# Patient Record
Sex: Female | Born: 1955
Health system: Southern US, Community
[De-identification: ages and names within clinical notes are randomized; demographics above are authoritative.]

## PROBLEM LIST (undated history)

## (undated) ENCOUNTER — Emergency Department (HOSPITAL_COMMUNITY): Admission: EM | Payer: Medicare Other | Source: Home / Self Care

## (undated) DIAGNOSIS — M549 Dorsalgia, unspecified: Secondary | ICD-10-CM

## (undated) DIAGNOSIS — I639 Cerebral infarction, unspecified: Secondary | ICD-10-CM

## (undated) DIAGNOSIS — M1990 Unspecified osteoarthritis, unspecified site: Secondary | ICD-10-CM

## (undated) DIAGNOSIS — E119 Type 2 diabetes mellitus without complications: Secondary | ICD-10-CM

## (undated) DIAGNOSIS — G8929 Other chronic pain: Secondary | ICD-10-CM

## (undated) DIAGNOSIS — F329 Major depressive disorder, single episode, unspecified: Secondary | ICD-10-CM

## (undated) DIAGNOSIS — F419 Anxiety disorder, unspecified: Secondary | ICD-10-CM

## (undated) DIAGNOSIS — I10 Essential (primary) hypertension: Secondary | ICD-10-CM

## (undated) DIAGNOSIS — K219 Gastro-esophageal reflux disease without esophagitis: Secondary | ICD-10-CM

## (undated) DIAGNOSIS — F0390 Unspecified dementia without behavioral disturbance: Secondary | ICD-10-CM

## (undated) DIAGNOSIS — G709 Myoneural disorder, unspecified: Secondary | ICD-10-CM

## (undated) DIAGNOSIS — IMO0001 Reserved for inherently not codable concepts without codable children: Secondary | ICD-10-CM

## (undated) DIAGNOSIS — F319 Bipolar disorder, unspecified: Secondary | ICD-10-CM

## (undated) DIAGNOSIS — F32A Depression, unspecified: Secondary | ICD-10-CM

## (undated) DIAGNOSIS — R51 Headache: Secondary | ICD-10-CM

## (undated) DIAGNOSIS — R519 Headache, unspecified: Secondary | ICD-10-CM

## (undated) HISTORY — DX: Headache: R51

## (undated) HISTORY — DX: Gastro-esophageal reflux disease without esophagitis: K21.9

## (undated) HISTORY — DX: Unspecified osteoarthritis, unspecified site: M19.90

## (undated) HISTORY — DX: Headache, unspecified: R51.9

## (undated) HISTORY — DX: Cerebral infarction, unspecified: I63.9

## (undated) HISTORY — PX: ABDOMINAL HYSTERECTOMY: SHX81

## (undated) HISTORY — PX: GANGLION CYST EXCISION: SHX1691

## (undated) HISTORY — DX: Other chronic pain: G89.29

## (undated) HISTORY — PX: TONSILLECTOMY: SUR1361

---

## 2007-03-16 ENCOUNTER — Encounter: Admission: RE | Admit: 2007-03-16 | Discharge: 2007-03-16 | Payer: Self-pay | Admitting: Occupational Medicine

## 2007-10-29 ENCOUNTER — Emergency Department: Payer: Self-pay | Admitting: Emergency Medicine

## 2007-11-18 ENCOUNTER — Encounter: Admission: RE | Admit: 2007-11-18 | Discharge: 2008-01-25 | Payer: Self-pay | Admitting: Internal Medicine

## 2008-02-10 ENCOUNTER — Encounter: Admission: RE | Admit: 2008-02-10 | Discharge: 2008-05-10 | Payer: Self-pay | Admitting: Internal Medicine

## 2009-06-27 ENCOUNTER — Ambulatory Visit (HOSPITAL_BASED_OUTPATIENT_CLINIC_OR_DEPARTMENT_OTHER)
Admission: RE | Admit: 2009-06-27 | Discharge: 2009-06-28 | Payer: Self-pay | Source: Home / Self Care | Admitting: Internal Medicine

## 2009-06-28 ENCOUNTER — Ambulatory Visit: Payer: Self-pay | Admitting: Diagnostic Radiology

## 2012-11-28 ENCOUNTER — Emergency Department (HOSPITAL_COMMUNITY)
Admission: EM | Admit: 2012-11-28 | Discharge: 2012-11-28 | Disposition: A | Discharge status: Home / Self Care | Payer: Self-pay | Attending: Emergency Medicine | Admitting: Emergency Medicine

## 2012-11-28 ENCOUNTER — Encounter (HOSPITAL_COMMUNITY): Payer: Self-pay | Admitting: Emergency Medicine

## 2012-11-28 DIAGNOSIS — Z203 Contact with and (suspected) exposure to rabies: Secondary | ICD-10-CM

## 2012-11-28 DIAGNOSIS — W540XXA Bitten by dog, initial encounter: Secondary | ICD-10-CM | POA: Insufficient documentation

## 2012-11-28 DIAGNOSIS — I10 Essential (primary) hypertension: Secondary | ICD-10-CM | POA: Insufficient documentation

## 2012-11-28 DIAGNOSIS — S81009A Unspecified open wound, unspecified knee, initial encounter: Secondary | ICD-10-CM | POA: Insufficient documentation

## 2012-11-28 DIAGNOSIS — Z23 Encounter for immunization: Secondary | ICD-10-CM | POA: Insufficient documentation

## 2012-11-28 DIAGNOSIS — Y9289 Other specified places as the place of occurrence of the external cause: Secondary | ICD-10-CM | POA: Insufficient documentation

## 2012-11-28 DIAGNOSIS — S81852A Open bite, left lower leg, initial encounter: Secondary | ICD-10-CM

## 2012-11-28 DIAGNOSIS — Y9389 Activity, other specified: Secondary | ICD-10-CM | POA: Insufficient documentation

## 2012-11-28 HISTORY — DX: Essential (primary) hypertension: I10

## 2012-11-28 MED ORDER — RABIES IMMUNE GLOBULIN 150 UNIT/ML IM INJ
20.0000 [IU]/kg | INJECTION | Freq: Once | INTRAMUSCULAR | Status: AC
  Administered 2012-11-28: 2325 [IU] via INTRAMUSCULAR
  Filled 2012-11-28: qty 15.5

## 2012-11-28 MED ORDER — RABIES VACCINE, PCEC IM SUSR
1.0000 mL | Freq: Once | INTRAMUSCULAR | Status: AC
  Administered 2012-11-28: 1 mL via INTRAMUSCULAR
  Filled 2012-11-28: qty 1

## 2012-11-28 MED ORDER — TETANUS-DIPHTH-ACELL PERTUSSIS 5-2.5-18.5 LF-MCG/0.5 IM SUSP
0.5000 mL | Freq: Once | INTRAMUSCULAR | Status: AC
  Administered 2012-11-28: 0.5 mL via INTRAMUSCULAR
  Filled 2012-11-28: qty 0.5

## 2012-11-28 NOTE — ED Provider Notes (Signed)
Medical screening examination/treatment/procedure(s) were performed by non-physician practitioner and as supervising physician I was immediately available for consultation/collaboration.  EKG Interpretation   None        Geoffery Lyons, MD 11/28/12 1540

## 2012-11-28 NOTE — ED Notes (Signed)
She states she was "nipped" by a dog.  She states, as was verified by EMT, that police were notified; and will come to hospital to make a report.  She states the dog is a small dog, which is a family pet--not a stray.  She has sm. Red line (no broken skin) on back of lat. left calf.  She is in no distress.

## 2012-11-28 NOTE — ED Notes (Signed)
She reports no itching, nor any other reaction to injections.  She states police were notified, and were at scene at [facility] where this occurred.  She states she has no address or phone to give animal control, "but they know who I am and where to find me".

## 2012-11-28 NOTE — ED Provider Notes (Signed)
CSN: [id number]     Arrival date & time 11/28/12  1102 History   First MD Initiated Contact with Patient 11/28/12 1109     Chief Complaint  Patient presents with  . Animal Bite   (Consider location/radiation/quality/duration/timing/severity/associated sxs/prior Treatment) HPI Comments: Patient here after having been bit by unknown dog while working in the neighborhood.  She reports that this unprovoked attack, she reports being nipped on the left lateral calf.  No bleeding present, mild swelling at the site with small puncture marks.  She does not know her tetanus status.  Patient is a 57 y.o. female presenting with animal bite. The history is provided by the patient. No language interpreter was used.  Animal Bite Contact animal:  Dog Location:  Leg Leg injury location:  L lower leg Time since incident:  1 hour Pain details:    Quality:  Aching   Severity:  Mild   Timing:  Constant Incident location:  Outside Provoked: unprovoked   Notifications:  Animal control and law enforcement Animal's rabies vaccination status:  Unknown Animal in possession: no   Tetanus status:  Out of date Relieved by:  Nothing Worsened by:  Nothing tried Ineffective treatments:  None tried Associated symptoms: swelling   Associated symptoms: no fever, no numbness and no rash     Past Medical History  Diagnosis Date  . Hypertension    History reviewed. No pertinent past surgical history. History reviewed. No pertinent family history. History  Substance Use Topics  . Smoking status: Never Smoker   . Smokeless tobacco: Not on file  . Alcohol Use: No   OB History   Grav Para Term Preterm Abortions TAB SAB Ect Mult Living                 Review of Systems  Constitutional: Negative for fever.  Skin: Negative for rash.  Neurological: Negative for numbness.  All other systems reviewed and are negative.    Allergies  Prednisone  Home Medications  No current outpatient prescriptions on  file. BP 134/88  Pulse 91  Temp(Src) 98.3 F (36.8 C) (Oral)  Resp 20  SpO2 96% Physical Exam  Nursing note and vitals reviewed. Constitutional: She is oriented to person, place, and time. She appears well-developed and well-nourished. No distress.  HENT:  Head: Normocephalic and atraumatic.  Mouth/Throat: Oropharynx is clear and moist.  Eyes: Conjunctivae are normal. No scleral icterus.  Neck: Normal range of motion. Neck supple.  Pulmonary/Chest: Effort normal.  Musculoskeletal: Normal range of motion. She exhibits edema and tenderness.  1cm area of induration to left lateral calf, two very small puncture marks noted, no erythema, no streaking, no bleeding.  Lymphadenopathy:    She has no cervical adenopathy.  Neurological: She is alert and oriented to person, place, and time. She exhibits normal muscle tone. Coordination normal.  Skin: Skin is warm and dry. No rash noted. No erythema. No pallor.  Psychiatric: She has a normal mood and affect. Her behavior is normal. Judgment and thought content normal.    ED Course  Procedures (including critical care time) Labs Review Labs Reviewed - No data to display Imaging Review No results found.  EKG Interpretation   None     11:36 AM Patient insistent that dog is unknown to her, animal control has been notified and police notified.  Patient would like to proceed with rabies series.  MDM  Dog bite  Patient here with dog bite, small wound with only puncture marks, patient requesting  rabies series, she reports dog unknown, doubt infection possible at the wound site.   Izola Price Marisue Humble, PA-C 11/28/12 1224

## 2012-12-01 ENCOUNTER — Emergency Department (INDEPENDENT_AMBULATORY_CARE_PROVIDER_SITE_OTHER)
Admission: EM | Admit: 2012-12-01 | Discharge: 2012-12-01 | Disposition: A | Discharge status: Home / Self Care | Payer: Self-pay | Source: Home / Self Care

## 2012-12-01 ENCOUNTER — Encounter (HOSPITAL_COMMUNITY): Payer: Self-pay | Admitting: Emergency Medicine

## 2012-12-01 DIAGNOSIS — Z203 Contact with and (suspected) exposure to rabies: Secondary | ICD-10-CM

## 2012-12-01 MED ORDER — RABIES VACCINE, PCEC IM SUSR
INTRAMUSCULAR | Status: AC
  Filled 2012-12-01: qty 1

## 2012-12-01 MED ORDER — RABIES VACCINE, PCEC IM SUSR
1.0000 mL | Freq: Once | INTRAMUSCULAR | Status: AC
  Administered 2012-12-01: 1 mL via INTRAMUSCULAR

## 2012-12-01 NOTE — ED Notes (Signed)
Pt  Here  For   Scheduled  Rabies  Vaccine        She  Reports  No   Symptoms

## 2012-12-05 ENCOUNTER — Emergency Department (INDEPENDENT_AMBULATORY_CARE_PROVIDER_SITE_OTHER)
Admission: EM | Admit: 2012-12-05 | Discharge: 2012-12-05 | Disposition: A | Discharge status: Home / Self Care | Payer: Self-pay | Source: Home / Self Care

## 2012-12-05 ENCOUNTER — Encounter (HOSPITAL_COMMUNITY): Payer: Self-pay | Admitting: Emergency Medicine

## 2012-12-05 DIAGNOSIS — Z203 Contact with and (suspected) exposure to rabies: Secondary | ICD-10-CM

## 2012-12-05 DIAGNOSIS — R002 Palpitations: Secondary | ICD-10-CM

## 2012-12-05 MED ORDER — RABIES VACCINE, PCEC IM SUSR
1.0000 mL | Freq: Once | INTRAMUSCULAR | Status: AC
  Administered 2012-12-05: 1 mL via INTRAMUSCULAR

## 2012-12-05 MED ORDER — RABIES VACCINE, PCEC IM SUSR
INTRAMUSCULAR | Status: AC
  Filled 2012-12-05: qty 1

## 2012-12-05 NOTE — ED Notes (Signed)
Pt presents for rabies vaccine.  States she has been experiencing a chest "fluttering" constantly since 10/28 without chest pain, but occasionally feels nausea.  Denies SOB.  Skin W/D.  Also c/o back pain.  Denies any cardiac hx.

## 2012-12-05 NOTE — ED Provider Notes (Signed)
Medical screening examination/treatment/procedure(s) were performed by non-physician practitioner and as supervising physician I was immediately available for consultation/collaboration.  Leslee Home, M.D.  Reuben Likes, MD 12/05/12 [phone]

## 2012-12-05 NOTE — ED Provider Notes (Signed)
CSN: [id number]     Arrival date & time 12/05/12  1914 History   None    Chief Complaint  Patient presents with  . Palpitations  . Rabies Injection   (Consider location/radiation/quality/duration/timing/severity/associated sxs/prior Treatment) HPI Comments: Lorraine Ryan presents today for rabies vaccination #2. Since her initial injection she has been having mild palpitations that "come and go". Denies chest pain, dizziness or near syncope. Mild SOB at times that comes and goes, no diaphoresis or N,V. She carries no heart history.   Patient is a 57 y.o. female presenting with palpitations. The history is provided by the patient.  Palpitations Associated symptoms: no chest pain and no diaphoresis     Past Medical History  Diagnosis Date  . Hypertension     No meds prescribed; states intermittent   Past Surgical History  Procedure Laterality Date  . Ganglion cyst excision    . Abdominal hysterectomy     No family history on file. History  Substance Use Topics  . Smoking status: Never Smoker   . Smokeless tobacco: Not on file  . Alcohol Use: No   OB History   Grav Para Term Preterm Abortions TAB SAB Ect Mult Living                 Review of Systems  Constitutional: Negative for diaphoresis and fatigue.  HENT: Negative.   Respiratory: Negative for chest tightness and wheezing.   Cardiovascular: Positive for palpitations. Negative for chest pain and leg swelling.  Gastrointestinal: Negative.   Allergic/Immunologic: Negative.   Neurological: Negative.     Allergies  Codeine; Oxycodone; and Prednisone  Home Medications   Current Outpatient Rx  Name  Route  Sig  Dispense  Refill  . RABIES VACCINE, [facility]C IM   Intramuscular   Inject into the muscle.          BP 130/88  Pulse 72  Temp(Src) 97.4 F (36.3 C) (Oral)  Resp 16  SpO2 98% Physical Exam  Nursing note and vitals reviewed. Constitutional: She appears well-developed and well-nourished. No distress.   HENT:  Head: Normocephalic and atraumatic.  Neck: Normal range of motion.  Cardiovascular: Normal rate, regular rhythm and normal heart sounds.   No murmur heard. Pulmonary/Chest: Effort normal and breath sounds normal. No respiratory distress. She has no wheezes. She has no rales.  Musculoskeletal: Normal range of motion. She exhibits no edema.  Neurological: She is alert.  Skin: Skin is warm and dry. She is not diaphoretic.  Psychiatric: Her behavior is normal.    ED Course  Procedures (including critical care time) Labs Review Labs Reviewed - No data to display Imaging Review No results found.  EKG Interpretation     Ventricular Rate:    PR Interval:    QRS Duration:   QT Interval:    QTC Calculation:   R Axis:     Text Interpretation:              MDM   1. Need for post exposure prophylaxis for rabies   2. Heart palpitations   EKG performed in setting of palpitations. Reviewed by Dr. Lorenz Coaster. Normal SR. Vitals and exam stable. Palpitations are a side effect of vaccine. However the benefit of being vaccinated highly outweigh the risk. Therefore per Dr. Lorenz Coaster proceed with vaccine, and if she worsens present to the ER for work up. Otherwise if continued Palpitation referral give for [facility] with number to contact for an appt. She is educated today and expresses understanding.  Lorraine Sheer, PA-C 12/05/12 [phone]

## 2012-12-12 ENCOUNTER — Emergency Department (INDEPENDENT_AMBULATORY_CARE_PROVIDER_SITE_OTHER)
Admission: EM | Admit: 2012-12-12 | Discharge: 2012-12-12 | Disposition: A | Discharge status: Home / Self Care | Payer: Self-pay | Source: Home / Self Care

## 2012-12-12 ENCOUNTER — Encounter (HOSPITAL_COMMUNITY): Payer: Self-pay | Admitting: Emergency Medicine

## 2012-12-12 DIAGNOSIS — Z203 Contact with and (suspected) exposure to rabies: Secondary | ICD-10-CM

## 2012-12-12 MED ORDER — RABIES VACCINE, PCEC IM SUSR
INTRAMUSCULAR | Status: AC
  Filled 2012-12-12: qty 1

## 2012-12-12 MED ORDER — RABIES VACCINE, PCEC IM SUSR
1.0000 mL | Freq: Once | INTRAMUSCULAR | Status: AC
  Administered 2012-12-12: 1 mL via INTRAMUSCULAR

## 2012-12-12 NOTE — ED Notes (Addendum)
Pt  Is  Here  For  The  Last  Of  Her  Rabies  Series     She    Voices  No  Complaints

## 2012-12-27 ENCOUNTER — Emergency Department (HOSPITAL_BASED_OUTPATIENT_CLINIC_OR_DEPARTMENT_OTHER): Payer: Self-pay

## 2012-12-27 ENCOUNTER — Encounter (HOSPITAL_BASED_OUTPATIENT_CLINIC_OR_DEPARTMENT_OTHER): Payer: Self-pay | Admitting: Emergency Medicine

## 2012-12-27 ENCOUNTER — Emergency Department (HOSPITAL_BASED_OUTPATIENT_CLINIC_OR_DEPARTMENT_OTHER)
Admission: EM | Admit: 2012-12-27 | Discharge: 2012-12-27 | Disposition: A | Discharge status: Home / Self Care | Payer: Self-pay | Attending: Emergency Medicine | Admitting: Emergency Medicine

## 2012-12-27 DIAGNOSIS — Z87891 Personal history of nicotine dependence: Secondary | ICD-10-CM | POA: Insufficient documentation

## 2012-12-27 DIAGNOSIS — R0789 Other chest pain: Secondary | ICD-10-CM

## 2012-12-27 DIAGNOSIS — R071 Chest pain on breathing: Secondary | ICD-10-CM | POA: Insufficient documentation

## 2012-12-27 DIAGNOSIS — I10 Essential (primary) hypertension: Secondary | ICD-10-CM | POA: Insufficient documentation

## 2012-12-27 LAB — COMPREHENSIVE METABOLIC PANEL
ALT: 18 U/L (ref 0–35)
AST: 20 U/L (ref 0–37)
Albumin: 3.6 g/dL (ref 3.5–5.2)
Calcium: 9.2 mg/dL (ref 8.4–10.5)
Creatinine, Ser: 0.7 mg/dL (ref 0.50–1.10)
GFR calc Af Amer: >90 mL/min (ref >90)
Sodium: 141 mEq/L (ref 135–145)
Total Protein: 7.3 g/dL (ref 6.0–8.3)

## 2012-12-27 LAB — D-DIMER, QUANTITATIVE: D-Dimer, Quant: 0.56 ug/mL-FEU — ABNORMAL HIGH (ref 0.00–0.48)

## 2012-12-27 LAB — CBC
Hemoglobin: 14 g/dL (ref 12.0–15.0)
MCHC: 33.3 g/dL (ref 30.0–36.0)
MCV: 89.6 fL (ref 78.0–100.0)
Platelets: 201 10*3/uL (ref 150–400)
RBC: 4.69 MIL/uL (ref 3.87–5.11)

## 2012-12-27 LAB — TROPONIN I: Troponin I: <0.3 ng/mL (ref <0.30)

## 2012-12-27 MED ORDER — IOHEXOL 350 MG/ML SOLN
100.0000 mL | Freq: Once | INTRAVENOUS | Status: AC | PRN
  Administered 2012-12-27: 100 mL via INTRAVENOUS

## 2012-12-27 MED ORDER — KETOROLAC TROMETHAMINE 30 MG/ML IJ SOLN
INTRAMUSCULAR | Status: AC
  Filled 2012-12-27: qty 1

## 2012-12-27 MED ORDER — KETOROLAC TROMETHAMINE 30 MG/ML IJ SOLN
30.0000 mg | Freq: Once | INTRAMUSCULAR | Status: AC
  Administered 2012-12-27: 30 mg via INTRAVENOUS

## 2012-12-27 MED ORDER — GI COCKTAIL ~~LOC~~
30.0000 mL | Freq: Once | ORAL | Status: AC
  Administered 2012-12-27: 30 mL via ORAL
  Filled 2012-12-27: qty 30

## 2012-12-27 MED ORDER — NAPROXEN 375 MG PO TABS: 375.0000 mg | ORAL_TABLET | Freq: Two times a day (BID) | ORAL | Status: DC

## 2012-12-27 NOTE — ED Notes (Signed)
Pt c/o heart fluttering - states she has had this for 3 weeks - but it has worsened today.

## 2012-12-27 NOTE — ED Notes (Signed)
States she has discomfort on her left chest for 3 weeks - states recently given Rabies vaccines.

## 2012-12-27 NOTE — ED Provider Notes (Signed)
CSN: [id number]     Arrival date & time 12/27/12  0425 History   First MD Initiated Contact with Patient 12/27/12 0441     Chief Complaint  Patient presents with  . Palpitations   (Consider location/radiation/quality/duration/timing/severity/associated sxs/prior Treatment) Patient is a 57 y.o. female presenting with chest pain. The history is provided by the patient. No language interpreter was used.  Chest Pain Pain location:  L chest Pain quality: dull   Pain radiates to:  Does not radiate Pain radiates to the back: no   Pain severity:  Moderate Onset quality:  Gradual Duration:  3 weeks Timing:  Constant Progression:  Unchanged Chronicity:  New Context: eating   Relieved by:  Nothing Worsened by:  Nothing tried Ineffective treatments:  None tried Associated symptoms: palpitations   Associated symptoms: not vomiting   Risk factors: no smoking     Past Medical History  Diagnosis Date  . Hypertension     No meds prescribed; states intermittent   Past Surgical History  Procedure Laterality Date  . Ganglion cyst excision    . Abdominal hysterectomy     No family history on file. History  Substance Use Topics  . Smoking status: Former Games developer  . Smokeless tobacco: Never Used  . Alcohol Use: No   OB History   Grav Para Term Preterm Abortions TAB SAB Ect Mult Living                 Review of Systems  Cardiovascular: Positive for chest pain and palpitations.  Gastrointestinal: Negative for vomiting.  All other systems reviewed and are negative.    Allergies  Codeine; Oxycodone; and Prednisone  Home Medications   Current Outpatient Rx  Name  Route  Sig  Dispense  Refill  . RABIES VACCINE, PCEC IM   Intramuscular   Inject into the muscle.          BP 143/91  Pulse 73  Temp(Src) 97.8 F (36.6 C) (Oral)  Resp 16  Ht 5\' 6"  (1.676 m)  Wt 250 lb (113.399 kg)  BMI 40.37 kg/m2  SpO2 96% Physical Exam  Constitutional: She is oriented to person, place,  and time. She appears well-developed and well-nourished. No distress.  HENT:  Head: Normocephalic and atraumatic.  Mouth/Throat: Oropharynx is clear and moist.  Eyes: Conjunctivae are normal. Pupils are equal, round, and reactive to light.  Neck: Normal range of motion. Neck supple.  Cardiovascular: Normal rate, regular rhythm and intact distal pulses.   Pulmonary/Chest: Effort normal and breath sounds normal. She has no wheezes. She has no rales.  Abdominal: Soft. Bowel sounds are normal. There is no tenderness. There is no rebound and no guarding.  Musculoskeletal: Normal range of motion.  Neurological: She is alert and oriented to person, place, and time.  Skin: Skin is warm and dry. She is not diaphoretic.  Psychiatric: Thought content normal.    ED Course  Procedures (including critical care time) Labs Review Labs Reviewed  CBC  TROPONIN I  COMPREHENSIVE METABOLIC PANEL  LIPASE, BLOOD  D-DIMER, QUANTITATIVE   Imaging Review No results found.  EKG Interpretation    Date/Time:  Sunday December 27 2012 04:31:39 EST Ventricular Rate:  85 PR Interval:  150 QRS Duration: 94 QT Interval:  402 QTC Calculation: 478 R Axis:   19 Text Interpretation:  Normal sinus rhythm Possible Left atrial enlargement Confirmed by [facility]  MD, Zander Ingham ([id number]) on 12/27/2012 4:41:29 AM  MDM  Symptoms constant for 3 weeks.  Patient stated during the evaluation that the pain was worse with movement or palpation of the area.  In the setting of > 8 hours of constant chest pain with negative EKG and troponin ACS is excluded.      Jasmine Awe, MD 12/27/12 [phone]

## 2012-12-29 ENCOUNTER — Encounter (HOSPITAL_BASED_OUTPATIENT_CLINIC_OR_DEPARTMENT_OTHER): Payer: Self-pay | Admitting: Emergency Medicine

## 2012-12-29 ENCOUNTER — Emergency Department (HOSPITAL_BASED_OUTPATIENT_CLINIC_OR_DEPARTMENT_OTHER)
Admission: EM | Admit: 2012-12-29 | Discharge: 2012-12-29 | Disposition: A | Discharge status: Home / Self Care | Payer: Self-pay | Attending: Emergency Medicine | Admitting: Emergency Medicine

## 2012-12-29 ENCOUNTER — Emergency Department (HOSPITAL_BASED_OUTPATIENT_CLINIC_OR_DEPARTMENT_OTHER): Payer: Self-pay

## 2012-12-29 DIAGNOSIS — I10 Essential (primary) hypertension: Secondary | ICD-10-CM | POA: Insufficient documentation

## 2012-12-29 DIAGNOSIS — M545 Low back pain, unspecified: Secondary | ICD-10-CM | POA: Insufficient documentation

## 2012-12-29 DIAGNOSIS — Z87891 Personal history of nicotine dependence: Secondary | ICD-10-CM | POA: Insufficient documentation

## 2012-12-29 DIAGNOSIS — R071 Chest pain on breathing: Secondary | ICD-10-CM | POA: Insufficient documentation

## 2012-12-29 DIAGNOSIS — R0602 Shortness of breath: Secondary | ICD-10-CM | POA: Insufficient documentation

## 2012-12-29 DIAGNOSIS — M25519 Pain in unspecified shoulder: Secondary | ICD-10-CM | POA: Insufficient documentation

## 2012-12-29 DIAGNOSIS — R0789 Other chest pain: Secondary | ICD-10-CM

## 2012-12-29 HISTORY — DX: Dorsalgia, unspecified: M54.9

## 2012-12-29 LAB — TROPONIN I: Troponin I: <0.3 ng/mL (ref <0.30)

## 2012-12-29 MED ORDER — IBUPROFEN 800 MG PO TABS: 800.0000 mg | ORAL_TABLET | Freq: Three times a day (TID) | ORAL | Status: DC

## 2012-12-29 NOTE — ED Notes (Signed)
Pt reports a one month history of chest pain described as sharp, a one year history of back pain. Pt also reports neck pain that started one month ago, as well as right arm and right leg pain.  She was seen in ED 2 days ago for the same.

## 2012-12-29 NOTE — ED Provider Notes (Signed)
CSN: [id number]     Arrival date & time 12/29/12  1012 History   First MD Initiated Contact with Patient 12/29/12 1018     Chief Complaint  Patient presents with  . Chest Pain  . Back Pain  . Arm Pain  . Leg Pain   (Consider location/radiation/quality/duration/timing/severity/associated sxs/prior Treatment) Patient is a 57 y.o. female presenting with chest pain, back pain, arm pain, and leg pain. The history is provided by the patient.  Chest Pain Pain location:  L chest Pain quality comment:  Cramping Pain radiates to:  Does not radiate Pain radiates to the back: no   Pain severity:  Mild Onset quality:  Gradual Duration:  1 month Timing:  Constant Progression:  Unchanged Chronicity:  New Context: raising an arm   Context: not breathing and no drug use   Relieved by:  Nothing Worsened by:  Nothing tried Ineffective treatments:  None tried Associated symptoms: back pain and shortness of breath   Associated symptoms: no abdominal pain, no cough, no fever and not vomiting   Back Pain Associated symptoms: chest pain and leg pain   Associated symptoms: no abdominal pain and no fever   Arm Pain Associated symptoms include chest pain and shortness of breath. Pertinent negatives include no abdominal pain.  Leg Pain Associated symptoms: back pain   Associated symptoms: no fever     Past Medical History  Diagnosis Date  . Hypertension     No meds prescribed; states intermittent  . Back pain    Past Surgical History  Procedure Laterality Date  . Ganglion cyst excision    . Abdominal hysterectomy     No family history on file. History  Substance Use Topics  . Smoking status: Former Games developer  . Smokeless tobacco: Never Used  . Alcohol Use: No   OB History   Grav Para Term Preterm Abortions TAB SAB Ect Mult Living                 Review of Systems  Constitutional: Negative for fever.  Respiratory: Positive for shortness of breath. Negative for cough.    Cardiovascular: Positive for chest pain. Negative for leg swelling.  Gastrointestinal: Negative for vomiting and abdominal pain.  Musculoskeletal: Positive for back pain.  All other systems reviewed and are negative.    Allergies  Codeine; Oxycodone; and Prednisone  Home Medications   Current Outpatient Rx  Name  Route  Sig  Dispense  Refill  . naproxen (NAPROSYN) 375 MG tablet   Oral   Take 1 tablet (375 mg total) by mouth 2 (two) times daily.   20 tablet   0   . RABIES VACCINE, PCEC IM   Intramuscular   Inject into the muscle.          There were no vitals taken for this visit. Physical Exam  Nursing note and vitals reviewed. Constitutional: She is oriented to person, place, and time. She appears well-developed and well-nourished. No distress.  HENT:  Head: Normocephalic and atraumatic.  Eyes: EOM are normal. Pupils are equal, round, and reactive to light.  Neck: Normal range of motion. Neck supple.  Cardiovascular: Normal rate and regular rhythm.  Exam reveals no friction rub.   No murmur heard. Pulmonary/Chest: Effort normal and breath sounds normal. No respiratory distress. She has no wheezes. She has no rales. She exhibits tenderness (L anterior, superior).  Abdominal: Soft. She exhibits no distension. There is no tenderness. There is no rebound.  Musculoskeletal: Normal range of  motion. She exhibits no edema.       Left shoulder: She exhibits tenderness (medial deltiod). She exhibits normal range of motion.  Neurological: She is alert and oriented to person, place, and time.  Skin: She is not diaphoretic.    ED Course  Procedures (including critical care time) Labs Review Labs Reviewed  TROPONIN I   Imaging Review Dg Chest 2 View  12/29/2012   CLINICAL DATA:  Chest pain for 1 month.  EXAM: CHEST  2 VIEW  COMPARISON:  PA and lateral chest and CT chest 12/27/2012.  FINDINGS: Lungs are clear. Heart size is normal. No pneumothorax or pleural effusion.   IMPRESSION: No acute disease.   Electronically Signed   By: Drusilla Kanner M.D.   On: 12/29/2012 11:05    EKG Interpretation    Date/Time:  Tuesday December 29 2012 10:18:05 EST Ventricular Rate:  87 PR Interval:  146 QRS Duration: 94 QT Interval:  380 QTC Calculation: 457 R Axis:   21 Text Interpretation:  Normal sinus rhythm Normal ECG Similar to prior EKG Confirmed by Gwendolyn Grant  MD, Rashaun Curl ([id number]) on 12/29/2012 10:22:13 AM            MDM   1. Chest wall pain    57 year old here for chest pain. She states is been constant for the past month. States feels like cramping she cannot move her left arm due to this. It does not radiate. It is not pleuritic. She says she's been short of breath this entire time with this chest pain. She denies any productive cough, hemoptysis. She has no trouble breathing clinically here on exam. Her vitals are stable. She also is complaining of lower back pain has been gone for one year. On exam, she has her left arm from [place] her chest feels like it is cramping. She has reproducible chest pain with palpation on anterior chest wall. She is not decreased range of motion in her shoulder, but does not use it due to pain in her chest. I believe her chest is musculoskeletal in nature. Her EKG is normal. I will check once the troponin since been constant for the past month. Also check a chest x-ray. For her lower back pain, she has no midline tenderness. She has normal strength and sensation in her lower extremities. I feel like her back pain is a chronic problem, and it has been going on for a long time. She will f/u with her PCP for this. EKG normal. Troponin and CXR normal. Stable for discharge.     Dagmar Hait, MD 12/31/12 [phone]

## 2012-12-29 NOTE — ED Notes (Signed)
Patient declines wheelchair to treatment room.  RN & RT at bedside for triage.  EKG done during triage, shown & signed by Dr. Gwendolyn Grant.  Patient changed into gown, waist up.

## 2013-01-13 ENCOUNTER — Encounter: Payer: Self-pay | Admitting: Internal Medicine

## 2013-01-13 ENCOUNTER — Ambulatory Visit: Payer: Self-pay | Attending: Internal Medicine | Admitting: Internal Medicine

## 2013-01-13 VITALS — BP 138/85 | HR 84 | Temp 98.7°F | Resp 14 | Ht 66.0 in | Wt 260.0 lb

## 2013-01-13 DIAGNOSIS — R079 Chest pain, unspecified: Secondary | ICD-10-CM | POA: Insufficient documentation

## 2013-01-13 DIAGNOSIS — M549 Dorsalgia, unspecified: Secondary | ICD-10-CM

## 2013-01-13 DIAGNOSIS — Z7689 Persons encountering health services in other specified circumstances: Secondary | ICD-10-CM

## 2013-01-13 DIAGNOSIS — Z7189 Other specified counseling: Secondary | ICD-10-CM

## 2013-01-13 DIAGNOSIS — N644 Mastodynia: Secondary | ICD-10-CM

## 2013-01-13 LAB — LIPID PANEL
Cholesterol: 225 mg/dL — ABNORMAL HIGH (ref 0–200)
HDL: 62 mg/dL (ref >39)
Total CHOL/HDL Ratio: 3.6 Ratio

## 2013-01-13 LAB — TSH: TSH: 1.184 u[IU]/mL (ref 0.350–4.500)

## 2013-01-13 MED ORDER — CYCLOBENZAPRINE HCL 5 MG PO TABS: 5.0000 mg | ORAL_TABLET | Freq: Three times a day (TID) | ORAL | Status: DC | PRN

## 2013-01-13 MED ORDER — GABAPENTIN 100 MG PO CAPS: 100.0000 mg | ORAL_CAPSULE | Freq: Three times a day (TID) | ORAL | Status: DC

## 2013-01-13 NOTE — Progress Notes (Signed)
Pt is here to establish care. Complains of back pain x1 year, chest pain, Rt leg pain x4 months. Pain on a level 10 today. Requests to see a doctor for pain; having trouble sleeping due to pain. Pain in upper Rt arm and neck pain x4 months. Chronic pain and lumbar sprain x1 year. Chronic headaches x6 months.

## 2013-01-13 NOTE — Progress Notes (Signed)
Patient ID: Lorraine Ryan, female   DOB: 05/27/1955, 57 y.o.   MRN: [id number]  CC:  HPI: 57 year old female who is here for establishing care. She has multiple complaints she is back pain for one year pain is in her lower back radiating into her right leg. Worse with prolonged sitting and ambulating. She also has chest pain that started 2 months ago after a rabies shot. Chest pain is mostly located under her left breast. Easily reproducible with the stethoscope. Exacerbated with movement. Denies shortness of breath  She was recently in the ER and had a CT angiogram done on 11/14 to rule out pulmonary embolism and it was negative. She also had an EKG that showed normal sinus rhythm with a ventricular rate of 87.  She has not seen a primary care provider in several years. She does not recall when was her last mammogram or Pap smear     Allergies  Allergen Reactions  . Codeine     HA  . Oxycodone Nausea Only and Palpitations  . Prednisone Other (See Comments)    Headache   Past Medical History  Diagnosis Date  . Hypertension     No meds prescribed; states intermittent  . Back pain   . Stroke     caused numbness in leg   Current Outpatient Prescriptions on File Prior to Visit  Medication Sig Dispense Refill  . ibuprofen (ADVIL,MOTRIN) 800 MG tablet Take 1 tablet (800 mg total) by mouth 3 (three) times daily.  21 tablet  0  . naproxen (NAPROSYN) 375 MG tablet Take 1 tablet (375 mg total) by mouth 2 (two) times daily.  20 tablet  0  . RABIES VACCINE, PCEC IM Inject into the muscle.       No current facility-administered medications on file prior to visit.   History reviewed. No pertinent family history. History   Social History  . Marital Status: Divorced    Spouse Name: N/A    Number of Children: N/A  . Years of Education: N/A   Occupational History  . Not on file.   Social History Main Topics  . Smoking status: Former Games developer  . Smokeless tobacco: Never Used  .  Alcohol Use: No  . Drug Use: No  . Sexual Activity: Not on file   Other Topics Concern  . Not on file   Social History Narrative  . No narrative on file    Review of Systems  Constitutional: Negative for fever, chills, diaphoresis, activity change, appetite change and fatigue.  HENT: Negative for ear pain, nosebleeds, congestion, facial swelling, rhinorrhea, neck pain, neck stiffness and ear discharge.   Eyes: Negative for pain, discharge, redness, itching and visual disturbance.  Respiratory: Negative for cough, choking, chest tightness, shortness of breath, wheezing and stridor.   Cardiovascular: Negative for chest pain, palpitations and leg swelling.  Gastrointestinal: Negative for abdominal distention.  Genitourinary: Negative for dysuria, urgency, frequency, hematuria, flank pain, decreased urine volume, difficulty urinating and dyspareunia.  Musculoskeletal: Negative for back pain, joint swelling, arthralgias and gait problem.  Neurological: Negative for dizziness, tremors, seizures, syncope, facial asymmetry, speech difficulty, weakness, light-headedness, numbness and headaches.  Hematological: Negative for adenopathy. Does not bruise/bleed easily.  Psychiatric/Behavioral: Negative for hallucinations, behavioral problems, confusion, dysphoric mood, decreased concentration and agitation.    Objective:   Filed Vitals:   01/13/13 1120  BP: 138/85  Pulse: 84  Temp: 98.7 F (37.1 C)  Resp: 14    Physical Exam  Constitutional: Appears well-developed and  well-nourished. No distress.  HENT: Normocephalic. External right and left ear normal. Oropharynx is clear and moist.  Eyes: Conjunctivae and EOM are normal. PERRLA, no scleral icterus.  Neck: Normal ROM. Neck supple. No JVD. No tracheal deviation. No thyromegaly.  CVS: RRR, S1/S2 +, no murmurs, no gallops, no carotid bruit.  Pulmonary: Effort and breath sounds normal, no stridor, rhonchi, wheezes, rales.  Abdominal: Soft.  BS +,  no distension, tenderness, rebound or guarding.  Musculoskeletal: Normal range of motion. No edema and no tenderness.  Lymphadenopathy: No lymphadenopathy noted, cervical, inguinal. Neuro: Alert. Normal reflexes, muscle tone coordination. No cranial nerve deficit. Skin: Skin is warm and dry. No rash noted. Not diaphoretic. No erythema. No pallor.  Psychiatric: Normal mood and affect. Behavior, judgment, thought content normal.   Lab Results  Component Value Date   WBC 4.4 12/27/2012   HGB 14.0 12/27/2012   HCT 42.0 12/27/2012   MCV 89.6 12/27/2012   PLT 201 12/27/2012   Lab Results  Component Value Date   CREATININE 0.70 12/27/2012   BUN 14 12/27/2012   NA 141 12/27/2012   K 3.3* 12/27/2012   CL 105 12/27/2012   CO2 24 12/27/2012    No results found for this basename: HGBA1C   Lipid Panel  No results found for this basename: chol, trig, hdl, cholhdl, vldl, ldlcalc       Assessment and plan:   There are no active problems to display for this patient.      Chest pain Chronic present for 2 months Negative troponin in the ED, Negative CTA Will order a 2-D echo If normal no further testing indicated However chest pain is persistent, will refer to cardiology for further workup  Back pain We'll schedule the patient for MRI of the back Prescribe patient Flexeril and gabapentin Patient advised not to drive after taking these medications   Establish care Gynecologic referral Pap smear Schedule the patient for a mammogram Flu vaccine will be provided   The patient was given clear instructions to go to ER or return to medical center if symptoms don't improve, worsen or new problems develop. The patient verbalized understanding. The patient was told to call to get any lab results if not heard anything in the next week.

## 2013-01-14 LAB — VITAMIN D 25 HYDROXY (VIT D DEFICIENCY, FRACTURES): Vit D, 25-Hydroxy: 19 ng/mL — ABNORMAL LOW (ref 30–89)

## 2013-01-18 LAB — CK ISOENZYMES
CK-BB: 0 %
CK-MB: 0 % (ref <5)
CK-MM: 100 % (ref 95–100)
Creatine Kinase, Total: 348 U/L — ABNORMAL HIGH (ref 29–143)

## 2013-01-22 ENCOUNTER — Ambulatory Visit (HOSPITAL_COMMUNITY): Admission: RE | Admit: 2013-01-22 | Payer: Self-pay | Source: Ambulatory Visit

## 2013-01-26 ENCOUNTER — Ambulatory Visit (HOSPITAL_COMMUNITY): Payer: Self-pay | Attending: Internal Medicine

## 2013-02-05 ENCOUNTER — Ambulatory Visit (HOSPITAL_COMMUNITY)
Admission: RE | Admit: 2013-02-05 | Discharge: 2013-02-05 | Disposition: A | Discharge status: Home / Self Care | Payer: Self-pay | Source: Ambulatory Visit | Attending: Internal Medicine | Admitting: Internal Medicine

## 2013-02-05 DIAGNOSIS — Z8673 Personal history of transient ischemic attack (TIA), and cerebral infarction without residual deficits: Secondary | ICD-10-CM | POA: Insufficient documentation

## 2013-02-05 DIAGNOSIS — Z87891 Personal history of nicotine dependence: Secondary | ICD-10-CM | POA: Insufficient documentation

## 2013-02-05 DIAGNOSIS — I10 Essential (primary) hypertension: Secondary | ICD-10-CM | POA: Insufficient documentation

## 2013-02-05 DIAGNOSIS — N644 Mastodynia: Secondary | ICD-10-CM

## 2013-02-05 DIAGNOSIS — R079 Chest pain, unspecified: Secondary | ICD-10-CM | POA: Insufficient documentation

## 2013-02-05 DIAGNOSIS — Z7689 Persons encountering health services in other specified circumstances: Secondary | ICD-10-CM

## 2013-02-05 DIAGNOSIS — I517 Cardiomegaly: Secondary | ICD-10-CM

## 2013-02-05 DIAGNOSIS — M549 Dorsalgia, unspecified: Secondary | ICD-10-CM

## 2013-02-05 NOTE — Progress Notes (Signed)
  Echocardiogram 2D Echocardiogram has been performed. Patient had 7 or 10 chest pain, right leg numbness and blood pressure of 164/100.  Was asked to go to the emergency room for evaluation but she refused and said she will "go see the doctor Monday".    Emmauel Hallums, [facility] 02/05/2013, 10:41 AM

## 2013-02-08 ENCOUNTER — Other Ambulatory Visit (HOSPITAL_COMMUNITY): Payer: Self-pay | Admitting: *Deleted

## 2013-02-08 DIAGNOSIS — N644 Mastodynia: Secondary | ICD-10-CM

## 2013-02-09 ENCOUNTER — Ambulatory Visit (HOSPITAL_COMMUNITY): Payer: Self-pay

## 2013-02-10 ENCOUNTER — Other Ambulatory Visit: Payer: Self-pay

## 2013-02-21 ENCOUNTER — Emergency Department (HOSPITAL_COMMUNITY)
Admission: EM | Admit: 2013-02-21 | Discharge: 2013-02-21 | Disposition: A | Discharge status: Home / Self Care | Payer: Self-pay | Attending: Emergency Medicine | Admitting: Emergency Medicine

## 2013-02-21 ENCOUNTER — Emergency Department (HOSPITAL_COMMUNITY): Payer: Self-pay

## 2013-02-21 ENCOUNTER — Encounter (HOSPITAL_COMMUNITY): Payer: Self-pay | Admitting: Emergency Medicine

## 2013-02-21 DIAGNOSIS — R209 Unspecified disturbances of skin sensation: Secondary | ICD-10-CM | POA: Insufficient documentation

## 2013-02-21 DIAGNOSIS — Z8673 Personal history of transient ischemic attack (TIA), and cerebral infarction without residual deficits: Secondary | ICD-10-CM | POA: Insufficient documentation

## 2013-02-21 DIAGNOSIS — R42 Dizziness and giddiness: Secondary | ICD-10-CM | POA: Insufficient documentation

## 2013-02-21 DIAGNOSIS — R079 Chest pain, unspecified: Secondary | ICD-10-CM

## 2013-02-21 DIAGNOSIS — Z79899 Other long term (current) drug therapy: Secondary | ICD-10-CM | POA: Insufficient documentation

## 2013-02-21 DIAGNOSIS — I10 Essential (primary) hypertension: Secondary | ICD-10-CM | POA: Insufficient documentation

## 2013-02-21 DIAGNOSIS — Z87891 Personal history of nicotine dependence: Secondary | ICD-10-CM | POA: Insufficient documentation

## 2013-02-21 DIAGNOSIS — R071 Chest pain on breathing: Secondary | ICD-10-CM | POA: Insufficient documentation

## 2013-02-21 LAB — COMPREHENSIVE METABOLIC PANEL
ALT: 33 U/L (ref 0–35)
AST: 29 U/L (ref 0–37)
Albumin: 3.5 g/dL (ref 3.5–5.2)
Alkaline Phosphatase: 106 U/L (ref 39–117)
BUN: 12 mg/dL (ref 6–23)
CO2: 25 mEq/L (ref 19–32)
Calcium: 9 mg/dL (ref 8.4–10.5)
Chloride: 102 mEq/L (ref 96–112)
Creatinine, Ser: 0.67 mg/dL (ref 0.50–1.10)
GFR calc Af Amer: >90 mL/min (ref >90)
GFR calc non Af Amer: >90 mL/min (ref >90)
Glucose, Bld: 109 mg/dL — ABNORMAL HIGH (ref 70–99)
POTASSIUM: 3.4 meq/L — AB (ref 3.7–5.3)
SODIUM: 141 meq/L (ref 137–147)
TOTAL PROTEIN: 7.1 g/dL (ref 6.0–8.3)
Total Bilirubin: 0.6 mg/dL (ref 0.3–1.2)

## 2013-02-21 LAB — CBC
HCT: 41.2 % (ref 36.0–46.0)
HEMOGLOBIN: 13.7 g/dL (ref 12.0–15.0)
MCH: 30.1 pg (ref 26.0–34.0)
MCHC: 33.3 g/dL (ref 30.0–36.0)
MCV: 90.5 fL (ref 78.0–100.0)
PLATELETS: 235 10*3/uL (ref 150–400)
RBC: 4.55 MIL/uL (ref 3.87–5.11)
RDW: 13.4 % (ref 11.5–15.5)
WBC: 4.9 10*3/uL (ref 4.0–10.5)

## 2013-02-21 LAB — POCT I-STAT TROPONIN I: Troponin i, poc: 0 ng/mL (ref 0.00–0.08)

## 2013-02-21 MED ORDER — TRAMADOL HCL 50 MG PO TABS: 50.0000 mg | ORAL_TABLET | Freq: Once | ORAL | Status: DC

## 2013-02-21 MED ORDER — ASPIRIN 81 MG PO CHEW
324.0000 mg | CHEWABLE_TABLET | Freq: Once | ORAL | Status: AC
  Administered 2013-02-21: 324 mg via ORAL
  Filled 2013-02-21: qty 4

## 2013-02-21 MED ORDER — TRAMADOL HCL 50 MG PO TABS
50.0000 mg | ORAL_TABLET | Freq: Once | ORAL | Status: AC
  Administered 2013-02-21: 50 mg via ORAL
  Filled 2013-02-21: qty 1

## 2013-02-21 NOTE — Discharge Instructions (Signed)
Chest Pain (Nonspecific) °It is often hard to give a specific diagnosis for the cause of chest pain. There is always a chance that your pain could be related to something serious, such as a heart attack or a blood clot in the lungs. You need to follow up with your caregiver for further evaluation. °CAUSES  °· Heartburn. °· Pneumonia or bronchitis. °· Anxiety or stress. °· Inflammation around your heart (pericarditis) or lung (pleuritis or pleurisy). °· A blood clot in the lung. °· A collapsed lung (pneumothorax). It can develop suddenly on its own (spontaneous pneumothorax) or from injury (trauma) to the chest. °· Shingles infection (herpes zoster virus). °The chest wall is composed of bones, muscles, and cartilage. Any of these can be the source of the pain. °· The bones can be bruised by injury. °· The muscles or cartilage can be strained by coughing or overwork. °· The cartilage can be affected by inflammation and become sore (costochondritis). °DIAGNOSIS  °Lab tests or other studies, such as X-rays, electrocardiography, stress testing, or cardiac imaging, may be needed to find the cause of your pain.  °TREATMENT  °· Treatment depends on what may be causing your chest pain. Treatment may include: °· Acid blockers for heartburn. °· Anti-inflammatory medicine. °· Pain medicine for inflammatory conditions. °· Antibiotics if an infection is present. °· You may be advised to change lifestyle habits. This includes stopping smoking and avoiding alcohol, caffeine, and chocolate. °· You may be advised to keep your head raised (elevated) when sleeping. This reduces the chance of acid going backward from your stomach into your esophagus. °· Most of the time, nonspecific chest pain will improve within 2 to 3 days with rest and mild pain medicine. °HOME CARE INSTRUCTIONS  °· If antibiotics were prescribed, take your antibiotics as directed. Finish them even if you start to feel better. °· For the next few days, avoid physical  activities that bring on chest pain. Continue physical activities as directed. °· Do not smoke. °· Avoid drinking alcohol. °· Only take over-the-counter or prescription medicine for pain, discomfort, or fever as directed by your caregiver. °· Follow your caregiver's suggestions for further testing if your chest pain does not go away. °· Keep any follow-up appointments you made. If you do not go to an appointment, you could develop lasting (chronic) problems with pain. If there is any problem keeping an appointment, you must call to reschedule. °SEEK MEDICAL CARE IF:  °· You think you are having problems from the medicine you are taking. Read your medicine instructions carefully. °· Your chest pain does not go away, even after treatment. °· You develop a rash with blisters on your chest. °SEEK IMMEDIATE MEDICAL CARE IF:  °· You have increased chest pain or pain that spreads to your arm, neck, jaw, back, or abdomen. °· You develop shortness of breath, an increasing cough, or you are coughing up blood. °· You have severe back or abdominal pain, feel nauseous, or vomit. °· You develop severe weakness, fainting, or chills. °· You have a fever. °THIS IS AN EMERGENCY. Do not wait to see if the pain will go away. Get medical help at once. Call your local emergency services (911 in [place] Do not drive yourself to the hospital. °MAKE SURE YOU:  °· Understand these instructions. °· Will watch your condition. °· Will get help right away if you are not doing well or get worse. °Document Released: 10/31/2004 Document Revised: 04/15/2011 Document Reviewed: 08/27/2007 °ExitCare® Patient Information ©2014 [employer]   [employer]. ° °

## 2013-02-21 NOTE — ED Notes (Signed)
Bed: NO67 Expected date: 02/21/13 Expected time: 8:17 AM Means of arrival: Ambulance Comments: Chest wall pain

## 2013-02-21 NOTE — ED Notes (Addendum)
Per EMS pt comes from a shelter c/o left side chest wall pain that she has had everyday since Oct 31,2014 when she was bit by a dog.  Pt also c/o right leg numbness that has been going on the same length of time. Per EMS painis painful upon palpitation, pt denies pain radiating, or n/v/d. Pt has had cough for past couple of days with clear production. EMS vital signs 126/84, 90HR, 16 RR, [id number]

## 2013-02-21 NOTE — ED Provider Notes (Signed)
CSN: [id number]     Arrival date & time 02/21/13  0827 History   First MD Initiated Contact with Patient 02/21/13 0830     Chief Complaint  Patient presents with  . chest wall pain      HPI Pt has had pain in her chest ever since Oct 31st. This seems to come and go and increased again today.  The pain is sharp.  It increases with movement and breathing.  It does not radiate. Being still helps.  She has had a slight cough.  No fever.    No vomiting or diarrhea.  No history of heart disease or PE.  She has also had numbness in her right leg.  This has been ongoing for the last year.  Pt states she had a stroke in the past.  This is a chronic issue and is not what brought her to the ED. Past Medical History  Diagnosis Date  . Hypertension     No meds prescribed; states intermittent  . Back pain   . Stroke     caused numbness in leg   Past Surgical History  Procedure Laterality Date  . Ganglion cyst excision    . Abdominal hysterectomy     No family history on file. History  Substance Use Topics  . Smoking status: Former Research scientist (life sciences)  . Smokeless tobacco: Never Used  . Alcohol Use: No   OB History   Grav Para Term Preterm Abortions TAB SAB Ect Mult Living                 Review of Systems  Neurological: Positive for dizziness.  All other systems reviewed and are negative.    Allergies  Codeine; Oxycodone; and Prednisone  Home Medications   Current Outpatient Rx  Name  Route  Sig  Dispense  Refill  . Aspirin-Salicylamide-Caffeine (BC HEADACHE PO)   Oral   Take 1 packet by mouth every 6 (six) hours as needed (for headache or pain).         Marland Kitchen gabapentin (NEURONTIN) 100 MG capsule   Oral   Take 1 capsule (100 mg total) by mouth 3 (three) times daily.   90 capsule   3   . RABIES VACCINE, PCEC IM   Intramuscular   Inject into the muscle.         . traMADol (ULTRAM) 50 MG tablet   Oral   Take 1 tablet (50 mg total) by mouth once.   21 tablet   0    BP 140/65   Pulse 85  Temp(Src) 98.1 F (36.7 C) (Oral)  Resp 14  SpO2 92% Physical Exam  Nursing note and vitals reviewed. Constitutional: She appears well-developed and well-nourished. No distress.  HENT:  Head: Normocephalic and atraumatic.  Right Ear: External ear normal.  Left Ear: External ear normal.  Eyes: Conjunctivae are normal. Right eye exhibits no discharge. Left eye exhibits no discharge. No scleral icterus.  Neck: Neck supple. No tracheal deviation present.  Cardiovascular: Normal rate, regular rhythm and intact distal pulses.   Pulmonary/Chest: Effort normal and breath sounds normal. No stridor. No respiratory distress. She has no wheezes. She has no rales. She exhibits tenderness (left side).  Abdominal: Soft. Bowel sounds are normal. She exhibits no distension. There is no tenderness. There is no rebound and no guarding.  Musculoskeletal: She exhibits no edema and no tenderness.  Neurological: She is alert. She has normal strength. A sensory deficit is present. No cranial nerve deficit (no  facial droop, extraocular movements intact, no slurred speech). She exhibits normal muscle tone. She displays no seizure activity. Coordination normal.  Decreased sensation left lle  Skin: Skin is warm and dry. No rash noted.  Psychiatric: She has a normal mood and affect.    ED Course  Procedures (including critical care time) Labs Review Labs Reviewed  COMPREHENSIVE METABOLIC PANEL - Abnormal; Notable for the following:    Potassium 3.4 (*)    Glucose, Bld 109 (*)    All other components within normal limits  CBC  POCT I-STAT TROPONIN I   Imaging Review Dg Chest 2 View  02/21/2013   CLINICAL DATA:  Chest pain and dyspnea  EXAM: CHEST  2 VIEW  COMPARISON:  PA and lateral chest x-ray of December 29, 2012.  FINDINGS: The lungs are adequately inflated. The interstitial markings are mildly prominent though stable. There is no alveolar infiltrate. There are coarse lung markings in the  retrocardiac region on the left which are slightly more conspicuous today than on the previous study. The cardiopericardial silhouette is normal in size. The pulmonary vascularity is not engorged. The mediastinum is normal in width. There is no pleural effusion.  IMPRESSION: Slightly increased lung markings in the retrocardiac region on the left may reflect subsegmental atelectasis or early interstitial pneumonia. There is no evidence of CHF.   Electronically Signed   By: David  Martinique   On: 02/21/2013 09:46    EKG Interpretation    Date/Time:  Sunday February 21 2013 08:32:07 EST Ventricular Rate:  83 PR Interval:  146 QRS Duration: 102 QT Interval:  394 QTC Calculation: 463 R Axis:   71 Text Interpretation:  Sinus rhythm No significant change since last tracing Confirmed by Narada Uzzle  MD-J, Sheri Gatchel ([id number]) on 02/21/2013 8:37:39 AM            MDM   1. Chest pain    Old records were reviewed.  The patient was last seen in the [place] in December. She was seen at that time for the same complaints. The patient had a negative ED workup previously including a CT scan of the chest.  The patient was scheduled for an outpatient echocardiogram and the plan was to followup with cardiology for her symptoms persisted.  Overall the patient is low risk considering the chronicity of her symptoms and negative workup in the emergency department. I do feel she would benefit from further outpatient evaluation.  Of note the chest x-ray suggests the possibility of an early interstitial pneumonia. Clinically I do not think that this correlates with her symptoms.  At this time there does not appear to be any evidence of an acute emergency medical condition and the patient appears stable for discharge with appropriate outpatient follow up.     Kathalene Frames, MD 02/21/13 [phone]

## 2013-02-23 ENCOUNTER — Emergency Department (HOSPITAL_COMMUNITY): Payer: Self-pay

## 2013-02-23 ENCOUNTER — Encounter (HOSPITAL_COMMUNITY): Payer: Self-pay | Admitting: Emergency Medicine

## 2013-02-23 ENCOUNTER — Emergency Department (HOSPITAL_COMMUNITY)
Admission: EM | Admit: 2013-02-23 | Discharge: 2013-02-23 | Disposition: A | Discharge status: Home / Self Care | Payer: Self-pay | Attending: Emergency Medicine | Admitting: Emergency Medicine

## 2013-02-23 DIAGNOSIS — I10 Essential (primary) hypertension: Secondary | ICD-10-CM | POA: Insufficient documentation

## 2013-02-23 DIAGNOSIS — I498 Other specified cardiac arrhythmias: Secondary | ICD-10-CM | POA: Insufficient documentation

## 2013-02-23 DIAGNOSIS — Z87891 Personal history of nicotine dependence: Secondary | ICD-10-CM | POA: Insufficient documentation

## 2013-02-23 DIAGNOSIS — Z8673 Personal history of transient ischemic attack (TIA), and cerebral infarction without residual deficits: Secondary | ICD-10-CM | POA: Insufficient documentation

## 2013-02-23 DIAGNOSIS — R0602 Shortness of breath: Secondary | ICD-10-CM | POA: Insufficient documentation

## 2013-02-23 DIAGNOSIS — R079 Chest pain, unspecified: Secondary | ICD-10-CM

## 2013-02-23 DIAGNOSIS — Z8739 Personal history of other diseases of the musculoskeletal system and connective tissue: Secondary | ICD-10-CM | POA: Insufficient documentation

## 2013-02-23 DIAGNOSIS — Z79899 Other long term (current) drug therapy: Secondary | ICD-10-CM | POA: Insufficient documentation

## 2013-02-23 LAB — CBC
HCT: 44 % (ref 36.0–46.0)
Hemoglobin: 14.9 g/dL (ref 12.0–15.0)
MCH: 30.8 pg (ref 26.0–34.0)
MCHC: 33.9 g/dL (ref 30.0–36.0)
MCV: 91.1 fL (ref 78.0–100.0)
PLATELETS: 237 10*3/uL (ref 150–400)
RBC: 4.83 MIL/uL (ref 3.87–5.11)
RDW: 13.5 % (ref 11.5–15.5)
WBC: 5.4 10*3/uL (ref 4.0–10.5)

## 2013-02-23 LAB — POCT I-STAT TROPONIN I: TROPONIN I, POC: 0.01 ng/mL (ref 0.00–0.08)

## 2013-02-23 LAB — BASIC METABOLIC PANEL
BUN: 12 mg/dL (ref 6–23)
CHLORIDE: 105 meq/L (ref 96–112)
CO2: 25 mEq/L (ref 19–32)
Calcium: 9.6 mg/dL (ref 8.4–10.5)
Creatinine, Ser: 0.62 mg/dL (ref 0.50–1.10)
GFR calc Af Amer: >90 mL/min (ref >90)
GFR calc non Af Amer: >90 mL/min (ref >90)
Glucose, Bld: 80 mg/dL (ref 70–99)
Potassium: 3.8 mEq/L (ref 3.7–5.3)
Sodium: 144 mEq/L (ref 137–147)

## 2013-02-23 MED ORDER — ASPIRIN 81 MG PO CHEW
324.0000 mg | CHEWABLE_TABLET | Freq: Once | ORAL | Status: AC
  Administered 2013-02-23: 324 mg via ORAL
  Filled 2013-02-23: qty 4

## 2013-02-23 NOTE — ED Notes (Signed)
Pt is here with fluttering in chest with left under breast area pain with some sob.

## 2013-02-23 NOTE — ED Notes (Signed)
Pt states the pain is still there and when she is completely still the pain disappears momentarily. Pt is anxious to leave stating she bored.

## 2013-02-23 NOTE — ED Provider Notes (Signed)
TIME SEEN: 11:49 AM  CHIEF COMPLAINT: Chest pain, shortness of breath, palpitations  HPI: Patient is a 58 year old female with a history of intermittent hypertension who does not take medications, remote history of tobacco for approximately 20 years, prior stroke who presents the emergency department with several months of constant, sharp chest pain underneath her left breast with a sister shortness of breath. She states today she came to the emergency department because she began to feel "fluttering in my heart". She states her chest pain is worse with movement of her arm to palpation of her chest. She states it is worse when she wears her bag on her left arm.  It is not pleuritic or exertional. She states she's been seen by her primary care physician at [facility] for this chest pain. She's had a negative stress test in the past. She reports her chest pain is constant but does wax and wane. She reports she feels the fluttering in her chest now. On the monitor, patient is in normal sinus rhythm with a heart rate in the 80s, no PVCs, no arrhythmia. Denies any nausea, vomiting, diaphoresis or dizziness. No prior history of PE or DVT, no exogenous hormone use, no recent prolonged immobilization, no lower extremity swelling or pain.  ROS: See HPI Constitutional: no fever  Eyes: no drainage  ENT: no runny nose   Cardiovascular:   chest pain  Resp:  SOB  GI: no vomiting GU: no dysuria Integumentary: no rash  Allergy: no hives  Musculoskeletal: no leg swelling  Neurological: no slurred speech ROS otherwise negative  PAST MEDICAL HISTORY/PAST SURGICAL HISTORY:  Past Medical History  Diagnosis Date  . Hypertension     No meds prescribed; states intermittent  . Back pain   . Stroke     caused numbness in leg    MEDICATIONS:  Prior to Admission medications   Medication Sig Start Date End Date Taking? Authorizing Provider  ARIPiprazole (ABILIFY PO) Take 1 tablet by mouth daily.    Yes Historical Provider, MD  Aspirin-Salicylamide-Caffeine (BC HEADACHE PO) Take 1 packet by mouth every 6 (six) hours as needed (for headache or pain).   Yes Historical Provider, MD  gabapentin (NEURONTIN) 100 MG capsule Take 1 capsule (100 mg total) by mouth 3 (three) times daily. 01/13/13  Yes Reyne Dumas, MD  traMADol (ULTRAM) 50 MG tablet Take 1 tablet (50 mg total) by mouth once. 02/21/13   Kathalene Frames, MD    ALLERGIES:  Allergies  Allergen Reactions  . Codeine     Headache  . Oxycodone Nausea Only and Palpitations  . Prednisone Other (See Comments)    Headache    SOCIAL HISTORY:  History  Substance Use Topics  . Smoking status: Former Research scientist (life sciences)  . Smokeless tobacco: Never Used  . Alcohol Use: No    FAMILY HISTORY: No family history on file.  EXAM: BP 133/80  Pulse 94  Temp(Src) 98.1 F (36.7 C) (Oral)  Resp 17  SpO2 92% CONSTITUTIONAL: Alert and oriented and responds appropriately to questions. Well-appearing; well-nourished HEAD: Normocephalic EYES: Conjunctivae clear, PERRL ENT: normal nose; no rhinorrhea; moist mucous membranes; pharynx without lesions noted NECK: Supple, no meningismus, no LAD  CARD: RRR; S1 and S2 appreciated; no murmurs, no clicks, no rubs, no gallops RESP: Normal chest excursion without splinting or tachypnea; breath sounds clear and equal bilaterally; no wheezes, no rhonchi, no rales, left chest wall is tender to palpation, able to reproduce patient's chest pain with movement of  her left arm ABD/GI: Normal bowel sounds; non-distended; soft, non-tender, no rebound, no guarding BACK:  The back appears normal and is non-tender to palpation, there is no CVA tenderness EXT: Normal ROM in all joints; non-tender to palpation; no edema; normal capillary refill; no cyanosis    SKIN: Normal color for age and race; warm NEURO: Moves all extremities equally PSYCH: The patient's mood and manner are appropriate. Grooming and personal hygiene are  appropriate.  MEDICAL DECISION MAKING: Patient here with very atypical left-sided chest pain has been present for several months. She reports it is never gone but does wax and wane. She is currently hemodynamically stable. Suspect her pain is musculoskeletal in nature. Will obtain one set of cardiac labs, chest x-ray.  EKG shows no ischemic changes but there is the possibility of flutter versus artifact in the inferior leads. Anterior lateral leads however are normal. Will repeat EKG.. She has no risk factors for pulmonary embolus.  Do not suspect ACS. She reports she is feeling palpitations currently but is in normal sinus rhythm with no arrhythmia on the monitor. We'll continue to closely monitor. If workup unremarkable, anticipate discharge home with outpatient followup. Patient comfortable with plan.  ED PROGRESS: Repeat EKG shows no arrhythmia. There is some T-wave inversions in 1 and aVL that were not present previously but suspect there may be limb reversal.   1:25 PM  Repeat EKG does show T-wave inversions in lateral leads. Patient symptoms seem to be likely due to chest wall pain but given her risk factors and new EKG changes, we'll discuss with cardiology. She is reports a negative stress test but this is not in our records. She has had CT of her chest for this workup for pain that she's had since October 2014. She has had a echocardiogram performed in 02/05/13 has not resulted. Her troponin here today is negative. Other labs are unremarkable. Chest x-ray clear.  2:41 PM  Pt does not want to stay for further evaluation or admission. She reports she needs to go home. Spoke with Dr. Percival Spanish with cardiology. He will follow patient up as an outpatient. Have discussed strict return precautions with the patient and explain her abnormal EKG results although I still suspect this is chest wall pain. Patient verbalizes understanding and is comfortable with plan.   EKG Interpretation     Date/Time:  Tuesday February 23 2013 09:23:09 EST Ventricular Rate:  90 PR Interval:  144 QRS Duration: 88 QT Interval:  372 QTC Calculation: 455 R Axis:   88 Text Interpretation:  Normal sinus rhythm Normal ECG Confirmed by Lala Been  DO, Chenay Nesmith ([id number]) on 02/23/2013 12:08:50 PM             EKG Interpretation    Date/Time:  Tuesday February 23 2013 12:26:08 EST Ventricular Rate:  82 PR Interval:  153 QRS Duration: 99 QT Interval:  398 QTC Calculation: 465 R Axis:   164 Text Interpretation:  Right and left arm electrode reversal, interpretation assumes no reversal Sinus rhythm Right axis deviation Abnormal T, consider ischemia, lateral leads Confirmed by Andreea Arca  DO, Harshan Kearley ([id number]) on 02/23/2013 12:31:56 PM              EKG Interpretation    Date/Time:  Tuesday February 23 2013 12:35:44 EST Ventricular Rate:  78 PR Interval:  153 QRS Duration: 96 QT Interval:  389 QTC Calculation: 443 R Axis:   -173 Text Interpretation:  Sinus rhythm Right axis deviation Abnormal T, consider ischemia, lateral leads Confirmed  by Leita Lindbloom  DO, Jolynn Bajorek [phone]) on 02/23/2013 12:41:31 PM             [place], DO 02/23/13 1442

## 2013-02-23 NOTE — ED Notes (Addendum)
Pt has multiple complaints. Pt came to ED for chest palpitations and pain below the left breast that have been occuring the the 31st of October. Pt denies cardiac hx, but patient states she is not on any medications for her blood pressure which she states is usually high. Pt is also complaining of foot, leg, and arm pain that she has had before the palpitations.  Pt is still complaining of palpitations and chest pain 8/10. Pt states she feels SOB with this pain. Pt has stopped wearing her bra to help with the pain and SOB. Pt has a flat affect and sits with arms crossed.

## 2013-02-23 NOTE — Discharge Planning (Signed)
[id number] Lorraine Ryan, Community Liaison  Patient states she has an appointment with the [facility] 03/01/13. My contact information given for any future questions or concerns.

## 2013-02-23 NOTE — Discharge Instructions (Signed)
Chest Pain (Nonspecific) °It is often hard to give a specific diagnosis for the cause of chest pain. There is always a chance that your pain could be related to something serious, such as a heart attack or a blood clot in the lungs. You need to follow up with your caregiver for further evaluation. °CAUSES  °· Heartburn. °· Pneumonia or bronchitis. °· Anxiety or stress. °· Inflammation around your heart (pericarditis) or lung (pleuritis or pleurisy). °· A blood clot in the lung. °· A collapsed lung (pneumothorax). It can develop suddenly on its own (spontaneous pneumothorax) or from injury (trauma) to the chest. °· Shingles infection (herpes zoster virus). °The chest wall is composed of bones, muscles, and cartilage. Any of these can be the source of the pain. °· The bones can be bruised by injury. °· The muscles or cartilage can be strained by coughing or overwork. °· The cartilage can be affected by inflammation and become sore (costochondritis). °DIAGNOSIS  °Lab tests or other studies, such as X-rays, electrocardiography, stress testing, or cardiac imaging, may be needed to find the cause of your pain.  °TREATMENT  °· Treatment depends on what may be causing your chest pain. Treatment may include: °· Acid blockers for heartburn. °· Anti-inflammatory medicine. °· Pain medicine for inflammatory conditions. °· Antibiotics if an infection is present. °· You may be advised to change lifestyle habits. This includes stopping smoking and avoiding alcohol, caffeine, and chocolate. °· You may be advised to keep your head raised (elevated) when sleeping. This reduces the chance of acid going backward from your stomach into your esophagus. °· Most of the time, nonspecific chest pain will improve within 2 to 3 days with rest and mild pain medicine. °HOME CARE INSTRUCTIONS  °· If antibiotics were prescribed, take your antibiotics as directed. Finish them even if you start to feel better. °· For the next few days, avoid physical  activities that bring on chest pain. Continue physical activities as directed. °· Do not smoke. °· Avoid drinking alcohol. °· Only take over-the-counter or prescription medicine for pain, discomfort, or fever as directed by your caregiver. °· Follow your caregiver's suggestions for further testing if your chest pain does not go away. °· Keep any follow-up appointments you made. If you do not go to an appointment, you could develop lasting (chronic) problems with pain. If there is any problem keeping an appointment, you must call to reschedule. °SEEK MEDICAL CARE IF:  °· You think you are having problems from the medicine you are taking. Read your medicine instructions carefully. °· Your chest pain does not go away, even after treatment. °· You develop a rash with blisters on your chest. °SEEK IMMEDIATE MEDICAL CARE IF:  °· You have increased chest pain or pain that spreads to your arm, neck, jaw, back, or abdomen. °· You develop shortness of breath, an increasing cough, or you are coughing up blood. °· You have severe back or abdominal pain, feel nauseous, or vomit. °· You develop severe weakness, fainting, or chills. °· You have a fever. °THIS IS AN EMERGENCY. Do not wait to see if the pain will go away. Get medical help at once. Call your local emergency services (911 in [place] Do not drive yourself to the hospital. °MAKE SURE YOU:  °· Understand these instructions. °· Will watch your condition. °· Will get help right away if you are not doing well or get worse. °Document Released: 10/31/2004 Document Revised: 04/15/2011 Document Reviewed: 08/27/2007 °ExitCare® Patient Information ©2014 [employer]   [employer]. ° °

## 2013-02-25 ENCOUNTER — Encounter: Payer: Self-pay | Admitting: Cardiology

## 2013-02-25 DIAGNOSIS — M549 Dorsalgia, unspecified: Secondary | ICD-10-CM | POA: Insufficient documentation

## 2013-02-25 DIAGNOSIS — I10 Essential (primary) hypertension: Secondary | ICD-10-CM | POA: Insufficient documentation

## 2013-02-25 DIAGNOSIS — Z8673 Personal history of transient ischemic attack (TIA), and cerebral infarction without residual deficits: Secondary | ICD-10-CM | POA: Insufficient documentation

## 2013-02-25 DIAGNOSIS — I639 Cerebral infarction, unspecified: Secondary | ICD-10-CM | POA: Insufficient documentation

## 2013-03-01 ENCOUNTER — Encounter: Payer: Self-pay | Admitting: Internal Medicine

## 2013-03-01 ENCOUNTER — Ambulatory Visit (INDEPENDENT_AMBULATORY_CARE_PROVIDER_SITE_OTHER): Payer: Self-pay | Admitting: Cardiology

## 2013-03-01 ENCOUNTER — Encounter: Payer: Self-pay | Admitting: Cardiology

## 2013-03-01 ENCOUNTER — Ambulatory Visit: Payer: Self-pay | Attending: Internal Medicine | Admitting: Internal Medicine

## 2013-03-01 ENCOUNTER — Encounter: Payer: Self-pay | Admitting: *Deleted

## 2013-03-01 VITALS — BP 147/91 | HR 77 | Ht 66.0 in | Wt 255.8 lb

## 2013-03-01 VITALS — BP 119/78 | HR 90 | Temp 97.4°F | Resp 16 | Ht 66.0 in | Wt 256.0 lb

## 2013-03-01 DIAGNOSIS — G8929 Other chronic pain: Secondary | ICD-10-CM | POA: Insufficient documentation

## 2013-03-01 DIAGNOSIS — R209 Unspecified disturbances of skin sensation: Secondary | ICD-10-CM | POA: Insufficient documentation

## 2013-03-01 DIAGNOSIS — M545 Low back pain, unspecified: Secondary | ICD-10-CM | POA: Insufficient documentation

## 2013-03-01 DIAGNOSIS — R200 Anesthesia of skin: Secondary | ICD-10-CM | POA: Insufficient documentation

## 2013-03-01 DIAGNOSIS — M79604 Pain in right leg: Secondary | ICD-10-CM

## 2013-03-01 DIAGNOSIS — R079 Chest pain, unspecified: Secondary | ICD-10-CM

## 2013-03-01 DIAGNOSIS — I10 Essential (primary) hypertension: Secondary | ICD-10-CM

## 2013-03-01 MED ORDER — ASPIRIN EC 81 MG PO TBEC: 81.0000 mg | DELAYED_RELEASE_TABLET | Freq: Every day | ORAL | Status: DC

## 2013-03-01 MED ORDER — TRAMADOL HCL 50 MG PO TABS: 50.0000 mg | ORAL_TABLET | Freq: Once | ORAL | Status: DC

## 2013-03-01 MED ORDER — GABAPENTIN 100 MG PO CAPS: 100.0000 mg | ORAL_CAPSULE | Freq: Three times a day (TID) | ORAL | Status: DC

## 2013-03-01 NOTE — Progress Notes (Signed)
Patient ID: Lorraine Ryan, female   DOB: 06-Dec-1955, 58 y.o.   MRN: [id number] Patient Demographics  Lorraine Ryan, is a 58 y.o. female  EPP:[id number]  [id number]  DOB - 1955-07-31  Chief Complaint  Patient presents with  . Follow-up        Subjective:   Lorraine Ryan is a 58 y.o. female here today for a follow up visit. She was seen recently in the ED where she had a thorough evaluation for chest pain. Echocardiogram in January of 2015 showed an ejection fraction of 50-55% and grade 1 diastolic dysfunction. Chest x-ray in January of 2015 showed no active cardiopulmonary disease. Troponin normal. Hemoglobin normal. Renal function/LFTs normal. CTA in November of 2014 showed no pulmonary embolus. There was coronary artery atherosclerosis. Patient has an appointment with cardiologist this morning. She also complained of low back pain going on for more than a year but now associated with numbness in her right leg. No history of fall, she did not describe symptoms of unsteady gait. No headache. Patient has No headache, No chest pain, No abdominal pain - No Nausea, No new weakness tingling or numbness, No Cough - SOB.  ALLERGIES: Allergies  Allergen Reactions  . Codeine     Headache  . Oxycodone Nausea Only and Palpitations  . Prednisone Other (See Comments)    Headache    PAST MEDICAL HISTORY: Past Medical History  Diagnosis Date  . Hypertension     No meds prescribed; states intermittent  . Back pain   . Stroke     caused numbness in leg    MEDICATIONS AT HOME: Prior to Admission medications   Medication Sig Start Date End Date Taking? Authorizing Provider  ARIPiprazole (ABILIFY PO) Take 1 tablet by mouth daily.   Yes Historical Provider, MD  Aspirin-Salicylamide-Caffeine (BC HEADACHE PO) Take 1 packet by mouth every 6 (six) hours as needed (for headache or pain).   Yes Historical Provider, MD  gabapentin (NEURONTIN) 100 MG capsule Take 1 capsule (100 mg total) by  mouth 3 (three) times daily. 03/01/13  Yes Angelica Chessman, MD  traMADol (ULTRAM) 50 MG tablet Take 1 tablet (50 mg total) by mouth once. 03/01/13  Yes Angelica Chessman, MD     Objective:   Filed Vitals:   03/01/13 0956  BP: 119/78  Pulse: 90  Temp: 97.4 F (36.3 C)  TempSrc: Oral  Resp: 16  Height: 5\' 6"  (1.676 m)  Weight: 256 lb (116.121 kg)  SpO2: 95%    Exam General appearance : Awake, alert, not in any distress. Speech Clear. Not toxic looking, obese HEENT: Atraumatic and Normocephalic, pupils equally reactive to light and accomodation Neck: supple, no JVD. No cervical lymphadenopathy.  Chest:Good air entry bilaterally, no added sounds  CVS: S1 S2 regular, no murmurs.  Abdomen: Bowel sounds present, Non tender and not distended with no gaurding, rigidity or rebound. Extremities: Straight leg raising test is negative bilaterally. B/L Lower Ext shows no edema, both legs are warm to touch Neurology: Awake alert, and oriented X 3, CN II-XII intact, Non focal Skin:No Rash Wounds:N/A   Data Review   CBC  Recent Labs Lab 02/23/13 1209  WBC 5.4  HGB 14.9  HCT 44.0  PLT 237  MCV 91.1  MCH 30.8  MCHC 33.9  RDW 13.5    Chemistries    Recent Labs Lab 02/23/13 1209  NA 144  K 3.8  CL 105  CO2 25  GLUCOSE 80  BUN 12  CREATININE 0.62  CALCIUM  9.6   ------------------------------------------------------------------------------------------------------------------ No results found for this basename: HGBA1C,  in the last 72 hours ------------------------------------------------------------------------------------------------------------------ No results found for this basename: CHOL, HDL, LDLCALC, TRIG, CHOLHDL, LDLDIRECT,  in the last 72 hours ------------------------------------------------------------------------------------------------------------------ No results found for this basename: TSH, T4TOTAL, FREET3, T3FREE, THYROIDAB,  in the last 72  hours ------------------------------------------------------------------------------------------------------------------ No results found for this basename: VITAMINB12, FOLATE, FERRITIN, TIBC, IRON, RETICCTPCT,  in the last 72 hours  Coagulation profile  No results found for this basename: INR, PROTIME,  in the last 168 hours    Assessment & Plan   1. Low back pain radiating to right leg  - traMADol (ULTRAM) 50 MG tablet; Take 1 tablet (50 mg total) by mouth once.  Dispense: 30 tablet; Refill: 0 - gabapentin (NEURONTIN) 100 MG capsule; Take 1 capsule (100 mg total) by mouth 3 (three) times daily.  Dispense: 90 capsule; Refill: 3 - MR Lumbar Spine Wo Contrast; Future  2. Numbness in right leg  - traMADol (ULTRAM) 50 MG tablet; Take 1 tablet (50 mg total) by mouth once.  Dispense: 30 tablet; Refill: 0 - gabapentin (NEURONTIN) 100 MG capsule; Take 1 capsule (100 mg total) by mouth 3 (three) times daily.  Dispense: 90 capsule; Refill: 3 - MR Lumbar Spine Wo Contrast; Future  Patient encouraged to comply with medications and to keep her appointment with cardiologist Patient was counseled extensively about nutrition and exercise  Follow up in 3 months or when necessary   The patient was given clear instructions to go to ER or return to medical center if symptoms don't improve, worsen or new problems develop. The patient verbalized understanding. The patient was told to call to get lab results if they haven't heard anything in the next week.    Angelica Chessman, MD, [place], [place], [place] [facility] [place], [place]   03/01/2013, 10:25 AM

## 2013-03-01 NOTE — Patient Instructions (Addendum)
Your physician recommends that you schedule a follow-up appointment in: AS NEEDED PENDING TEST RESULTS  Your physician has requested that you have a dobutamine echocardiogram. For further information please visit [email or web address]. Please follow instruction sheet as given.    START ASPIRIN 81 MG ONCE DAILY WITH FOOD

## 2013-03-01 NOTE — Assessment & Plan Note (Signed)
Symptoms atypical. Most likely musculoskeletal. CT showed no pulmonary embolus but there was note of atherosclerosis. Add aspirin 81 mg daily. Schedule dobutamine echocardiogram for risk stratification. If normal would suggest followup with primary care and lifestyle modification. She may also benefit from a statin but I will leave this to her primary care.

## 2013-03-01 NOTE — Assessment & Plan Note (Signed)
Blood pressure is mildly elevated today. However at her last office visit it was normal. Follow and add medications as needed.

## 2013-03-01 NOTE — Patient Instructions (Signed)
Back Pain, Adult Low back pain is very common. About 1 in 5 people have back pain.The cause of low back pain is rarely dangerous. The pain often gets better over time.About half of people with a sudden onset of back pain feel better in just 2 weeks. About 8 in 10 people feel better by 6 weeks.  CAUSES Some common causes of back pain include:  Strain of the muscles or ligaments supporting the spine.  Wear and tear (degeneration) of the spinal discs.  Arthritis.  Direct injury to the back. DIAGNOSIS Most of the time, the direct cause of low back pain is not known.However, back pain can be treated effectively even when the exact cause of the pain is unknown.Answering your caregiver's questions about your overall health and symptoms is one of the most accurate ways to make sure the cause of your pain is not dangerous. If your caregiver needs more information, he or she may order lab work or imaging tests (X-rays or MRIs).However, even if imaging tests show changes in your back, this usually does not require surgery. HOME CARE INSTRUCTIONS For many people, back pain returns.Since low back pain is rarely dangerous, it is often a condition that people can learn to manageon their own.   Remain active. It is stressful on the back to sit or stand in one place. Do not sit, drive, or stand in one place for more than 30 minutes at a time. Take short walks on level surfaces as soon as pain allows.Try to increase the length of time you walk each day.  Do not stay in bed.Resting more than 1 or 2 days can delay your recovery.  Do not avoid exercise or work.Your body is made to move.It is not dangerous to be active, even though your back may hurt.Your back will likely heal faster if you return to being active before your pain is gone.  Pay attention to your body when you bend and lift. Many people have less discomfortwhen lifting if they bend their knees, keep the load close to their bodies,and  avoid twisting. Often, the most comfortable positions are those that put less stress on your recovering back.  Find a comfortable position to sleep. Use a firm mattress and lie on your side with your knees slightly bent. If you lie on your back, put a pillow under your knees.  Only take over-the-counter or prescription medicines as directed by your caregiver. Over-the-counter medicines to reduce pain and inflammation are often the most helpful.Your caregiver may prescribe muscle relaxant drugs.These medicines help dull your pain so you can more quickly return to your normal activities and healthy exercise.  Put ice on the injured area.  Put ice in a plastic bag.  Place a towel between your skin and the bag.  Leave the ice on for 15-20 minutes, 03-04 times a day for the first 2 to 3 days. After that, ice and heat may be alternated to reduce pain and spasms.  Ask your caregiver about trying back exercises and gentle massage. This may be of some benefit.  Avoid feeling anxious or stressed.Stress increases muscle tension and can worsen back pain.It is important to recognize when you are anxious or stressed and learn ways to manage it.Exercise is a great option. SEEK MEDICAL CARE IF:  You have pain that is not relieved with rest or medicine.  You have pain that does not improve in 1 week.  You have new symptoms.  You are generally not feeling well. SEEK   IMMEDIATE MEDICAL CARE IF:   You have pain that radiates from your back into your legs.  You develop new bowel or bladder control problems.  You have unusual weakness or numbness in your arms or legs.  You develop nausea or vomiting.  You develop abdominal pain.  You feel faint. Document Released: 01/21/2005 Document Revised: 07/23/2011 Document Reviewed: 06/11/2010 [employer] Patient Information 2014 [employer].  

## 2013-03-01 NOTE — Progress Notes (Signed)
Pt is here following up on her chest pain which put her in the hospital. Pt states that she is unsteady on her feet and she is having numbness in her right thigh and pain in her lower back.

## 2013-03-01 NOTE — Progress Notes (Signed)
HPI: 58 year old female for evaluation of chest pain. CTA in November of 2014 showed no pulmonary embolus. There was coronary artery atherosclerosis. Echocardiogram in January of 2015 showed an ejection fraction of 50-55% and grade 1 diastolic dysfunction. Chest x-ray in January of 2015 showed no active cardiopulmonary disease. Recently seen in emergency room for chest pain and shortness of breath. Troponin normal. Hemoglobin normal. Renal function/LFTs normal. There was concern that she had new lateral T wave inversion by review of electrocardiograms shows limb lead reversal. Patient has had intermittent chest pain since October. It is in the left breast area without radiation. She describes associated dyspnea and occasional nausea. It lasts several minutes and resolves. It increases with moving her left upper extremity. It occurs both with exertion and at rest. Also with dyspnea on exertion.  Current Outpatient Prescriptions  Medication Sig Dispense Refill  . ARIPiprazole (ABILIFY PO) Take 1 tablet by mouth daily.      . Aspirin-Salicylamide-Caffeine (BC HEADACHE PO) Take 1 packet by mouth every 6 (six) hours as needed (for headache or pain).      Marland Kitchen gabapentin (NEURONTIN) 100 MG capsule Take 1 capsule (100 mg total) by mouth 3 (three) times daily.  90 capsule  3  . traMADol (ULTRAM) 50 MG tablet Take 1 tablet (50 mg total) by mouth once.  30 tablet  0  . aspirin EC 81 MG tablet Take 1 tablet (81 mg total) by mouth daily.  90 tablet  3   No current facility-administered medications for this visit.    Allergies  Allergen Reactions  . Codeine     Headache  . Oxycodone Nausea Only and Palpitations  . Prednisone Other (See Comments)    Headache    Past Medical History  Diagnosis Date  . Hypertension     No meds prescribed; states intermittent  . Back pain   . Stroke     caused numbness in leg    Past Surgical History  Procedure Laterality Date  . Ganglion cyst excision    .  Abdominal hysterectomy    . Tonsillectomy      History   Social History  . Marital Status: Divorced    Spouse Name: N/A    Number of Children: 25  . Years of Education: N/A   Occupational History  . Not on file.   Social History Main Topics  . Smoking status: Former Research scientist (life sciences)  . Smokeless tobacco: Never Used  . Alcohol Use: No  . Drug Use: No  . Sexual Activity: Not on file   Other Topics Concern  . Not on file   Social History Narrative  . No narrative on file    Family History  Problem Relation Age of Onset  . Heart disease      No family history    ROS: back pain but no fevers or chills, productive cough, hemoptysis, dysphasia, odynophagia, melena, hematochezia, dysuria, hematuria, rash, seizure activity, orthopnea, PND, pedal edema, claudication. Remaining systems are negative.  Physical Exam:   Blood pressure 147/91, pulse 77, height 5\' 6"  (1.676 m), weight 255 lb 12.8 oz (116.03 kg).  General:  Well developed/obese in NAD Skin warm/dry Patient not depressed No peripheral clubbing Back-normal HEENT-normal/normal eyelids Neck supple/normal carotid upstroke bilaterally; no bruits; no JVD; no thyromegaly chest - CTA/ normal expansion CV - RRR/normal S1 and S2; no murmurs, rubs or gallops;  PMI nondisplaced Abdomen -NT/ND, no HSM, no mass, + bowel sounds, no bruit 2+ femoral pulses, no  bruits Ext-no edema, chords, 2+ DP Neuro-grossly nonfocal  ECG sinus rhythm at a rate of 77. No ST changes.

## 2013-03-06 ENCOUNTER — Encounter (HOSPITAL_COMMUNITY): Payer: Self-pay | Admitting: Emergency Medicine

## 2013-03-06 ENCOUNTER — Emergency Department (HOSPITAL_COMMUNITY): Payer: Self-pay

## 2013-03-06 ENCOUNTER — Other Ambulatory Visit: Payer: Self-pay

## 2013-03-06 ENCOUNTER — Emergency Department (HOSPITAL_COMMUNITY)
Admission: EM | Admit: 2013-03-06 | Discharge: 2013-03-06 | Disposition: A | Discharge status: Home / Self Care | Payer: Self-pay | Attending: Emergency Medicine | Admitting: Emergency Medicine

## 2013-03-06 DIAGNOSIS — Z8673 Personal history of transient ischemic attack (TIA), and cerebral infarction without residual deficits: Secondary | ICD-10-CM | POA: Insufficient documentation

## 2013-03-06 DIAGNOSIS — I10 Essential (primary) hypertension: Secondary | ICD-10-CM | POA: Insufficient documentation

## 2013-03-06 DIAGNOSIS — M545 Low back pain, unspecified: Secondary | ICD-10-CM | POA: Insufficient documentation

## 2013-03-06 DIAGNOSIS — Z87891 Personal history of nicotine dependence: Secondary | ICD-10-CM | POA: Insufficient documentation

## 2013-03-06 DIAGNOSIS — Z79899 Other long term (current) drug therapy: Secondary | ICD-10-CM | POA: Insufficient documentation

## 2013-03-06 DIAGNOSIS — R079 Chest pain, unspecified: Secondary | ICD-10-CM | POA: Insufficient documentation

## 2013-03-06 DIAGNOSIS — R112 Nausea with vomiting, unspecified: Secondary | ICD-10-CM | POA: Insufficient documentation

## 2013-03-06 DIAGNOSIS — Z7982 Long term (current) use of aspirin: Secondary | ICD-10-CM | POA: Insufficient documentation

## 2013-03-06 LAB — URINALYSIS, ROUTINE W REFLEX MICROSCOPIC
Bilirubin Urine: NEGATIVE
Glucose, UA: NEGATIVE mg/dL
Ketones, ur: NEGATIVE mg/dL
LEUKOCYTES UA: NEGATIVE
Nitrite: NEGATIVE
Protein, ur: 30 mg/dL — AB
SPECIFIC GRAVITY, URINE: 1.027 (ref 1.005–1.030)
UROBILINOGEN UA: 1 mg/dL (ref 0.0–1.0)
pH: 7.5 (ref 5.0–8.0)

## 2013-03-06 LAB — COMPREHENSIVE METABOLIC PANEL
ALT: 33 U/L (ref 0–35)
AST: 34 U/L (ref 0–37)
Albumin: 4.2 g/dL (ref 3.5–5.2)
Alkaline Phosphatase: 104 U/L (ref 39–117)
BUN: 10 mg/dL (ref 6–23)
CALCIUM: 9.4 mg/dL (ref 8.4–10.5)
CO2: 24 mEq/L (ref 19–32)
CREATININE: 0.61 mg/dL (ref 0.50–1.10)
Chloride: 103 mEq/L (ref 96–112)
GFR calc Af Amer: >90 mL/min (ref >90)
GFR calc non Af Amer: >90 mL/min (ref >90)
Glucose, Bld: 96 mg/dL (ref 70–99)
Potassium: 3.5 mEq/L — ABNORMAL LOW (ref 3.7–5.3)
SODIUM: 144 meq/L (ref 137–147)
TOTAL PROTEIN: 8 g/dL (ref 6.0–8.3)
Total Bilirubin: 0.9 mg/dL (ref 0.3–1.2)

## 2013-03-06 LAB — TROPONIN I: Troponin I: <0.3 ng/mL (ref <0.30)

## 2013-03-06 LAB — URINE MICROSCOPIC-ADD ON

## 2013-03-06 LAB — CBC
HCT: 45.3 % (ref 36.0–46.0)
Hemoglobin: 15.2 g/dL — ABNORMAL HIGH (ref 12.0–15.0)
MCH: 30.6 pg (ref 26.0–34.0)
MCHC: 33.6 g/dL (ref 30.0–36.0)
MCV: 91.1 fL (ref 78.0–100.0)
PLATELETS: 178 10*3/uL (ref 150–400)
RBC: 4.97 MIL/uL (ref 3.87–5.11)
RDW: 13.4 % (ref 11.5–15.5)
WBC: 6.4 10*3/uL (ref 4.0–10.5)

## 2013-03-06 MED ORDER — ONDANSETRON HCL 8 MG PO TABS: 8.0000 mg | ORAL_TABLET | Freq: Three times a day (TID) | ORAL | Status: DC | PRN

## 2013-03-06 MED ORDER — POTASSIUM CHLORIDE CRYS ER 20 MEQ PO TBCR
40.0000 meq | EXTENDED_RELEASE_TABLET | Freq: Once | ORAL | Status: AC
  Administered 2013-03-06: 40 meq via ORAL
  Filled 2013-03-06: qty 2

## 2013-03-06 MED ORDER — TRAMADOL HCL 50 MG PO TABS
50.0000 mg | ORAL_TABLET | Freq: Once | ORAL | Status: AC
  Administered 2013-03-06: 50 mg via ORAL
  Filled 2013-03-06: qty 1

## 2013-03-06 MED ORDER — ONDANSETRON 4 MG PO TBDP
8.0000 mg | ORAL_TABLET | Freq: Once | ORAL | Status: AC
  Administered 2013-03-06: 8 mg via ORAL
  Filled 2013-03-06: qty 2

## 2013-03-06 MED ORDER — ACETAMINOPHEN 325 MG PO TABS
650.0000 mg | ORAL_TABLET | Freq: Once | ORAL | Status: AC
  Administered 2013-03-06: 650 mg via ORAL
  Filled 2013-03-06: qty 2

## 2013-03-06 NOTE — Discharge Instructions (Signed)
For chest pain, follow up with your cardiologist in coming week.  Also follow up with the dobutamine echo test as scheduled.  For back pain, avoid bending at waist, or heavy lifting > 20 pounds. Take your pain medication as need. You may take zofran as need for nausea if pain medication causes nausea.  Follow up your doctor this week, and with your mri as scheduled on February 9th.  You were given pain medication in the ER - no driving for the next 4 hours.  Return to ER if worse, recurrent or persistent chest pain, trouble breathing, intractable back pain, leg numbness/weakness, other concern.    Chest Pain (Nonspecific) It is often hard to give a specific diagnosis for the cause of chest pain. There is always a chance that your pain could be related to something serious, such as a heart attack or a blood clot in the lungs. You need to follow up with your caregiver for further evaluation. CAUSES   Heartburn.  Pneumonia or bronchitis.  Anxiety or stress.  Inflammation around your heart (pericarditis) or lung (pleuritis or pleurisy).  A blood clot in the lung.  A collapsed lung (pneumothorax). It can develop suddenly on its own (spontaneous pneumothorax) or from injury (trauma) to the chest.  Shingles infection (herpes zoster virus). The chest wall is composed of bones, muscles, and cartilage. Any of these can be the source of the pain.  The bones can be bruised by injury.  The muscles or cartilage can be strained by coughing or overwork.  The cartilage can be affected by inflammation and become sore (costochondritis). DIAGNOSIS  Lab tests or other studies, such as X-rays, electrocardiography, stress testing, or cardiac imaging, may be needed to find the cause of your pain.  TREATMENT   Treatment depends on what may be causing your chest pain. Treatment may include:  Acid blockers for heartburn.  Anti-inflammatory medicine.  Pain medicine for inflammatory  conditions.  Antibiotics if an infection is present.  You may be advised to change lifestyle habits. This includes stopping smoking and avoiding alcohol, caffeine, and chocolate.  You may be advised to keep your head raised (elevated) when sleeping. This reduces the chance of acid going backward from your stomach into your esophagus.  Most of the time, nonspecific chest pain will improve within 2 to 3 days with rest and mild pain medicine. HOME CARE INSTRUCTIONS   If antibiotics were prescribed, take your antibiotics as directed. Finish them even if you start to feel better.  For the next few days, avoid physical activities that bring on chest pain. Continue physical activities as directed.  Do not smoke.  Avoid drinking alcohol.  Only take over-the-counter or prescription medicine for pain, discomfort, or fever as directed by your caregiver.  Follow your caregiver's suggestions for further testing if your chest pain does not go away.  Keep any follow-up appointments you made. If you do not go to an appointment, you could develop lasting (chronic) problems with pain. If there is any problem keeping an appointment, you must call to reschedule. SEEK MEDICAL CARE IF:   You think you are having problems from the medicine you are taking. Read your medicine instructions carefully.  Your chest pain does not go away, even after treatment.  You develop a rash with blisters on your chest. SEEK IMMEDIATE MEDICAL CARE IF:   You have increased chest pain or pain that spreads to your arm, neck, jaw, back, or abdomen.  You develop shortness of breath,  an increasing cough, or you are coughing up blood.  You have severe back or abdominal pain, feel nauseous, or vomit.  You develop severe weakness, fainting, or chills.  You have a fever. THIS IS AN EMERGENCY. Do not wait to see if the pain will go away. Get medical help at once. Call your local emergency services (911 in [place] Do not drive  yourself to the hospital. MAKE SURE YOU:   Understand these instructions.  Will watch your condition.  Will get help right away if you are not doing well or get worse. Document Released: 10/31/2004 Document Revised: 04/15/2011 Document Reviewed: 08/27/2007 [facility] Patient Information 2014 [place].    Back Pain, Adult Low back pain is very common. About 1 in 5 people have back pain.The cause of low back pain is rarely dangerous. The pain often gets better over time.About half of people with a sudden onset of back pain feel better in just 2 weeks. About 8 in 10 people feel better by 6 weeks.  CAUSES Some common causes of back pain include:  Strain of the muscles or ligaments supporting the spine.  Wear and tear (degeneration) of the spinal discs.  Arthritis.  Direct injury to the back. DIAGNOSIS Most of the time, the direct cause of low back pain is not known.However, back pain can be treated effectively even when the exact cause of the pain is unknown.Answering your caregiver's questions about your overall health and symptoms is one of the most accurate ways to make sure the cause of your pain is not dangerous. If your caregiver needs more information, he or she may order lab work or imaging tests (X-rays or MRIs).However, even if imaging tests show changes in your back, this usually does not require surgery. HOME CARE INSTRUCTIONS For many people, back pain returns.Since low back pain is rarely dangerous, it is often a condition that people can learn to [facility] their own.   Remain active. It is stressful on the back to sit or stand in one place. Do not sit, drive, or stand in one place for more than 30 minutes at a time. Take short walks on level surfaces as soon as pain allows.Try to increase the length of time you walk each day.  Do not stay in bed.Resting more than 1 or 2 days can delay your recovery.  Do not avoid exercise or work.Your body is made to  move.It is not dangerous to be active, even though your back may hurt.Your back will likely heal faster if you return to being active before your pain is gone.  Pay attention to your body when you bend and lift. Many people have less discomfortwhen lifting if they bend their knees, keep the load close to their bodies,and avoid twisting. Often, the most comfortable positions are those that put less stress on your recovering back.  Find a comfortable position to sleep. Use a firm mattress and lie on your side with your knees slightly bent. If you lie on your back, put a pillow under your knees.  Only take over-the-counter or prescription medicines as directed by your caregiver. Over-the-counter medicines to reduce pain and inflammation are often the most helpful.Your caregiver may prescribe muscle relaxant drugs.These medicines help dull your pain so you can more quickly return to your normal activities and healthy exercise.  Put ice on the injured area.  Put ice in a plastic bag.  Place a towel between your skin and the bag.  Leave the ice on for 15-20  minutes, 03-04 times a day for the first 2 to 3 days. After that, ice and heat may be alternated to reduce pain and spasms.  Ask your caregiver about trying back exercises and gentle massage. This may be of some benefit.  Avoid feeling anxious or stressed.Stress increases muscle tension and can worsen back pain.It is important to recognize when you are anxious or stressed and learn ways to manage it.Exercise is a great option. SEEK MEDICAL CARE IF:  You have pain that is not relieved with rest or medicine.  You have pain that does not improve in 1 week.  You have new symptoms.  You are generally not feeling well. SEEK IMMEDIATE MEDICAL CARE IF:   You have pain that radiates from your back into your legs.  You develop new bowel or bladder control problems.  You have unusual weakness or numbness in your arms or legs.  You  develop nausea or vomiting.  You develop abdominal pain.  You feel faint. Document Released: 01/21/2005 Document Revised: 07/23/2011 Document Reviewed: 06/11/2010 [facility] Patient Information 2014 [place], [place].

## 2013-03-06 NOTE — ED Notes (Signed)
MD at bedside. 

## 2013-03-06 NOTE — ED Provider Notes (Addendum)
CSN: [id number]     Arrival date & time 03/06/13  3536 History   First MD Initiated Contact with Patient 03/06/13 [phone]     Chief Complaint  Patient presents with  . Chest Pain   (Consider location/radiation/quality/duration/timing/severity/associated sxs/prior Treatment) Patient is a 58 y.o. female presenting with chest pain. The history is provided by the patient.  Chest Pain Associated symptoms: nausea and vomiting   Associated symptoms: no abdominal pain, no back pain, no cough, no fever, no headache, no palpitations and no shortness of breath   pt c/o nv in past 1-2 days, few episodes, emesis clear to sl yellowish, not bloody or bilious. No abd pain or distension. Having normal bms. No diarrhea or constipation. No fever or chills. No gu c/o. Also states constant cp for past day, mid to left. Mild, dull, at rest. Not pleuritic. No specific exacerbating or alleviating factors. No change w activity or exertion, no change whether upright or supine. No associated nv, diaphoresis or sob. No unusual doe or fatigue.  Denies hx cad. No hx dvt or pe. ?hx gerd. Denies fam hx premature cad.  States has been having cp for the past several months, at rest, similar to this - states has been seen by ed and cardiologist, no definite dx.     Past Medical History  Diagnosis Date  . Hypertension     No meds prescribed; states intermittent  . Back pain   . Stroke     caused numbness in leg   Past Surgical History  Procedure Laterality Date  . Ganglion cyst excision    . Abdominal hysterectomy    . Tonsillectomy     Family History  Problem Relation Age of Onset  . Heart disease      No family history   History  Substance Use Topics  . Smoking status: Former Research scientist (life sciences)  . Smokeless tobacco: Never Used  . Alcohol Use: No   OB History   Grav Para Term Preterm Abortions TAB SAB Ect Mult Living                 Review of Systems  Constitutional: Negative for fever and chills.  HENT: Negative for  sore throat.   Eyes: Negative for redness.  Respiratory: Negative for cough and shortness of breath.   Cardiovascular: Positive for chest pain. Negative for palpitations and leg swelling.  Gastrointestinal: Positive for nausea and vomiting. Negative for abdominal pain, diarrhea and constipation.  Genitourinary: Negative for dysuria and flank pain.  Musculoskeletal: Negative for back pain and neck pain.  Skin: Negative for rash.  Neurological: Negative for headaches.  Hematological: Does not bruise/bleed easily.  Psychiatric/Behavioral: Negative for confusion.    Allergies  Codeine; Oxycodone; and Prednisone  Home Medications   Current Outpatient Rx  Name  Route  Sig  Dispense  Refill  . ARIPiprazole (ABILIFY PO)   Oral   Take 1 tablet by mouth daily.         Marland Kitchen aspirin EC 81 MG tablet   Oral   Take 1 tablet (81 mg total) by mouth daily.   90 tablet   3   . Aspirin-Salicylamide-Caffeine (BC HEADACHE PO)   Oral   Take 1 packet by mouth every 6 (six) hours as needed (for headache or pain).         Marland Kitchen gabapentin (NEURONTIN) 100 MG capsule   Oral   Take 1 capsule (100 mg total) by mouth 3 (three) times daily.   90 capsule  3   . traMADol (ULTRAM) 50 MG tablet   Oral   Take 1 tablet (50 mg total) by mouth once.   30 tablet   0    BP 114/94  Pulse 104  Temp(Src) 97.7 F (36.5 C) (Oral)  Resp 20  Ht 5\' 6"  (1.676 m)  Wt 258 lb (117.028 kg)  BMI 41.66 kg/m2 Physical Exam  Nursing note and vitals reviewed. Constitutional: She is oriented to person, place, and time. She appears well-developed and well-nourished. No distress.  HENT:  Mouth/Throat: Oropharynx is clear and moist.  Eyes: Conjunctivae are normal. No scleral icterus.  Neck: Neck supple. No tracheal deviation present.  Cardiovascular: Normal rate, regular rhythm, normal heart sounds and intact distal pulses.  Exam reveals no gallop and no friction rub.   No murmur heard. Pulmonary/Chest: Effort normal  and breath sounds normal. No respiratory distress. She exhibits tenderness.  Abdominal: Soft. Normal appearance and bowel sounds are normal. She exhibits no distension and no mass. There is no tenderness. There is no rebound and no guarding.  No hernia.   Genitourinary:  No cva tenderness  Musculoskeletal: She exhibits no edema and no tenderness.  tls spine non tender. Straight leg raise neg.   Neurological: She is alert and oriented to person, place, and time. She displays normal reflexes.  Motor 5/5 bil legs.   Skin: Skin is warm and dry. No rash noted.  Psychiatric: She has a normal mood and affect.    ED Course  Procedures (including critical care time)   Results for orders placed during the hospital encounter of 03/06/13  CBC      Result Value Range   WBC 6.4  4.0 - 10.5 K/uL   RBC 4.97  3.87 - 5.11 MIL/uL   Hemoglobin 15.2 (*) 12.0 - 15.0 g/dL   HCT 45.3  36.0 - 46.0 %   MCV 91.1  78.0 - 100.0 fL   MCH 30.6  26.0 - 34.0 pg   MCHC 33.6  30.0 - 36.0 g/dL   RDW 13.4  11.5 - 15.5 %   Platelets 178  150 - 400 K/uL  COMPREHENSIVE METABOLIC PANEL      Result Value Range   Sodium 144  137 - 147 mEq/L   Potassium 3.5 (*) 3.7 - 5.3 mEq/L   Chloride 103  96 - 112 mEq/L   CO2 24  19 - 32 mEq/L   Glucose, Bld 96  70 - 99 mg/dL   BUN 10  6 - 23 mg/dL   Creatinine, Ser 0.61  0.50 - 1.10 mg/dL   Calcium 9.4  8.4 - 10.5 mg/dL   Total Protein 8.0  6.0 - 8.3 g/dL   Albumin 4.2  3.5 - 5.2 g/dL   AST 34  0 - 37 U/L   ALT 33  0 - 35 U/L   Alkaline Phosphatase 104  39 - 117 U/L   Total Bilirubin 0.9  0.3 - 1.2 mg/dL   GFR calc non Af Amer >90  >90 mL/min   GFR calc Af Amer >90  >90 mL/min  TROPONIN I      Result Value Range   Troponin I <0.30  <0.30 ng/mL  URINALYSIS, ROUTINE W REFLEX MICROSCOPIC      Result Value Range   Color, Urine YELLOW  YELLOW   APPearance CLEAR  CLEAR   Specific Gravity, Urine 1.027  1.005 - 1.030   pH 7.5  5.0 - 8.0   Glucose, UA NEGATIVE  NEGATIVE  mg/dL   Hgb urine dipstick SMALL (*) NEGATIVE   Bilirubin Urine NEGATIVE  NEGATIVE   Ketones, ur NEGATIVE  NEGATIVE mg/dL   Protein, ur 30 (*) NEGATIVE mg/dL   Urobilinogen, UA 1.0  0.0 - 1.0 mg/dL   Nitrite NEGATIVE  NEGATIVE   Leukocytes, UA NEGATIVE  NEGATIVE  TROPONIN I      Result Value Range   Troponin I <0.30  <0.30 ng/mL  URINE MICROSCOPIC-ADD ON      Result Value Range   Squamous Epithelial / LPF RARE  RARE   WBC, UA 0-2  <3 WBC/hpf   RBC / HPF 3-6  <3 RBC/hpf   Bacteria, UA RARE  RARE   Dg Chest 2 View  03/06/2013   CLINICAL DATA:  Hypertension, left side chest pain for 1 week, shortness of breath, vomiting  EXAM: CHEST  2 VIEW  COMPARISON:  02/23/2013  FINDINGS: Normal heart size, mediastinal contours, and pulmonary vascularity.  Lungs clear.  No pleural effusion or pneumothorax.  Bones unremarkable.  IMPRESSION: No acute abnormalities.   Electronically Signed   By: Lavonia Dana M.D.   On: 03/06/2013 09:24         Date: 03/06/2013  Rate: 104  Rhythm: sinus tachycardia  QRS Axis: normal  Intervals: normal  ST/T Wave abnormalities: normal  Conduction Disutrbances:none  Narrative Interpretation:   Old EKG Reviewed: unchanged   MDM  Iv ns. o2 [place]. Labs. Continuous pulse ox and monitor.   Ecg. Cxr.  Reviewed nursing notes and prior charts for additional history.   Pt with recent eval at cardiology for same - felt pain/symptoms likely musculoskeletal - pt was set up for dobutamine echo in the next couple weeks.  zofran po. Ultram po.  On recheck, pt notes issues w low back pain x 1 yr, radiates down right leg on occasion.  No abrupt change in pain today. No new numbness/weakness.  No urinary incontinence or retention.  ?dysuria.  Will add ua to labs.  On review records, appears pt has outpt mr LS spine set up for next 1-2 weeks.   Pt reports pain meds cause nausea, but have improved pain. Also notes nausea improved w zofran, pt tolerating po fluids.   After  symptoms for past 24+ hrs, trop x 2 neg.  Recheck, tolerating po fluids. abd soft nt. No cp.  Appears stable for d/c.        Mirna Mires, MD 03/06/13 [phone]

## 2013-03-06 NOTE — ED Notes (Addendum)
Pt c/o left sided chest pain x 1 week. Pt reports, "I have been sick all week." Pain accompanied by shortness of breath and vomiting. Pt talking in complete sentences without difficulty. Pt reports, "I think its the tramadol." Pt started taking tramadol for back pain 3 days ago.

## 2013-03-08 NOTE — Progress Notes (Signed)
Spoke with FT PA Delos Haring.Order given for Zofran 4mg  ODT- 1 Tablet PO- 6 hourly / PRN/ Nausea- Disp # 20 Tablets. Telephone-verbal order read back and affirmed with Pharmacist  At [phone].CM called patient [phone] to inform her that her Zofran prescription had been called into.Patient reports she is thankful for CM Help and will collect her Medication.

## 2013-03-08 NOTE — Progress Notes (Signed)
Incoming call from patient.Demographics verified in EPIC.Patient reports she lost her Zofran prescription .She uses the [facility] on [address]. Correction request sheet completed and left in FT folder for PA review.No further CM needs.

## 2013-03-15 ENCOUNTER — Ambulatory Visit (HOSPITAL_COMMUNITY)
Admission: RE | Admit: 2013-03-15 | Discharge: 2013-03-15 | Disposition: A | Discharge status: Home / Self Care | Payer: Self-pay | Source: Ambulatory Visit | Attending: Internal Medicine | Admitting: Internal Medicine

## 2013-03-15 DIAGNOSIS — R200 Anesthesia of skin: Secondary | ICD-10-CM

## 2013-03-15 DIAGNOSIS — M545 Low back pain, unspecified: Secondary | ICD-10-CM

## 2013-03-15 DIAGNOSIS — M5137 Other intervertebral disc degeneration, lumbosacral region: Secondary | ICD-10-CM | POA: Insufficient documentation

## 2013-03-15 DIAGNOSIS — M51379 Other intervertebral disc degeneration, lumbosacral region without mention of lumbar back pain or lower extremity pain: Secondary | ICD-10-CM | POA: Insufficient documentation

## 2013-03-15 DIAGNOSIS — M5126 Other intervertebral disc displacement, lumbar region: Secondary | ICD-10-CM | POA: Insufficient documentation

## 2013-03-15 DIAGNOSIS — M47817 Spondylosis without myelopathy or radiculopathy, lumbosacral region: Secondary | ICD-10-CM | POA: Insufficient documentation

## 2013-03-16 ENCOUNTER — Telehealth: Payer: Self-pay | Admitting: *Deleted

## 2013-03-16 NOTE — Telephone Encounter (Signed)
Message copied by [name] on Tue Mar 16, 2013  2:10 PM ------      Message from: Lorraine Ryan E      Created: Mon Mar 15, 2013  2:33 PM       Please inform patient that her lumbar MRI shows: Mostly arthritis of the lumbar spine and a mild impingement on the nerves. We need to refer her to a neurosurgeon for further evaluation and treatment ------

## 2013-03-16 NOTE — Telephone Encounter (Signed)
Unable to leave message

## 2013-04-02 ENCOUNTER — Other Ambulatory Visit (HOSPITAL_COMMUNITY): Payer: Self-pay

## 2013-04-02 ENCOUNTER — Ambulatory Visit (HOSPITAL_COMMUNITY): Payer: Self-pay | Attending: Cardiology | Admitting: Radiology

## 2013-04-02 DIAGNOSIS — E669 Obesity, unspecified: Secondary | ICD-10-CM | POA: Insufficient documentation

## 2013-04-02 DIAGNOSIS — R0989 Other specified symptoms and signs involving the circulatory and respiratory systems: Secondary | ICD-10-CM | POA: Insufficient documentation

## 2013-04-02 DIAGNOSIS — R072 Precordial pain: Secondary | ICD-10-CM | POA: Insufficient documentation

## 2013-04-02 DIAGNOSIS — R0609 Other forms of dyspnea: Secondary | ICD-10-CM | POA: Insufficient documentation

## 2013-04-02 DIAGNOSIS — R079 Chest pain, unspecified: Secondary | ICD-10-CM

## 2013-04-02 DIAGNOSIS — I10 Essential (primary) hypertension: Secondary | ICD-10-CM

## 2013-04-02 NOTE — Progress Notes (Signed)
Dobutamine stress echo performed. 

## 2013-05-27 ENCOUNTER — Ambulatory Visit: Payer: Self-pay | Attending: Internal Medicine | Admitting: Internal Medicine

## 2013-05-27 VITALS — BP 145/91 | HR 84 | Temp 98.2°F | Resp 16

## 2013-05-27 DIAGNOSIS — R209 Unspecified disturbances of skin sensation: Secondary | ICD-10-CM | POA: Insufficient documentation

## 2013-05-27 DIAGNOSIS — I69998 Other sequelae following unspecified cerebrovascular disease: Secondary | ICD-10-CM | POA: Insufficient documentation

## 2013-05-27 DIAGNOSIS — Z87891 Personal history of nicotine dependence: Secondary | ICD-10-CM | POA: Insufficient documentation

## 2013-05-27 DIAGNOSIS — Z79899 Other long term (current) drug therapy: Secondary | ICD-10-CM | POA: Insufficient documentation

## 2013-05-27 DIAGNOSIS — G8929 Other chronic pain: Secondary | ICD-10-CM | POA: Insufficient documentation

## 2013-05-27 DIAGNOSIS — Z7982 Long term (current) use of aspirin: Secondary | ICD-10-CM | POA: Insufficient documentation

## 2013-05-27 DIAGNOSIS — I10 Essential (primary) hypertension: Secondary | ICD-10-CM | POA: Insufficient documentation

## 2013-05-27 DIAGNOSIS — Z888 Allergy status to other drugs, medicaments and biological substances status: Secondary | ICD-10-CM | POA: Insufficient documentation

## 2013-05-27 DIAGNOSIS — IMO0002 Reserved for concepts with insufficient information to code with codable children: Secondary | ICD-10-CM | POA: Insufficient documentation

## 2013-05-27 DIAGNOSIS — M47817 Spondylosis without myelopathy or radiculopathy, lumbosacral region: Secondary | ICD-10-CM | POA: Insufficient documentation

## 2013-05-27 LAB — CBC WITH DIFFERENTIAL/PLATELET
BASOS PCT: 1 % (ref 0–1)
Basophils Absolute: 0.1 10*3/uL (ref 0.0–0.1)
Eosinophils Absolute: 0.1 10*3/uL (ref 0.0–0.7)
Eosinophils Relative: 2 % (ref 0–5)
HEMATOCRIT: 40.3 % (ref 36.0–46.0)
Hemoglobin: 13.9 g/dL (ref 12.0–15.0)
Lymphocytes Relative: 36 % (ref 12–46)
Lymphs Abs: 1.9 10*3/uL (ref 0.7–4.0)
MCH: 30.3 pg (ref 26.0–34.0)
MCHC: 34.5 g/dL (ref 30.0–36.0)
MCV: 87.8 fL (ref 78.0–100.0)
MONO ABS: 0.4 10*3/uL (ref 0.1–1.0)
Monocytes Relative: 8 % (ref 3–12)
Neutro Abs: 2.8 10*3/uL (ref 1.7–7.7)
Neutrophils Relative %: 53 % (ref 43–77)
Platelets: 245 10*3/uL (ref 150–400)
RBC: 4.59 MIL/uL (ref 3.87–5.11)
RDW: 13.9 % (ref 11.5–15.5)
WBC: 5.3 10*3/uL (ref 4.0–10.5)

## 2013-05-27 LAB — COMPREHENSIVE METABOLIC PANEL
ALT: 20 U/L (ref 0–35)
AST: 19 U/L (ref 0–37)
Albumin: 4 g/dL (ref 3.5–5.2)
Alkaline Phosphatase: 93 U/L (ref 39–117)
BILIRUBIN TOTAL: 0.6 mg/dL (ref 0.2–1.2)
BUN: 11 mg/dL (ref 6–23)
CALCIUM: 9.4 mg/dL (ref 8.4–10.5)
CHLORIDE: 107 meq/L (ref 96–112)
CO2: 30 mEq/L (ref 19–32)
CREATININE: 0.62 mg/dL (ref 0.50–1.10)
Glucose, Bld: 82 mg/dL (ref 70–99)
Potassium: 4.1 mEq/L (ref 3.5–5.3)
Sodium: 142 mEq/L (ref 135–145)
Total Protein: 6.7 g/dL (ref 6.0–8.3)

## 2013-05-27 LAB — URIC ACID: URIC ACID, SERUM: 5.5 mg/dL (ref 2.4–7.0)

## 2013-05-27 LAB — LIPID PANEL
CHOL/HDL RATIO: 3.6 ratio
CHOLESTEROL: 218 mg/dL — AB (ref 0–200)
HDL: 61 mg/dL (ref >39)
LDL Cholesterol: 134 mg/dL — ABNORMAL HIGH (ref 0–99)
Triglycerides: 113 mg/dL (ref <150)
VLDL: 23 mg/dL (ref 0–40)

## 2013-05-27 MED ORDER — COLCHICINE 0.6 MG PO TABS: 0.6000 mg | ORAL_TABLET | Freq: Two times a day (BID) | ORAL | Status: DC | PRN

## 2013-05-27 NOTE — Progress Notes (Signed)
Patient here for follow up Complains of back pain right leg pain And right foot swollen

## 2013-05-27 NOTE — Patient Instructions (Signed)

## 2013-05-28 LAB — TSH: TSH: 1.238 u[IU]/mL (ref 0.350–4.500)

## 2013-05-31 ENCOUNTER — Ambulatory Visit: Payer: Self-pay

## 2013-05-31 ENCOUNTER — Other Ambulatory Visit: Payer: Self-pay

## 2013-06-02 ENCOUNTER — Ambulatory Visit (HOSPITAL_COMMUNITY)
Admission: RE | Admit: 2013-06-02 | Discharge: 2013-06-02 | Disposition: A | Discharge status: Home / Self Care | Payer: Self-pay | Source: Ambulatory Visit | Attending: Internal Medicine | Admitting: Internal Medicine

## 2013-06-02 ENCOUNTER — Ambulatory Visit: Payer: Self-pay | Attending: Internal Medicine

## 2013-06-02 ENCOUNTER — Telehealth: Payer: Self-pay | Admitting: *Deleted

## 2013-06-02 DIAGNOSIS — M79609 Pain in unspecified limb: Secondary | ICD-10-CM | POA: Insufficient documentation

## 2013-06-02 DIAGNOSIS — I10 Essential (primary) hypertension: Secondary | ICD-10-CM

## 2013-06-02 DIAGNOSIS — Z0000 Encounter for general adult medical examination without abnormal findings: Secondary | ICD-10-CM

## 2013-06-02 NOTE — Telephone Encounter (Signed)
Received a call from patient stating she forgot her paperwork for her foot x-ray. Patient wanted to know if we could fax over the paper work to radiology. Informed patient that the radiology department can access her record through the computer system, so there is no need for [place] to fax over any paper work. Patient verbalized understanding. Alverda Skeans, RN

## 2013-06-04 ENCOUNTER — Ambulatory Visit: Payer: Self-pay | Attending: Internal Medicine

## 2013-06-04 NOTE — Progress Notes (Unsigned)
Pt returns for PPD reading on left forearm No redness or swelling noted. No induration Pt given negative report

## 2013-06-08 ENCOUNTER — Telehealth: Payer: Self-pay | Admitting: Internal Medicine

## 2013-06-08 NOTE — Telephone Encounter (Signed)
Pt. has not received x-ray results from 06/02/13, please call pt. with results

## 2013-06-13 NOTE — Progress Notes (Signed)
Patient ID: Lorraine Ryan, female   DOB: 1955/08/07, 58 y.o.   MRN: [id number]   CC: low back pain   HPI: Pt is 58 yo female with chronic low back pain, presenting for follow up. Pain is still present, intermittent and throbbing, 5/10 in severity, radiating to bilateral LE, no specific alleviating or aggravating factors. No traumas to the area.   Allergies  Allergen Reactions  . Codeine     Headache  . Tramadol Nausea Only  . Oxycodone Nausea Only and Palpitations  . Prednisone Other (See Comments)    Headache   Past Medical History  Diagnosis Date  . Hypertension     No meds prescribed; states intermittent  . Back pain   . Stroke     caused numbness in leg   Current Outpatient Prescriptions on File Prior to Visit  Medication Sig Dispense Refill  . ARIPiprazole (ABILIFY PO) Take 1 tablet by mouth daily.      Marland Kitchen aspirin EC 81 MG tablet Take 1 tablet (81 mg total) by mouth daily.  90 tablet  3  . Aspirin-Salicylamide-Caffeine (BC HEADACHE PO) Take 1 packet by mouth every 6 (six) hours as needed (for headache or pain).      Marland Kitchen gabapentin (NEURONTIN) 100 MG capsule Take 1 capsule (100 mg total) by mouth 3 (three) times daily.  90 capsule  3  . ondansetron (ZOFRAN) 8 MG tablet Take 1 tablet (8 mg total) by mouth every 8 (eight) hours as needed for nausea or vomiting.  12 tablet  0   No current facility-administered medications on file prior to visit.   Family History  Problem Relation Age of Onset  . Heart disease      No family history   History   Social History  . Marital Status: Divorced    Spouse Name: N/A    Number of Children: 81  . Years of Education: N/A   Occupational History  . Not on file.   Social History Main Topics  . Smoking status: Former Research scientist (life sciences)  . Smokeless tobacco: Never Used  . Alcohol Use: No  . Drug Use: No  . Sexual Activity: Not on file   Other Topics Concern  . Not on file   Social History Narrative  . No narrative on file    Review of  Systems  Constitutional: Negative for fever, chills, diaphoresis, activity change, appetite change and fatigue.  HENT: Negative for ear pain, nosebleeds, congestion, facial swelling, rhinorrhea, neck pain, neck stiffness and ear discharge.   Eyes: Negative for pain, discharge, redness, itching and visual disturbance.  Respiratory: Negative for cough, choking, chest tightness, shortness of breath, wheezing and stridor.   Cardiovascular: Negative for chest pain, palpitations and leg swelling.  Gastrointestinal: Negative for abdominal distention.  Genitourinary: Negative for dysuria, urgency, frequency, hematuria, flank pain, decreased urine volume, difficulty urinating and dyspareunia.  Musculoskeletal: Negative for arthralgias and gait problem.  Neurological: Negative for dizziness, tremors, seizures, syncope, facial asymmetry, speech difficulty, weakness, light-headedness, numbness and headaches.  Hematological: Negative for adenopathy. Does not bruise/bleed easily.  Psychiatric/Behavioral: Negative for hallucinations, behavioral problems, confusion, dysphoric mood, decreased concentration and agitation.    Objective:   Filed Vitals:   05/27/13 1414  BP: 145/91  Pulse: 84  Temp: 98.2 F (36.8 C)  Resp: 16    Physical Exam  Constitutional: Appears well-developed and well-nourished. No distress.  HENT: Normocephalic. External right and left ear normal. Oropharynx is clear and moist.  Eyes: Conjunctivae and EOM are  normal. PERRLA, no scleral icterus.  Neck: Normal ROM. Neck supple. No JVD. No tracheal deviation. No thyromegaly.  CVS: RRR, S1/S2 +, no murmurs, no gallops, no carotid bruit.  Pulmonary: Effort and breath sounds normal, no stridor, rhonchi, wheezes, rales.  Abdominal: Soft. BS +,  no distension, tenderness, rebound or guarding.  Musculoskeletal: Normal range of motion. Lumbar paraspinal area tenderness    Lab Results  Component Value Date   WBC 5.3 05/27/2013   HGB  13.9 05/27/2013   HCT 40.3 05/27/2013   MCV 87.8 05/27/2013   PLT 245 05/27/2013   Lab Results  Component Value Date   CREATININE 0.62 05/27/2013   BUN 11 05/27/2013   NA 142 05/27/2013   K 4.1 05/27/2013   CL 107 05/27/2013   CO2 30 05/27/2013    No results found for this basename: HGBA1C   Lipid Panel     Component Value Date/Time   CHOL 218* 05/27/2013 1440   TRIG 113 05/27/2013 1440   HDL 61 05/27/2013 1440   CHOLHDL 3.6 05/27/2013 1440   VLDL 23 05/27/2013 1440   LDLCALC 134* 05/27/2013 1440       Assessment and plan:   Patient Active Problem List   Diagnosis Date Noted  . Low back pain radiating to right leg - appears to be sciatic pain, recent MRI in Feb 2015 with  1. Lumbar spondylosis, degenerative disc disease, and prominent  epidural adipose tissues contribute to prominent impingement at L4-5  and mild impingement at L2-3, L3-4, and L5-S1 as detailed above.  2. Bilateral renal fluid signal intensity lesions favor cysts.   03/01/2013  . Hypertension - continue to check BP regularly and call us back if the numbers are > 140/90

## 2013-06-14 ENCOUNTER — Ambulatory Visit (HOSPITAL_COMMUNITY)
Admission: RE | Admit: 2013-06-14 | Discharge: 2013-06-14 | Disposition: A | Discharge status: Home / Self Care | Payer: Self-pay | Source: Ambulatory Visit | Attending: Internal Medicine | Admitting: Internal Medicine

## 2013-06-14 DIAGNOSIS — Z87891 Personal history of nicotine dependence: Secondary | ICD-10-CM | POA: Insufficient documentation

## 2013-06-14 DIAGNOSIS — I10 Essential (primary) hypertension: Secondary | ICD-10-CM

## 2013-06-14 DIAGNOSIS — R079 Chest pain, unspecified: Secondary | ICD-10-CM | POA: Insufficient documentation

## 2013-06-14 DIAGNOSIS — R0602 Shortness of breath: Secondary | ICD-10-CM | POA: Insufficient documentation

## 2013-06-15 ENCOUNTER — Ambulatory Visit: Payer: Self-pay

## 2013-06-16 NOTE — Telephone Encounter (Signed)
Please result x ray.

## 2013-07-02 ENCOUNTER — Ambulatory Visit: Payer: Self-pay | Admitting: Internal Medicine

## 2014-01-06 ENCOUNTER — Ambulatory Visit: Payer: Self-pay | Admitting: Internal Medicine

## 2014-03-07 ENCOUNTER — Ambulatory Visit: Payer: Self-pay | Attending: Internal Medicine | Admitting: Internal Medicine

## 2014-03-07 ENCOUNTER — Ambulatory Visit (HOSPITAL_COMMUNITY)
Admission: RE | Admit: 2014-03-07 | Discharge: 2014-03-07 | Disposition: A | Discharge status: Home / Self Care | Payer: Self-pay | Source: Ambulatory Visit | Attending: Cardiology | Admitting: Cardiology

## 2014-03-07 ENCOUNTER — Other Ambulatory Visit: Payer: Self-pay

## 2014-03-07 ENCOUNTER — Encounter: Payer: Self-pay | Admitting: Internal Medicine

## 2014-03-07 VITALS — BP 143/95 | HR 87 | Temp 98.2°F | Resp 16

## 2014-03-07 DIAGNOSIS — K219 Gastro-esophageal reflux disease without esophagitis: Secondary | ICD-10-CM | POA: Insufficient documentation

## 2014-03-07 DIAGNOSIS — I10 Essential (primary) hypertension: Secondary | ICD-10-CM | POA: Insufficient documentation

## 2014-03-07 DIAGNOSIS — Z8673 Personal history of transient ischemic attack (TIA), and cerebral infarction without residual deficits: Secondary | ICD-10-CM | POA: Insufficient documentation

## 2014-03-07 DIAGNOSIS — Z79899 Other long term (current) drug therapy: Secondary | ICD-10-CM | POA: Insufficient documentation

## 2014-03-07 DIAGNOSIS — R079 Chest pain, unspecified: Secondary | ICD-10-CM | POA: Insufficient documentation

## 2014-03-07 DIAGNOSIS — Z0000 Encounter for general adult medical examination without abnormal findings: Secondary | ICD-10-CM

## 2014-03-07 DIAGNOSIS — Z87891 Personal history of nicotine dependence: Secondary | ICD-10-CM | POA: Insufficient documentation

## 2014-03-07 DIAGNOSIS — Z1231 Encounter for screening mammogram for malignant neoplasm of breast: Secondary | ICD-10-CM

## 2014-03-07 DIAGNOSIS — Z7982 Long term (current) use of aspirin: Secondary | ICD-10-CM | POA: Insufficient documentation

## 2014-03-07 DIAGNOSIS — F329 Major depressive disorder, single episode, unspecified: Secondary | ICD-10-CM | POA: Insufficient documentation

## 2014-03-07 LAB — CBC WITH DIFFERENTIAL/PLATELET
BASOS PCT: 1 % (ref 0–1)
Basophils Absolute: 0.1 10*3/uL (ref 0.0–0.1)
EOS ABS: 0.1 10*3/uL (ref 0.0–0.7)
Eosinophils Relative: 2 % (ref 0–5)
HCT: 41.4 % (ref 36.0–46.0)
Hemoglobin: 14 g/dL (ref 12.0–15.0)
Lymphocytes Relative: 34 % (ref 12–46)
Lymphs Abs: 1.9 10*3/uL (ref 0.7–4.0)
MCH: 30.5 pg (ref 26.0–34.0)
MCHC: 33.8 g/dL (ref 30.0–36.0)
MCV: 90.2 fL (ref 78.0–100.0)
MPV: 10.5 fL (ref 8.6–12.4)
Monocytes Absolute: 0.4 10*3/uL (ref 0.1–1.0)
Monocytes Relative: 7 % (ref 3–12)
Neutro Abs: 3.2 10*3/uL (ref 1.7–7.7)
Neutrophils Relative %: 56 % (ref 43–77)
PLATELETS: 256 10*3/uL (ref 150–400)
RBC: 4.59 MIL/uL (ref 3.87–5.11)
RDW: 13.6 % (ref 11.5–15.5)
WBC: 5.7 10*3/uL (ref 4.0–10.5)

## 2014-03-07 LAB — COMPLETE METABOLIC PANEL WITH GFR
ALT: 24 U/L (ref 0–35)
AST: 19 U/L (ref 0–37)
Albumin: 4 g/dL (ref 3.5–5.2)
Alkaline Phosphatase: 92 U/L (ref 39–117)
BILIRUBIN TOTAL: 0.9 mg/dL (ref 0.2–1.2)
BUN: 10 mg/dL (ref 6–23)
CHLORIDE: 106 meq/L (ref 96–112)
CO2: 24 meq/L (ref 19–32)
Calcium: 9.2 mg/dL (ref 8.4–10.5)
Creat: 0.66 mg/dL (ref 0.50–1.10)
GFR, Est African American: >89 mL/min
GFR, Est Non African American: >89 mL/min
GLUCOSE: 76 mg/dL (ref 70–99)
Potassium: 4.2 mEq/L (ref 3.5–5.3)
Sodium: 141 mEq/L (ref 135–145)
Total Protein: 6.8 g/dL (ref 6.0–8.3)

## 2014-03-07 LAB — LIPID PANEL
Cholesterol: 250 mg/dL — ABNORMAL HIGH (ref 0–200)
HDL: 66 mg/dL (ref >39)
LDL CALC: 167 mg/dL — AB (ref 0–99)
TRIGLYCERIDES: 84 mg/dL (ref <150)
Total CHOL/HDL Ratio: 3.8 Ratio
VLDL: 17 mg/dL (ref 0–40)

## 2014-03-07 LAB — POCT GLYCOSYLATED HEMOGLOBIN (HGB A1C): Hemoglobin A1C: 5.7

## 2014-03-07 MED ORDER — AMLODIPINE BESYLATE 2.5 MG PO TABS: 2.5000 mg | ORAL_TABLET | Freq: Every day | ORAL | Status: DC

## 2014-03-07 MED ORDER — OMEPRAZOLE 20 MG PO CPDR: 20.0000 mg | DELAYED_RELEASE_CAPSULE | Freq: Every day | ORAL | Status: DC

## 2014-03-07 NOTE — Progress Notes (Signed)
Patient complains of having a pain in her heart that started about one week ago Denies SOB Patient also requesting to have her sugar checked-reccommended by behavioral health

## 2014-03-07 NOTE — Progress Notes (Signed)
MRN: [id number] Name: Lorraine Ryan  Sex: female Age: 59 y.o. DOB: 12/06/1955  Allergies: Codeine; Tramadol; Oxycodone; and Prednisone  Chief Complaint  Patient presents with  . Chest Pain    HPI: Patient is 59 y.o. female who Patient has history of hypertension, anxiety depression/mood disorder/insomnia following up with psychiatrist, chronic atypical chest pain, patient comes today for followup reported to have chest pain for the last one week which is sharp nonradiating persistent sometimes worsening with a left shoulder movement, denies any orthopnea PND or leg swelling, last year in February she had similar symptoms had a stress echo done yesterday conclusion was Dobutamine echo with no chest pain, no ST changes and nostress-induced wall motion abnormalities; normal dobutamine echocardiogram. EKG done today does not show any acute ST abnormalities. Her blood pressure is elevated, as per patient she does not take any blood pressure medications.patient occasionally does report reflux symptoms   Past Medical History  Diagnosis Date  . Hypertension     No meds prescribed; states intermittent  . Back pain   . Stroke     caused numbness in leg    Past Surgical History  Procedure Laterality Date  . Ganglion cyst excision    . Abdominal hysterectomy    . Tonsillectomy        Medication List       This list is accurate as of: 03/07/14  9:59 AM.  Always use your most recent med list.               ABILIFY PO  Take 1 tablet by mouth daily.     amLODipine 2.5 MG tablet  Commonly known as:  NORVASC  Take 1 tablet (2.5 mg total) by mouth daily.     aspirin EC 81 MG tablet  Take 1 tablet (81 mg total) by mouth daily.     BC HEADACHE PO  Take 1 packet by mouth every 6 (six) hours as needed (for headache or pain).     colchicine 0.6 MG tablet  Take 1 tablet (0.6 mg total) by mouth 2 (two) times daily as needed.     gabapentin 100 MG capsule  Commonly known as:   NEURONTIN  Take 1 capsule (100 mg total) by mouth 3 (three) times daily.     omeprazole 20 MG capsule  Commonly known as:  PRILOSEC  Take 1 capsule (20 mg total) by mouth daily.     ondansetron 8 MG tablet  Commonly known as:  ZOFRAN  Take 1 tablet (8 mg total) by mouth every 8 (eight) hours as needed for nausea or vomiting.        Meds ordered this encounter  Medications  . amLODipine (NORVASC) 2.5 MG tablet    Sig: Take 1 tablet (2.5 mg total) by mouth daily.    Dispense:  90 tablet    Refill:  3  . omeprazole (PRILOSEC) 20 MG capsule    Sig: Take 1 capsule (20 mg total) by mouth daily.    Dispense:  30 capsule    Refill:  3    Immunization History  Administered Date(s) Administered  . Influenza,trivalent, recombinat, inj, PF 01/13/2013  . PPD Test 06/02/2013  . Rabies, IM 11/28/2012, 12/01/2012, 12/05/2012, 12/12/2012  . Tdap 11/28/2012    Family History  Problem Relation Age of Onset  . Heart disease      No family history    History  Substance Use Topics  . Smoking status: Former Research scientist (life sciences)  .  Smokeless tobacco: Never Used  . Alcohol Use: No    Review of Systems   As noted in HPI  Filed Vitals:   03/07/14 0933  BP: 143/95  Pulse: 87  Temp: 98.2 F (36.8 C)  Resp: 16    Physical Exam  Physical Exam  Constitutional: No distress.  Eyes: EOM are normal. Pupils are equal, round, and reactive to light.  Cardiovascular: Normal rate and regular rhythm.   No murmur heard. Pulmonary/Chest: Breath sounds normal. No respiratory distress. She has no wheezes. She has no rales.  Abdominal: Soft. She exhibits no distension. There is no tenderness. There is no rebound.  Musculoskeletal: She exhibits no edema.    CBC    Component Value Date/Time   WBC 5.3 05/27/2013 1440   RBC 4.59 05/27/2013 1440   HGB 13.9 05/27/2013 1440   HCT 40.3 05/27/2013 1440   PLT 245 05/27/2013 1440   MCV 87.8 05/27/2013 1440   LYMPHSABS 1.9 05/27/2013 1440   MONOABS 0.4  05/27/2013 1440   EOSABS 0.1 05/27/2013 1440   BASOSABS 0.1 05/27/2013 1440    CMP     Component Value Date/Time   NA 142 05/27/2013 1440   K 4.1 05/27/2013 1440   CL 107 05/27/2013 1440   CO2 30 05/27/2013 1440   GLUCOSE 82 05/27/2013 1440   BUN 11 05/27/2013 1440   CREATININE 0.62 05/27/2013 1440   CREATININE 0.61 03/06/2013 0904   CALCIUM 9.4 05/27/2013 1440   PROT 6.7 05/27/2013 1440   ALBUMIN 4.0 05/27/2013 1440   AST 19 05/27/2013 1440   ALT 20 05/27/2013 1440   ALKPHOS 93 05/27/2013 1440   BILITOT 0.6 05/27/2013 1440   GFRNONAA >90 03/06/2013 0904   GFRAA >90 03/06/2013 0223    Lab Results  Component Value Date/Time   CHOL 218* 05/27/2013 02:40 PM    No components found for: HGA1C  Lab Results  Component Value Date/Time   AST 19 05/27/2013 02:40 PM    Assessment and Plan  Essential hypertension - Plan: I have advised patient for DASH diet, also started on amLODipine (NORVASC) 2.5 MG tablet, COMPLETE METABOLIC PANEL WITH GFR, CBC with Differential/Platelet, TSH, Lipid panel, Vit D  25 hydroxy (rtn osteoporosis monitoring)  Chest pain, unspecified chest pain type - Plan: EKG 12-Lead no acute abnormality, likely atypical, I have sent Troponin I  Preventative health care - Plan: Results for orders placed or performed in visit on 03/07/14  HgB A1c  Result Value Ref Range   Hemoglobin A1C 5.70     HgB A1c is 5.7%, patient does not have diabetes.  Gastroesophageal reflux disease, esophagitis presence not specified - Plan: advised patient for lifestyle modification, trial of omeprazole (PRILOSEC) 20 MG capsule  Encounter for screening mammogram for breast cancer - Plan: MM DIGITAL SCREENING BILATERAL   Health Maintenance  -Mammogram: ordered  -Vaccinations:  Patient declines flu shot   Return in about 3 months (around 06/05/2014) for hypertension, BP check in 2 weeks/Nurse Visit.  [name], MD

## 2014-03-07 NOTE — Patient Instructions (Signed)
DASH Eating Plan °DASH stands for "Dietary Approaches to Stop Hypertension." The DASH eating plan is a healthy eating plan that has been shown to reduce high blood pressure (hypertension). Additional health benefits may include reducing the risk of type 2 diabetes mellitus, heart disease, and stroke. The DASH eating plan may also help with weight loss. °WHAT DO I NEED TO KNOW ABOUT THE DASH EATING PLAN? °For the DASH eating plan, you will follow these general guidelines: °· Choose foods with a percent daily value for sodium of less than 5% (as listed on the food label). °· Use salt-free seasonings or herbs instead of table salt or sea salt. °· Check with your health care provider or pharmacist before using salt substitutes. °· Eat lower-sodium products, often labeled as "lower sodium" or "no salt added." °· Eat fresh foods. °· Eat more vegetables, fruits, and low-fat dairy products. °· Choose whole grains. Look for the word "whole" as the first word in the ingredient list. °· Choose fish and skinless chicken or turkey more often than red meat. Limit fish, poultry, and meat to 6 oz (170 g) each day. °· Limit sweets, desserts, sugars, and sugary drinks. °· Choose heart-healthy fats. °· Limit cheese to 1 oz (28 g) per day. °· Eat more home-cooked food and less restaurant, buffet, and fast food. °· Limit fried foods. °· Cook foods using methods other than frying. °· Limit canned vegetables. If you do use them, rinse them well to decrease the sodium. °· When eating at a restaurant, ask that your food be prepared with less salt, or no salt if possible. °WHAT FOODS CAN I EAT? °Seek help from a dietitian for individual calorie needs. °Grains °Whole grain or whole wheat bread. Brown rice. Whole grain or whole wheat pasta. Quinoa, bulgur, and whole grain cereals. Low-sodium cereals. Corn or whole wheat flour tortillas. Whole grain cornbread. Whole grain crackers. Low-sodium crackers. °Vegetables °Fresh or frozen vegetables  (raw, steamed, roasted, or grilled). Low-sodium or reduced-sodium tomato and vegetable juices. Low-sodium or reduced-sodium tomato sauce and paste. Low-sodium or reduced-sodium canned vegetables.  °Fruits °All fresh, canned (in natural juice), or frozen fruits. °Meat and Other Protein Products °Ground beef (85% or leaner), grass-fed beef, or beef trimmed of fat. Skinless chicken or turkey. Ground chicken or turkey. Pork trimmed of fat. All fish and seafood. Eggs. Dried beans, peas, or lentils. Unsalted nuts and seeds. Unsalted canned beans. °Dairy °Low-fat dairy products, such as skim or 1% milk, 2% or reduced-fat cheeses, low-fat ricotta or cottage cheese, or plain low-fat yogurt. Low-sodium or reduced-sodium cheeses. °Fats and Oils °Tub margarines without trans fats. Light or reduced-fat mayonnaise and salad dressings (reduced sodium). Avocado. Safflower, olive, or canola oils. Natural peanut or almond butter. °Other °Unsalted popcorn and pretzels. °The items listed above may not be a complete list of recommended foods or beverages. Contact your dietitian for more options. °WHAT FOODS ARE NOT RECOMMENDED? °Grains °White bread. White pasta. White rice. Refined cornbread. Bagels and croissants. Crackers that contain trans fat. °Vegetables °Creamed or fried vegetables. Vegetables in a cheese sauce. Regular canned vegetables. Regular canned tomato sauce and paste. Regular tomato and vegetable juices. °Fruits °Dried fruits. Canned fruit in light or heavy syrup. Fruit juice. °Meat and Other Protein Products °Fatty cuts of meat. Ribs, chicken wings, bacon, sausage, bologna, salami, chitterlings, fatback, hot dogs, bratwurst, and packaged luncheon meats. Salted nuts and seeds. Canned beans with salt. °Dairy °Whole or 2% milk, cream, half-and-half, and cream cheese. Whole-fat or sweetened yogurt. Full-fat   cheeses or blue cheese. Nondairy creamers and whipped toppings. Processed cheese, cheese spreads, or cheese  curds. °Condiments °Onion and garlic salt, seasoned salt, table salt, and sea salt. Canned and packaged gravies. Worcestershire sauce. Tartar sauce. Barbecue sauce. Teriyaki sauce. Soy sauce, including reduced sodium. Steak sauce. Fish sauce. Oyster sauce. Cocktail sauce. Horseradish. Ketchup and mustard. Meat flavorings and tenderizers. Bouillon cubes. Hot sauce. Tabasco sauce. Marinades. Taco seasonings. Relishes. °Fats and Oils °Butter, stick margarine, lard, shortening, ghee, and bacon fat. Coconut, palm kernel, or palm oils. Regular salad dressings. °Other °Pickles and olives. Salted popcorn and pretzels. °The items listed above may not be a complete list of foods and beverages to avoid. Contact your dietitian for more information. °WHERE CAN I FIND MORE INFORMATION? °[employer], [employer]: www.nhlbi.nih.gov/health/health-topics/topics/dash/ °Document Released: 01/10/2011 Document Revised: 06/07/2013 Document Reviewed: 11/25/2012 °ExitCare® Patient Information ©2015 [employer]. This information is not intended to replace advice given to you by your health care provider. Make sure you discuss any questions you have with your health care provider. ° °

## 2014-03-08 LAB — TROPONIN I

## 2014-03-08 LAB — VITAMIN D 25 HYDROXY (VIT D DEFICIENCY, FRACTURES): VIT D 25 HYDROXY: 18 ng/mL — AB (ref 30–100)

## 2014-03-08 LAB — TSH: TSH: 1.061 u[IU]/mL (ref 0.350–4.500)

## 2014-03-09 ENCOUNTER — Telehealth: Payer: Self-pay

## 2014-03-09 NOTE — Telephone Encounter (Signed)
-----   Message from [name], MD sent at 03/08/2014 12:33 PM EST ----- Blood work reviewed, noticed low vitamin D, call patient advise to start ergocalciferol 50,000 units once a week for the duration of  12 weeks. Also noticed elevated cholesterol, advise patient for low fat diet.

## 2014-03-09 NOTE — Telephone Encounter (Signed)
Patient has been informed.

## 2014-03-22 ENCOUNTER — Ambulatory Visit: Payer: Self-pay | Attending: Internal Medicine

## 2014-03-22 NOTE — Patient Instructions (Signed)
DASH Eating Plan °DASH stands for "Dietary Approaches to Stop Hypertension." The DASH eating plan is a healthy eating plan that has been shown to reduce high blood pressure (hypertension). Additional health benefits may include reducing the risk of type 2 diabetes mellitus, heart disease, and stroke. The DASH eating plan may also help with weight loss. °WHAT DO I NEED TO KNOW ABOUT THE DASH EATING PLAN? °For the DASH eating plan, you will follow these general guidelines: °· Choose foods with a percent daily value for sodium of less than 5% (as listed on the food label). °· Use salt-free seasonings or herbs instead of table salt or sea salt. °· Check with your health care provider or pharmacist before using salt substitutes. °· Eat lower-sodium products, often labeled as "lower sodium" or "no salt added." °· Eat fresh foods. °· Eat more vegetables, fruits, and low-fat dairy products. °· Choose whole grains. Look for the word "whole" as the first word in the ingredient list. °· Choose fish and skinless chicken or turkey more often than red meat. Limit fish, poultry, and meat to 6 oz (170 g) each day. °· Limit sweets, desserts, sugars, and sugary drinks. °· Choose heart-healthy fats. °· Limit cheese to 1 oz (28 g) per day. °· Eat more home-cooked food and less restaurant, buffet, and fast food. °· Limit fried foods. °· Cook foods using methods other than frying. °· Limit canned vegetables. If you do use them, rinse them well to decrease the sodium. °· When eating at a restaurant, ask that your food be prepared with less salt, or no salt if possible. °WHAT FOODS CAN I EAT? °Seek help from a dietitian for individual calorie needs. °Grains °Whole grain or whole wheat bread. Brown rice. Whole grain or whole wheat pasta. Quinoa, bulgur, and whole grain cereals. Low-sodium cereals. Corn or whole wheat flour tortillas. Whole grain cornbread. Whole grain crackers. Low-sodium crackers. °Vegetables °Fresh or frozen vegetables  (raw, steamed, roasted, or grilled). Low-sodium or reduced-sodium tomato and vegetable juices. Low-sodium or reduced-sodium tomato sauce and paste. Low-sodium or reduced-sodium canned vegetables.  °Fruits °All fresh, canned (in natural juice), or frozen fruits. °Meat and Other Protein Products °Ground beef (85% or leaner), grass-fed beef, or beef trimmed of fat. Skinless chicken or turkey. Ground chicken or turkey. Pork trimmed of fat. All fish and seafood. Eggs. Dried beans, peas, or lentils. Unsalted nuts and seeds. Unsalted canned beans. °Dairy °Low-fat dairy products, such as skim or 1% milk, 2% or reduced-fat cheeses, low-fat ricotta or cottage cheese, or plain low-fat yogurt. Low-sodium or reduced-sodium cheeses. °Fats and Oils °Tub margarines without trans fats. Light or reduced-fat mayonnaise and salad dressings (reduced sodium). Avocado. Safflower, olive, or canola oils. Natural peanut or almond butter. °Other °Unsalted popcorn and pretzels. °The items listed above may not be a complete list of recommended foods or beverages. Contact your dietitian for more options. °WHAT FOODS ARE NOT RECOMMENDED? °Grains °White bread. White pasta. White rice. Refined cornbread. Bagels and croissants. Crackers that contain trans fat. °Vegetables °Creamed or fried vegetables. Vegetables in a cheese sauce. Regular canned vegetables. Regular canned tomato sauce and paste. Regular tomato and vegetable juices. °Fruits °Dried fruits. Canned fruit in light or heavy syrup. Fruit juice. °Meat and Other Protein Products °Fatty cuts of meat. Ribs, chicken wings, bacon, sausage, bologna, salami, chitterlings, fatback, hot dogs, bratwurst, and packaged luncheon meats. Salted nuts and seeds. Canned beans with salt. °Dairy °Whole or 2% milk, cream, half-and-half, and cream cheese. Whole-fat or sweetened yogurt. Full-fat   cheeses or blue cheese. Nondairy creamers and whipped toppings. Processed cheese, cheese spreads, or cheese  curds. °Condiments °Onion and garlic salt, seasoned salt, table salt, and sea salt. Canned and packaged gravies. Worcestershire sauce. Tartar sauce. Barbecue sauce. Teriyaki sauce. Soy sauce, including reduced sodium. Steak sauce. Fish sauce. Oyster sauce. Cocktail sauce. Horseradish. Ketchup and mustard. Meat flavorings and tenderizers. Bouillon cubes. Hot sauce. Tabasco sauce. Marinades. Taco seasonings. Relishes. °Fats and Oils °Butter, stick margarine, lard, shortening, ghee, and bacon fat. Coconut, palm kernel, or palm oils. Regular salad dressings. °Other °Pickles and olives. Salted popcorn and pretzels. °The items listed above may not be a complete list of foods and beverages to avoid. Contact your dietitian for more information. °WHERE CAN I FIND MORE INFORMATION? °[employer], [employer]: www.nhlbi.nih.gov/health/health-topics/topics/dash/ °Document Released: 01/10/2011 Document Revised: 06/07/2013 Document Reviewed: 11/25/2012 °ExitCare® Patient Information ©2015 [employer]. This information is not intended to replace advice given to you by your health care provider. Make sure you discuss any questions you have with your health care provider. ° °

## 2014-03-22 NOTE — Progress Notes (Unsigned)
Patient ID: Lorraine Ryan, female   DOB: Aug 13, 1955, 59 y.o.   MRN: [id number] Pt comes in for blood pressure recheck after started on Amlodipine 2.5 mg tab per last visit Tolerated well, without s/e Compliant with taking medication daily Denies headache,dizziness BP- 101/68 63 Pt encouraged to continue taking medication/exercise

## 2014-03-30 ENCOUNTER — Ambulatory Visit (HOSPITAL_COMMUNITY)
Admission: RE | Admit: 2014-03-30 | Discharge: 2014-03-30 | Disposition: A | Discharge status: Home / Self Care | Payer: Self-pay | Source: Ambulatory Visit | Attending: Internal Medicine | Admitting: Internal Medicine

## 2014-03-30 ENCOUNTER — Ambulatory Visit: Payer: Self-pay | Attending: Internal Medicine

## 2014-03-30 ENCOUNTER — Ambulatory Visit: Payer: Self-pay | Attending: Internal Medicine | Admitting: Internal Medicine

## 2014-03-30 ENCOUNTER — Ambulatory Visit (HOSPITAL_COMMUNITY)
Admission: RE | Admit: 2014-03-30 | Discharge: 2014-03-30 | Disposition: A | Discharge status: Home / Self Care | Payer: Self-pay | Source: Ambulatory Visit | Attending: Cardiology | Admitting: Cardiology

## 2014-03-30 ENCOUNTER — Encounter: Payer: Self-pay | Admitting: Internal Medicine

## 2014-03-30 VITALS — BP 108/76 | HR 76 | Temp 98.0°F | Resp 16 | Wt 265.4 lb

## 2014-03-30 DIAGNOSIS — I498 Other specified cardiac arrhythmias: Secondary | ICD-10-CM

## 2014-03-30 DIAGNOSIS — Z1231 Encounter for screening mammogram for malignant neoplasm of breast: Secondary | ICD-10-CM | POA: Insufficient documentation

## 2014-03-30 DIAGNOSIS — I493 Ventricular premature depolarization: Secondary | ICD-10-CM

## 2014-03-30 DIAGNOSIS — E785 Hyperlipidemia, unspecified: Secondary | ICD-10-CM

## 2014-03-30 DIAGNOSIS — E559 Vitamin D deficiency, unspecified: Secondary | ICD-10-CM

## 2014-03-30 DIAGNOSIS — I4901 Ventricular fibrillation: Secondary | ICD-10-CM

## 2014-03-30 DIAGNOSIS — I10 Essential (primary) hypertension: Secondary | ICD-10-CM

## 2014-03-30 MED ORDER — VITAMIN D (ERGOCALCIFEROL) 1.25 MG (50000 UNIT) PO CAPS: 50000.0000 [IU] | ORAL_CAPSULE | ORAL | Status: DC

## 2014-03-30 MED ORDER — ATENOLOL 25 MG PO TABS: 12.5000 mg | ORAL_TABLET | Freq: Every day | ORAL | Status: DC

## 2014-03-30 NOTE — Patient Instructions (Signed)
Fat and Cholesterol Control Diet Fat and cholesterol levels in your blood and organs are influenced by your diet. High levels of fat and cholesterol may lead to diseases of the heart, small and large blood vessels, gallbladder, liver, and pancreas. CONTROLLING FAT AND CHOLESTEROL WITH DIET Although exercise and lifestyle factors are important, your diet is key. That is because certain foods are known to raise cholesterol and others to lower it. The goal is to balance foods for their effect on cholesterol and more importantly, to replace saturated and trans fat with other types of fat, such as monounsaturated fat, polyunsaturated fat, and omega-3 fatty acids. On average, a person should consume no more than 15 to 17 g of saturated fat daily. Saturated and trans fats are considered "bad" fats, and they will raise LDL cholesterol. Saturated fats are primarily found in animal products such as meats, butter, and cream. However, that does not mean you need to give up all your favorite foods. Today, there are good tasting, low-fat, low-cholesterol substitutes for most of the things you like to eat. Choose low-fat or nonfat alternatives. Choose round or loin cuts of red meat. These types of cuts are lowest in fat and cholesterol. Chicken (without the skin), fish, veal, and ground turkey breast are great choices. Eliminate fatty meats, such as hot dogs and salami. Even shellfish have little or no saturated fat. Have a 3 oz (85 g) portion when you eat lean meat, poultry, or fish. Trans fats are also called "partially hydrogenated oils." They are oils that have been scientifically manipulated so that they are solid at room temperature resulting in a longer shelf life and improved taste and texture of foods in which they are added. Trans fats are found in stick margarine, some tub margarines, cookies, crackers, and baked goods.  When baking and cooking, oils are a great substitute for butter. The monounsaturated oils are  especially beneficial since it is believed they lower LDL and raise HDL. The oils you should avoid entirely are saturated tropical oils, such as coconut and palm.  Remember to eat a lot from food groups that are naturally free of saturated and trans fat, including fish, fruit, vegetables, beans, grains (barley, rice, couscous, bulgur wheat), and pasta (without cream sauces).  IDENTIFYING FOODS THAT LOWER FAT AND CHOLESTEROL  Soluble fiber may lower your cholesterol. This type of fiber is found in fruits such as apples, vegetables such as broccoli, potatoes, and carrots, legumes such as beans, peas, and lentils, and grains such as barley. Foods fortified with plant sterols (phytosterol) may also lower cholesterol. You should eat at least 2 g per day of these foods for a cholesterol lowering effect.  Read package labels to identify low-saturated fats, trans fat free, and low-fat foods at the supermarket. Select cheeses that have only 2 to 3 g saturated fat per ounce. Use a heart-healthy tub margarine that is free of trans fats or partially hydrogenated oil. When buying baked goods (cookies, crackers), avoid partially hydrogenated oils. Breads and muffins should be made from whole grains (whole-wheat or whole oat flour, instead of "flour" or "enriched flour"). Buy non-creamy canned soups with reduced salt and no added fats.  FOOD PREPARATION TECHNIQUES  Never deep-fry. If you must fry, either stir-fry, which uses very little fat, or use non-stick cooking sprays. When possible, broil, bake, or roast meats, and steam vegetables. Instead of putting butter or margarine on vegetables, use lemon and herbs, applesauce, and cinnamon (for squash and sweet potatoes). Use nonfat   yogurt, salsa, and low-fat dressings for salads.  LOW-SATURATED FAT / LOW-FAT FOOD SUBSTITUTES Meats / Saturated Fat (g)  Avoid: Steak, marbled (3 oz/85 g) / 11 g  Choose: Steak, lean (3 oz/85 g) / 4 g  Avoid: Hamburger (3 oz/85 g) / 7  g  Choose: Hamburger, lean (3 oz/85 g) / 5 g  Avoid: Ham (3 oz/85 g) / 6 g  Choose: Ham, lean cut (3 oz/85 g) / 2.4 g  Avoid: Chicken, with skin, dark meat (3 oz/85 g) / 4 g  Choose: Chicken, skin removed, dark meat (3 oz/85 g) / 2 g  Avoid: Chicken, with skin, light meat (3 oz/85 g) / 2.5 g  Choose: Chicken, skin removed, light meat (3 oz/85 g) / 1 g Dairy / Saturated Fat (g)  Avoid: Whole milk (1 cup) / 5 g  Choose: Low-fat milk, 2% (1 cup) / 3 g  Choose: Low-fat milk, 1% (1 cup) / 1.5 g  Choose: Skim milk (1 cup) / 0.3 g  Avoid: Hard cheese (1 oz/28 g) / 6 g  Choose: Skim milk cheese (1 oz/28 g) / 2 to 3 g  Avoid: Cottage cheese, 4% fat (1 cup) / 6.5 g  Choose: Low-fat cottage cheese, 1% fat (1 cup) / 1.5 g  Avoid: Ice cream (1 cup) / 9 g  Choose: Sherbet (1 cup) / 2.5 g  Choose: Nonfat frozen yogurt (1 cup) / 0.3 g  Choose: Frozen fruit bar / trace  Avoid: Whipped cream (1 tbs) / 3.5 g  Choose: Nondairy whipped topping (1 tbs) / 1 g Condiments / Saturated Fat (g)  Avoid: Mayonnaise (1 tbs) / 2 g  Choose: Low-fat mayonnaise (1 tbs) / 1 g  Avoid: Butter (1 tbs) / 7 g  Choose: Extra light margarine (1 tbs) / 1 g  Avoid: Coconut oil (1 tbs) / 11.8 g  Choose: Olive oil (1 tbs) / 1.8 g  Choose: Corn oil (1 tbs) / 1.7 g  Choose: Safflower oil (1 tbs) / 1.2 g  Choose: Sunflower oil (1 tbs) / 1.4 g  Choose: Soybean oil (1 tbs) / 2.4 g  Choose: Canola oil (1 tbs) / 1 g Document Released: 01/21/2005 Document Revised: 05/18/2012 Document Reviewed: 04/21/2013 ExitCare Patient Information 2015 [employer]. This information is not intended to replace advice given to you by your health care provider. Make sure you discuss any questions you have with your health care provider.  

## 2014-03-30 NOTE — Progress Notes (Signed)
MRN: [id number] Name: Lorraine Ryan  Sex: female Age: 59 y.o. DOB: [birth year]  Allergies: Codeine; Tramadol; Oxycodone; and Prednisone  Chief Complaint  Patient presents with  . heart flutter    HPI: Patient is 59 y.o. female who has history of hypertension, recently has been complaining of palpitations, today EKG shows normal sinus rhythm with occasional PVCs, she currently takes Norvasc for blood pressure, last year she was also seen by her cardiologist and also had echocardiogram done  which was negative for any structural abnormality. Had EF of 50-55%. With moderate concentric hypertrophy. And no regional wall motion abnormalities. She occasionally gets these palpitations symptoms which are worse in the morning and at that time she sometimes get headache and dizziness.  Past Medical History  Diagnosis Date  . Hypertension     No meds prescribed; states intermittent  . Back pain   . Stroke     caused numbness in leg    Past Surgical History  Procedure Laterality Date  . Ganglion cyst excision    . Abdominal hysterectomy    . Tonsillectomy        Medication List       This list is accurate as of: 03/30/14 12:35 PM.  Always use your most recent med list.               ABILIFY PO  Take 1 tablet by mouth daily.     aspirin EC 81 MG tablet  Take 1 tablet (81 mg total) by mouth daily.     atenolol 25 MG tablet  Commonly known as:  TENORMIN  Take 0.5 tablets (12.5 mg total) by mouth daily.     BC HEADACHE PO  Take 1 packet by mouth every 6 (six) hours as needed (for headache or pain).     colchicine 0.6 MG tablet  Take 1 tablet (0.6 mg total) by mouth 2 (two) times daily as needed.     gabapentin 100 MG capsule  Commonly known as:  NEURONTIN  Take 1 capsule (100 mg total) by mouth 3 (three) times daily.     omeprazole 20 MG capsule  Commonly known as:  PRILOSEC  Take 1 capsule (20 mg total) by mouth daily.     ondansetron 8 MG tablet  Commonly known  as:  ZOFRAN  Take 1 tablet (8 mg total) by mouth every 8 (eight) hours as needed for nausea or vomiting.     Vitamin D (Ergocalciferol) 50000 UNITS Caps capsule  Commonly known as:  DRISDOL  Take 1 capsule (50,000 Units total) by mouth every 7 (seven) days.        Meds ordered this encounter  Medications  . atenolol (TENORMIN) 25 MG tablet    Sig: Take 0.5 tablets (12.5 mg total) by mouth daily.    Dispense:  45 tablet    Refill:  1  . Vitamin D, Ergocalciferol, (DRISDOL) 50000 UNITS CAPS capsule    Sig: Take 1 capsule (50,000 Units total) by mouth every 7 (seven) days.    Dispense:  12 capsule    Refill:  0    Immunization History  Administered Date(s) Administered  . Influenza,trivalent, recombinat, inj, PF 01/13/2013  . PPD Test 06/02/2013  . Rabies, IM 11/28/2012, 12/01/2012, 12/05/2012, 12/12/2012  . Tdap 11/28/2012    Family History  Problem Relation Age of Onset  . Heart disease      No family history    History  Substance Use Topics  . Smoking status:  Former Smoker  . Smokeless tobacco: Never Used  . Alcohol Use: No    Review of Systems   As noted in HPI  Filed Vitals:   03/30/14 1203  BP: 108/76  Pulse: 76  Temp: 98 F (36.7 C)  Resp: 16    Physical Exam  Physical Exam  Constitutional: No distress.  Eyes: EOM are normal. Pupils are equal, round, and reactive to light.  Cardiovascular: Normal rate and regular rhythm.   Pulmonary/Chest: Breath sounds normal. No respiratory distress. She has no wheezes. She has no rales.  Musculoskeletal: She exhibits no edema.    CBC    Component Value Date/Time   WBC 5.7 03/07/2014 1005   RBC 4.59 03/07/2014 1005   HGB 14.0 03/07/2014 1005   HCT 41.4 03/07/2014 1005   PLT 256 03/07/2014 1005   MCV 90.2 03/07/2014 1005   LYMPHSABS 1.9 03/07/2014 1005   MONOABS 0.4 03/07/2014 1005   EOSABS 0.1 03/07/2014 1005   BASOSABS 0.1 03/07/2014 1005    CMP     Component Value Date/Time   NA 141  03/07/2014 1005   K 4.2 03/07/2014 1005   CL 106 03/07/2014 1005   CO2 24 03/07/2014 1005   GLUCOSE 76 03/07/2014 1005   BUN 10 03/07/2014 1005   CREATININE 0.66 03/07/2014 1005   CREATININE 0.61 03/06/2013 0904   CALCIUM 9.2 03/07/2014 1005   PROT 6.8 03/07/2014 1005   ALBUMIN 4.0 03/07/2014 1005   AST 19 03/07/2014 1005   ALT 24 03/07/2014 1005   ALKPHOS 92 03/07/2014 1005   BILITOT 0.9 03/07/2014 1005   GFRNONAA >89 03/07/2014 1005   GFRNONAA >90 03/06/2013 0904   GFRAA >89 03/07/2014 1005   GFRAA >90 03/06/2013 0904    Lab Results  Component Value Date/Time   CHOL 250* 03/07/2014 10:05 AM    No components found for: HGA1C  Lab Results  Component Value Date/Time   AST 19 03/07/2014 10:05 AM    Assessment and Plan  Fluttering heart /PVC (premature ventricular contraction)- Plan: EKG 12-Lead shows normal sinus rhythm occasional PVCs,I have discontinued her Norvasc and started on low-dose atenolol (TENORMIN) 25 MG tablet, she will take half a pill, also she will reschedule appointment with her cardiologist.patient will come back in 2 weeks for BP check.   Hyperlipidemia I have advised patient for low fat diet.  Essential hypertension - Plan:Discontinued Norvasc, started on  atenolol (TENORMIN) 25 MG tablet  Vitamin D deficiency - Plan: Vitamin D, Ergocalciferol, (DRISDOL) 50000 UNITS CAPS capsule   The patient was given clear instructions to go to ER or return to medical center if symptoms don't improve, worsen or new problems develop. The patient verbalized understanding.    Return in about 3 months (around 06/28/2014), or if symptoms worsen or fail to improve, for hypertension, hyperipidemia, BP check in 2 weeks/Nurse Visit.   This note has been created with Surveyor, quantity. Any transcriptional errors are unintentional.    Lorraine Marek, MD

## 2014-03-30 NOTE — Progress Notes (Signed)
Patient complains of having a flutter in her chest for the past month Patient states she also feels off balance  Has not seen a cardiologist

## 2014-04-19 ENCOUNTER — Ambulatory Visit: Payer: Self-pay | Attending: Internal Medicine | Admitting: *Deleted

## 2014-04-19 VITALS — BP 126/78 | HR 59 | Temp 98.0°F | Resp 14

## 2014-04-19 DIAGNOSIS — Z87891 Personal history of nicotine dependence: Secondary | ICD-10-CM | POA: Insufficient documentation

## 2014-04-19 DIAGNOSIS — I10 Essential (primary) hypertension: Secondary | ICD-10-CM | POA: Insufficient documentation

## 2014-04-19 DIAGNOSIS — R269 Unspecified abnormalities of gait and mobility: Secondary | ICD-10-CM

## 2014-04-19 NOTE — Progress Notes (Signed)
Patient presents for BP check following d/c of norvasc and starting tenormin Med list reviewed; states taking all meds as directed Discussed need for low sodium diet and using Mrs. Dash as alternative to salt Encouraged to choose foods with 5% or less of daily value for sodium. Patient c/o headaches, blurred vision, SHOB, and chest pain intermittently for 2 months States she has not yet made appt with cardiologist Patient c/o unsteadiness while walking. Does not have cane or walker States fell twice in last 30 days.  C/o RLL pain X 2 months; thinks pain is from a previous fall; rates 9/10 at present    BP  126/78 left arm manually with large cuff P 59 R 14  T  98.0 oral SPO2  96%  Per PCP: Make first available appt with cardiologist Referral to PT to evaluate for unsteady gait  Patient advised to call for med refills at least 7 days before running out so as not to go without. Patient aware that she is to f/u with PCP 3 months from last visit (Due 06/28/14)  Patient given literature on DASH Eating Plan Fat and Cholesterol Control Diet

## 2014-04-19 NOTE — Patient Instructions (Signed)
Fat and Cholesterol Control Diet Your diet has an affect on your fat and cholesterol levels in your blood and organs. Too much fat and cholesterol in your blood can affect your:  Heart.  Blood vessels (arteries, veins).  Gallbladder.  Liver.  Pancreas. CONTROL FAT AND CHOLESTEROL WITH DIET Certain foods raise cholesterol and others lower it. It is important to replace bad fats with other types of fat.  Do not eat:  Fatty meats, such as hot dogs and salami.  Stick margarine and some tub margarines that have "partially hydrogenated oils" in them.  Baked goods, such as cookies and crackers that have "partially hydrogenated oils" in them.  Saturated tropical oils, such as coconut and palm oil. Eat the following foods:  Round or loin cuts of red meat.  Chicken (without skin).  Fish.  Veal.  [place] breast.  Shellfish.  Fruit, such as apples.  Vegetables, such as broccoli, potatoes, and carrots.  Beans, peas, and lentils (legumes).  Grains, such as barley, rice, couscous, and bulgar wheat.  Pasta (without cream sauces). Look for foods that are nonfat, low in fat, and low in cholesterol.  FIND FOODS THAT ARE LOWER IN FAT AND CHOLESTEROL  Find foods with soluble fiber and plant sterols (phytosterol). You should eat 2 grams a day of these foods. These foods include:  Fruits.  Vegetables.  Whole grains.  Dried beans and peas.  Nuts and seeds.  Read package labels. Look for low-saturated fats, trans fat free, low-fat foods.  Choose cheese that have only 2 to 3 grams of saturated fat per ounce.  Use heart-healthy tub margarine that is free of trans fat or partially hydrogenated oil.  Avoid buying baked goods that have partially hydrogenated oils in them. Instead, buy baked goods made with whole grains (whole-wheat or whole oat flour). Avoid baked goods labeled with "flour" or "enriched flour."  Buy non-creamy canned soups with reduced salt and no added  fats. PREPARING YOUR FOOD  Broil, bake, steam, or roast foods. Do not fry food.  Use non-stick cooking sprays.  Use lemon or herbs to flavor food instead of using butter or stick margarine.  Use nonfat yogurt, salsa, or low-fat dressings for salads. LOW-SATURATED FAT / LOW-FAT FOOD SUBSTITUTES  Meats / Saturated Fat (g)  Avoid: Steak, marbled (3 oz/85 g) / 11 g.  Choose: Steak, lean (3 oz/85 g) / 4 g.  Avoid: Hamburger (3 oz/85 g) / 7 g.  Choose: Hamburger, lean (3 oz/85 g) / 5 g.  Avoid: Ham (3 oz/85 g) / 6 g.  Choose: Ham, lean cut (3 oz/85 g) / 2.4 g.  Avoid: Chicken, with skin, dark meat (3 oz/85 g) / 4 g.  Choose: Chicken, skin removed, dark meat (3 oz/85 g) / 2 g.  Avoid: Chicken, with skin, light meat (3 oz/85 g) / 2.5 g.  Choose: Chicken, skin removed, light meat (3 oz/85 g) / 1 g. Dairy / Saturated Fat (g)  Avoid: Whole milk (1 cup) / 5 g.  Choose: Low-fat milk, 2% (1 cup) / 3 g.  Choose: Low-fat milk, 1% (1 cup) / 1.5 g.  Choose: Skim milk (1 cup) / 0.3 g.  Avoid: Hard cheese (1 oz/28 g) / 6 g.  Choose: Skim milk cheese (1 oz/28 g) / 2 to 3 g.  Avoid: Cottage cheese, 4% fat (1 cup) / 6.5 g.  Choose: Low-fat cottage cheese, 1% fat (1 cup) / 1.5 g.  Avoid: Ice cream (1 cup) / 9 g.  Choose: Sherbet (1 cup) / 2.5 g.  Choose: Nonfat frozen yogurt (1 cup) / 0.3 g.  Choose: Frozen fruit bar / trace.  Avoid: Whipped cream (1 tbs) / 3.5 g.  Choose: Nondairy whipped topping (1 tbs) / 1 g. Condiments / Saturated Fat (g)  Avoid: Mayonnaise (1 tbs) / 2 g.  Choose: Low-fat mayonnaise (1 tbs) / 1 g.  Avoid: Butter (1 tbs) / 7 g.  Choose: Extra light margarine (1 tbs) / 1 g.  Avoid: Coconut oil (1 tbs) / 11.8 g.  Choose: Olive oil (1 tbs) / 1.8 g.  Choose: Corn oil (1 tbs) / 1.7 g.  Choose: Safflower oil (1 tbs) / 1.2 g.  Choose: Sunflower oil (1 tbs) / 1.4 g.  Choose: Soybean oil (1 tbs) / 2.4 g .  Choose: Canola oil (1 tbs) / 1  g. Document Released: 07/23/2011 Document Revised: 09/23/2012 Document Reviewed: 04/22/2013 ExitCare Patient Information 2015 [employer]. This information is not intended to replace advice given to you by your health care provider. Make sure you discuss any questions you have with your health care provider. DASH Eating Plan DASH stands for "Dietary Approaches to Stop Hypertension." The DASH eating plan is a healthy eating plan that has been shown to reduce high blood pressure (hypertension). Additional health benefits may include reducing the risk of type 2 diabetes mellitus, heart disease, and stroke. The DASH eating plan may also help with weight loss. WHAT DO I NEED TO KNOW ABOUT THE DASH EATING PLAN? For the DASH eating plan, you will follow these general guidelines:  Choose foods with a percent daily value for sodium of less than 5% (as listed on the food label).  Use salt-free seasonings or herbs instead of table salt or sea salt.  Check with your health care provider or pharmacist before using salt substitutes.  Eat lower-sodium products, often labeled as "lower sodium" or "no salt added."  Eat fresh foods.  Eat more vegetables, fruits, and low-fat dairy products.  Choose whole grains. Look for the word "whole" as the first word in the ingredient list.  Choose fish and skinless chicken or turkey more often than red meat. Limit fish, poultry, and meat to 6 oz (170 g) each day.  Limit sweets, desserts, sugars, and sugary drinks.  Choose heart-healthy fats.  Limit cheese to 1 oz (28 g) per day.  Eat more home-cooked food and less restaurant, buffet, and fast food.  Limit fried foods.  Cook foods using methods other than frying.  Limit canned vegetables. If you do use them, rinse them well to decrease the sodium.  When eating at a restaurant, ask that your food be prepared with less salt, or no salt if possible. WHAT FOODS CAN I EAT? Seek help from a dietitian for  individual calorie needs. Grains Whole grain or whole wheat bread. Brown rice. Whole grain or whole wheat pasta. Quinoa, bulgur, and whole grain cereals. Low-sodium cereals. Corn or whole wheat flour tortillas. Whole grain cornbread. Whole grain crackers. Low-sodium crackers. Vegetables Fresh or frozen vegetables (raw, steamed, roasted, or grilled). Low-sodium or reduced-sodium tomato and vegetable juices. Low-sodium or reduced-sodium tomato sauce and paste. Low-sodium or reduced-sodium canned vegetables.  Fruits All fresh, canned (in natural juice), or frozen fruits. Meat and Other Protein Products Ground beef (85% or leaner), grass-fed beef, or beef trimmed of fat. Skinless chicken or turkey. Ground chicken or turkey. Pork trimmed of fat. All fish and seafood. Eggs. Dried beans, peas, or lentils. Unsalted nuts   and seeds. Unsalted canned beans. Dairy Low-fat dairy products, such as skim or 1% milk, 2% or reduced-fat cheeses, low-fat ricotta or cottage cheese, or plain low-fat yogurt. Low-sodium or reduced-sodium cheeses. Fats and Oils Tub margarines without trans fats. Light or reduced-fat mayonnaise and salad dressings (reduced sodium). Avocado. Safflower, olive, or canola oils. Natural peanut or almond butter. Other Unsalted popcorn and pretzels. The items listed above may not be a complete list of recommended foods or beverages. Contact your dietitian for more options. WHAT FOODS ARE NOT RECOMMENDED? Grains White bread. White pasta. White rice. Refined cornbread. Bagels and croissants. Crackers that contain trans fat. Vegetables Creamed or fried vegetables. Vegetables in a cheese sauce. Regular canned vegetables. Regular canned tomato sauce and paste. Regular tomato and vegetable juices. Fruits Dried fruits. Canned fruit in light or heavy syrup. Fruit juice. Meat and Other Protein Products Fatty cuts of meat. Ribs, chicken wings, bacon, sausage, bologna, salami, chitterlings, fatback, hot  dogs, bratwurst, and packaged luncheon meats. Salted nuts and seeds. Canned beans with salt. Dairy Whole or 2% milk, cream, half-and-half, and cream cheese. Whole-fat or sweetened yogurt. Full-fat cheeses or blue cheese. Nondairy creamers and whipped toppings. Processed cheese, cheese spreads, or cheese curds. Condiments Onion and garlic salt, seasoned salt, table salt, and sea salt. Canned and packaged gravies. Worcestershire sauce. Tartar sauce. Barbecue sauce. Teriyaki sauce. Soy sauce, including reduced sodium. Steak sauce. Fish sauce. Oyster sauce. Cocktail sauce. Horseradish. Ketchup and mustard. Meat flavorings and tenderizers. Bouillon cubes. Hot sauce. Tabasco sauce. Marinades. Taco seasonings. Relishes. Fats and Oils Butter, stick margarine, lard, shortening, ghee, and bacon fat. Coconut, palm kernel, or palm oils. Regular salad dressings. Other Pickles and olives. Salted popcorn and pretzels. The items listed above may not be a complete list of foods and beverages to avoid. Contact your dietitian for more information. WHERE CAN I FIND MORE INFORMATION? National Heart, Lung, and [employer] Institute: www.nhlbi.nih.gov/health/health-topics/topics/dash/ Document Released: 01/10/2011 Document Revised: 06/07/2013 Document Reviewed: 11/25/2012 ExitCare Patient Information 2015 [employer]. This information is not intended to replace advice given to you by your health care provider. Make sure you discuss any questions you have with your health care provider.  

## 2014-04-20 ENCOUNTER — Encounter (HOSPITAL_COMMUNITY): Payer: Self-pay | Admitting: Family Medicine

## 2014-04-20 ENCOUNTER — Emergency Department (HOSPITAL_COMMUNITY)
Admission: EM | Admit: 2014-04-20 | Discharge: 2014-04-20 | Discharge status: Against Medical Advice | Payer: Self-pay | Attending: Emergency Medicine | Admitting: Emergency Medicine

## 2014-04-20 ENCOUNTER — Encounter: Payer: Self-pay | Admitting: Cardiology

## 2014-04-20 ENCOUNTER — Ambulatory Visit: Payer: Self-pay | Attending: Cardiology | Admitting: Cardiology

## 2014-04-20 VITALS — BP 149/81 | HR 73 | Resp 20 | Ht 66.0 in | Wt 263.0 lb

## 2014-04-20 DIAGNOSIS — I498 Other specified cardiac arrhythmias: Secondary | ICD-10-CM

## 2014-04-20 DIAGNOSIS — R42 Dizziness and giddiness: Secondary | ICD-10-CM | POA: Insufficient documentation

## 2014-04-20 DIAGNOSIS — I493 Ventricular premature depolarization: Secondary | ICD-10-CM

## 2014-04-20 DIAGNOSIS — Y998 Other external cause status: Secondary | ICD-10-CM | POA: Insufficient documentation

## 2014-04-20 DIAGNOSIS — I4901 Ventricular fibrillation: Secondary | ICD-10-CM

## 2014-04-20 DIAGNOSIS — R079 Chest pain, unspecified: Secondary | ICD-10-CM | POA: Insufficient documentation

## 2014-04-20 DIAGNOSIS — R110 Nausea: Secondary | ICD-10-CM | POA: Insufficient documentation

## 2014-04-20 DIAGNOSIS — E785 Hyperlipidemia, unspecified: Secondary | ICD-10-CM

## 2014-04-20 DIAGNOSIS — S0990XA Unspecified injury of head, initial encounter: Secondary | ICD-10-CM | POA: Insufficient documentation

## 2014-04-20 DIAGNOSIS — I951 Orthostatic hypotension: Secondary | ICD-10-CM

## 2014-04-20 DIAGNOSIS — I10 Essential (primary) hypertension: Secondary | ICD-10-CM | POA: Insufficient documentation

## 2014-04-20 DIAGNOSIS — R072 Precordial pain: Secondary | ICD-10-CM

## 2014-04-20 DIAGNOSIS — Y9389 Activity, other specified: Secondary | ICD-10-CM | POA: Insufficient documentation

## 2014-04-20 DIAGNOSIS — Y92811 Bus as the place of occurrence of the external cause: Secondary | ICD-10-CM | POA: Insufficient documentation

## 2014-04-20 DIAGNOSIS — R002 Palpitations: Secondary | ICD-10-CM | POA: Insufficient documentation

## 2014-04-20 DIAGNOSIS — W1839XA Other fall on same level, initial encounter: Secondary | ICD-10-CM | POA: Insufficient documentation

## 2014-04-20 MED ORDER — ATENOLOL 25 MG PO TABS: 25.0000 mg | ORAL_TABLET | Freq: Every day | ORAL | Status: DC

## 2014-04-20 NOTE — ED Notes (Signed)
Pt called to move to room and no answer x1

## 2014-04-20 NOTE — Assessment & Plan Note (Signed)
Not well-controlled. I've increased her atenolol 25 mg a day. If she does not tolerate this, or if her blood pressures not under good control, I recommended ACE inhibitor or ARB.

## 2014-04-20 NOTE — Assessment & Plan Note (Signed)
Her total cholesterol is high as is her LDL. Her triglycerides are normal and her HDL is high giving her a good total cholesterol HDL ratio. I would not start a statin but recommend low saturated fat diet and weight loss.

## 2014-04-20 NOTE — Progress Notes (Signed)
Patient complaining of chest pain x1 month that is left-sided and constant She indicates when pain is "more sharp" she has difficulty moving her left arm Patient also complains of headache (8/10 at this time), shortness of breath, dizziness, and swelling of feet Patient indicates she has had 3 falls in the last month. Patient has been referred to PT.  Does not have cane or walker.

## 2014-04-20 NOTE — ED Notes (Signed)
Called x 2 no answer

## 2014-04-20 NOTE — Progress Notes (Signed)
HPI Lorraine Ryan comes in today referred by Dr.Advani for the evaluation of dizziness with falls, palpitations, PVCs and hypertension. She's also been having some chest pain.  She had an extensive evaluation in February 2015 by Dr. Stanford Breed of cardiology. EKG was normal, echocardiogram was normal except for moderate concentric left ventricular hypertrophy, and dobutamine echo was normal. Chest CT showed no pulmonary embolus. Reassurance was given.  She has hypertension and is obese. LDL was 167 recently but her HDL is in the 60s. Triglycerides are surprisingly normal. She is not a diabetic. She does not smoke.  She has well localized sharp chest pain at the lower left sternal border. It history spontaneously but seems to be worse with moving her left arm. If she holds her left arm still goes away.  She has intermittent palpitation and has for years. She does note getting lightheaded with standing. She has fainted several times. She does not drink enough water. She reportedly was to be taking Norvasc in the past but denies this. It is not on her med list. She was started on 12.5 mg of atenolol last week. Resting heart rate was 101 and now is in the mid 70s.  Past Medical History  Diagnosis Date  . Hypertension     No meds prescribed; states intermittent  . Back pain   . Stroke     caused numbness in leg    Current Outpatient Prescriptions  Medication Sig Dispense Refill  . ARIPiprazole (ABILIFY PO) Take 1 tablet by mouth daily.    Marland Kitchen atenolol (TENORMIN) 25 MG tablet Take 1 tablet (25 mg total) by mouth daily. 30 tablet 11  . buPROPion (WELLBUTRIN XL) 150 MG 24 hr tablet Take 150 mg by mouth daily.    Marland Kitchen gabapentin (NEURONTIN) 100 MG capsule Take 1 capsule (100 mg total) by mouth 3 (three) times daily. 90 capsule 3  . omeprazole (PRILOSEC) 20 MG capsule Take 1 capsule (20 mg total) by mouth daily. 30 capsule 3  . traZODone (DESYREL) 100 MG tablet Take 100 mg by mouth at bedtime.    . Vitamin D,  Ergocalciferol, (DRISDOL) 50000 UNITS CAPS capsule Take 1 capsule (50,000 Units total) by mouth every 7 (seven) days. 12 capsule 0  . aspirin EC 81 MG tablet Take 1 tablet (81 mg total) by mouth daily. (Patient not taking: Reported on 04/20/2014) 90 tablet 3  . Aspirin-Salicylamide-Caffeine (BC HEADACHE PO) Take 1 packet by mouth every 6 (six) hours as needed (for headache or pain).    . colchicine 0.6 MG tablet Take 1 tablet (0.6 mg total) by mouth 2 (two) times daily as needed. (Patient not taking: Reported on 03/22/2014) 60 tablet 0  . ondansetron (ZOFRAN) 8 MG tablet Take 1 tablet (8 mg total) by mouth every 8 (eight) hours as needed for nausea or vomiting. (Patient not taking: Reported on 03/22/2014) 12 tablet 0   No current facility-administered medications for this visit.    Allergies  Allergen Reactions  . Codeine     Headache  . Tramadol Nausea Only  . Oxycodone Nausea Only and Palpitations  . Prednisone Other (See Comments)    Headache    Family History  Problem Relation Age of Onset  . Heart disease      No family history  . Diabetes Mother   . Hypertension Mother     History   Social History  . Marital Status: Divorced    Spouse Name: N/A  . Number of Children: 5  .  Years of Education: N/A   Occupational History  . Not on file.   Social History Main Topics  . Smoking status: Former Research scientist (life sciences)  . Smokeless tobacco: Never Used  . Alcohol Use: No  . Drug Use: No  . Sexual Activity: Not on file   Other Topics Concern  . Not on file   Social History Narrative    ROS ALL NEGATIVE EXCEPT THOSE NOTED IN HPI  PE  General Appearance: well developed, well nourished in no acute distress, obese HEENT: symmetrical face, PERRLA, good dentition  Neck: no JVD, thyromegaly, or adenopathy, trachea midline Chest: symmetric without deformity Cardiac: PMI poorly appreciated, RRR, normal S1, S2, no gallop or murmur Lung: clear to ausculation and percussion Vascular: all  pulses full without bruits  Abdominal: nondistended, nontender, good bowel sounds, no HSM, no bruits Extremities: no cyanosis, clubbing or edema, no sign of DVT, no varicosities  Skin: normal color, no rashes Neuro: alert and oriented x 3, non-focal Pysch: Flat affect  EKG Not repeated. Last EKG shows sinus tachycardia rate of 101 bpm, normal otherwise. BMET    Component Value Date/Time   NA 141 03/07/2014 1005   K 4.2 03/07/2014 1005   CL 106 03/07/2014 1005   CO2 24 03/07/2014 1005   GLUCOSE 76 03/07/2014 1005   BUN 10 03/07/2014 1005   CREATININE 0.66 03/07/2014 1005   CREATININE 0.61 03/06/2013 0904   CALCIUM 9.2 03/07/2014 1005   GFRNONAA >89 03/07/2014 1005   GFRNONAA >90 03/06/2013 0904   GFRAA >89 03/07/2014 1005   GFRAA >90 03/06/2013 0904    Lipid Panel     Component Value Date/Time   CHOL 250* 03/07/2014 1005   TRIG 84 03/07/2014 1005   HDL 66 03/07/2014 1005   CHOLHDL 3.8 03/07/2014 1005   VLDL 17 03/07/2014 1005   LDLCALC 167* 03/07/2014 1005    CBC    Component Value Date/Time   WBC 5.7 03/07/2014 1005   RBC 4.59 03/07/2014 1005   HGB 14.0 03/07/2014 1005   HCT 41.4 03/07/2014 1005   PLT 256 03/07/2014 1005   MCV 90.2 03/07/2014 1005   MCH 30.5 03/07/2014 1005   MCHC 33.8 03/07/2014 1005   RDW 13.6 03/07/2014 1005   LYMPHSABS 1.9 03/07/2014 1005   MONOABS 0.4 03/07/2014 1005   EOSABS 0.1 03/07/2014 1005   BASOSABS 0.1 03/07/2014 1005

## 2014-04-20 NOTE — Patient Instructions (Addendum)
It was great meeting you today. Your atenolol dose has been increased to 25 mg daily.  Script sent to [facility]. You have orthostatic hypotension which means your blood pressure drops whenever you stand too quickly.  Stand up slowly from a lying or sitting position to prevent falls.  Please double your water intake and decrease your caffeine intake.
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
## 2014-04-20 NOTE — Assessment & Plan Note (Signed)
This is clearly musculoskeletal. I have suggested avoiding exacerbating activities and to wear a supportive bra. Reassurance given. Also reviewed all the previous negative workup done last year. I emphasized the importance of keeping her blood pressure control to avoid a future risk of stroke, heart attack or kidney disease.

## 2014-04-20 NOTE — Assessment & Plan Note (Signed)
I think this is related to her dizzy spells and potential finding. Some of this may be due to her psychotropic drugs but she probably also does not drink enough water. We have increased her atenolol to 25 mg a day for her palpitations, history of PVCs, hypertension and to help mitigate any symptoms of vasovagal syncope. We'll have to monitor her history of depression with the increased dose of the beta blocker, however, is still a very low dose. If she does not tolerate this. I would probably go next to a ACE inhibitor or ARB.

## 2014-04-26 ENCOUNTER — Emergency Department (HOSPITAL_COMMUNITY)
Admission: EM | Admit: 2014-04-26 | Discharge: 2014-04-26 | Disposition: A | Discharge status: Home / Self Care | Payer: Self-pay | Attending: Emergency Medicine | Admitting: Emergency Medicine

## 2014-04-26 ENCOUNTER — Encounter (HOSPITAL_COMMUNITY): Payer: Self-pay | Admitting: Neurology

## 2014-04-26 DIAGNOSIS — Y998 Other external cause status: Secondary | ICD-10-CM | POA: Insufficient documentation

## 2014-04-26 DIAGNOSIS — S0990XA Unspecified injury of head, initial encounter: Secondary | ICD-10-CM | POA: Insufficient documentation

## 2014-04-26 DIAGNOSIS — W2209XA Striking against other stationary object, initial encounter: Secondary | ICD-10-CM | POA: Insufficient documentation

## 2014-04-26 DIAGNOSIS — R519 Headache, unspecified: Secondary | ICD-10-CM

## 2014-04-26 DIAGNOSIS — Z87891 Personal history of nicotine dependence: Secondary | ICD-10-CM | POA: Insufficient documentation

## 2014-04-26 DIAGNOSIS — I10 Essential (primary) hypertension: Secondary | ICD-10-CM | POA: Insufficient documentation

## 2014-04-26 DIAGNOSIS — Z8673 Personal history of transient ischemic attack (TIA), and cerebral infarction without residual deficits: Secondary | ICD-10-CM | POA: Insufficient documentation

## 2014-04-26 DIAGNOSIS — Y92410 Unspecified street and highway as the place of occurrence of the external cause: Secondary | ICD-10-CM | POA: Insufficient documentation

## 2014-04-26 DIAGNOSIS — Z79899 Other long term (current) drug therapy: Secondary | ICD-10-CM | POA: Insufficient documentation

## 2014-04-26 DIAGNOSIS — R51 Headache: Secondary | ICD-10-CM

## 2014-04-26 DIAGNOSIS — Y9389 Activity, other specified: Secondary | ICD-10-CM | POA: Insufficient documentation

## 2014-04-26 DIAGNOSIS — R110 Nausea: Secondary | ICD-10-CM | POA: Insufficient documentation

## 2014-04-26 MED ORDER — OXYCODONE-ACETAMINOPHEN 5-325 MG PO TABS: 1.0000 | ORAL_TABLET | Freq: Four times a day (QID) | ORAL | Status: DC | PRN

## 2014-04-26 MED ORDER — ACETAMINOPHEN 325 MG PO TABS
650.0000 mg | ORAL_TABLET | Freq: Once | ORAL | Status: AC
  Administered 2014-04-26: 650 mg via ORAL
  Filled 2014-04-26: qty 2

## 2014-04-26 MED ORDER — OXYCODONE-ACETAMINOPHEN 5-325 MG PO TABS
1.0000 | ORAL_TABLET | Freq: Once | ORAL | Status: AC
Start: 2014-04-26 — End: 2014-04-26
  Administered 2014-04-26: 1 via ORAL
  Filled 2014-04-26: qty 1

## 2014-04-26 MED ORDER — METOCLOPRAMIDE HCL 5 MG/ML IJ SOLN
5.0000 mg | Freq: Once | INTRAMUSCULAR | Status: AC
  Administered 2014-04-26: 5 mg via INTRAVENOUS
  Filled 2014-04-26: qty 2

## 2014-04-26 MED ORDER — DIPHENHYDRAMINE HCL 50 MG/ML IJ SOLN
12.5000 mg | Freq: Once | INTRAMUSCULAR | Status: AC
  Administered 2014-04-26: 12.5 mg via INTRAVENOUS
  Filled 2014-04-26: qty 1

## 2014-04-26 MED ORDER — SODIUM CHLORIDE 0.9 % IV BOLUS (SEPSIS)
500.0000 mL | Freq: Once | INTRAVENOUS | Status: AC
  Administered 2014-04-26: 500 mL via INTRAVENOUS
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
## 2014-05-04 ENCOUNTER — Ambulatory Visit: Payer: Self-pay | Attending: Internal Medicine | Admitting: Internal Medicine

## 2014-05-04 ENCOUNTER — Encounter: Payer: Self-pay | Admitting: Internal Medicine

## 2014-05-04 VITALS — BP 129/84 | HR 71 | Temp 97.7°F | Resp 16 | Wt 263.6 lb

## 2014-05-04 DIAGNOSIS — Z90710 Acquired absence of both cervix and uterus: Secondary | ICD-10-CM | POA: Insufficient documentation

## 2014-05-04 DIAGNOSIS — E559 Vitamin D deficiency, unspecified: Secondary | ICD-10-CM | POA: Insufficient documentation

## 2014-05-04 DIAGNOSIS — Z87891 Personal history of nicotine dependence: Secondary | ICD-10-CM | POA: Insufficient documentation

## 2014-05-04 DIAGNOSIS — R519 Headache, unspecified: Secondary | ICD-10-CM

## 2014-05-04 DIAGNOSIS — Z79899 Other long term (current) drug therapy: Secondary | ICD-10-CM | POA: Insufficient documentation

## 2014-05-04 DIAGNOSIS — M79604 Pain in right leg: Secondary | ICD-10-CM

## 2014-05-04 DIAGNOSIS — M549 Dorsalgia, unspecified: Secondary | ICD-10-CM | POA: Insufficient documentation

## 2014-05-04 DIAGNOSIS — R51 Headache: Secondary | ICD-10-CM | POA: Insufficient documentation

## 2014-05-04 DIAGNOSIS — G8929 Other chronic pain: Secondary | ICD-10-CM | POA: Insufficient documentation

## 2014-05-04 DIAGNOSIS — Z7982 Long term (current) use of aspirin: Secondary | ICD-10-CM | POA: Insufficient documentation

## 2014-05-04 DIAGNOSIS — I10 Essential (primary) hypertension: Secondary | ICD-10-CM | POA: Insufficient documentation

## 2014-05-04 DIAGNOSIS — I69998 Other sequelae following unspecified cerebrovascular disease: Secondary | ICD-10-CM | POA: Insufficient documentation

## 2014-05-04 DIAGNOSIS — M79661 Pain in right lower leg: Secondary | ICD-10-CM | POA: Insufficient documentation
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
## 2014-05-12 ENCOUNTER — Encounter: Payer: Self-pay | Admitting: Gastroenterology

## 2014-05-18 ENCOUNTER — Ambulatory Visit: Payer: Self-pay | Admitting: Internal Medicine

## 2014-05-18 ENCOUNTER — Ambulatory Visit: Payer: Self-pay | Attending: Internal Medicine

## 2014-05-18 DIAGNOSIS — R531 Weakness: Secondary | ICD-10-CM | POA: Insufficient documentation

## 2014-05-18 DIAGNOSIS — R269 Unspecified abnormalities of gait and mobility: Secondary | ICD-10-CM | POA: Insufficient documentation
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
## 2014-05-25 ENCOUNTER — Ambulatory Visit: Payer: Self-pay | Admitting: Physical Therapy

## 2014-05-25 ENCOUNTER — Ambulatory Visit: Payer: Self-pay

## 2014-05-25 VITALS — BP 152/100 | HR 89

## 2014-05-25 DIAGNOSIS — R269 Unspecified abnormalities of gait and mobility: Secondary | ICD-10-CM

## 2014-05-25 NOTE — Patient Instructions (Signed)
Stroke Prevention Some medical conditions and behaviors are associated with an increased chance of having a stroke. You may prevent a stroke by making healthy choices and managing medical conditions. HOW CAN I REDUCE MY RISK OF HAVING A STROKE?   Stay physically active. Get at least 30 minutes of activity on most or all days.  Do not smoke. It may also be helpful to avoid exposure to secondhand smoke.  Limit alcohol use. Moderate alcohol use is considered to be:  No more than 2 drinks per day for men.  No more than 1 drink per day for nonpregnant women.  Eat healthy foods. This involves:  Eating 5 or more servings of fruits and vegetables a day.  Making dietary changes that address high blood pressure (hypertension), high cholesterol, diabetes, or obesity.  Manage your cholesterol levels.  Making food choices that are high in fiber and low in saturated fat, trans fat, and cholesterol may control cholesterol levels.  Take any prescribed medicines to control cholesterol as directed by your health care provider.  Manage your diabetes.  Controlling your carbohydrate and sugar intake is recommended to manage diabetes.  Take any prescribed medicines to control diabetes as directed by your health care provider.  Control your hypertension.  Making food choices that are low in salt (sodium), saturated fat, trans fat, and cholesterol is recommended to manage hypertension.  Take any prescribed medicines to control hypertension as directed by your health care provider.  Maintain a healthy weight.  Reducing calorie intake and making food choices that are low in sodium, saturated fat, trans fat, and cholesterol are recommended to manage weight.  Stop drug abuse.  Avoid taking birth control pills.  Talk to your health care provider about the risks of taking birth control pills if you are over 35 years old, smoke, get migraines, or have ever had a blood clot.  Get evaluated for sleep  disorders (sleep apnea).  Talk to your health care provider about getting a sleep evaluation if you snore a lot or have excessive sleepiness.  Take medicines only as directed by your health care provider.  For some people, aspirin or blood thinners (anticoagulants) are helpful in reducing the risk of forming abnormal blood clots that can lead to stroke. If you have the irregular heart rhythm of atrial fibrillation, you should be on a blood thinner unless there is a good reason you cannot take them.  Understand all your medicine instructions.  Make sure that other conditions (such as anemia or atherosclerosis) are addressed. SEEK IMMEDIATE MEDICAL CARE IF:   You have sudden weakness or numbness of the face, arm, or leg, especially on one side of the body.  Your face or eyelid droops to one side.  You have sudden confusion.  You have trouble speaking (aphasia) or understanding.  You have sudden trouble seeing in one or both eyes.  You have sudden trouble walking.  You have dizziness.  You have a loss of balance or coordination.  You have a sudden, severe headache with no known cause.  You have new chest pain or an irregular heartbeat. Any of these symptoms may represent a serious problem that is an emergency. Do not wait to see if the symptoms will go away. Get medical help at once. Call your local emergency services (911 in [place] Do not drive yourself to the hospital. Document Released: 02/29/2004 Document Revised: 06/07/2013 Document Reviewed: 07/24/2012 ExitCare Patient Information 2015 [employer]. This information is not intended to replace advice given   to you by your health care provider. Make sure you discuss any questions you have with your health care provider.  

## 2014-05-25 NOTE — Therapy (Signed)
[facility] [address] [place], [place], [address] Phone: [phone]   Fax:  [phone]  Physical Therapy Treatment  Patient Details  Name: Lorraine Ryan MRN: [id number] Date of Birth: [birth year] Referring Provider:  [name], MD  Encounter Date: 05/25/2014      PT End of Session - 05/25/14 1126    Visit Number 2   Number of Visits 9   Date for PT Re-Evaluation 06/17/14   Authorization Type No insurance-[facility] assistance   PT Start Time 1021   PT Stop Time 1101   PT Time Calculation (min) 40 min   Activity Tolerance Treatment limited secondary to medical complications (Comment)  Increased BP   Behavior During Therapy Flat affect      Past Medical History  Diagnosis Date  . Hypertension     No meds prescribed; states intermittent  . Back pain   . Stroke     caused numbness in leg    Past Surgical History  Procedure Laterality Date  . Ganglion cyst excision    . Abdominal hysterectomy    . Tonsillectomy      Filed Vitals:   05/25/14 1027 05/25/14 1040 05/25/14 1042 05/25/14 1045  BP: 146/91 129/102 138/115 152/100  Pulse: 68 89      Visit Diagnosis:  Abnormality of gait      Subjective Assessment - 05/25/14 1027    Subjective Pt reports having a fall on 4/16, landing on one knee and hitting head.  Didnt go to MD.   Pertinent History CVA, HTN, orthostatic hypotension, chronic back pain   Patient Stated Goals Walk without stumbling   Currently in Pain? Yes   Pain Score 7    Pain Location Hip   Pain Orientation Right   Pain Descriptors / Indicators Aching   Pain Type Chronic pain   Pain Onset More than a month ago   Pain Frequency Constant   Aggravating Factors  at night   Pain Relieving Factors not sure      Performed orthostatic BP checks as above with pt supine, standing x 1 minute, standing x 3 minutes and sitting (by manual cuff). Discussed increased BP and deferring  exercises today during treatment secondary to BP.  Pt denies headache at present or dizziness.                            PT Education - 05/25/14 1125    Education provided Yes   Education Details Increased BP, CVA Education   Person(s) Educated Patient   Methods Explanation;Handout   Comprehension Verbalized understanding;Need further instruction          PT Short Term Goals - 05/18/14 1307    PT SHORT TERM GOAL #1   Title Same as LTGs.           PT Long Term Goals - 05/18/14 1307    PT LONG TERM GOAL #1   Title Pt will be independent in HEP to improve functional mobility. Target date: 06/15/14.   Status New   PT LONG TERM GOAL #2   Title Pt will verbalize understanding of fall prevention strategies to decrease falls risk. Target date: 06/15/14.   Status New   PT LONG TERM GOAL #3   Title Pt will improve BERG score to >/=49/56 to reduce falls risk. Target date: 06/15/14.   Status New   PT LONG TERM GOAL #4   Title Pt will ambulate  600' over even/uneven terrain with LRAD at MOD I level to improve functional mobility. Target date: 06/15/14.   Status New   PT LONG TERM GOAL #5   Title Pt will report no falls in the last 4 weeks to improve safety during functional mobility. Target date: 06/15/14.   Status New               Plan - 05/25/14 1127    Clinical Impression Statement Pt with increased BP today.  Discussed with Jamey Reas, PT, who also checked BP and assessed pt.  Educated pt on CVA warning signs/symptoms and risk factors.  Pt did experience hip pain today so not sure how that was impacting her BP.   Pt will benefit from skilled therapeutic intervention in order to improve on the following deficits Abnormal gait;Decreased endurance;Decreased strength;Decreased mobility;Decreased knowledge of use of DME;Decreased balance;Pain;Impaired sensation   Rehab Potential Good   PT Frequency 2x / week   PT Duration 4 weeks   PT Next Visit Plan  Check BP and initiate HEP for balance and strength if BP allows.   Consulted and Agree with Plan of Care Patient        Problem List Patient Active Problem List   Diagnosis Date Noted  . Orthostatic hypotension 04/20/2014  . Hyperlipidemia 04/20/2014  . Low back pain radiating to right leg 03/01/2013  . Numbness in right leg 03/01/2013  . Chest pain 03/01/2013  . Hypertension   . Back pain   . Stroke     Narda Bonds 05/25/2014, 11:29 AM  [facility] [address] [place], [place], [address] Phone: [phone]   Fax:  [place], [place] 05/25/2014 11:30 AM Phone: [phone] Fax: [phone]

## 2014-05-26 ENCOUNTER — Ambulatory Visit: Payer: Self-pay | Admitting: Physical Therapy

## 2014-06-01 ENCOUNTER — Ambulatory Visit: Payer: Self-pay

## 2014-06-01 ENCOUNTER — Ambulatory Visit: Payer: Self-pay | Admitting: Gastroenterology

## 2014-06-01 ENCOUNTER — Ambulatory Visit: Payer: Self-pay | Admitting: Internal Medicine

## 2014-06-01 ENCOUNTER — Telehealth: Payer: Self-pay | Admitting: Gastroenterology

## 2014-06-01 ENCOUNTER — Encounter: Payer: Self-pay | Admitting: Gastroenterology

## 2014-06-01 NOTE — Telephone Encounter (Signed)
PATIENT WAS A NO SHOW AND LETTER WAS SENT  °

## 2014-06-03 ENCOUNTER — Ambulatory Visit: Payer: Self-pay | Admitting: Physical Therapy

## 2014-06-08 ENCOUNTER — Ambulatory Visit: Payer: Self-pay | Attending: Internal Medicine

## 2014-06-08 VITALS — BP 116/75 | HR 68

## 2014-06-08 DIAGNOSIS — R531 Weakness: Secondary | ICD-10-CM | POA: Insufficient documentation

## 2014-06-08 DIAGNOSIS — R269 Unspecified abnormalities of gait and mobility: Secondary | ICD-10-CM | POA: Insufficient documentation

## 2014-06-08 NOTE — Therapy (Signed)
[facility] [address] [place], [place], [address] Phone: [phone]   Fax:  [phone]  Physical Therapy Treatment  Patient Details  Name: Lorraine Ryan MRN: [id number] Date of Birth: 1955/10/29 Referring Provider:  [name], MD  Encounter Date: 06/08/2014      PT End of Session - 06/08/14 1248    Visit Number 3   Number of Visits 9   Date for PT Re-Evaluation 06/17/14   Authorization Type No insurance-[facility] assistance   PT Start Time 0931   PT Stop Time 1014   PT Time Calculation (min) 43 min   Equipment Utilized During Treatment Gait belt   Activity Tolerance Patient tolerated treatment well   Behavior During Therapy Flat affect  occasional smiles/talking to herself      Past Medical History  Diagnosis Date  . Hypertension     No meds prescribed; states intermittent  . Back pain   . Stroke     caused numbness in leg    Past Surgical History  Procedure Laterality Date  . Ganglion cyst excision    . Abdominal hysterectomy    . Tonsillectomy      Filed Vitals:   06/08/14 0936 06/08/14 0942  BP: 115/80 116/75  Pulse: 62 68    Visit Diagnosis:  Abnormality of gait - Plan: PT plan of care cert/re-cert  Generalized weakness - Plan: PT plan of care cert/re-cert      Subjective Assessment - 06/08/14 0936    Subjective Pt denied falls but did state she had a few stumbles since last visit. Pt did not go to MD or notify him of increased BP. Pt reported R LE N/T.   Pertinent History CVA, HTN, orthostatic hypotension, chronic back pain   Patient Stated Goals Walk without stumbling   Currently in Pain? Yes   Pain Score 7    Pain Location Hip   Pain Orientation Right   Pain Descriptors / Indicators Aching   Pain Type Chronic pain   Pain Radiating Towards R LE   Pain Onset More than a month ago   Pain Frequency Constant   Aggravating Factors  worse at night   Pain Relieving Factors hold  still                         [facility] Adult PT Treatment/Exercise - 06/08/14 0943    Ambulation/Gait   Ambulation/Gait Yes   Ambulation/Gait Assistance 5: Supervision;4: Min guard   Ambulation/Gait Assistance Details Cues to improve stride length and B heel strike, cues to also ambulate in straight path vs. deviating from path.   Ambulation Distance (Feet) --  28' and 23' without AD, 200' with rollator   Assistive device None;4-wheeled walker   Gait Pattern Step-through pattern;Decreased stride length;Decreased dorsiflexion - right;Decreased dorsiflexion - left;Decreased weight shift to right;Antalgic;Ataxic   Ambulation Surface Level;Indoor   Balance   Balance Assessed Yes   Static Standing Balance   Static Standing - Balance Support No upper extremity supported   Static Standing - Level of Assistance 5: Stand by assistance;Other (comment)  min guard   Static Standing - Comment/# of Minutes Performed in corner with a chair in front for safety; B LEs, 2-3 sets with 10-30 second holds on non-compliant surface: feet apart/together with eyes closed, feet apart/together with head turns, tandem stance, single leg stance. Cues for technique.   Dynamic Standing Balance   Dynamic Standing - Balance Support No upper extremity  supported   Dynamic Standing - Level of Assistance 5: Stand by assistance;Other (comment)  min guard   Dynamic Standing - Balance Activities Forward lean/weight shifting   Dynamic Standing - Comments ant/post weight shifting with occasional UE support on chair 2x20. VC's and demonstration for technique.                 PT Education - 06/08/14 1247    Education provided Yes   Education Details Balance HEP   Person(s) Educated Patient   Methods Explanation;Demonstration;Verbal cues;Handout   Comprehension Verbalized understanding;Need further instruction;Returned demonstration          PT Short Term Goals - 05/18/14 1307    PT SHORT TERM  GOAL #1   Title Same as LTGs.           PT Long Term Goals - 06/08/14 1252    PT LONG TERM GOAL #1   Title Pt will be independent in HEP to improve functional mobility. Target date: 06/15/14.   Baseline Goals that are not met will be carried over to new POC 07/17/14.   Status On-going   PT LONG TERM GOAL #2   Title Pt will verbalize understanding of fall prevention strategies to decrease falls risk. Target date: 06/15/14.   Status On-going   PT LONG TERM GOAL #3   Title Pt will improve BERG score to >/=49/56 to reduce falls risk. Target date: 06/15/14.   Status On-going   PT LONG TERM GOAL #4   Title Pt will ambulate 600' over even/uneven terrain with LRAD at MOD I level to improve functional mobility. Target date: 06/15/14.   Status On-going   PT LONG TERM GOAL #5   Title Pt will report no falls in the last 4 weeks to improve safety during functional mobility. Target date: 06/15/14.   Status On-going               Plan - 06/08/14 1248    Clinical Impression Statement Pt's BP improved today and pt was able to participate in PT without any symptoms of dizziness. Pt required min guard for safety during ambulation without rollator, but demonstrated improved balance with rollator and progressed to supervision. Pt's behavior presented differently today, affect was mostly flat (per usual) but she would occasionally laugh/smile/talk to herself. Pt's gait was also ataxic in nature today and her balance appeared to be more impaired. Pt reported she took medications as prescribed by MD today and denied changes. Continue with POC. PT will send recert to MD today, due to missed appointments.   Pt will benefit from skilled therapeutic intervention in order to improve on the following deficits Abnormal gait;Decreased endurance;Decreased strength;Decreased mobility;Decreased knowledge of use of DME;Decreased balance;Pain;Impaired sensation   Rehab Potential Good   PT Frequency 2x / week   PT  Duration 4 weeks   PT Treatment/Interventions ADLs/Self Care Home Management;Gait training;Neuromuscular re-education;Stair training;Biofeedback;Functional mobility training;Patient/family education;Therapeutic activities;Cryotherapy;Therapeutic exercise;Manual techniques;Balance training;Moist Heat;DME Instruction;Electrical Stimulation   PT Next Visit Plan Assess goals and revise if necessary. Provide strengthening HEP.   Consulted and Agree with Plan of Care Patient        Problem List Patient Active Problem List   Diagnosis Date Noted  . Orthostatic hypotension 04/20/2014  . Hyperlipidemia 04/20/2014  . Low back pain radiating to right leg 03/01/2013  . Numbness in right leg 03/01/2013  . Chest pain 03/01/2013  . Hypertension   . Back pain   . Stroke     Ann Groeneveld L 06/08/2014, 12:55  PM  [facility] [address] [place], [place], [address] Phone: [phone]   Fax:  [phone]     Geoffry Paradise, PT,DPT 06/08/2014 12:55 PM Phone: [phone] Fax: [phone]

## 2014-06-08 NOTE — Patient Instructions (Signed)
Perform all balance exercises in a corner with a chair in front of you for safety OR perform at kitchen sink with a chair behind you for safety:  Weight Shift: Anterior / Posterior (Limits of Stability)   Slowly shift weight backward until toes begin to rise off floor. Return to starting position. Shift weight slowly forward until heels begin to rise off floor. Hold each position __2__ seconds. Repeat _20___ times per session. Do __1__ sessions per day.      Copyright  VHI. All rights reserved.  Feet Heel-Toe "Tandem", Varied Arm Positions - Eyes Open   With eyes open, right foot directly in front of the other, arms at your side, look straight ahead at a stationary object. Hold __30__ seconds. Repeat with other foot in front. Repeat __3__ times per session. Do __1__ sessions per day.  Copyright  VHI. All rights reserved.  Single Leg - Eyes Open   Holding support, lift right leg while maintaining balance over other leg. Progress to removing hands from support surface for longer periods of time. Repeat with other leg lifted. Hold_10-30___ seconds. Repeat __3__ times per session. Do __1__ sessions per day.  Copyright  VHI. All rights reserved.  Feet Apart, Varied Arm Positions - Eyes Closed   Stand with feet shoulder width apart and arms at your side. Close eyes and visualize upright position. Hold __30__ seconds. Repeat ____ times per session. Do ____ sessions per day.  Copyright  VHI. All rights reserved.     Copyright  VHI. All rights reserved.  Feet Together, Head Motion - Eyes Open   With eyes open, feet together, move head slowly: up and down and side to side for 30 seconds. Repeat __3__ times per session. Do __1__ sessions per day.  Copyright  VHI. All rights reserved.

## 2014-06-10 ENCOUNTER — Ambulatory Visit: Payer: Self-pay

## 2014-06-15 ENCOUNTER — Ambulatory Visit: Payer: Self-pay | Admitting: Physical Therapy

## 2014-06-16 ENCOUNTER — Encounter: Payer: Self-pay | Admitting: Internal Medicine

## 2014-06-16 ENCOUNTER — Ambulatory Visit: Payer: Self-pay | Attending: Internal Medicine | Admitting: Internal Medicine

## 2014-06-16 VITALS — BP 146/90 | HR 98 | Temp 98.1°F | Resp 16 | Wt 263.6 lb

## 2014-06-16 DIAGNOSIS — Z79899 Other long term (current) drug therapy: Secondary | ICD-10-CM | POA: Insufficient documentation

## 2014-06-16 DIAGNOSIS — K219 Gastro-esophageal reflux disease without esophagitis: Secondary | ICD-10-CM

## 2014-06-16 DIAGNOSIS — I4901 Ventricular fibrillation: Secondary | ICD-10-CM

## 2014-06-16 DIAGNOSIS — M2550 Pain in unspecified joint: Secondary | ICD-10-CM

## 2014-06-16 DIAGNOSIS — R209 Unspecified disturbances of skin sensation: Secondary | ICD-10-CM | POA: Insufficient documentation

## 2014-06-16 DIAGNOSIS — I69398 Other sequelae of cerebral infarction: Secondary | ICD-10-CM | POA: Insufficient documentation

## 2014-06-16 DIAGNOSIS — Z87891 Personal history of nicotine dependence: Secondary | ICD-10-CM | POA: Insufficient documentation

## 2014-06-16 DIAGNOSIS — M545 Low back pain: Secondary | ICD-10-CM

## 2014-06-16 DIAGNOSIS — G8929 Other chronic pain: Secondary | ICD-10-CM

## 2014-06-16 DIAGNOSIS — I10 Essential (primary) hypertension: Secondary | ICD-10-CM

## 2014-06-16 DIAGNOSIS — Z7982 Long term (current) use of aspirin: Secondary | ICD-10-CM | POA: Insufficient documentation

## 2014-06-16 DIAGNOSIS — I498 Other specified cardiac arrhythmias: Secondary | ICD-10-CM | POA: Insufficient documentation

## 2014-06-16 LAB — URIC ACID: Uric Acid, Serum: 6.2 mg/dL (ref 2.4–7.0)

## 2014-06-16 MED ORDER — OMEPRAZOLE 20 MG PO CPDR: 20.0000 mg | DELAYED_RELEASE_CAPSULE | Freq: Every day | ORAL | Status: DC

## 2014-06-16 MED ORDER — ATENOLOL 25 MG PO TABS: 25.0000 mg | ORAL_TABLET | Freq: Every day | ORAL | Status: DC

## 2014-06-16 MED ORDER — GABAPENTIN 100 MG PO CAPS: 100.0000 mg | ORAL_CAPSULE | Freq: Three times a day (TID) | ORAL | Status: DC

## 2014-06-16 NOTE — Progress Notes (Signed)
MRN: [id number] Name: Lorraine Ryan  Sex: female Age: 59 y.o. DOB: 08-14-1955  Allergies: Codeine; Tramadol; Oxycodone; and Prednisone  Chief Complaint  Patient presents with  . Hand Pain    HPI: Patient is 59 y.o. female who has history of hypertension, palpitation, chronic lower back pain gait problem comes today for followup requesting refill on her medications also complaining of multiple joint pain especially hand joints with stiffness, she denies any family history of rheumatoid arthritis, she follows up with physical therapy for lower back pain.  Past Medical History  Diagnosis Date  . Hypertension     No meds prescribed; states intermittent  . Back pain   . Stroke     caused numbness in leg    Past Surgical History  Procedure Laterality Date  . Ganglion cyst excision    . Abdominal hysterectomy    . Tonsillectomy        Medication List       This list is accurate as of: 06/16/14 11:39 AM.  Always use your most recent med list.               ABILIFY PO  Take 1 tablet by mouth daily.     aspirin EC 81 MG tablet  Take 1 tablet (81 mg total) by mouth daily.     atenolol 25 MG tablet  Commonly known as:  TENORMIN  Take 1 tablet (25 mg total) by mouth daily.     BC HEADACHE PO  Take 1 packet by mouth every 6 (six) hours as needed (for headache or pain).     buPROPion 150 MG 24 hr tablet  Commonly known as:  WELLBUTRIN XL  Take 150 mg by mouth daily.     colchicine 0.6 MG tablet  Take 1 tablet (0.6 mg total) by mouth 2 (two) times daily as needed.     gabapentin 100 MG capsule  Commonly known as:  NEURONTIN  Take 1 capsule (100 mg total) by mouth 3 (three) times daily.     omeprazole 20 MG capsule  Commonly known as:  PRILOSEC  Take 1 capsule (20 mg total) by mouth daily.     ondansetron 8 MG tablet  Commonly known as:  ZOFRAN  Take 1 tablet (8 mg total) by mouth every 8 (eight) hours as needed for nausea or vomiting.     oxyCODONE-acetaminophen 5-325 MG per tablet  Commonly known as:  PERCOCET  Take 1 tablet by mouth every 6 (six) hours as needed for moderate pain.     traZODone 100 MG tablet  Commonly known as:  DESYREL  Take 100 mg by mouth at bedtime.     Vitamin D (Ergocalciferol) 50000 UNITS Caps capsule  Commonly known as:  DRISDOL  Take 1 capsule (50,000 Units total) by mouth every 7 (seven) days.        Meds ordered this encounter  Medications  . atenolol (TENORMIN) 25 MG tablet    Sig: Take 1 tablet (25 mg total) by mouth daily.    Dispense:  30 tablet    Refill:  3  . omeprazole (PRILOSEC) 20 MG capsule    Sig: Take 1 capsule (20 mg total) by mouth daily.    Dispense:  30 capsule    Refill:  3  . gabapentin (NEURONTIN) 100 MG capsule    Sig: Take 1 capsule (100 mg total) by mouth 3 (three) times daily.    Dispense:  90 capsule    Refill:  3    Immunization History  Administered Date(s) Administered  . Influenza,trivalent, recombinat, inj, PF 01/13/2013  . PPD Test 06/02/2013  . Rabies, IM 11/28/2012, 12/01/2012, 12/05/2012, 12/12/2012  . Tdap 11/28/2012    Family History  Problem Relation Age of Onset  . Heart disease      No family history  . Diabetes Mother   . Hypertension Mother     History  Substance Use Topics  . Smoking status: Former Research scientist (life sciences)  . Smokeless tobacco: Never Used  . Alcohol Use: No    Review of Systems   As noted in HPI  Filed Vitals:   06/16/14 1101  BP: 146/90  Pulse: 98  Temp: 98.1 F (36.7 C)  Resp: 16    Physical Exam  Physical Exam  Eyes: EOM are normal. Pupils are equal, round, and reactive to light.  Cardiovascular: Normal rate and regular rhythm.   Pulmonary/Chest: Breath sounds normal. No respiratory distress. She has no wheezes. She has no rales.  Musculoskeletal:  Minimal lower lumbar paraspinal tenderness  Minimal swelling on MCP, PIP joints but no tenderness    CBC    Component Value Date/Time   WBC 5.7  03/07/2014 1005   RBC 4.59 03/07/2014 1005   HGB 14.0 03/07/2014 1005   HCT 41.4 03/07/2014 1005   PLT 256 03/07/2014 1005   MCV 90.2 03/07/2014 1005   LYMPHSABS 1.9 03/07/2014 1005   MONOABS 0.4 03/07/2014 1005   EOSABS 0.1 03/07/2014 1005   BASOSABS 0.1 03/07/2014 1005    CMP     Component Value Date/Time   NA 141 03/07/2014 1005   K 4.2 03/07/2014 1005   CL 106 03/07/2014 1005   CO2 24 03/07/2014 1005   GLUCOSE 76 03/07/2014 1005   BUN 10 03/07/2014 1005   CREATININE 0.66 03/07/2014 1005   CREATININE 0.61 03/06/2013 0904   CALCIUM 9.2 03/07/2014 1005   PROT 6.8 03/07/2014 1005   ALBUMIN 4.0 03/07/2014 1005   AST 19 03/07/2014 1005   ALT 24 03/07/2014 1005   ALKPHOS 92 03/07/2014 1005   BILITOT 0.9 03/07/2014 1005   GFRNONAA >89 03/07/2014 1005   GFRNONAA >90 03/06/2013 0904   GFRAA >89 03/07/2014 1005   GFRAA >90 03/06/2013 0904    Lab Results  Component Value Date/Time   CHOL 250* 03/07/2014 10:05 AM    Lab Results  Component Value Date/Time   HGBA1C 5.70 03/07/2014 09:39 AM    Lab Results  Component Value Date/Time   AST 19 03/07/2014 10:05 AM    Assessment and Plan  Fluttering heart /Essential hypertension- Plan: advised patient for DASH diet, continue with atenolol (TENORMIN) 25 MG tablet   Gastroesophageal reflux disease, esophagitis presence not specified - Plan:lifestyle modification continue with omeprazole (PRILOSEC) 20 MG capsule  Chronic low back pain - Plan: gabapentin (NEURONTIN) 100 MG capsule, continue with physical therapy  Multiple joint pain - Plan: ANA, Uric acid, Cyclic citrul peptide antibody, IgG  Return in about 3 months (around 09/16/2014), or if symptoms worsen or fail to improve.   This note has been created with Surveyor, quantity. Any transcriptional errors are unintentional.    [name], MD

## 2014-06-16 NOTE — Progress Notes (Signed)
Patient complains of bilateral hand swelling that started about a month ago Patient feels it may be her arthritis but she is not sure Patient also requesting a refill on her medications

## 2014-06-17 ENCOUNTER — Ambulatory Visit: Payer: Self-pay

## 2014-06-17 LAB — CYCLIC CITRUL PEPTIDE ANTIBODY, IGG: Cyclic Citrullin Peptide Ab: <2 U/mL (ref 0.0–5.0)

## 2014-06-17 LAB — ANA: Anti Nuclear Antibody(ANA): NEGATIVE

## 2014-06-22 ENCOUNTER — Telehealth: Payer: Self-pay

## 2014-06-22 ENCOUNTER — Telehealth: Payer: Self-pay | Admitting: Internal Medicine

## 2014-06-22 ENCOUNTER — Telehealth: Payer: Self-pay | Admitting: *Deleted

## 2014-06-22 ENCOUNTER — Ambulatory Visit: Payer: Self-pay | Admitting: Physical Therapy

## 2014-06-22 VITALS — BP 128/97 | HR 85

## 2014-06-22 DIAGNOSIS — R269 Unspecified abnormalities of gait and mobility: Secondary | ICD-10-CM

## 2014-06-22 NOTE — Telephone Encounter (Signed)
Lorraine Ryan from [facility] called to have PCP aware that the patients blood pressure runs high when doing activities, the patient's current blood pressure is 147/109 when standing. Please f/u

## 2014-06-22 NOTE — Telephone Encounter (Signed)
Patient is returning phone call, please f/u °

## 2014-06-22 NOTE — Telephone Encounter (Signed)
Patient not available Message left on machine to return our call

## 2014-06-22 NOTE — Therapy (Signed)
[facility] [address] [place], [place], [address] Phone: [phone]   Fax:  [phone]  Physical Therapy Treatment  Patient Details  Name: Lorraine Ryan MRN: [id number] Date of Birth: 1955-04-23 Referring Provider:  [name], MD  Encounter Date: 06/22/2014      PT End of Session - 06/22/14 1024    Visit Number 4   Number of Visits 9   Date for PT Re-Evaluation 06/17/14   Authorization Type No insurance-[facility] assistance   PT Start Time 0934   PT Stop Time 1017   PT Time Calculation (min) 43 min   Activity Tolerance Patient tolerated treatment well   Behavior During Therapy Flat affect  occaasionally talking to self or appears to see someone in front of her and talks to them      Past Medical History  Diagnosis Date  . Hypertension     No meds prescribed; states intermittent  . Back pain   . Stroke     caused numbness in leg    Past Surgical History  Procedure Laterality Date  . Ganglion cyst excision    . Abdominal hysterectomy    . Tonsillectomy      Filed Vitals:   06/22/14 0940 06/22/14 0956 06/22/14 1011  BP: 130/94 147/109 128/97  Pulse: 84 85 85    Visit Diagnosis:  Abnormality of gait      Subjective Assessment - 06/22/14 0940    Subjective Pt states she feels like R knee buckles, no falls.   Pertinent History CVA, HTN, orthostatic hypotension, chronic back pain   Patient Stated Goals Walk without stumbling   Currently in Pain? Yes   Pain Score 8    Pain Location Hip   Pain Orientation Right   Pain Descriptors / Indicators Aching   Pain Type Chronic pain   Pain Onset More than a month ago   Pain Frequency Constant   Pain Score 10   Pain Location Back   Pain Orientation Right;Left;Lower   Pain Descriptors / Indicators Aching   Pain Type Chronic pain   Pain Onset More than a month ago   Pain Relieving Factors rest and sitting                          [facility] Adult PT Treatment/Exercise - 06/22/14 0950    Berg Balance Test   Sit to Stand Able to stand without using hands and stabilize independently   Standing Unsupported Able to stand 2 minutes with supervision   Sitting with Back Unsupported but Feet Supported on Floor or Stool Able to sit safely and securely 2 minutes   Stand to Sit Sits safely with minimal use of hands   Transfers Able to transfer safely, minor use of hands   Standing Unsupported with Eyes Closed Able to stand 10 seconds with supervision   Standing Ubsupported with Feet Together Able to place feet together independently and stand for 1 minute with supervision   From Standing, Reach Forward with Outstretched Arm Can reach forward >12 cm safely (5")   From Standing Position, Pick up Object from Floor Able to pick up shoe, needs supervision   From Standing Position, Turn to Look Behind Over each Shoulder Looks behind from both sides and weight shifts well   Turn 360 Degrees Able to turn 360 degrees safely but slowly   Standing Unsupported, Alternately Place Feet on Step/Stool Able to complete >2 steps/needs minimal assist  Standing Unsupported, One Foot in [place] to take small step independently and hold 30 seconds   Standing on One Leg Tries to lift leg/unable to hold 3 seconds but remains standing independently   Total Score 41                PT Education - 06/22/14 1023    Education provided Yes   Education Details Call MD if presents with headache, numbness, vision changes, etc..., monitoring salt intake, taking meds consistently   Person(s) Educated Patient   Methods Explanation;Verbal cues   Comprehension Verbalized understanding          PT Short Term Goals - 05/18/14 1307    PT SHORT TERM GOAL #1   Title Same as LTGs.           PT Long Term Goals - 06/22/14 1031    PT LONG TERM GOAL #1   Title Pt will be independent in HEP to improve functional mobility. Target date: 06/15/14.    Baseline Goals that are not met will be carried over to new POC 07/17/14.   Status On-going   PT LONG TERM GOAL #2   Title Pt will verbalize understanding of fall prevention strategies to decrease falls risk. Target date: 06/15/14.   Baseline Goals that are not met will be carried over to new POC 07/17/14.   Status On-going   PT LONG TERM GOAL #3   Title Pt will improve BERG score to >/=49/56 to reduce falls risk. Target date: 06/15/14.   Baseline Goals that are not met will be carried over to new POC 07/17/14.   Status On-going   PT LONG TERM GOAL #4   Title Pt will ambulate 600' over even/uneven terrain with LRAD at MOD I level to improve functional mobility. Target date: 06/15/14.   Baseline Goals that are not met will be carried over to new POC 07/17/14.   Status On-going   PT LONG TERM GOAL #5   Title Pt will report no falls in the last 4 weeks to improve safety during functional mobility. Target date: 06/15/14.   Baseline Goals that are not met will be carried over to new POC 07/17/14.   Status On-going               Plan - 06/22/14 1025    Clinical Impression Statement Pt continues with inconsistency in BP.  Unsure if pt taking meds accurately although she states she is.  Continues with flat affect and talking to herself/others that she "sees".  Pt score decreased on BERG from eval.  Placed call to Dr Advani''s office and left message with receptionist and on 2 nurse lines concerning increased BP today.  Geoffry Paradise, PT, also aware of BP issues and phone call to MD.  Pt did not meet initial LTG's therefore recertification completed by Geoffry Paradise, PT.   Pt will benefit from skilled therapeutic intervention in order to improve on the following deficits Abnormal gait;Decreased endurance;Decreased strength;Decreased mobility;Decreased knowledge of use of DME;Decreased balance;Pain;Impaired sensation   Rehab Potential Good   PT Frequency 2x / week   PT Duration 4 weeks   PT  Treatment/Interventions ADLs/Self Care Home Management;Gait training;Neuromuscular re-education;Stair training;Biofeedback;Functional mobility training;Patient/family education;Therapeutic activities;Cryotherapy;Therapeutic exercise;Manual techniques;Balance training;Moist Heat;DME Instruction;Electrical Stimulation   PT Next Visit Plan Provide strengthening HEP if BP tolerates.  Assess for assistive device for home use.   Consulted and Agree with Plan of Care Patient        Problem List Patient  Active Problem List   Diagnosis Date Noted  . Orthostatic hypotension 04/20/2014  . Hyperlipidemia 04/20/2014  . Low back pain radiating to right leg 03/01/2013  . Numbness in right leg 03/01/2013  . Chest pain 03/01/2013  . Hypertension   . Back pain   . Stroke     Narda Bonds 06/22/2014, 10:34 AM  [facility] [address] [place], [place], [address] Phone: [phone]   Fax:  [place], [place] 06/22/2014 10:34 AM Phone: [phone] Fax: [phone]

## 2014-06-22 NOTE — Telephone Encounter (Signed)
-----   Message from [name], MD sent at 06/20/2014  9:28 AM EDT ----- Call and let the Patient know that blood work is normal.

## 2014-06-22 NOTE — Telephone Encounter (Signed)
Neuro rehab RN called in to say patient was in therapy today and had blood pressure of 147/109 and she wanted to know what Dr. Annitta Needs wanted the patient to do.

## 2014-07-05 ENCOUNTER — Encounter: Payer: Self-pay | Admitting: Internal Medicine

## 2014-07-05 ENCOUNTER — Ambulatory Visit: Payer: Self-pay | Attending: Internal Medicine | Admitting: Internal Medicine

## 2014-07-05 VITALS — BP 137/88 | HR 80 | Temp 98.7°F | Resp 16 | Wt 264.4 lb

## 2014-07-05 DIAGNOSIS — Z8673 Personal history of transient ischemic attack (TIA), and cerebral infarction without residual deficits: Secondary | ICD-10-CM | POA: Insufficient documentation

## 2014-07-05 DIAGNOSIS — R1030 Lower abdominal pain, unspecified: Secondary | ICD-10-CM

## 2014-07-05 DIAGNOSIS — R319 Hematuria, unspecified: Secondary | ICD-10-CM | POA: Insufficient documentation

## 2014-07-05 DIAGNOSIS — I10 Essential (primary) hypertension: Secondary | ICD-10-CM | POA: Insufficient documentation

## 2014-07-05 DIAGNOSIS — Z87891 Personal history of nicotine dependence: Secondary | ICD-10-CM | POA: Insufficient documentation

## 2014-07-05 LAB — POCT URINALYSIS DIPSTICK
BILIRUBIN UA: NEGATIVE
Glucose, UA: NEGATIVE
KETONES UA: NEGATIVE
Leukocytes, UA: NEGATIVE
Nitrite, UA: NEGATIVE
PH UA: 7
Protein, UA: NEGATIVE
Spec Grav, UA: 1.025
Urobilinogen, UA: 1

## 2014-07-05 NOTE — Progress Notes (Signed)
Patient complains of having stabbing pain in her lower abd that started about six days ago Patient states the pain comes and goes Patient denies any problems with voiding

## 2014-07-05 NOTE — Progress Notes (Signed)
MRN: [id number] Name: Lorraine Ryan  Sex: female Age: 59 y.o. DOB: 08/10/1955  Allergies: Codeine; Tramadol; Oxycodone; and Prednisone  Chief Complaint  Patient presents with  . Abdominal Pain    HPI: Patient is 59 y.o. female who comes today complaining of lower abdominal pain which started one week ago it has been sharp/crampy on and off, last for a few minutes and resolves, she had her similar pain yesterday 2 times denies any currently, she denies any urinary symptoms, denies any change in bowel habits denies any nausea vomiting, denies any vaginal discharge.  Past Medical History  Diagnosis Date  . Hypertension     No meds prescribed; states intermittent  . Back pain   . Stroke     caused numbness in leg    Past Surgical History  Procedure Laterality Date  . Ganglion cyst excision    . Abdominal hysterectomy    . Tonsillectomy        Medication List       This list is accurate as of: 07/05/14  1:31 PM.  Always use your most recent med list.               ABILIFY PO  Take 1 tablet by mouth daily.     aspirin EC 81 MG tablet  Take 1 tablet (81 mg total) by mouth daily.     atenolol 25 MG tablet  Commonly known as:  TENORMIN  Take 1 tablet (25 mg total) by mouth daily.     BC HEADACHE PO  Take 1 packet by mouth every 6 (six) hours as needed (for headache or pain).     buPROPion 150 MG 24 hr tablet  Commonly known as:  WELLBUTRIN XL  Take 150 mg by mouth daily.     colchicine 0.6 MG tablet  Take 1 tablet (0.6 mg total) by mouth 2 (two) times daily as needed.     gabapentin 100 MG capsule  Commonly known as:  NEURONTIN  Take 1 capsule (100 mg total) by mouth 3 (three) times daily.     omeprazole 20 MG capsule  Commonly known as:  PRILOSEC  Take 1 capsule (20 mg total) by mouth daily.     ondansetron 8 MG tablet  Commonly known as:  ZOFRAN  Take 1 tablet (8 mg total) by mouth every 8 (eight) hours as needed for nausea or vomiting.     oxyCODONE-acetaminophen 5-325 MG per tablet  Commonly known as:  PERCOCET  Take 1 tablet by mouth every 6 (six) hours as needed for moderate pain.     traZODone 100 MG tablet  Commonly known as:  DESYREL  Take 100 mg by mouth at bedtime.     Vitamin D (Ergocalciferol) 50000 UNITS Caps capsule  Commonly known as:  DRISDOL  Take 1 capsule (50,000 Units total) by mouth every 7 (seven) days.        No orders of the defined types were placed in this encounter.    Immunization History  Administered Date(s) Administered  . Influenza,trivalent, recombinat, inj, PF 01/13/2013  . PPD Test 06/02/2013  . Rabies, IM 11/28/2012, 12/01/2012, 12/05/2012, 12/12/2012  . Tdap 11/28/2012    Family History  Problem Relation Age of Onset  . Heart disease      No family history  . Diabetes Mother   . Hypertension Mother     History  Substance Use Topics  . Smoking status: Former Research scientist (life sciences)  . Smokeless tobacco: Never Used  .  Alcohol Use: No    Review of Systems   As noted in HPI  Filed Vitals:   07/05/14 1030  BP: 137/88  Pulse: 80  Temp: 98.7 F (37.1 C)  Resp: 16    Physical Exam  Physical Exam  Eyes: EOM are normal. Pupils are equal, round, and reactive to light.  Cardiovascular: Normal rate and regular rhythm.   Pulmonary/Chest: Breath sounds normal. No respiratory distress. She has no wheezes. She has no rales.  Abdominal:  Patient has some tenderness with deep palpation in the right flank area, no rebound or guarding, bowel sounds positive    CBC    Component Value Date/Time   WBC 5.7 03/07/2014 1005   RBC 4.59 03/07/2014 1005   HGB 14.0 03/07/2014 1005   HCT 41.4 03/07/2014 1005   PLT 256 03/07/2014 1005   MCV 90.2 03/07/2014 1005   LYMPHSABS 1.9 03/07/2014 1005   MONOABS 0.4 03/07/2014 1005   EOSABS 0.1 03/07/2014 1005   BASOSABS 0.1 03/07/2014 1005    CMP     Component Value Date/Time   NA 141 03/07/2014 1005   K 4.2 03/07/2014 1005   CL 106  03/07/2014 1005   CO2 24 03/07/2014 1005   GLUCOSE 76 03/07/2014 1005   BUN 10 03/07/2014 1005   CREATININE 0.66 03/07/2014 1005   CREATININE 0.61 03/06/2013 0904   CALCIUM 9.2 03/07/2014 1005   PROT 6.8 03/07/2014 1005   ALBUMIN 4.0 03/07/2014 1005   AST 19 03/07/2014 1005   ALT 24 03/07/2014 1005   ALKPHOS 92 03/07/2014 1005   BILITOT 0.9 03/07/2014 1005   GFRNONAA >89 03/07/2014 1005   GFRNONAA >90 03/06/2013 0904   GFRAA >89 03/07/2014 1005   GFRAA >90 03/06/2013 0904    Lab Results  Component Value Date/Time   CHOL 250* 03/07/2014 10:05 AM    Lab Results  Component Value Date/Time   HGBA1C 5.70 03/07/2014 09:39 AM    Lab Results  Component Value Date/Time   AST 19 03/07/2014 10:05 AM    Assessment and Plan  Lower abdominal pain - Plan:  Results for orders placed or performed in visit on 07/05/14  Urinalysis Dipstick  Result Value Ref Range   Color, UA yellow    Clarity, UA clear    Glucose, UA neg    Bilirubin, UA neg    Ketones, UA neg    Spec Grav, UA 1.025    Blood, UA small    pH, UA 7.0    Protein, UA neg    Urobilinogen, UA 1.0    Nitrite, UA neg    Leukocytes, UA Negative    Urinalysis Dipstick is negative for nitrites or leukocytes, does show small blood. CT Abdomen Pelvis Wo Contrast  Hematuria - Plan: Urinalysis, Routine w reflex microscopic (not at [facility]), CT Abdomen Pelvis Wo Contrast    Return in about 3 months (around 10/05/2014), or if symptoms worsen or fail to improve.   This note has been created with Surveyor, quantity. Any transcriptional errors are unintentional.    [name], MD

## 2014-07-06 ENCOUNTER — Ambulatory Visit: Payer: Medicare Other | Attending: Internal Medicine

## 2014-07-06 ENCOUNTER — Ambulatory Visit: Payer: Self-pay | Admitting: Nurse Practitioner

## 2014-07-06 VITALS — BP 136/91 | HR 67

## 2014-07-06 DIAGNOSIS — R269 Unspecified abnormalities of gait and mobility: Secondary | ICD-10-CM

## 2014-07-06 DIAGNOSIS — R2689 Other abnormalities of gait and mobility: Secondary | ICD-10-CM | POA: Insufficient documentation

## 2014-07-06 DIAGNOSIS — R531 Weakness: Secondary | ICD-10-CM

## 2014-07-06 LAB — URINALYSIS, ROUTINE W REFLEX MICROSCOPIC
Bilirubin Urine: NEGATIVE
Glucose, UA: NEGATIVE mg/dL
Hgb urine dipstick: NEGATIVE
Ketones, ur: NEGATIVE mg/dL
LEUKOCYTES UA: NEGATIVE
Nitrite: NEGATIVE
Protein, ur: NEGATIVE mg/dL
Specific Gravity, Urine: 1.022 (ref 1.005–1.030)
Urobilinogen, UA: 1 mg/dL (ref 0.0–1.0)
pH: 6.5 (ref 5.0–8.0)

## 2014-07-06 NOTE — Patient Instructions (Signed)
KNEE: Extension, Long Arc Quad (Band)   Place band around leg and under other foot. Pull band forward until knee is straight. Hold _2__ seconds. Use __yellow______ band. _10__ reps per set, _3__ sets per day, __3-4_ days per week  Copyright  VHI. All rights reserved.   Knee Flexion: Resisted (Sitting)   Sit with band around right ankle and looped around ankle of supported leg. Pull right leg back. Use yellow band. Repeat __10__ times per set. Do __3__ sets per session. Do __3-4__ sessions per week.  http://orth.exer.us/695   Copyright  VHI. All rights reserved.   Hip Abduction / Adduction: with Knee Flexion (Supine)   With right knee bent, gently lower knee to side and return. Repeat __10__ times per set. Do __3__ sets per session. Do __3-4__ sessions per week.  http://orth.exer.us/683   Copyright  VHI. All rights reserved.    "I love a Press photographer and march each leg 10 times. Repeat __3__ times. Do __3-4__ sessions per week.  http://gt2.exer.us/345   Copyright  VHI. All rights reserved.    EXTENSION: Standing (Active)   Stand, both feet flat. Draw right leg behind body as far as possible. Hold onto counter. Complete _3__ sets of _10__ repetitions. Perform _3-4__ sessions per week.  http://gtsc.exer.us/77   Copyright  VHI. All rights reserved.   ABDUCTION: Standing (Active)   Stand, feet flat. Lift right leg out to side. Hold onto counter. Complete __3_ sets of _10__ repetitions. Perform _3-4__ sessions per week.  http://gtsc.exer.us/111   Copyright  VHI. All rights reserved.

## 2014-07-06 NOTE — Therapy (Signed)
[facility] [address] [place], [place], [address] Phone: [phone]   Fax:  [phone]  Physical Therapy Treatment  Patient Details  Name: Lorraine Ryan MRN: [id number] Date of Birth: 12/27/1955 Referring Provider:  [name], MD  Encounter Date: 07/06/2014      PT End of Session - 07/06/14 0927    Visit Number 5   Number of Visits 9   Date for PT Re-Evaluation 07/17/14   Authorization Type No insurance-[facility] assistance   PT Start Time 0846   PT Stop Time 0925   PT Time Calculation (min) 39 min   Activity Tolerance Patient tolerated treatment well   Behavior During Therapy Flat affect  pt talking to herself      Past Medical History  Diagnosis Date  . Hypertension     No meds prescribed; states intermittent  . Back pain   . Stroke     caused numbness in leg    Past Surgical History  Procedure Laterality Date  . Ganglion cyst excision    . Abdominal hysterectomy    . Tonsillectomy      Filed Vitals:   07/06/14 0847 07/06/14 0915  BP: 134/91 136/91  Pulse: 69 67    Visit Diagnosis:  Generalized weakness  Abnormality of gait      Subjective Assessment - 07/06/14 0847    Subjective "I'm not feeling great today, due to the pain." "I want a walker." Pt denied falls since yesterday. Pt reported she took her BP meds this morning.   Pertinent History CVA, HTN, orthostatic hypotension, chronic back pain   Patient Stated Goals Walk without stumbling   Currently in Pain? Yes   Pain Score 10-Worst pain ever   Pain Location Hip   Pain Orientation Right   Pain Descriptors / Indicators Aching   Pain Type Chronic pain   Pain Onset More than a month ago   Pain Frequency Constant   Aggravating Factors  worse at night   Pain Relieving Factors walking       Therex:  All exercises performed with tactile cues, demonstration, supervision, and VC's for proper technique. Cues to improve eccentric  control. Supine: B hip abduction/add. 2x10/LE. Sidelying: B clamshells x5/LE. Pt reported L shoulder N/T and R LE N/T and requested to cease exercise. Seated: R LAQs 2x10 with yellow band. R hamstring curls with yellow band 2x10. Standing: B hip flexion 2x10. B hip ext/abd 2x10. B hamstring curls x10/LE but pt had difficulty with performing without compensation.                          PT Education - 07/06/14 0926    Education provided Yes   Education Details Strengthening HEP.   Person(s) Educated Patient   Methods Explanation;Demonstration;Tactile cues;Verbal cues;Handout   Comprehension Need further instruction          PT Short Term Goals - 05/18/14 1307    PT SHORT TERM GOAL #1   Title Same as LTGs.           PT Long Term Goals - 07/06/14 8841    PT LONG TERM GOAL #1   Title Pt will be independent in HEP to improve functional mobility. Target date: 07/17/14.   Baseline Goals that are not met will be carried over to new POC 07/17/14.   Status On-going   PT LONG TERM GOAL #2   Title Pt will verbalize understanding of fall prevention  strategies to decrease falls risk. Target date: 07/17/14.   Baseline Goals that are not met will be carried over to new POC 07/17/14.   Status On-going   PT LONG TERM GOAL #3   Title Pt will improve BERG score to >/=49/56 to reduce falls risk. Target date: 07/17/14.   Baseline Goals that are not met will be carried over to new POC 07/17/14.   Status On-going   PT LONG TERM GOAL #4   Title Pt will ambulate 600' over even/uneven terrain with LRAD at MOD I level to improve functional mobility. Target date: 07/17/14.   Baseline Goals that are not met will be carried over to new POC 07/17/14.   Status On-going   PT LONG TERM GOAL #5   Title Pt will report no falls in the last 4 weeks to improve safety during functional mobility. Target date: 07/17/14.   Baseline Goals that are not met will be carried over to new POC 07/17/14.    Status On-going               Plan - 07/06/14 0927    Clinical Impression Statement Pt's BP was WNL today and pt denied lightheadedness/headache. Pt continues to require cues for therex, as she was talking to herself during session and required cues to attend to task. Continue with POC.   Pt will benefit from skilled therapeutic intervention in order to improve on the following deficits Abnormal gait;Decreased endurance;Decreased strength;Decreased mobility;Decreased knowledge of use of DME;Decreased balance;Pain;Impaired sensation   PT Frequency 2x / week   PT Duration --  8 weeks now   PT Treatment/Interventions ADLs/Self Care Home Management;Gait training;Neuromuscular re-education;Stair training;Biofeedback;Functional mobility training;Patient/family education;Therapeutic activities;Cryotherapy;Therapeutic exercise;Manual techniques;Balance training;Moist Heat;DME Instruction;Electrical Stimulation   PT Next Visit Plan Review HEP, check BP. Trial gait with rollator.   Consulted and Agree with Plan of Care Patient        Problem List Patient Active Problem List   Diagnosis Date Noted  . Orthostatic hypotension 04/20/2014  . Hyperlipidemia 04/20/2014  . Low back pain radiating to right leg 03/01/2013  . Numbness in right leg 03/01/2013  . Chest pain 03/01/2013  . Hypertension   . Back pain   . Stroke     Danisa Kopec L 07/06/2014, 9:29 AM  [facility] [address] [place], [place], [address] Phone: [phone]   Fax:  [phone]    Geoffry Paradise, PT,DPT 07/06/2014 9:29 AM Phone: [phone] Fax: [phone]

## 2014-07-12 ENCOUNTER — Ambulatory Visit (HOSPITAL_COMMUNITY)
Admission: RE | Admit: 2014-07-12 | Discharge: 2014-07-12 | Disposition: A | Discharge status: Home / Self Care | Payer: Self-pay | Source: Ambulatory Visit | Attending: Internal Medicine | Admitting: Internal Medicine

## 2014-07-12 DIAGNOSIS — M25551 Pain in right hip: Secondary | ICD-10-CM | POA: Insufficient documentation

## 2014-07-12 DIAGNOSIS — R319 Hematuria, unspecified: Secondary | ICD-10-CM | POA: Insufficient documentation

## 2014-07-12 DIAGNOSIS — M129 Arthropathy, unspecified: Secondary | ICD-10-CM | POA: Insufficient documentation

## 2014-07-12 DIAGNOSIS — K5730 Diverticulosis of large intestine without perforation or abscess without bleeding: Secondary | ICD-10-CM | POA: Insufficient documentation

## 2014-07-12 DIAGNOSIS — I517 Cardiomegaly: Secondary | ICD-10-CM | POA: Insufficient documentation

## 2014-07-12 DIAGNOSIS — R1030 Lower abdominal pain, unspecified: Secondary | ICD-10-CM | POA: Insufficient documentation

## 2014-07-13 ENCOUNTER — Telehealth: Payer: Self-pay

## 2014-07-13 ENCOUNTER — Ambulatory Visit: Payer: Medicare Other

## 2014-07-13 VITALS — BP 129/85 | HR 67

## 2014-07-13 DIAGNOSIS — R2689 Other abnormalities of gait and mobility: Secondary | ICD-10-CM | POA: Diagnosis not present

## 2014-07-13 DIAGNOSIS — R269 Unspecified abnormalities of gait and mobility: Secondary | ICD-10-CM

## 2014-07-13 DIAGNOSIS — R531 Weakness: Secondary | ICD-10-CM | POA: Diagnosis not present

## 2014-07-13 NOTE — Therapy (Signed)
[facility] [address] [place], [place], [address] Phone: [phone]   Fax:  [phone]  Physical Therapy Treatment  Patient Details  Name: Lorraine Ryan MRN: [id number] Date of Birth: 10-15-1955 Referring Provider:  [name], MD  Encounter Date: 07/13/2014      PT End of Session - 07/13/14 1637    Visit Number 6   Number of Visits 9   Date for PT Re-Evaluation 07/17/14   Authorization Type No insurance-[facility] assistance   PT Start Time 0846   PT Stop Time 0926   PT Time Calculation (min) 40 min   Equipment Utilized During Treatment Gait belt   Activity Tolerance Patient tolerated treatment well   Behavior During Therapy Flat affect      Past Medical History  Diagnosis Date  . Hypertension     No meds prescribed; states intermittent  . Back pain   . Stroke     caused numbness in leg    Past Surgical History  Procedure Laterality Date  . Ganglion cyst excision    . Abdominal hysterectomy    . Tonsillectomy      Filed Vitals:   07/13/14 0847  BP: 129/85  Pulse: 67    Visit Diagnosis:  Abnormality of gait - Plan: PT plan of care cert/re-cert  Generalized weakness - Plan: PT plan of care cert/re-cert      Subjective Assessment - 07/13/14 0847    Subjective Pt reported she stumbled when walking yesterday and today. Pt reported she took her BP meds this morning.   Pertinent History CVA, HTN, orthostatic hypotension, chronic back pain   Patient Stated Goals Walk without stumbling   Currently in Pain? Yes   Pain Score 7    Pain Location Leg   Pain Orientation Right   Pain Descriptors / Indicators Aching;Tingling   Pain Type Chronic pain   Pain Onset More than a month ago   Pain Frequency Constant                         [facility] Adult PT Treatment/Exercise - 07/13/14 0851    Transfers   Transfers Sit to Stand;Stand to Sit   Sit to Stand 5: Supervision;With upper  extremity assist;From chair/3-in-1   Sit to Stand Details (indicate cue type and reason) Cues to improve ant. weight shifting and for sequencing/locking rollator.   Stand to Sit 5: Supervision;With upper extremity assist;To chair/3-in-1   Stand to Sit Details Cues for sequencing/locking rollator.   Ambulation/Gait   Ambulation/Gait Yes   Ambulation/Gait Assistance 5: Supervision;4: Min guard   Ambulation/Gait Assistance Details Cues for sequencing with rollator. Pt performed head turns. Pt required four seated rest breaks due to fatigue and R LE pain during amb.   Ambulation Distance (Feet) --  500', 100'x2, 230', 50'x2, 75', 117'   Assistive device None;4-wheeled walker   Gait Pattern Step-through pattern;Decreased stride length;Decreased dorsiflexion - right;Decreased dorsiflexion - left;Decreased weight shift to right;Antalgic;Ataxic   Ambulation Surface Level;Unlevel;Indoor;Outdoor;Paved   Gait velocity --  1.74f/sec without AD and 2.127fsec with rollator   Ramp 5: Supervision   Ramp Details (indicate cue type and reason) VC's for weight shifting and safety while ascending/ramp with rollator.                PT Education - 07/13/14 0929    Education provided Yes   Education Details Educated pt on safety of using rollator vs. no AD. Informed pt of  the process to obtain a rollator.   Person(s) Educated Patient   Methods Explanation   Comprehension Verbalized understanding          PT Short Term Goals - 05/18/14 1307    PT SHORT TERM GOAL #1   Title Same as LTGs.           PT Long Term Goals - 07/13/14 1643    PT LONG TERM GOAL #1   Title Pt will be independent in HEP to improve functional mobility. Target date: 07/17/14.   Baseline Goals that are not met will be carried over to new POC 08/15/14.   Status On-going   PT LONG TERM GOAL #2   Title Pt will verbalize understanding of fall prevention strategies to decrease falls risk. Target date: 07/17/14.   Status  On-going   PT LONG TERM GOAL #3   Title Pt will improve BERG score to >/=49/56 to reduce falls risk. Target date: 07/17/14.   Status On-going   PT LONG TERM GOAL #4   Title Pt will ambulate 600' over even/uneven terrain with LRAD at MOD I level to improve functional mobility. Target date: 07/17/14.   Status On-going   PT LONG TERM GOAL #5   Title Pt will report no falls in the last 4 weeks to improve safety during functional mobility. Target date: 07/17/14.   Status On-going               Plan - 07/13/14 1637    Clinical Impression Statement Pt demonstrated improved safety and balance with rollator today. Pt's BP was WNL. Pt would continue to benefit from 2 more visit of PT in order to gait train with rollator, PT will send recert to MD and for rollator prescription. Continue with POC.    Pt will benefit from skilled therapeutic intervention in order to improve on the following deficits Abnormal gait;Decreased endurance;Decreased strength;Decreased mobility;Decreased knowledge of use of DME;Decreased balance;Pain;Impaired sensation   Rehab Potential Good   PT Frequency 2x / week   PT Duration --  8 weeks   PT Treatment/Interventions ADLs/Self Care Home Management;Gait training;Neuromuscular re-education;Stair training;Biofeedback;Functional mobility training;Patient/family education;Therapeutic activities;Cryotherapy;Therapeutic exercise;Manual techniques;Balance training;Moist Heat;DME Instruction;Electrical Stimulation   PT Next Visit Plan Gait train with rollator and then d/c next week.   Consulted and Agree with Plan of Care Patient        Problem List Patient Active Problem List   Diagnosis Date Noted  . Orthostatic hypotension 04/20/2014  . Hyperlipidemia 04/20/2014  . Low back pain radiating to right leg 03/01/2013  . Numbness in right leg 03/01/2013  . Chest pain 03/01/2013  . Hypertension   . Back pain   . Stroke     Kaidan Harpster L 07/13/2014, 4:44 PM  [facility] [address] [place], [place], [address] Phone: [phone]   Fax:  [phone]     Geoffry Paradise, PT,DPT 07/13/2014 4:44 PM Phone: [phone] Fax: [phone]

## 2014-07-13 NOTE — Telephone Encounter (Signed)
Patient is aware of her ct scan results

## 2014-07-13 NOTE — Telephone Encounter (Signed)
-----   Message from Lorraine Marek, MD sent at 07/12/2014  5:10 PM EDT ----- Call and let the patient know that her CT abdomen reported no kidney stones or any inflammatory process patient does have some lumbar arthropathy.

## 2014-07-20 ENCOUNTER — Ambulatory Visit: Payer: Medicare Other

## 2014-07-20 VITALS — BP 138/91 | HR 85

## 2014-07-20 DIAGNOSIS — R531 Weakness: Secondary | ICD-10-CM | POA: Diagnosis not present

## 2014-07-20 DIAGNOSIS — R269 Unspecified abnormalities of gait and mobility: Secondary | ICD-10-CM

## 2014-07-20 DIAGNOSIS — R2689 Other abnormalities of gait and mobility: Secondary | ICD-10-CM | POA: Diagnosis not present

## 2014-07-20 NOTE — Patient Instructions (Signed)
Rollator (4-wheeled walker), with a seat and basket.  [facility] or local durable medical supply store. Local church communities: call and ask them if they have donated rollators.

## 2014-07-20 NOTE — Therapy (Addendum)
[facility] [address] [place], [place], [address] Phone: [phone]   Fax:  [phone]  Physical Therapy Treatment  Patient Details  Name: Lorraine Ryan MRN: [id number] Date of Birth: [birth year] Referring Provider:  [name], MD  Encounter Date: 07/20/2014      PT End of Session - 07/20/14 1350    Visit Number 8   Number of Visits 9   Date for PT Re-Evaluation 08/15/14   Authorization Type No insurance-[facility] assistance   PT Start Time 0931   PT Stop Time 1011   PT Time Calculation (min) 40 min   Equipment Utilized During Treatment Gait belt   Activity Tolerance Patient tolerated treatment well   Behavior During Therapy [facility] for tasks assessed/performed      Past Medical History  Diagnosis Date  . Hypertension     No meds prescribed; states intermittent  . Back pain   . Stroke     caused numbness in leg    Past Surgical History  Procedure Laterality Date  . Ganglion cyst excision    . Abdominal hysterectomy    . Tonsillectomy      Filed Vitals:   07/20/14 0939  BP: 138/91  Pulse: 85    Visit Diagnosis:  Abnormality of gait      Subjective Assessment - 07/20/14 0939    Subjective Pt denied falls or changes since last visit.    Pertinent History CVA, HTN, orthostatic hypotension, chronic back pain   Patient Stated Goals Walk without stumbling   Currently in Pain? Yes   Pain Score 7    Pain Location Leg   Pain Orientation Right   Pain Descriptors / Indicators Aching;Tingling   Pain Type Chronic pain   Pain Onset More than a month ago   Pain Frequency Constant   Aggravating Factors  lifting objects   Pain Relieving Factors sitting                         [facility] Adult PT Treatment/Exercise - 07/20/14 0942    Ambulation/Gait   Ambulation/Gait Yes   Ambulation/Gait Assistance 5: Supervision;6: Modified independent (Device/Increase time)   Ambulation/Gait  Assistance Details Cues to stay within rollator while ambulating in grass. Supervision in grass due to 1 LOB episode which pt self corrected. MOD I over all other surfaces.   Ambulation Distance (Feet) 700 Feet  outdoors, 100' indoors all with rollator and 56' without AD   Assistive device Rollator;None   Gait Pattern Step-through pattern;Decreased stride length;Decreased dorsiflexion - right;Decreased dorsiflexion - left;Decreased weight shift to right;Antalgic   Ambulation Surface Level;Unlevel;Indoor;Outdoor;Paved;Grass   Standardized Balance Assessment   Standardized Balance Assessment Berg Balance Test   Berg Balance Test   Sit to Stand Able to stand without using hands and stabilize independently   Standing Unsupported Able to stand safely 2 minutes   Sitting with Back Unsupported but Feet Supported on Floor or Stool Able to sit safely and securely 2 minutes   Stand to Sit Sits safely with minimal use of hands   Transfers Able to transfer safely, minor use of hands   Standing Unsupported with Eyes Closed Able to stand 10 seconds safely   Standing Ubsupported with Feet Together Able to place feet together independently and stand 1 minute safely   From Standing, Reach Forward with Outstretched Arm Can reach forward >12 cm safely (5")   From Standing Position, Pick up Object from Floor Able to pick  up shoe safely and easily   From Standing Position, Turn to Look Behind Over each Shoulder Looks behind from both sides and weight shifts well   Turn 360 Degrees Able to turn 360 degrees safely in 4 seconds or less   Standing Unsupported, Alternately Place Feet on Step/Stool Able to stand independently and safely and complete 8 steps in 20 seconds   Standing Unsupported, One Foot in [place] to place foot tandem independently and hold 30 seconds   Standing on One Leg Tries to lift leg/unable to hold 3 seconds but remains standing independently   Total Score 52                PT  Education - 07/20/14 0940    Education provided Yes   Education Details PT explained process for obtaining rollator and that a prescription is only required if she has insurance. PT provided resources for pt to contact in order to obtain a rollator. PT also explained goal progress and BERG score results. PT also encouraged pt to take medications as prescribed by MD, as pt told PT (after amb.) that she did not take BP medication this morning. Pt's BP was slighted elevated after amb., but pt reported she would take meds when she got home per MD instructions.   Person(s) Educated Patient   Methods Explanation;Handout   Comprehension Verbalized understanding          PT Short Term Goals - 05/18/14 1307    PT SHORT TERM GOAL #1   Title Same as LTGs.           PT Long Term Goals - 07/20/14 1352    PT LONG TERM GOAL #1   Title Pt will be independent in HEP to improve functional mobility. Target date: 07/17/14.   Baseline Goals that are not met will be carried over to new POC 08/15/14.   Status On-going   PT LONG TERM GOAL #2   Title Pt will verbalize understanding of fall prevention strategies to decrease falls risk. Target date: 07/17/14.   Status Achieved   PT LONG TERM GOAL #3   Title Pt will improve BERG score to >/=49/56 to reduce falls risk. Target date: 07/17/14.   Status Achieved   PT LONG TERM GOAL #4   Title Pt will ambulate 600' over even/uneven terrain with LRAD at MOD I level to improve functional mobility. Target date: 07/17/14.   Status Partially Met   PT LONG TERM GOAL #5   Title Pt will report no falls in the last 4 weeks to improve safety during functional mobility. Target date: 07/17/14.   Status Achieved               Plan - 07/20/14 1351    Clinical Impression Statement Pt demonstrated progress as she met LTG2, 3, and 5. Pt partially met LTG 4, as she still required supervision during ambulation over grassy terrain 2/2 one LOB episode. Pt demonstrated good  progress, PT will assess LTG (HEP) 1 next session and d/c pt. Continue with POC.   Pt will benefit from skilled therapeutic intervention in order to improve on the following deficits Abnormal gait;Decreased endurance;Decreased strength;Decreased mobility;Decreased knowledge of use of DME;Decreased balance;Pain;Impaired sensation   Rehab Potential Good   PT Frequency 2x / week   PT Duration 8 weeks   PT Treatment/Interventions ADLs/Self Care Home Management;Gait training;Neuromuscular re-education;Stair training;Biofeedback;Functional mobility training;Patient/family education;Therapeutic activities;Cryotherapy;Therapeutic exercise;Manual techniques;Balance training;Moist Heat;DME Instruction;Electrical Stimulation   PT Next Visit Plan Assess LTG  1 (HEP) and d/c. Ask pt if she has questions regarding obtaining rollator.   Consulted and Agree with Plan of Care Patient        Problem List Patient Active Problem List   Diagnosis Date Noted  . Orthostatic hypotension 04/20/2014  . Hyperlipidemia 04/20/2014  . Low back pain radiating to right leg 03/01/2013  . Numbness in right leg 03/01/2013  . Chest pain 03/01/2013  . Hypertension   . Back pain   . Stroke     Patt Steinhardt L 07/20/2014, 1:55 PM  [facility] [address] [place], [place], [address] Phone: [phone]   Fax:  [phone]     Geoffry Paradise, PT,DPT 07/20/2014 1:55 PM Phone: [phone] Fax: [phone]

## 2014-07-22 ENCOUNTER — Ambulatory Visit: Payer: Medicare Other

## 2014-07-22 DIAGNOSIS — R531 Weakness: Secondary | ICD-10-CM

## 2014-07-22 DIAGNOSIS — R269 Unspecified abnormalities of gait and mobility: Secondary | ICD-10-CM

## 2014-07-22 DIAGNOSIS — R2689 Other abnormalities of gait and mobility: Secondary | ICD-10-CM | POA: Diagnosis not present

## 2014-07-22 NOTE — Therapy (Signed)
[facility] [address] [place], [place], [address] Phone: [phone]   Fax:  [phone]  Physical Therapy Treatment  Patient Details  Name: Lorraine Ryan MRN: [id number] Date of Birth: April 26, 1955 Referring Provider:  [name], MD  Encounter Date: 07/22/2014      PT End of Session - 07/22/14 1043    Visit Number 9   Number of Visits 9   Date for PT Re-Evaluation 08/15/14   Authorization Type No insurance-[facility] assistance   PT Start Time 1016   PT Stop Time 1039   PT Time Calculation (min) 23 min   Activity Tolerance Patient tolerated treatment well   Behavior During Therapy [facility] for tasks assessed/performed      Past Medical History  Diagnosis Date  . Hypertension     No meds prescribed; states intermittent  . Back pain   . Stroke     caused numbness in leg    Past Surgical History  Procedure Laterality Date  . Ganglion cyst excision    . Abdominal hysterectomy    . Tonsillectomy      There were no vitals filed for this visit.  Visit Diagnosis:  Generalized weakness  Abnormality of gait      Subjective Assessment - 07/22/14 1018    Subjective Pt reported she fell yesterday at a friend's house, she denied hitting head. She said she hit her back and leg and PT encouraged pt to notify MD if pain gets worse.   Pertinent History CVA, HTN, orthostatic hypotension, chronic back pain   Patient Stated Goals Walk without stumbling   Currently in Pain? Yes   Pain Score 8    Pain Location Leg   Pain Orientation Right   Pain Descriptors / Indicators Aching   Pain Type Chronic pain   Pain Onset More than a month ago   Pain Frequency Constant   Aggravating Factors  sitting   Pain Relieving Factors stand for a few minutes       Therex: Pt reviewed and performed therex in HEP (1-2 sets of 10/exercise). Cues for technique and to improve eccentric control. See pt instructions for exercise  details. Performed with supervision. Pt stated yellow band was too easy, PT provided pt with red theraband to increase resistance.  Neuro re-ed: Pt reviewed and performed balance HEP in corner with chair and supervision for safety. Cues for ant/post weight shifting. See pt instructions for exercise details.                           PT Education - 07/22/14 1032    Education provided Yes   Education Details Reviewed and performed strength/balance HEP. PT also reiterated the importance of pt obtaining a rollator to decrease falls risk during amb., pt reported she called several charities and will obtain rollator soon.   Person(s) Educated Patient   Methods Explanation   Comprehension Verbalized understanding          PT Short Term Goals - 05/18/14 1307    PT SHORT TERM GOAL #1   Title Same as LTGs.           PT Long Term Goals - 07/22/14 1044    PT LONG TERM GOAL #1   Title Pt will be independent in HEP to improve functional mobility. Target date: 07/17/14.   Baseline Goals that are not met will be carried over to new POC 08/15/14.   Status Partially Met  PT LONG TERM GOAL #2   Title Pt will verbalize understanding of fall prevention strategies to decrease falls risk. Target date: 07/17/14.   Status Achieved   PT LONG TERM GOAL #3   Title Pt will improve BERG score to >/=49/56 to reduce falls risk. Target date: 07/17/14.   Status Achieved   PT LONG TERM GOAL #4   Title Pt will ambulate 600' over even/uneven terrain with LRAD at MOD I level to improve functional mobility. Target date: 07/17/14.   Status Partially Met   PT LONG TERM GOAL #5   Title Pt will report no falls in the last 4 weeks to improve safety during functional mobility. Target date: 07/17/14.   Status Achieved               Plan - 07/22/14 1043    Clinical Impression Statement Pt discharging today, please see d/c summary for details.        Problem List Patient Active Problem  List   Diagnosis Date Noted  . Orthostatic hypotension 04/20/2014  . Hyperlipidemia 04/20/2014  . Low back pain radiating to right leg 03/01/2013  . Numbness in right leg 03/01/2013  . Chest pain 03/01/2013  . Hypertension   . Back pain   . Stroke    PHYSICAL THERAPY DISCHARGE SUMMARY  Visits from Start of Care: 9  Current functional level related to goals / functional outcomes:     PT Long Term Goals - 07/22/14 1044    PT LONG TERM GOAL #1   Title Pt will be independent in HEP to improve functional mobility. Target date: 07/17/14.   Baseline Goals that are not met will be carried over to new POC 08/15/14.   Status Partially Met   PT LONG TERM GOAL #2   Title Pt will verbalize understanding of fall prevention strategies to decrease falls risk. Target date: 07/17/14.   Status Achieved   PT LONG TERM GOAL #3   Title Pt will improve BERG score to >/=49/56 to reduce falls risk. Target date: 07/17/14.   Status Achieved   PT LONG TERM GOAL #4   Title Pt will ambulate 600' over even/uneven terrain with LRAD at MOD I level to improve functional mobility. Target date: 07/17/14.   Status Partially Met   PT LONG TERM GOAL #5   Title Pt will report no falls in the last 4 weeks to improve safety during functional mobility. Target date: 07/17/14.   Status Achieved        Remaining deficits: Decreased balance during ambulation without rollator, decreased R LE strength (however, this is improving as pt was able to progress from yellow theraband to red theraband).   Education / Equipment: HEP and instructions on how to obtain rollator  Plan: Patient agrees to discharge.  Patient goals were met. Patient is being discharged due to meeting the stated rehab goals.  ?????       Miller,Jennifer L 07/22/2014, 10:44 AM  [facility] [address] [place], [place], [address] Phone: [phone]   Fax:  [phone]     Geoffry Paradise, PT,DPT 07/22/2014 10:44 AM Phone: [phone] Fax: [phone]

## 2014-07-22 NOTE — Patient Instructions (Signed)
Perform all balance exercises in a corner with a chair in front of you for safety OR perform at kitchen sink with a chair behind you for safety:  Weight Shift: Anterior / Posterior (Limits of Stability)   Slowly shift weight backward until toes begin to rise off floor. Return to starting position. Shift weight slowly forward until heels begin to rise off floor. Hold each position __2__ seconds. Repeat _20___ times per session. Do __1__ sessions per day.      Copyright  VHI. All rights reserved.  Feet Heel-Toe "Tandem", Varied Arm Positions - Eyes Open   With eyes open, right foot directly in front of the other, arms at your side, look straight ahead at a stationary object. Hold __30__ seconds. Repeat with other foot in front. Repeat __3__ times per session. Do __1__ sessions per day.  Copyright  VHI. All rights reserved.  Single Leg - Eyes Open   Holding support, lift right leg while maintaining balance over other leg. Progress to removing hands from support surface for longer periods of time. Repeat with other leg lifted. Hold_10-30___ seconds. Repeat __3__ times per session. Do __1__ sessions per day.  Copyright  VHI. All rights reserved.  Feet Apart, Varied Arm Positions - Eyes Closed   Stand with feet shoulder width apart and arms at your side. Close eyes and visualize upright position. Hold __30__ seconds. Repeat ____ times per session. Do ____ sessions per day.  Copyright  VHI. All rights reserved.     Copyright  VHI. All rights reserved.  Feet Together, Head Motion - Eyes Open   With eyes open, feet together, move head slowly: up and down and side to side for 30 seconds. Repeat __3__ times per session. Do __1__ sessions per day.  Copyright  VHI. All rights reserved.   KNEE: Extension, Long Arc Quad (Band)   Place band around leg and under other foot. Pull band forward until knee is straight. Hold _2__ seconds. Use __yellow______ band. _10__ reps  per set, _3__ sets per day, __3-4_ days per week  Copyright  VHI. All rights reserved.   Knee Flexion: Resisted (Sitting)   Sit with band around right ankle and looped around ankle of supported leg. Pull right leg back. Use yellow band. Repeat __10__ times per set. Do __3__ sets per session. Do __3-4__ sessions per week.  http://orth.exer.us/695   Copyright  VHI. All rights reserved.   Hip Abduction / Adduction: with Knee Flexion (Supine)   With right knee bent, gently lower knee to side and return. Repeat __10__ times per set. Do __3__ sets per session. Do __3-4__ sessions per week.  http://orth.exer.us/683   Copyright  VHI. All rights reserved.    "I love a Press photographer and march each leg 10 times. Repeat __3__ times. Do __3-4__ sessions per week.  http://gt2.exer.us/345   Copyright  VHI. All rights reserved.    EXTENSION: Standing (Active)   Stand, both feet flat. Draw right leg behind body as far as possible. Hold onto counter. Complete _3__ sets of _10__ repetitions. Perform _3-4__ sessions per week.  http://gtsc.exer.us/77   Copyright  VHI. All rights reserved.   ABDUCTION: Standing (Active)   Stand, feet flat. Lift right leg out to side. Hold onto counter. Complete __3_ sets of _10__ repetitions. Perform _3-4__ sessions per week.  http://gtsc.exer.us/111   Copyright  VHI. All rights reserved.

## 2014-07-26 ENCOUNTER — Ambulatory Visit: Payer: Self-pay | Admitting: Physical Therapy

## 2014-07-28 ENCOUNTER — Ambulatory Visit: Payer: Self-pay | Admitting: Physical Therapy

## 2014-08-02 ENCOUNTER — Ambulatory Visit: Payer: Self-pay | Admitting: Physical Therapy

## 2014-08-03 ENCOUNTER — Ambulatory Visit: Payer: Self-pay

## 2014-08-09 ENCOUNTER — Ambulatory Visit: Payer: Self-pay

## 2014-08-10 ENCOUNTER — Ambulatory Visit: Payer: Self-pay

## 2014-08-10 ENCOUNTER — Ambulatory Visit: Payer: No Typology Code available for payment source | Attending: Internal Medicine | Admitting: Internal Medicine

## 2014-08-10 VITALS — BP 141/85 | HR 83 | Temp 97.6°F | Resp 18 | Ht 66.5 in | Wt 263.2 lb

## 2014-08-10 DIAGNOSIS — K5900 Constipation, unspecified: Secondary | ICD-10-CM

## 2014-08-10 DIAGNOSIS — E559 Vitamin D deficiency, unspecified: Secondary | ICD-10-CM

## 2014-08-10 DIAGNOSIS — R1030 Lower abdominal pain, unspecified: Secondary | ICD-10-CM

## 2014-08-10 DIAGNOSIS — M545 Low back pain: Secondary | ICD-10-CM

## 2014-08-10 DIAGNOSIS — G8929 Other chronic pain: Secondary | ICD-10-CM

## 2014-08-10 MED ORDER — MAGNESIUM HYDROXIDE 400 MG/5ML PO SUSP: 15.0000 mL | Freq: Every evening | ORAL | Status: DC | PRN

## 2014-08-10 MED ORDER — GABAPENTIN 300 MG PO CAPS: 300.0000 mg | ORAL_CAPSULE | Freq: Three times a day (TID) | ORAL | Status: DC

## 2014-08-10 NOTE — Progress Notes (Signed)
MRN: [id number] Name: Lorraine Ryan  Sex: female Age: 59 y.o. DOB: [birth year]  Allergies: Codeine; Tramadol; Oxycodone; and Prednisone  Chief Complaint  Patient presents with  . Groin Pain    HPI: Patient is 59 y.o. female who has to of chronic lower abdominal pain, on the last visit patient had her hearing test done which was negative as well as CT abdomen was done which was negative for any renal stones or inflammation process, patient does report being constipated denies any nausea vomiting currently denies any urinary symptoms, denies any vaginal discharge. Patient has history of chronic lower back pain/arthritis and is taking Neurontin 100 mg 3 times a day.patient also history of vitamin D deficiency.  Past Medical History  Diagnosis Date  . Hypertension     No meds prescribed; states intermittent  . Back pain   . Stroke     caused numbness in leg    Past Surgical History  Procedure Laterality Date  . Ganglion cyst excision    . Abdominal hysterectomy    . Tonsillectomy        Medication List       This list is accurate as of: 08/10/14  4:22 PM.  Always use your most recent med list.               ABILIFY PO  Take 1 tablet by mouth daily.     aspirin EC 81 MG tablet  Take 1 tablet (81 mg total) by mouth daily.     atenolol 25 MG tablet  Commonly known as:  TENORMIN  Take 1 tablet (25 mg total) by mouth daily.     BC HEADACHE PO  Take 1 packet by mouth every 6 (six) hours as needed (for headache or pain).     buPROPion 150 MG 24 hr tablet  Commonly known as:  WELLBUTRIN XL  Take 150 mg by mouth daily.     colchicine 0.6 MG tablet  Take 1 tablet (0.6 mg total) by mouth 2 (two) times daily as needed.     gabapentin 300 MG capsule  Commonly known as:  NEURONTIN  Take 1 capsule (300 mg total) by mouth 3 (three) times daily.     magnesium hydroxide 400 MG/5ML suspension  Commonly known as:  MILK OF MAGNESIA  Take 15 mLs by mouth at bedtime as  needed for mild constipation.     omeprazole 20 MG capsule  Commonly known as:  PRILOSEC  Take 1 capsule (20 mg total) by mouth daily.     ondansetron 8 MG tablet  Commonly known as:  ZOFRAN  Take 1 tablet (8 mg total) by mouth every 8 (eight) hours as needed for nausea or vomiting.     oxyCODONE-acetaminophen 5-325 MG per tablet  Commonly known as:  PERCOCET  Take 1 tablet by mouth every 6 (six) hours as needed for moderate pain.     traZODone 100 MG tablet  Commonly known as:  DESYREL  Take 100 mg by mouth at bedtime.     Vitamin D (Ergocalciferol) 50000 UNITS Caps capsule  Commonly known as:  DRISDOL  Take 1 capsule (50,000 Units total) by mouth every 7 (seven) days.        Meds ordered this encounter  Medications  . gabapentin (NEURONTIN) 300 MG capsule    Sig: Take 1 capsule (300 mg total) by mouth 3 (three) times daily.    Dispense:  90 capsule    Refill:  3  .  magnesium hydroxide (MILK OF MAGNESIA) 400 MG/5ML suspension    Sig: Take 15 mLs by mouth at bedtime as needed for mild constipation.    Dispense:  360 mL    Refill:  0    Immunization History  Administered Date(s) Administered  . Influenza,trivalent, recombinat, inj, PF 01/13/2013  . PPD Test 06/02/2013  . Rabies, IM 11/28/2012, 12/01/2012, 12/05/2012, 12/12/2012  . Tdap 11/28/2012    Family History  Problem Relation Age of Onset  . Heart disease      No family history  . Diabetes Mother   . Hypertension Mother     History  Substance Use Topics  . Smoking status: Former Research scientist (life sciences)  . Smokeless tobacco: Never Used  . Alcohol Use: No    Review of Systems   As noted in HPI  Filed Vitals:   08/10/14 1421  BP: 141/85  Pulse: 83  Temp: 97.6 F (36.4 C)  Resp: 18    Physical Exam  Physical Exam  Constitutional: No distress.  Eyes: EOM are normal. Pupils are equal, round, and reactive to light.  Cardiovascular: Normal rate and regular rhythm.   Pulmonary/Chest: Breath sounds normal. No  respiratory distress. She has no wheezes. She has no rales.  Abdominal: Soft. There is no tenderness. There is no rebound and no guarding.  No CVA tenderness  Musculoskeletal: She exhibits no edema.    CBC    Component Value Date/Time   WBC 5.7 03/07/2014 1005   RBC 4.59 03/07/2014 1005   HGB 14.0 03/07/2014 1005   HCT 41.4 03/07/2014 1005   PLT 256 03/07/2014 1005   MCV 90.2 03/07/2014 1005   LYMPHSABS 1.9 03/07/2014 1005   MONOABS 0.4 03/07/2014 1005   EOSABS 0.1 03/07/2014 1005   BASOSABS 0.1 03/07/2014 1005    CMP     Component Value Date/Time   NA 141 03/07/2014 1005   K 4.2 03/07/2014 1005   CL 106 03/07/2014 1005   CO2 24 03/07/2014 1005   GLUCOSE 76 03/07/2014 1005   BUN 10 03/07/2014 1005   CREATININE 0.66 03/07/2014 1005   CREATININE 0.61 03/06/2013 0904   CALCIUM 9.2 03/07/2014 1005   PROT 6.8 03/07/2014 1005   ALBUMIN 4.0 03/07/2014 1005   AST 19 03/07/2014 1005   ALT 24 03/07/2014 1005   ALKPHOS 92 03/07/2014 1005   BILITOT 0.9 03/07/2014 1005   GFRNONAA >89 03/07/2014 1005   GFRNONAA >90 03/06/2013 0904   GFRAA >89 03/07/2014 1005   GFRAA >90 03/06/2013 0904    Lab Results  Component Value Date/Time   CHOL 250* 03/07/2014 10:05 AM    Lab Results  Component Value Date/Time   HGBA1C 5.70 03/07/2014 09:39 AM    Lab Results  Component Value Date/Time   AST 19 03/07/2014 10:05 AM    Assessment and Plan  Chronic low back pain - Plan: have increased the dose of Neurontin gabapentin (NEURONTIN) 300 MG capsule  Lower abdominal pain/Constipation, unspecified constipation type - Plan: advised patient for high fiber diet, trial of  magnesium hydroxide (MILK OF MAGNESIA) 400 MG/5ML suspension  Vitamin D deficiency Patient will start taking over-the-counter vitamin D 2000 units daily.   Return in about 3 months (around 11/10/2014), or if symptoms worsen or fail to improve.   This note has been created with Engineer, agricultural. Any transcriptional errors are unintentional.    [name], MD

## 2014-08-10 NOTE — Progress Notes (Signed)
Patient here with complaints of right groin pain.  Patient reports it radiating from lower back through groin constantly over the past 2-3 weeks and throbbing.  Patient also complains of stomach cramping that comes and goes, as well as cramping in both hands and feet.    Medications reviewed with patient.  Patient reports taking Latuda.  Patient states she is taking everything except Abilify, Prilosec, Zofran, Vitamin D, and Trazodone.

## 2014-08-10 NOTE — Patient Instructions (Signed)

## 2014-08-15 NOTE — Telephone Encounter (Signed)
Pt was seen on 08/10/2014

## 2014-08-22 ENCOUNTER — Telehealth: Payer: Self-pay | Admitting: Internal Medicine

## 2014-08-22 NOTE — Telephone Encounter (Signed)
Pt calling to ask if she should get prescription for mobile chair. Pt states she is having difficulty standing and walking as her legs feel like they are going to give out.  Please f/u with pt.

## 2014-08-26 ENCOUNTER — Telehealth: Payer: Self-pay

## 2014-08-26 NOTE — Telephone Encounter (Signed)
Returned patient phone call Patient not available Left message on voice mail to return our call 

## 2014-08-26 NOTE — Telephone Encounter (Signed)
Pt calling to ask if she should get prescription for mobile chair. Pt states she is having difficulty standing and walking as her legs feel like they are going to give out. Please f/u with pt.

## 2014-09-05 ENCOUNTER — Encounter: Payer: Self-pay | Admitting: Internal Medicine

## 2014-09-05 ENCOUNTER — Ambulatory Visit (HOSPITAL_BASED_OUTPATIENT_CLINIC_OR_DEPARTMENT_OTHER): Payer: Medicare Other | Admitting: Internal Medicine

## 2014-09-05 ENCOUNTER — Ambulatory Visit: Payer: Medicare Other | Attending: Internal Medicine

## 2014-09-05 VITALS — BP 157/93 | HR 71 | Temp 98.0°F | Resp 16 | Wt 265.2 lb

## 2014-09-05 DIAGNOSIS — M4806 Spinal stenosis, lumbar region: Secondary | ICD-10-CM

## 2014-09-05 DIAGNOSIS — M545 Low back pain, unspecified: Secondary | ICD-10-CM

## 2014-09-05 DIAGNOSIS — R252 Cramp and spasm: Secondary | ICD-10-CM

## 2014-09-05 DIAGNOSIS — I10 Essential (primary) hypertension: Secondary | ICD-10-CM

## 2014-09-05 DIAGNOSIS — R200 Anesthesia of skin: Secondary | ICD-10-CM

## 2014-09-05 DIAGNOSIS — Z79899 Other long term (current) drug therapy: Secondary | ICD-10-CM | POA: Insufficient documentation

## 2014-09-05 DIAGNOSIS — Z8673 Personal history of transient ischemic attack (TIA), and cerebral infarction without residual deficits: Secondary | ICD-10-CM | POA: Insufficient documentation

## 2014-09-05 DIAGNOSIS — Z87891 Personal history of nicotine dependence: Secondary | ICD-10-CM | POA: Insufficient documentation

## 2014-09-05 DIAGNOSIS — M5137 Other intervertebral disc degeneration, lumbosacral region: Secondary | ICD-10-CM | POA: Diagnosis not present

## 2014-09-05 DIAGNOSIS — G8929 Other chronic pain: Secondary | ICD-10-CM

## 2014-09-05 DIAGNOSIS — Z7982 Long term (current) use of aspirin: Secondary | ICD-10-CM | POA: Diagnosis not present

## 2014-09-05 DIAGNOSIS — R208 Other disturbances of skin sensation: Secondary | ICD-10-CM

## 2014-09-05 DIAGNOSIS — M48061 Spinal stenosis, lumbar region without neurogenic claudication: Secondary | ICD-10-CM

## 2014-09-05 LAB — COMPLETE METABOLIC PANEL WITH GFR
ALBUMIN: 4.1 g/dL (ref 3.6–5.1)
ALT: 18 U/L (ref 6–29)
AST: 20 U/L (ref 10–35)
Alkaline Phosphatase: 71 U/L (ref 33–130)
BILIRUBIN TOTAL: 0.7 mg/dL (ref 0.2–1.2)
BUN: 10 mg/dL (ref 7–25)
CHLORIDE: 105 mmol/L (ref 98–110)
CO2: 25 mmol/L (ref 20–31)
Calcium: 9.6 mg/dL (ref 8.6–10.4)
Creat: 0.75 mg/dL (ref 0.50–1.05)
GFR, Est African American: >89 mL/min (ref >=60)
GFR, Est Non African American: 88 mL/min (ref >=60)
GLUCOSE: 84 mg/dL (ref 65–99)
Potassium: 4.3 mmol/L (ref 3.5–5.3)
SODIUM: 143 mmol/L (ref 135–146)
Total Protein: 7 g/dL (ref 6.1–8.1)

## 2014-09-05 LAB — VITAMIN B12: VITAMIN B 12: 460 pg/mL (ref 211–911)

## 2014-09-05 MED ORDER — BACLOFEN 10 MG PO TABS
10.0000 mg | ORAL_TABLET | Freq: Every day | ORAL | Status: DC
Start: 2014-09-05 — End: 2014-11-28

## 2014-09-05 NOTE — Patient Instructions (Signed)
DASH Eating Plan °DASH stands for "Dietary Approaches to Stop Hypertension." The DASH eating plan is a healthy eating plan that has been shown to reduce high blood pressure (hypertension). Additional health benefits may include reducing the risk of type 2 diabetes mellitus, heart disease, and stroke. The DASH eating plan may also help with weight loss. °WHAT DO I NEED TO KNOW ABOUT THE DASH EATING PLAN? °For the DASH eating plan, you will follow these general guidelines: °· Choose foods with a percent daily value for sodium of less than 5% (as listed on the food label). °· Use salt-free seasonings or herbs instead of table salt or sea salt. °· Check with your health care provider or pharmacist before using salt substitutes. °· Eat lower-sodium products, often labeled as "lower sodium" or "no salt added." °· Eat fresh foods. °· Eat more vegetables, fruits, and low-fat dairy products. °· Choose whole grains. Look for the word "whole" as the first word in the ingredient list. °· Choose fish and skinless chicken or turkey more often than red meat. Limit fish, poultry, and meat to 6 oz (170 g) each day. °· Limit sweets, desserts, sugars, and sugary drinks. °· Choose heart-healthy fats. °· Limit cheese to 1 oz (28 g) per day. °· Eat more home-cooked food and less restaurant, buffet, and fast food. °· Limit fried foods. °· Cook foods using methods other than frying. °· Limit canned vegetables. If you do use them, rinse them well to decrease the sodium. °· When eating at a restaurant, ask that your food be prepared with less salt, or no salt if possible. °WHAT FOODS CAN I EAT? °Seek help from a dietitian for individual calorie needs. °Grains °Whole grain or whole wheat bread. Brown rice. Whole grain or whole wheat pasta. Quinoa, bulgur, and whole grain cereals. Low-sodium cereals. Corn or whole wheat flour tortillas. Whole grain cornbread. Whole grain crackers. Low-sodium crackers. °Vegetables °Fresh or frozen vegetables  (raw, steamed, roasted, or grilled). Low-sodium or reduced-sodium tomato and vegetable juices. Low-sodium or reduced-sodium tomato sauce and paste. Low-sodium or reduced-sodium canned vegetables.  °Fruits °All fresh, canned (in natural juice), or frozen fruits. °Meat and Other Protein Products °Ground beef (85% or leaner), grass-fed beef, or beef trimmed of fat. Skinless chicken or turkey. Ground chicken or turkey. Pork trimmed of fat. All fish and seafood. Eggs. Dried beans, peas, or lentils. Unsalted nuts and seeds. Unsalted canned beans. °Dairy °Low-fat dairy products, such as skim or 1% milk, 2% or reduced-fat cheeses, low-fat ricotta or cottage cheese, or plain low-fat yogurt. Low-sodium or reduced-sodium cheeses. °Fats and Oils °Tub margarines without trans fats. Light or reduced-fat mayonnaise and salad dressings (reduced sodium). Avocado. Safflower, olive, or canola oils. Natural peanut or almond butter. °Other °Unsalted popcorn and pretzels. °The items listed above may not be a complete list of recommended foods or beverages. Contact your dietitian for more options. °WHAT FOODS ARE NOT RECOMMENDED? °Grains °White bread. White pasta. White rice. Refined cornbread. Bagels and croissants. Crackers that contain trans fat. °Vegetables °Creamed or fried vegetables. Vegetables in a cheese sauce. Regular canned vegetables. Regular canned tomato sauce and paste. Regular tomato and vegetable juices. °Fruits °Dried fruits. Canned fruit in light or heavy syrup. Fruit juice. °Meat and Other Protein Products °Fatty cuts of meat. Ribs, chicken wings, bacon, sausage, bologna, salami, chitterlings, fatback, hot dogs, bratwurst, and packaged luncheon meats. Salted nuts and seeds. Canned beans with salt. °Dairy °Whole or 2% milk, cream, half-and-half, and cream cheese. Whole-fat or sweetened yogurt. Full-fat   cheeses or blue cheese. Nondairy creamers and whipped toppings. Processed cheese, cheese spreads, or cheese  curds. °Condiments °Onion and garlic salt, seasoned salt, table salt, and sea salt. Canned and packaged gravies. Worcestershire sauce. Tartar sauce. Barbecue sauce. Teriyaki sauce. Soy sauce, including reduced sodium. Steak sauce. Fish sauce. Oyster sauce. Cocktail sauce. Horseradish. Ketchup and mustard. Meat flavorings and tenderizers. Bouillon cubes. Hot sauce. Tabasco sauce. Marinades. Taco seasonings. Relishes. °Fats and Oils °Butter, stick margarine, lard, shortening, ghee, and bacon fat. Coconut, palm kernel, or palm oils. Regular salad dressings. °Other °Pickles and olives. Salted popcorn and pretzels. °The items listed above may not be a complete list of foods and beverages to avoid. Contact your dietitian for more information. °WHERE CAN I FIND MORE INFORMATION? °[employer], [employer]: www.nhlbi.nih.gov/health/health-topics/topics/dash/ °Document Released: 01/10/2011 Document Revised: 06/07/2013 Document Reviewed: 11/25/2012 °ExitCare® Patient Information ©2015 [employer]. This information is not intended to replace advice given to you by your health care provider. Make sure you discuss any questions you have with your health care provider. ° °

## 2014-09-05 NOTE — Progress Notes (Signed)
Patient here for follow up Complains of pain and tingling to her right leg Has been feeling jittery on a daily basis Requesting a prescription for a motorized chair

## 2014-09-05 NOTE — Progress Notes (Signed)
MRN: [id number] Name: Lorraine Ryan  Sex: female Age: 59 y.o. DOB: 07-17-1955  Allergies: Codeine; Tramadol; Oxycodone; and Prednisone  Chief Complaint  Patient presents with  . Leg Pain    HPI: Patient is 59 y.o. female who has history of chronic lower back pain right leg numbness weakness, patient has been following up with physical therapy and is using the rollator, patient is still complaining of cramps numbness tingling, she's on Neurontin, previous MRI reviewed which was done last year which reported nerve impingement, IMPRESSION: 1. Lumbar spondylosis, degenerative disc disease, and prominent epidural adipose tissues contribute to prominent impingement at L4-5 and mild impingement at L2-3, L3-4, and L5-S1 as detailed above.  patient has not been evaluated by neurosurgery  Past Medical History  Diagnosis Date  . Hypertension     No meds prescribed; states intermittent  . Back pain   . Stroke     caused numbness in leg    Past Surgical History  Procedure Laterality Date  . Ganglion cyst excision    . Abdominal hysterectomy    . Tonsillectomy        Medication List       This list is accurate as of: 09/05/14 10:49 AM.  Always use your most recent med list.               ABILIFY PO  Take 1 tablet by mouth daily.     aspirin EC 81 MG tablet  Take 1 tablet (81 mg total) by mouth daily.     atenolol 25 MG tablet  Commonly known as:  TENORMIN  Take 1 tablet (25 mg total) by mouth daily.     baclofen 10 MG tablet  Commonly known as:  LIORESAL  Take 1 tablet (10 mg total) by mouth at bedtime.     BC HEADACHE PO  Take 1 packet by mouth every 6 (six) hours as needed (for headache or pain).     buPROPion 150 MG 24 hr tablet  Commonly known as:  WELLBUTRIN XL  Take 150 mg by mouth daily.     colchicine 0.6 MG tablet  Take 1 tablet (0.6 mg total) by mouth 2 (two) times daily as needed.     gabapentin 300 MG capsule  Commonly known as:  NEURONTIN    Take 1 capsule (300 mg total) by mouth 3 (three) times daily.     magnesium hydroxide 400 MG/5ML suspension  Commonly known as:  MILK OF MAGNESIA  Take 15 mLs by mouth at bedtime as needed for mild constipation.     omeprazole 20 MG capsule  Commonly known as:  PRILOSEC  Take 1 capsule (20 mg total) by mouth daily.     ondansetron 8 MG tablet  Commonly known as:  ZOFRAN  Take 1 tablet (8 mg total) by mouth every 8 (eight) hours as needed for nausea or vomiting.     oxyCODONE-acetaminophen 5-325 MG per tablet  Commonly known as:  PERCOCET  Take 1 tablet by mouth every 6 (six) hours as needed for moderate pain.     traZODone 100 MG tablet  Commonly known as:  DESYREL  Take 100 mg by mouth at bedtime.     Vitamin D (Ergocalciferol) 50000 UNITS Caps capsule  Commonly known as:  DRISDOL  Take 1 capsule (50,000 Units total) by mouth every 7 (seven) days.        Meds ordered this encounter  Medications  . baclofen (LIORESAL) 10 MG tablet  Sig: Take 1 tablet (10 mg total) by mouth at bedtime.    Dispense:  30 each    Refill:  0    Immunization History  Administered Date(s) Administered  . Influenza,trivalent, recombinat, inj, PF 01/13/2013  . PPD Test 06/02/2013  . Rabies, IM 11/28/2012, 12/01/2012, 12/05/2012, 12/12/2012  . Tdap 11/28/2012    Family History  Problem Relation Age of Onset  . Heart disease      No family history  . Diabetes Mother   . Hypertension Mother     History  Substance Use Topics  . Smoking status: Former Research scientist (life sciences)  . Smokeless tobacco: Never Used  . Alcohol Use: No    Review of Systems   As noted in HPI  Filed Vitals:   09/05/14 1000  BP: 157/93  Pulse: 71  Temp: 98 F (36.7 C)  Resp: 16    Physical Exam  Physical Exam  Constitutional: No distress.  Eyes: EOM are normal. Pupils are equal, round, and reactive to light.  Cardiovascular: Normal rate and regular rhythm.   Pulmonary/Chest: Breath sounds normal. No  respiratory distress. She has no wheezes. She has no rales.  Musculoskeletal:  Lower lumbar paraspinal tenderness, patient has difficulty in performing SLR on the right side secondary to weakness    Labs   Lab Results  Component Value Date   WBC 5.7 03/07/2014   HGB 14.0 03/07/2014   HCT 41.4 03/07/2014   PLT 256 03/07/2014   GLUCOSE 76 03/07/2014   CHOL 250* 03/07/2014   TRIG 84 03/07/2014   HDL 66 03/07/2014   LDLCALC 167* 03/07/2014   ALT 24 03/07/2014   AST 19 03/07/2014   NA 141 03/07/2014   K 4.2 03/07/2014   CL 106 03/07/2014   CREATININE 0.66 03/07/2014   BUN 10 03/07/2014   CO2 24 03/07/2014   TSH 1.061 03/07/2014   HGBA1C 5.70 03/07/2014    Lab Results  Component Value Date   HGBA1C 5.70 03/07/2014     Assessment and Plan  Chronic low back pain /Spinal stenosis of lumbar region /Numbness in right leg - - Plan: Ambulatory referral to Neurosurgery, baclofen (LIORESAL) 10 MG tablet, patient will continue Neurontin  Essential hypertension Patient has not taken her blood pressure medication today, advised patient for DASH diet, continue with current meds.  Cramps of right lower extremity - Plan: COMPLETE METABOLIC PANEL WITH GFR, Vit D  25 hydroxy (rtn osteoporosis monitoring), Vitamin B12    Return in about 3 months (around 12/06/2014), or if symptoms worsen or fail to improve.   This note has been created with Surveyor, quantity. Any transcriptional errors are unintentional.    [name], MD

## 2014-09-06 ENCOUNTER — Telehealth: Payer: Self-pay

## 2014-09-06 DIAGNOSIS — E559 Vitamin D deficiency, unspecified: Secondary | ICD-10-CM

## 2014-09-06 LAB — VITAMIN D 25 HYDROXY (VIT D DEFICIENCY, FRACTURES): Vit D, 25-Hydroxy: 25 ng/mL — ABNORMAL LOW (ref 30–100)

## 2014-09-06 MED ORDER — VITAMIN D (ERGOCALCIFEROL) 1.25 MG (50000 UNIT) PO CAPS: 50000.0000 [IU] | ORAL_CAPSULE | ORAL | Status: DC

## 2014-09-06 NOTE — Telephone Encounter (Signed)
Patient is aware of her lab results [facility] D [facility]

## 2014-09-06 NOTE — Telephone Encounter (Signed)
-----   Message from [name], MD sent at 09/06/2014  9:40 AM EDT ----- Blood work reviewed, noticed low vitamin D, call patient advise to start ergocalciferol 50,000 units once a week for the duration of  12 weeks, then take OTC vitamin d 2000 units daily.

## 2014-09-22 ENCOUNTER — Telehealth: Payer: Self-pay | Admitting: Internal Medicine

## 2014-09-22 NOTE — Telephone Encounter (Signed)
Patient called requesting medication refill on atenolol (TENORMIN) 25 MG tablet , patient is completely out of medication. Please f/u

## 2014-09-28 ENCOUNTER — Ambulatory Visit: Payer: Self-pay | Admitting: Nurse Practitioner

## 2014-09-28 ENCOUNTER — Telehealth: Payer: Self-pay | Admitting: Nurse Practitioner

## 2014-09-28 ENCOUNTER — Encounter: Payer: Self-pay | Admitting: Nurse Practitioner

## 2014-09-28 NOTE — Telephone Encounter (Signed)
PATIENT WAS A NO SHOW AND LETTER SENT  °

## 2014-09-29 NOTE — Telephone Encounter (Signed)
Noted  

## 2014-11-18 ENCOUNTER — Telehealth: Payer: Self-pay

## 2014-11-18 ENCOUNTER — Telehealth: Payer: Self-pay | Admitting: Internal Medicine

## 2014-11-18 ENCOUNTER — Ambulatory Visit: Payer: No Typology Code available for payment source | Admitting: Internal Medicine

## 2014-11-18 DIAGNOSIS — I498 Other specified cardiac arrhythmias: Secondary | ICD-10-CM

## 2014-11-18 DIAGNOSIS — I10 Essential (primary) hypertension: Secondary | ICD-10-CM

## 2014-11-18 MED ORDER — ATENOLOL 25 MG PO TABS: 25.0000 mg | ORAL_TABLET | Freq: Every day | ORAL | Status: DC

## 2014-11-18 NOTE — Telephone Encounter (Signed)
Nurse called patient, reached voicemail. Left message for patient to call Peyson Postema with [facility], at [phone]. Nurse called to verify status of patient's atenolol refill.  Nurse will send medication to pharmacy.

## 2014-11-18 NOTE — Telephone Encounter (Signed)
Patient was late for appointment but needs medication refills for her stomach ulcer and muscle relaxer.  She is completely out of those medications. Please f/u with patient ASAP to let her know if she can have those medications refilled before her next appointment.

## 2014-11-28 ENCOUNTER — Ambulatory Visit (HOSPITAL_COMMUNITY)
Admission: RE | Admit: 2014-11-28 | Discharge: 2014-11-28 | Disposition: A | Discharge status: Home / Self Care | Payer: Medicare Other | Source: Ambulatory Visit | Attending: Internal Medicine | Admitting: Internal Medicine

## 2014-11-28 ENCOUNTER — Encounter: Payer: Self-pay | Admitting: Internal Medicine

## 2014-11-28 ENCOUNTER — Other Ambulatory Visit: Payer: Self-pay

## 2014-11-28 ENCOUNTER — Ambulatory Visit: Payer: No Typology Code available for payment source | Attending: Internal Medicine | Admitting: Internal Medicine

## 2014-11-28 VITALS — BP 137/87 | HR 94 | Temp 98.0°F | Resp 16 | Ht 66.0 in | Wt 266.2 lb

## 2014-11-28 DIAGNOSIS — K219 Gastro-esophageal reflux disease without esophagitis: Secondary | ICD-10-CM | POA: Insufficient documentation

## 2014-11-28 DIAGNOSIS — M47896 Other spondylosis, lumbar region: Secondary | ICD-10-CM | POA: Diagnosis not present

## 2014-11-28 DIAGNOSIS — M47816 Spondylosis without myelopathy or radiculopathy, lumbar region: Secondary | ICD-10-CM

## 2014-11-28 DIAGNOSIS — I10 Essential (primary) hypertension: Secondary | ICD-10-CM

## 2014-11-28 DIAGNOSIS — I498 Other specified cardiac arrhythmias: Secondary | ICD-10-CM

## 2014-11-28 MED ORDER — ATENOLOL 25 MG PO TABS: 25.0000 mg | ORAL_TABLET | Freq: Every day | ORAL | Status: DC

## 2014-11-28 MED ORDER — GABAPENTIN 300 MG PO CAPS: 300.0000 mg | ORAL_CAPSULE | Freq: Three times a day (TID) | ORAL | Status: DC

## 2014-11-28 MED ORDER — BACLOFEN 10 MG PO TABS: 10.0000 mg | ORAL_TABLET | Freq: Every day | ORAL | Status: DC

## 2014-11-28 MED ORDER — OMEPRAZOLE 20 MG PO CPDR: 20.0000 mg | DELAYED_RELEASE_CAPSULE | Freq: Every day | ORAL | Status: DC

## 2014-11-28 NOTE — Progress Notes (Signed)
Patient here for follow up Complains of having some numbness to her right leg and having A funny feeling in her chest the past couple of months

## 2014-11-28 NOTE — Progress Notes (Signed)
Patient ID: Lorraine Ryan, female   DOB: Apr 18, 1955, 59 y.o.   MRN: [id number]  [id number]  [id number]  DOB - 05/03/1955       HPI: Lorraine Ryan is a 59 y.o. female here today to reestablish care and for low back pain and right leg weakness and tingling. Patient has history of HTN, Stroke (2013) and Hyperlipidemia. Patient presents today with complaints of continuing low back pain which radiates to right leg and foot, patient states pain is 10 out of 10 and is not relieved by gabapentin, baclofen or otc NSAIDs. Patient describes the pain as "crampy and burning" is ongoing for more than 2 years and started with her stroke in 2013. Patient had MRI 03/2013 with diagnosis of Lumbar spondylosis, DDD L4-5, Mild impingement L2-3, L3-4, L5-S1. Patient is ambulating with the assistance of a rollator. Patient was referred to neurosurgery but does not have insurance and states she can not afford the cost of visit or surgery at this time.   Patient also with complaints of left lower chest pain. Patient states pain occurs with movement and is "cramping and usually relieved by [facility] powders". Patient states pain is sporadic and rated as 6 of 10 has been ongoing for last 2 years. Patient has been seen by cardiology 2 times for and  had extensive evaluation in February of 2015 by Dr. Stanford Breed of cardiology. EKG was normal, echocardiogram was normal except for moderate concentric left ventricular hypertrophy and dobutamine echo was normal. CT showed no pulmonary embolus. Patient is poor historian, extensive chart review completed.   Allergies  Allergen Reactions  . Codeine     Headache  . Tramadol Nausea Only  . Oxycodone Nausea Only and Palpitations  . Prednisone Other (See Comments)    Headache   Past Medical History  Diagnosis Date  . Hypertension     No meds prescribed; states intermittent  . Back pain   . Stroke     caused numbness in leg   Current Outpatient Prescriptions on File Prior to  Visit  Medication Sig Dispense Refill  . ARIPiprazole (ABILIFY PO) Take 1 tablet by mouth daily.    Marland Kitchen aspirin EC 81 MG tablet Take 1 tablet (81 mg total) by mouth daily. 90 tablet 3  . Aspirin-Salicylamide-Caffeine (BC HEADACHE PO) Take 1 packet by mouth every 6 (six) hours as needed (for headache or pain).    Marland Kitchen atenolol (TENORMIN) 25 MG tablet Take 1 tablet (25 mg total) by mouth daily. 30 tablet 3  . baclofen (LIORESAL) 10 MG tablet Take 1 tablet (10 mg total) by mouth at bedtime. 30 each 0  . buPROPion (WELLBUTRIN XL) 150 MG 24 hr tablet Take 150 mg by mouth daily.    . colchicine 0.6 MG tablet Take 1 tablet (0.6 mg total) by mouth 2 (two) times daily as needed. 60 tablet 0  . gabapentin (NEURONTIN) 300 MG capsule Take 1 capsule (300 mg total) by mouth 3 (three) times daily. 90 capsule 3  . magnesium hydroxide (MILK OF MAGNESIA) 400 MG/5ML suspension Take 15 mLs by mouth at bedtime as needed for mild constipation. 360 mL 0  . omeprazole (PRILOSEC) 20 MG capsule Take 1 capsule (20 mg total) by mouth daily. (Patient not taking: Reported on 08/10/2014) 30 capsule 3  . ondansetron (ZOFRAN) 8 MG tablet Take 1 tablet (8 mg total) by mouth every 8 (eight) hours as needed for nausea or vomiting. (Patient not taking: Reported on 05/18/2014) 12 tablet 0  . oxyCODONE-acetaminophen (PERCOCET)  5-325 MG per tablet Take 1 tablet by mouth every 6 (six) hours as needed for moderate pain. 15 tablet 0  . traZODone (DESYREL) 100 MG tablet Take 100 mg by mouth at bedtime.    . Vitamin D, Ergocalciferol, (DRISDOL) 50000 UNITS CAPS capsule Take 1 capsule (50,000 Units total) by mouth every 7 (seven) days. 12 capsule 0   No current facility-administered medications on file prior to visit.   Family History  Problem Relation Age of Onset  . Heart disease      No family history  . Diabetes Mother   . Hypertension Mother    Social History   Social History  . Marital Status: Divorced    Spouse Name: N/A  . Number  of Children: 5  . Years of Education: N/A   Occupational History  . Not on file.   Social History Main Topics  . Smoking status: Former Research scientist (life sciences)  . Smokeless tobacco: Never Used  . Alcohol Use: No  . Drug Use: No  . Sexual Activity: Not on file   Other Topics Concern  . Not on file   Social History Narrative    Review of Systems  Constitutional: Negative.   HENT: Negative.   Cardiovascular: Positive for chest pain.       Under left breast - movement of left arm causes pain  Gastrointestinal: Positive for heartburn.  Genitourinary: Negative.   Musculoskeletal: Positive for back pain.       Radiates to right leg  Skin: Negative.   Neurological: Positive for tingling.       Right leg  Endo/Heme/Allergies: Negative.   Psychiatric/Behavioral: Negative.       Objective:  BP 137/87 mmHg  Pulse 94  Temp(Src) 98 F (36.7 C)  Resp 16  Ht 5\' 6"  (1.676 m)  Wt 266 lb 3.2 oz (120.748 kg)  BMI 42.99 kg/m2  SpO2 100%  Physical Exam  Constitutional: She is oriented to person, place, and time. She appears well-developed.  HENT:  Head: Normocephalic and atraumatic.  Right Ear: External ear normal.  Left Ear: External ear normal.  Nose: Nose normal.  Mouth/Throat: Oropharynx is clear and moist.  Eyes: Conjunctivae and EOM are normal. Pupils are equal, round, and reactive to light.  Neck: Normal range of motion. Neck supple.  Cardiovascular: Normal rate, regular rhythm, normal heart sounds and intact distal pulses.   Pulmonary/Chest: Effort normal and breath sounds normal.  Abdominal: Soft. Bowel sounds are normal.  Musculoskeletal: She exhibits edema and tenderness.       Lumbar back: She exhibits tenderness, pain and spasm.  Lumbar back and right leg with normal strength and movement on exam.  Neurological: She is alert and oriented to person, place, and time. She has normal reflexes.  Skin: Skin is warm and dry.  Psychiatric:  Patient followed by Beverly Sessions for depression  and anxiety.  Nursing note and vitals reviewed.    Lab Results  Component Value Date   WBC 5.7 03/07/2014   HGB 14.0 03/07/2014   HCT 41.4 03/07/2014   MCV 90.2 03/07/2014   PLT 256 03/07/2014   Lab Results  Component Value Date   CREATININE 0.75 09/05/2014   BUN 10 09/05/2014   NA 143 09/05/2014   K 4.3 09/05/2014   CL 105 09/05/2014   CO2 25 09/05/2014    Lab Results  Component Value Date   HGBA1C 5.70 03/07/2014   Lipid Panel     Component Value Date/Time   CHOL 250* 03/07/2014  1005   TRIG 84 03/07/2014 1005   HDL 66 03/07/2014 1005   CHOLHDL 3.8 03/07/2014 1005   VLDL 17 03/07/2014 1005   LDLCALC 167* 03/07/2014 1005       Assessment and plan:   Lorraine Ryan was seen today for leg pain.  Diagnoses and all orders for this visit:  Lumbar spondylosis, unspecified spinal osteoarthritis -     gabapentin (NEURONTIN) 300 MG capsule; Take 1 capsule (300 mg total) by mouth 3 (three) times daily. -     baclofen (LIORESAL) 10 MG tablet; Take 1 tablet (10 mg total) by mouth at bedtime. -     Ambulatory referral to Orthopedic Surgery       Extensive counseling completed about her options for treatment of her pain. She will be referred to Ortho from here. Patient verbalized understanding.   Essential hypertension -     atenolol (TENORMIN) 25 MG tablet; Take 1 tablet (25 mg total) by mouth daily.       We have discussed target blood pressure range and blood pressure goal. I have advised patient to check BP regularly and to call us back or report to clinic if the numbers are consistently higher than 140/90. We consequences of uncontrolled hypertension discussed.   Fluttering heart (HCC) -     EKG 12-Lead       Extensive review of patient records show patient has had extensive review of heart and related chest pain. EKG today is normal and patient was reassured. Patient was instructed that if pain worsens or is continuous she should come back to the clinic for evaluation or go  to local emergency room.   Gastroesophageal reflux disease, esophagitis presence not specified -     omeprazole (PRILOSEC) 20 MG capsule; Take 1 capsule (20 mg total) by mouth daily. Discussed diet and weight with patient relating to acid reflux.  Went over things that may exacerbate acid reflux such as tomatoes, spicy foods, coffee, carbonated beverages, chocolates, etc.  Advised patient to avoid laying down at least two hours after meals and sleep with HOB elevated.   The patient was given clear instructions to go to ER or return to medical center if symptoms don't improve, worsen or new problems develop. The patient verbalized understanding.   Return in about 3 months (around 02/28/2015) for Hypertension.   Clois Dupes, RN, BSN, AGNP-APP Student [employer] [facility] [phone] 11/28/2014, 9:31 AM

## 2014-12-09 DIAGNOSIS — F323 Major depressive disorder, single episode, severe with psychotic features: Secondary | ICD-10-CM | POA: Diagnosis not present

## 2014-12-09 DIAGNOSIS — F331 Major depressive disorder, recurrent, moderate: Secondary | ICD-10-CM | POA: Diagnosis not present

## 2015-01-31 ENCOUNTER — Ambulatory Visit: Payer: Medicare Other | Attending: Internal Medicine | Admitting: Internal Medicine

## 2015-01-31 ENCOUNTER — Other Ambulatory Visit (HOSPITAL_COMMUNITY): Payer: Self-pay | Admitting: Orthopaedic Surgery

## 2015-01-31 ENCOUNTER — Encounter: Payer: Self-pay | Admitting: Internal Medicine

## 2015-01-31 VITALS — BP 131/89 | HR 69 | Temp 98.0°F | Resp 16 | Ht 66.0 in | Wt 266.2 lb

## 2015-01-31 DIAGNOSIS — Z5321 Procedure and treatment not carried out due to patient leaving prior to being seen by health care provider: Secondary | ICD-10-CM | POA: Insufficient documentation

## 2015-01-31 DIAGNOSIS — M79604 Pain in right leg: Secondary | ICD-10-CM | POA: Diagnosis not present

## 2015-01-31 DIAGNOSIS — R109 Unspecified abdominal pain: Secondary | ICD-10-CM | POA: Diagnosis not present

## 2015-01-31 NOTE — Progress Notes (Signed)
Patient here for follow up on her stomach pain, and right leg pain Patient also requesting a referral to [facility]

## 2015-01-31 NOTE — Progress Notes (Signed)
Patient ID: Lorraine Ryan, female   DOB: 04/01/1955, 59 y.o.   MRN: [id number]  Patient left room before being seen. Unable to locate patient. Front desk and LPN attempted to locate patient without success.   Lance Bosch, NP 01/31/2015 3:30 PM

## 2015-02-04 DIAGNOSIS — R109 Unspecified abdominal pain: Secondary | ICD-10-CM | POA: Diagnosis not present

## 2015-02-04 DIAGNOSIS — R112 Nausea with vomiting, unspecified: Secondary | ICD-10-CM | POA: Diagnosis not present

## 2015-02-04 DIAGNOSIS — E7800 Pure hypercholesterolemia, unspecified: Secondary | ICD-10-CM | POA: Diagnosis not present

## 2015-02-04 DIAGNOSIS — I10 Essential (primary) hypertension: Secondary | ICD-10-CM | POA: Diagnosis not present

## 2015-02-04 DIAGNOSIS — K219 Gastro-esophageal reflux disease without esophagitis: Secondary | ICD-10-CM | POA: Diagnosis not present

## 2015-02-04 DIAGNOSIS — Z79899 Other long term (current) drug therapy: Secondary | ICD-10-CM | POA: Diagnosis not present

## 2015-02-04 DIAGNOSIS — E119 Type 2 diabetes mellitus without complications: Secondary | ICD-10-CM | POA: Diagnosis not present

## 2015-02-04 DIAGNOSIS — R197 Diarrhea, unspecified: Secondary | ICD-10-CM | POA: Diagnosis not present

## 2015-02-07 MED ORDER — DEXTROSE 5 % IV SOLN
3.0000 g | INTRAVENOUS | Status: AC
  Administered 2015-02-08: 3 g via INTRAVENOUS
  Filled 2015-02-07: qty 3000

## 2015-02-07 MED ORDER — CHLORHEXIDINE GLUCONATE 4 % EX LIQD: 60.0000 mL | Freq: Once | CUTANEOUS | Status: DC

## 2015-02-07 NOTE — H&P (Signed)
Lorraine Ryan is an 60 y.o. female.   HISTORY OF PRESENT ILLNESS:  A 60 year old female sent to me by Dr. Alphonzo Severance for lumbar surgical decompression evaluation.  She has had low back pain and increased right leg pain.  Patient has worked as a Quarry manager for many years.  She was seen by Dr. Lynann Ryan a year ago.  She has severe stenosis at 4-5 and had some disc degeneration at 5-1.  MRI on August 04, 2013 showed minimal bulge at L3-4.  Severe stenosis at L4-5, unchanged from previous MRI.  L5-S1 showed shallow disc bulge and some epidural fat.  Neural foramina were open.     The patient did not have any anterolisthesis at L4-5.  She had multifactorial severe central stenosis with lateral recess stenosis.     She is diabetic.  She does not know what her A1c is currently.  The patient states she has pain with standing.  Difficulty with ambulation.     CURRENT MEDICATIONS:  Gabapentin.  The patient has been treated with muscle relaxants, Tylenol #3, Flexeril, Medrol Dosepak.     PAST MEDICAL/SURGICAL HISTORY:  Surgeries:  Ganglion cyst excision in 2012-2013.    SOCIAL HISTORY:  The patient is single.  She does not smoke.  She used to smoke a half-pack per day for about 4 years.     FAMILY HISTORY:  Positive for diabetes, heart disease, and hypertension.     REVIEW OF SYSTEMS:  Positive for anxiety, arthritis, bladder problems, depression, diabetes, heart disease, hypertension, migraines, osteoporosis, sleep apnea, stroke, and ulcers.      Past Medical History  Diagnosis Date  . Hypertension     No meds prescribed; states intermittent  . Back pain   . Stroke [facility])     caused numbness in leg    Past Surgical History  Procedure Laterality Date  . Ganglion cyst excision    . Abdominal hysterectomy    . Tonsillectomy      Family History  Problem Relation Age of Onset  . Heart disease      No family history  . Diabetes Mother   . Hypertension Mother    Social History:  reports that she has  quit smoking. She has never used smokeless tobacco. She reports that she does not drink alcohol or use illicit drugs.  Allergies:  Allergies  Allergen Reactions  . Codeine Other (See Comments)    Headache  . Oxycodone Nausea Only and Palpitations  . Prednisone Other (See Comments)    Headache  . Tramadol Nausea Only    No prescriptions prior to admission    No results found for this or any previous visit (from the past 48 hour(s)). No results found.  Review of Systems  Constitutional: Negative.   HENT: Negative.   Eyes: Negative.   Respiratory: Negative.   Cardiovascular: Negative.   Genitourinary: Negative.   Musculoskeletal: Positive for back pain.  Skin: Negative.   Psychiatric/Behavioral: Negative.     There were no vitals taken for this visit. Physical Exam  Constitutional: She is oriented to person, place, and time.  HENT:  Head: Atraumatic.  Eyes: EOM are normal.  Neck: Normal range of motion.  Cardiovascular: Normal rate.   Respiratory: No respiratory distress.  GI: She exhibits no distension.  Musculoskeletal: She exhibits tenderness.  Neurological: She is alert and oriented to person, place, and time.  Skin: Skin is warm and dry.  Psychiatric: She has a normal mood and affect.  PHYSICAL EXAMINATION:  The patient is alert and oriented.  No carotid bruits.  No audible wheezes.  Pulse is regular at 72.  BP 148/90.  No abdominal tenderness.  Negative straight leg raising.  Anterior tib EHL is intact.     ASSESSMENT/DIAGNOSIS:  L4-5 stenosis, severe.     PLAN:  We discussed options.  I would recommend a single-level decompression at the L4-5 level.  She has intact pars.  Does not have any anterolisthesis.  Plan would be overnight stay.  Procedure was discussed.  Questions were listed and answered.  Risks of surgery discussed including progression, need for later fusion.  All questions answered.  She understands and requests to proceed.      Lorraine Ryan  M 02/07/2015, 3:12 PM

## 2015-02-07 NOTE — Progress Notes (Signed)
I was unable to reach patient by phone.  I left  A message on voice mail.  I instructed the patient to arrive at [place] entrance at 1225 pm  , nothing to eat or drink after midnight.   I instructed the patient to take the following medications in the am with just enough water to get them down:Atenolol, Bupropion, Gabapentin, Omeprazole;  Percocet if needed.  I asked patient to not wear any lotions, powders, cologne, jewelry, piercing, make-up or nail polish. DO not shave. I asked the patient to call [phone], in the am if there were any questions or problems.

## 2015-02-08 ENCOUNTER — Inpatient Hospital Stay (HOSPITAL_COMMUNITY): Payer: Medicare Other | Admitting: Certified Registered"

## 2015-02-08 ENCOUNTER — Observation Stay (HOSPITAL_COMMUNITY): Payer: Medicare Other

## 2015-02-08 ENCOUNTER — Observation Stay (HOSPITAL_COMMUNITY)
Admission: RE | Admit: 2015-02-08 | Discharge: 2015-02-10 | Disposition: A | Discharge status: Home / Self Care | Payer: Medicare Other | Source: Ambulatory Visit | Attending: Orthopaedic Surgery | Admitting: Orthopaedic Surgery

## 2015-02-08 ENCOUNTER — Encounter (HOSPITAL_COMMUNITY)
Admission: RE | Disposition: A | Discharge status: Home / Self Care | Payer: Self-pay | Source: Ambulatory Visit | Attending: Orthopaedic Surgery

## 2015-02-08 ENCOUNTER — Encounter (HOSPITAL_COMMUNITY): Payer: Self-pay | Admitting: Certified Registered"

## 2015-02-08 DIAGNOSIS — I10 Essential (primary) hypertension: Secondary | ICD-10-CM | POA: Diagnosis not present

## 2015-02-08 DIAGNOSIS — Z8673 Personal history of transient ischemic attack (TIA), and cerebral infarction without residual deficits: Secondary | ICD-10-CM | POA: Insufficient documentation

## 2015-02-08 DIAGNOSIS — E119 Type 2 diabetes mellitus without complications: Secondary | ICD-10-CM | POA: Insufficient documentation

## 2015-02-08 DIAGNOSIS — Z87891 Personal history of nicotine dependence: Secondary | ICD-10-CM | POA: Diagnosis not present

## 2015-02-08 DIAGNOSIS — Z419 Encounter for procedure for purposes other than remedying health state, unspecified: Secondary | ICD-10-CM

## 2015-02-08 DIAGNOSIS — Z9889 Other specified postprocedural states: Secondary | ICD-10-CM | POA: Diagnosis not present

## 2015-02-08 DIAGNOSIS — M4806 Spinal stenosis, lumbar region: Secondary | ICD-10-CM | POA: Diagnosis not present

## 2015-02-08 HISTORY — PX: LUMBAR LAMINECTOMY/DECOMPRESSION MICRODISCECTOMY: SHX5026

## 2015-02-08 LAB — CBC
HCT: 40.5 % (ref 36.0–46.0)
Hemoglobin: 13.2 g/dL (ref 12.0–15.0)
MCH: 29.8 pg (ref 26.0–34.0)
MCHC: 32.6 g/dL (ref 30.0–36.0)
MCV: 91.4 fL (ref 78.0–100.0)
PLATELETS: 207 10*3/uL (ref 150–400)
RBC: 4.43 MIL/uL (ref 3.87–5.11)
RDW: 13.2 % (ref 11.5–15.5)
WBC: 4.3 10*3/uL (ref 4.0–10.5)

## 2015-02-08 LAB — COMPREHENSIVE METABOLIC PANEL
ALT: 34 U/L (ref 14–54)
AST: 30 U/L (ref 15–41)
Albumin: 3.6 g/dL (ref 3.5–5.0)
Alkaline Phosphatase: 69 U/L (ref 38–126)
Anion gap: 9 (ref 5–15)
BUN: 8 mg/dL (ref 6–20)
CALCIUM: 9.1 mg/dL (ref 8.9–10.3)
CHLORIDE: 109 mmol/L (ref 101–111)
CO2: 27 mmol/L (ref 22–32)
Creatinine, Ser: 0.8 mg/dL (ref 0.44–1.00)
GFR calc non Af Amer: >60 mL/min (ref >60)
Glucose, Bld: 82 mg/dL (ref 65–99)
Potassium: 3.5 mmol/L (ref 3.5–5.1)
SODIUM: 145 mmol/L (ref 135–145)
Total Bilirubin: 0.5 mg/dL (ref 0.3–1.2)
Total Protein: 6.4 g/dL — ABNORMAL LOW (ref 6.5–8.1)

## 2015-02-08 LAB — SURGICAL PCR SCREEN
MRSA, PCR: NEGATIVE
STAPHYLOCOCCUS AUREUS: NEGATIVE

## 2015-02-08 LAB — GLUCOSE, CAPILLARY
GLUCOSE-CAPILLARY: 81 mg/dL (ref 65–99)
GLUCOSE-CAPILLARY: 87 mg/dL (ref 65–99)

## 2015-02-08 LAB — PROTIME-INR
INR: 1.07 (ref 0.00–1.49)
Prothrombin Time: 14.1 seconds (ref 11.6–15.2)

## 2015-02-08 SURGERY — LUMBAR LAMINECTOMY/DECOMPRESSION MICRODISCECTOMY
Anesthesia: General

## 2015-02-08 MED ORDER — MIDAZOLAM HCL 2 MG/2ML IJ SOLN
INTRAMUSCULAR | Status: AC
  Filled 2015-02-08: qty 2

## 2015-02-08 MED ORDER — OXYCODONE-ACETAMINOPHEN 5-325 MG PO TABS
1.0000 | ORAL_TABLET | ORAL | Status: DC | PRN
  Administered 2015-02-08 – 2015-02-10 (×6): 2 via ORAL
  Filled 2015-02-08 (×5): qty 2

## 2015-02-08 MED ORDER — SUCCINYLCHOLINE CHLORIDE 20 MG/ML IJ SOLN
INTRAMUSCULAR | Status: AC
  Filled 2015-02-08: qty 1

## 2015-02-08 MED ORDER — HYDROMORPHONE HCL 1 MG/ML IJ SOLN
0.2500 mg | INTRAMUSCULAR | Status: DC | PRN
  Administered 2015-02-08 (×2): 0.5 mg via INTRAVENOUS

## 2015-02-08 MED ORDER — POTASSIUM CHLORIDE IN NACL 20-0.45 MEQ/L-% IV SOLN
INTRAVENOUS | Status: DC
  Administered 2015-02-08: 50 mL/h via INTRAVENOUS
  Administered 2015-02-09: 21:00:00 via INTRAVENOUS
  Filled 2015-02-08 (×3): qty 1000

## 2015-02-08 MED ORDER — ROCURONIUM BROMIDE 50 MG/5ML IV SOLN
INTRAVENOUS | Status: AC
  Filled 2015-02-08: qty 1

## 2015-02-08 MED ORDER — LURASIDONE HCL 20 MG PO TABS
20.0000 mg | ORAL_TABLET | Freq: Every day | ORAL | Status: DC
  Administered 2015-02-09 – 2015-02-10 (×2): 20 mg via ORAL
  Filled 2015-02-08 (×3): qty 1

## 2015-02-08 MED ORDER — SODIUM CHLORIDE 0.9 % IJ SOLN
3.0000 mL | Freq: Two times a day (BID) | INTRAMUSCULAR | Status: DC
  Administered 2015-02-09: 3 mL via INTRAVENOUS

## 2015-02-08 MED ORDER — LIDOCAINE HCL (CARDIAC) 20 MG/ML IV SOLN
INTRAVENOUS | Status: DC | PRN
  Administered 2015-02-08: 60 mg via INTRAVENOUS

## 2015-02-08 MED ORDER — ATENOLOL 25 MG PO TABS
25.0000 mg | ORAL_TABLET | Freq: Once | ORAL | Status: AC
  Administered 2015-02-08: 25 mg via ORAL
  Filled 2015-02-08: qty 1

## 2015-02-08 MED ORDER — FENTANYL CITRATE (PF) 100 MCG/2ML IJ SOLN
INTRAMUSCULAR | Status: DC | PRN
Start: 2015-02-08 — End: 2015-02-08
  Administered 2015-02-08: 100 ug via INTRAVENOUS
  Administered 2015-02-08 (×2): 50 ug via INTRAVENOUS

## 2015-02-08 MED ORDER — METHOCARBAMOL 500 MG PO TABS
ORAL_TABLET | ORAL | Status: AC
  Filled 2015-02-08: qty 1

## 2015-02-08 MED ORDER — LACTATED RINGERS IV SOLN: INTRAVENOUS | Status: DC

## 2015-02-08 MED ORDER — SUGAMMADEX SODIUM 500 MG/5ML IV SOLN
INTRAVENOUS | Status: DC | PRN
  Administered 2015-02-08: 250 mg via INTRAVENOUS

## 2015-02-08 MED ORDER — OXYCODONE-ACETAMINOPHEN 5-325 MG PO TABS
ORAL_TABLET | ORAL | Status: AC
  Filled 2015-02-08: qty 2

## 2015-02-08 MED ORDER — BUPROPION HCL ER (XL) 150 MG PO TB24
150.0000 mg | ORAL_TABLET | Freq: Every day | ORAL | Status: DC
  Administered 2015-02-08 – 2015-02-10 (×3): 150 mg via ORAL
  Filled 2015-02-08 (×3): qty 1

## 2015-02-08 MED ORDER — METHOCARBAMOL 1000 MG/10ML IJ SOLN
500.0000 mg | Freq: Four times a day (QID) | INTRAVENOUS | Status: DC | PRN
  Filled 2015-02-08: qty 5

## 2015-02-08 MED ORDER — ROCURONIUM BROMIDE 100 MG/10ML IV SOLN
INTRAVENOUS | Status: DC | PRN
  Administered 2015-02-08: 50 mg via INTRAVENOUS
  Administered 2015-02-08: 20 mg via INTRAVENOUS

## 2015-02-08 MED ORDER — CEFAZOLIN SODIUM 1-5 GM-% IV SOLN
1.0000 g | Freq: Three times a day (TID) | INTRAVENOUS | Status: AC
  Administered 2015-02-08 – 2015-02-09 (×2): 1 g via INTRAVENOUS
  Filled 2015-02-08 (×2): qty 50

## 2015-02-08 MED ORDER — PANTOPRAZOLE SODIUM 40 MG PO TBEC
40.0000 mg | DELAYED_RELEASE_TABLET | Freq: Every day | ORAL | Status: DC
  Administered 2015-02-09 – 2015-02-10 (×2): 40 mg via ORAL
  Filled 2015-02-08 (×2): qty 1

## 2015-02-08 MED ORDER — LIDOCAINE HCL (CARDIAC) 20 MG/ML IV SOLN
INTRAVENOUS | Status: AC
  Filled 2015-02-08: qty 5

## 2015-02-08 MED ORDER — SODIUM CHLORIDE 0.9 % IJ SOLN: 3.0000 mL | INTRAMUSCULAR | Status: DC | PRN

## 2015-02-08 MED ORDER — HYDROMORPHONE HCL 1 MG/ML IJ SOLN
INTRAMUSCULAR | Status: AC
  Administered 2015-02-08: 0.5 mg via INTRAVENOUS
  Filled 2015-02-08: qty 1

## 2015-02-08 MED ORDER — ONDANSETRON HCL 4 MG/2ML IJ SOLN
INTRAMUSCULAR | Status: AC
  Filled 2015-02-08: qty 2

## 2015-02-08 MED ORDER — ATENOLOL 50 MG PO TABS
25.0000 mg | ORAL_TABLET | Freq: Every day | ORAL | Status: DC
  Administered 2015-02-10: 25 mg via ORAL
  Filled 2015-02-08 (×3): qty 1

## 2015-02-08 MED ORDER — MEPERIDINE HCL 25 MG/ML IJ SOLN: 6.2500 mg | INTRAMUSCULAR | Status: DC | PRN

## 2015-02-08 MED ORDER — DULOXETINE HCL 30 MG PO CPEP
30.0000 mg | ORAL_CAPSULE | Freq: Every day | ORAL | Status: DC
  Administered 2015-02-08 – 2015-02-10 (×3): 30 mg via ORAL
  Filled 2015-02-08 (×3): qty 1

## 2015-02-08 MED ORDER — BUPIVACAINE HCL (PF) 0.25 % IJ SOLN
INTRAMUSCULAR | Status: AC
  Filled 2015-02-08: qty 30

## 2015-02-08 MED ORDER — BUPIVACAINE HCL (PF) 0.25 % IJ SOLN
INTRAMUSCULAR | Status: DC | PRN
  Administered 2015-02-08: 8 mL

## 2015-02-08 MED ORDER — FENTANYL CITRATE (PF) 250 MCG/5ML IJ SOLN
INTRAMUSCULAR | Status: AC
  Filled 2015-02-08: qty 5

## 2015-02-08 MED ORDER — POLYETHYLENE GLYCOL 3350 17 G PO PACK: 17.0000 g | PACK | Freq: Every day | ORAL | Status: DC | PRN

## 2015-02-08 MED ORDER — PROPOFOL 10 MG/ML IV BOLUS
INTRAVENOUS | Status: DC | PRN
  Administered 2015-02-08: 140 mg via INTRAVENOUS

## 2015-02-08 MED ORDER — TRAZODONE HCL 100 MG PO TABS
100.0000 mg | ORAL_TABLET | Freq: Every day | ORAL | Status: DC
  Administered 2015-02-08 – 2015-02-09 (×2): 100 mg via ORAL
  Filled 2015-02-08 (×2): qty 1

## 2015-02-08 MED ORDER — MIDAZOLAM HCL 5 MG/5ML IJ SOLN
INTRAMUSCULAR | Status: DC | PRN
  Administered 2015-02-08: 1 mg via INTRAVENOUS

## 2015-02-08 MED ORDER — LACTATED RINGERS IV SOLN
INTRAVENOUS | Status: DC
  Administered 2015-02-08 (×2): via INTRAVENOUS

## 2015-02-08 MED ORDER — ONDANSETRON HCL 4 MG/2ML IJ SOLN
INTRAMUSCULAR | Status: DC | PRN
  Administered 2015-02-08: 4 mg via INTRAVENOUS

## 2015-02-08 MED ORDER — DOCUSATE SODIUM 100 MG PO CAPS
100.0000 mg | ORAL_CAPSULE | Freq: Two times a day (BID) | ORAL | Status: DC
Start: 2015-02-08 — End: 2015-02-10
  Administered 2015-02-08 – 2015-02-10 (×4): 100 mg via ORAL
  Filled 2015-02-08 (×4): qty 1

## 2015-02-08 MED ORDER — ONDANSETRON HCL 4 MG/2ML IJ SOLN
4.0000 mg | INTRAMUSCULAR | Status: DC | PRN
  Administered 2015-02-08 – 2015-02-09 (×2): 4 mg via INTRAVENOUS
  Filled 2015-02-08: qty 2

## 2015-02-08 MED ORDER — EPHEDRINE SULFATE 50 MG/ML IJ SOLN
INTRAMUSCULAR | Status: DC | PRN
  Administered 2015-02-08: 15 mg via INTRAVENOUS
  Administered 2015-02-08 (×3): 10 mg via INTRAVENOUS

## 2015-02-08 MED ORDER — SUGAMMADEX SODIUM 500 MG/5ML IV SOLN
INTRAVENOUS | Status: AC
  Filled 2015-02-08: qty 5

## 2015-02-08 MED ORDER — GABAPENTIN 300 MG PO CAPS
300.0000 mg | ORAL_CAPSULE | Freq: Three times a day (TID) | ORAL | Status: DC
  Administered 2015-02-08 – 2015-02-10 (×5): 300 mg via ORAL
  Filled 2015-02-08 (×4): qty 1

## 2015-02-08 MED ORDER — MENTHOL 3 MG MT LOZG: 1.0000 | LOZENGE | OROMUCOSAL | Status: DC | PRN

## 2015-02-08 MED ORDER — PROPOFOL 10 MG/ML IV BOLUS
INTRAVENOUS | Status: AC
  Filled 2015-02-08: qty 20

## 2015-02-08 MED ORDER — METHOCARBAMOL 500 MG PO TABS
500.0000 mg | ORAL_TABLET | Freq: Four times a day (QID) | ORAL | Status: DC | PRN
  Administered 2015-02-08 – 2015-02-10 (×3): 500 mg via ORAL
  Filled 2015-02-08 (×2): qty 1

## 2015-02-08 MED ORDER — SODIUM CHLORIDE 0.9 % IV SOLN: 250.0000 mL | INTRAVENOUS | Status: DC

## 2015-02-08 MED ORDER — HYDROMORPHONE HCL 1 MG/ML IJ SOLN
0.5000 mg | INTRAMUSCULAR | Status: DC | PRN
  Administered 2015-02-08: 1 mg via INTRAVENOUS
  Filled 2015-02-08: qty 1

## 2015-02-08 MED ORDER — PROMETHAZINE HCL 25 MG/ML IJ SOLN: 6.2500 mg | INTRAMUSCULAR | Status: DC | PRN

## 2015-02-08 MED ORDER — PHENOL 1.4 % MT LIQD
1.0000 | OROMUCOSAL | Status: DC | PRN
Start: 2015-02-08 — End: 2015-02-10

## 2015-02-08 SURGICAL SUPPLY — 47 items
BUR ROUND FLUTED 4 SOFT TCH (BURR) ×2 IMPLANT
BUR ROUND FLUTED 4MM SOFT TCH (BURR) ×1
CLOSURE STERI-STRIP 1/2X4 (GAUZE/BANDAGES/DRESSINGS) ×1
CLOSURE WOUND 1/2 X4 (GAUZE/BANDAGES/DRESSINGS) ×1
CLSR STERI-STRIP ANTIMIC 1/2X4 (GAUZE/BANDAGES/DRESSINGS) ×2 IMPLANT
COVER SURGICAL LIGHT HANDLE (MISCELLANEOUS) ×3 IMPLANT
DERMABOND ADVANCED (GAUZE/BANDAGES/DRESSINGS) ×2
DERMABOND ADVANCED .7 DNX12 (GAUZE/BANDAGES/DRESSINGS) ×1 IMPLANT
DRAPE MICROSCOPE LEICA (MISCELLANEOUS) ×3 IMPLANT
DRAPE PROXIMA HALF (DRAPES) ×6 IMPLANT
DRSG MEPILEX BORDER 4X4 (GAUZE/BANDAGES/DRESSINGS) ×3 IMPLANT
DRSG MEPILEX BORDER 4X8 (GAUZE/BANDAGES/DRESSINGS) IMPLANT
DURAPREP 26ML APPLICATOR (WOUND CARE) ×3 IMPLANT
ELECT BLADE 4.0 EZ CLEAN MEGAD (MISCELLANEOUS) ×3
ELECT REM PT RETURN 9FT ADLT (ELECTROSURGICAL) ×3
ELECTRODE BLDE 4.0 EZ CLN MEGD (MISCELLANEOUS) ×1 IMPLANT
ELECTRODE REM PT RTRN 9FT ADLT (ELECTROSURGICAL) ×1 IMPLANT
GLOVE BIOGEL PI IND STRL 8 (GLOVE) ×3 IMPLANT
GLOVE BIOGEL PI INDICATOR 8 (GLOVE) ×6
GLOVE ORTHO TXT STRL SZ7.5 (GLOVE) ×9 IMPLANT
GOWN STRL REUS W/ TWL LRG LVL3 (GOWN DISPOSABLE) ×2 IMPLANT
GOWN STRL REUS W/ TWL XL LVL3 (GOWN DISPOSABLE) ×1 IMPLANT
GOWN STRL REUS W/TWL 2XL LVL3 (GOWN DISPOSABLE) ×3 IMPLANT
GOWN STRL REUS W/TWL LRG LVL3 (GOWN DISPOSABLE) ×4
GOWN STRL REUS W/TWL XL LVL3 (GOWN DISPOSABLE) ×2
KIT BASIN OR (CUSTOM PROCEDURE TRAY) ×3 IMPLANT
KIT ROOM TURNOVER OR (KITS) ×3 IMPLANT
MANIFOLD NEPTUNE II (INSTRUMENTS) ×3 IMPLANT
NEEDLE HYPO 25GX1X1/2 BEV (NEEDLE) ×3 IMPLANT
NEEDLE SPNL 18GX3.5 QUINCKE PK (NEEDLE) ×3 IMPLANT
NS IRRIG 1000ML POUR BTL (IV SOLUTION) ×3 IMPLANT
PACK LAMINECTOMY ORTHO (CUSTOM PROCEDURE TRAY) ×3 IMPLANT
PAD ARMBOARD 7.5X6 YLW CONV (MISCELLANEOUS) ×9 IMPLANT
PATTIES SURGICAL .5 X.5 (GAUZE/BANDAGES/DRESSINGS) IMPLANT
PATTIES SURGICAL .75X.75 (GAUZE/BANDAGES/DRESSINGS) IMPLANT
STRIP CLOSURE SKIN 1/2X4 (GAUZE/BANDAGES/DRESSINGS) ×2 IMPLANT
SUT BONE WAX W31G (SUTURE) IMPLANT
SUT VIC AB 0 CT1 27 (SUTURE) ×2
SUT VIC AB 0 CT1 27XBRD ANBCTR (SUTURE) ×1 IMPLANT
SUT VIC AB 1 CTX 36 (SUTURE) ×2
SUT VIC AB 1 CTX36XBRD ANBCTR (SUTURE) ×1 IMPLANT
SUT VIC AB 2-0 CT1 27 (SUTURE) ×2
SUT VIC AB 2-0 CT1 TAPERPNT 27 (SUTURE) ×1 IMPLANT
SUT VIC AB 3-0 X1 27 (SUTURE) IMPLANT
TOWEL OR 17X24 6PK STRL BLUE (TOWEL DISPOSABLE) ×3 IMPLANT
TOWEL OR 17X26 10 PK STRL BLUE (TOWEL DISPOSABLE) ×3 IMPLANT
WATER STERILE IRR 1000ML POUR (IV SOLUTION) ×3 IMPLANT

## 2015-02-08 NOTE — Interval H&P Note (Signed)
History and Physical Interval Note:  02/08/2015 12:36 PM  Lorraine Ryan  has presented today for surgery, with the diagnosis of L4-5 Stenosis  The various methods of treatment have been discussed with the patient and family. After consideration of risks, benefits and other options for treatment, the patient has consented to  Procedure(s): L4-5 Decompression (N/A) as a surgical intervention .  The patient's history has been reviewed, patient examined, no change in status, stable for surgery.  I have reviewed the patient's chart and labs.  Questions were answered to the patient's satisfaction.     Omere Marti C

## 2015-02-08 NOTE — Anesthesia Preprocedure Evaluation (Addendum)
Anesthesia Evaluation  Patient identified by MRN, date of birth, ID band Patient awake    Reviewed: Allergy & Precautions, NPO status , Patient's Chart, lab work & pertinent test results, reviewed documented beta blocker date and time   History of Anesthesia Complications Negative for: history of anesthetic complications  Airway Mallampati: II  TM Distance: >3 FB Neck ROM: Full    Dental  (+) Edentulous Upper, Partial Lower, Dental Advisory Given   Pulmonary former smoker,    breath sounds clear to auscultation       Cardiovascular hypertension, Pt. on home beta blockers  Rhythm:Regular Rate:Normal     Neuro/Psych Unilateral leg weakness CVA, Residual Symptoms negative psych ROS   GI/Hepatic negative GI ROS, Neg liver ROS,   Endo/Other  negative endocrine ROS  Renal/GU negative Renal ROS  negative genitourinary   Musculoskeletal negative musculoskeletal ROS (+)   Abdominal   Peds negative pediatric ROS (+)  Hematology negative hematology ROS (+)   Anesthesia Other Findings   Reproductive/Obstetrics negative OB ROS                         Lab Results  Component Value Date   WBC 4.3 02/08/2015   HGB 13.2 02/08/2015   HCT 40.5 02/08/2015   MCV 91.4 02/08/2015   PLT 207 02/08/2015   Lab Results  Component Value Date   CREATININE 0.80 02/08/2015   BUN 8 02/08/2015   NA 145 02/08/2015   K 3.5 02/08/2015   CL 109 02/08/2015   CO2 27 02/08/2015   Lab Results  Component Value Date   INR 1.07 02/08/2015   02/2015 EKG: normal sinus rhythm.    Anesthesia Physical Anesthesia Plan  ASA: III  Anesthesia Plan: General   Post-op Pain Management:    Induction: Intravenous  Airway Management Planned: Oral ETT  Additional Equipment:   Intra-op Plan:   Post-operative Plan: Extubation in OR  Informed Consent: I have reviewed the patients History and Physical, chart, labs and  discussed the procedure including the risks, benefits and alternatives for the proposed anesthesia with the patient or authorized representative who has indicated his/her understanding and acceptance.   Dental advisory given  Plan Discussed with: CRNA  Anesthesia Plan Comments: (Ms. Sandino complains of 2-3 years of chest pain (Stress Test, Echo & EKG - WNL) which improved with a "muscle relaxer". Per Ms. Chirico her chest discomfort is slightly better now and her PCP stated it was MSK related. She currently denies CP and any other active cardiac symptoms.   Anesthetic plan discussed in detail. Associated risk discussed including but not limited to life threatening cardiovascular, pulmonary events and dental damage. The postoperative pain management and antiemetic plan discussed with patient. All questions answered in detail. Patient is in agreement.   )        Anesthesia Quick Evaluation

## 2015-02-08 NOTE — Interval H&P Note (Signed)
History and Physical Interval Note:  02/08/2015 2:35 PM  Tinea Lutman  has presented today for surgery, with the diagnosis of L4-5 Stenosis  The various methods of treatment have been discussed with the patient and family. After consideration of risks, benefits and other options for treatment, the patient has consented to  Procedure(s): L4-5 Decompression (N/A) as a surgical intervention .  The patient's history has been reviewed, patient examined, no change in status, stable for surgery.  I have reviewed the patient's chart and labs.  Questions were answered to the patient's satisfaction.     YATES,MARK C

## 2015-02-08 NOTE — Brief Op Note (Signed)
02/08/2015  5:00 PM  PATIENT:  Lorraine Ryan  60 y.o. female  PRE-OPERATIVE DIAGNOSIS:  L4-5 Stenosis  POST-OPERATIVE DIAGNOSIS:  L4-5 Stenosis  PROCEDURE:  Procedure(s): L4-5 Decompression (N/A)  SURGEON:  Surgeon(s) and Role:    * Marybelle Killings, MD - Primary  PHYSICIAN ASSISTANT: Azara Gemme m. Ricard Dillon    ANESTHESIA:   general  EBL:  Total I/O In: 1000 [I.V.:1000] Out: 100 [Blood:100]  BLOOD ADMINISTERED:none  DRAINS: none   LOCAL MEDICATIONS USED:  MARCAINE     SPECIMEN:  No Specimen  DISPOSITION OF SPECIMEN:  N/A  COUNTS:  YES  TOURNIQUET:  * No tourniquets in log *  DICTATION: .Dragon Dictation  PLAN OF CARE: Admit for overnight observation  PATIENT DISPOSITION:  PACU - hemodynamically stable.

## 2015-02-08 NOTE — Transfer of Care (Signed)
Immediate Anesthesia Transfer of Care Note  Patient: Lorraine Ryan  Procedure(s) Performed: Procedure(s): L4-5 Decompression (N/A)  Patient Location: PACU  Anesthesia Type:General  Level of Consciousness: awake and oriented  Airway & Oxygen Therapy: Patient Spontanous Breathing and Patient connected to nasal cannula oxygen  Post-op Assessment: Report given to RN  Post vital signs: Reviewed and stable  Last Vitals:  Filed Vitals:   02/08/15 1147  BP: 124/79  Pulse: 74  Temp: 36.5 C  Resp: 18    Complications: No apparent anesthesia complications

## 2015-02-08 NOTE — Anesthesia Procedure Notes (Signed)
Procedure Name: Intubation Date/Time: 02/08/2015 3:33 PM Performed by: Barrington Ellison Pre-anesthesia Checklist: Patient identified, Emergency Drugs available, Suction available, Patient being monitored and Timeout performed Patient Re-evaluated:Patient Re-evaluated prior to inductionOxygen Delivery Method: Circle system utilized Preoxygenation: Pre-oxygenation with 100% oxygen Intubation Type: IV induction Ventilation: Mask ventilation without difficulty Laryngoscope Size: Mac and 3 Grade View: Grade I Tube type: Oral Tube size: 7.0 mm Number of attempts: 1 Airway Equipment and Method: Stylet Placement Confirmation: ETT inserted through vocal cords under direct vision,  positive ETCO2 and breath sounds checked- equal and bilateral Secured at: 21 cm Tube secured with: Tape Dental Injury: Teeth and Oropharynx as per pre-operative assessment

## 2015-02-09 ENCOUNTER — Encounter (HOSPITAL_COMMUNITY): Payer: Self-pay | Admitting: Orthopaedic Surgery

## 2015-02-09 DIAGNOSIS — I10 Essential (primary) hypertension: Secondary | ICD-10-CM | POA: Diagnosis not present

## 2015-02-09 DIAGNOSIS — M4806 Spinal stenosis, lumbar region: Secondary | ICD-10-CM | POA: Diagnosis not present

## 2015-02-09 DIAGNOSIS — E119 Type 2 diabetes mellitus without complications: Secondary | ICD-10-CM | POA: Diagnosis not present

## 2015-02-09 DIAGNOSIS — Z87891 Personal history of nicotine dependence: Secondary | ICD-10-CM | POA: Diagnosis not present

## 2015-02-09 DIAGNOSIS — Z8673 Personal history of transient ischemic attack (TIA), and cerebral infarction without residual deficits: Secondary | ICD-10-CM | POA: Diagnosis not present

## 2015-02-09 MED ORDER — HYDROCODONE-ACETAMINOPHEN 5-325 MG PO TABS: 2.0000 | ORAL_TABLET | Freq: Four times a day (QID) | ORAL | Status: DC | PRN

## 2015-02-09 NOTE — Progress Notes (Signed)
Subjective: 1 Day Post-Op Procedure(s) (LRB): L4-5 Decompression (N/A) Patient reports pain as mild and moderate.    Objective: Vital signs in last 24 hours: Temp:  [97.6 F (36.4 C)-98.1 F (36.7 C)] 98 F (36.7 C) (01/05 0534) Pulse Rate:  [60-84] 75 (01/05 0534) Resp:  [9-21] 16 (01/05 0534) BP: (91-158)/(51-84) 91/52 mmHg (01/05 0534) SpO2:  [92 %-100 %] 99 % (01/05 0534) Weight:  [121.564 kg (268 lb)] 121.564 kg (268 lb) (01/04 1147)  Intake/Output from previous day: 01/04 0701 - 01/05 0700 In: 1100 [I.V.:1100] Out: 450 [Urine:350; Blood:100] Intake/Output this shift:     Recent Labs  02/08/15 1303  HGB 13.2    Recent Labs  02/08/15 1303  WBC 4.3  RBC 4.43  HCT 40.5  PLT 207    Recent Labs  02/08/15 1303  NA 145  K 3.5  CL 109  CO2 27  BUN 8  CREATININE 0.80  GLUCOSE 82  CALCIUM 9.1    Recent Labs  02/08/15 1303  INR 1.07    Neurologically intact  Assessment/Plan: 1 Day Post-Op Procedure(s) (LRB): L4-5 Decompression (N/A) D/C IV fluids  D/c home today. Dressing change.   Yomayra Tate C 02/09/2015, 7:40 AM

## 2015-02-09 NOTE — Op Note (Signed)
Ryan, Lorraine             ACCOUNT NO.:  [id number]  MEDICAL RECORD NO.:  [id number]  LOCATION:  [facility]                         FACILITY:  [facility]  PHYSICIAN:  Eisa Necaise C. Lorin Mercy, M.D.    DATE OF BIRTH:  1955-03-06  DATE OF PROCEDURE:  02/08/2015 DATE OF DISCHARGE:                              OPERATIVE REPORT   PREOPERATIVE DIAGNOSIS:  L4-5 central stenosis.  POSTOPERATIVE DIAGNOSIS:  L4-5 central stenosis.  PROCEDURE:  L4-5 decompression.  SURGEON:  Janavia Rottman C. Lorin Mercy, M.D.  ANESTHESIA:  General.  ESTIMATED BLOOD LOSS:  Less than 100 mL.  DRAINS:  None.  ASSISTANT:  Benjiman Core, PA-C, medically necessary and present for the entire procedure.  DESCRIPTION OF PROCEDURE:  After induction of general anesthesia, the patient was placed prone. She had stenosis at L4-5 with primarily ligamentous hypertrophy and some facet overhang.  3 g Ancef was given. Back was prepped with DuraPrep, squared with towels, Betadine, Steri- Drape applied.  Laminectomy sheets and drape, spinal needle placed at expected level, confirmed with cross-table lateral x-ray, the needle was at L4.  Incision was started at the level, needle extended caudally. After time-out procedure, subperiosteal dissection,  2 cerebellar retractors were placed and the Boss McCullough retractor had to be used for the extra deep longest blade at 90 mm due to the patient's truncal obesity and 121 kg weight with short status.  Subperiosteal dissection on the lamina.  Kocher clamps were placed, confirmed with cross-table lateral x-ray, for planned area of decompression.  The bone was immediately marked and inferior half of the spinous process was resected, inferior half the lamina on the right and left L4 was removed. Then with the Kerrison, then with the bur.  There were greater than a centimeter thickness of hypertrophic ligamentum which had to be resected in chunks to expose the dura.  Once the ligament was removed on the side,  the dura tube rounded up.  We were dissected at the level of the disc and patient had isolated stenosis at the level of the disc due to hypertrophic ligament which was resected.  Lateral gutters were cleaned of ligament overhanging spurs, foramina was enlarged at the lateral recess as the nerve root, one underneath the pedicle edge.  Palpation caudally cephalad showed no areas of remaining compression.  Copious irrigation, operative microscope was removed.  It was used for visualization light as well as magnification due to the __________ body habitus.  Closure with #1 Vicryl, 2-0 Vicryl subcutaneous tissue, subcuticular closure, Dermabond in the skin.  Postop dressing and transferred to recovery in stable condition.  Instrument count and needle count was correct.Elta Guadeloupe C. Lorin Mercy, M.D.     MCY/MEDQ  D:  02/08/2015  T:  02/08/2015  Job:  [id number]

## 2015-02-09 NOTE — Care Management Obs Status (Signed)
[place] NOTIFICATION   Patient Details  Name: Etna Widner MRN: [id number] Date of Birth: [birth year]   Medicare Observation Status Notification Given:  Yes    Nila Nephew, RN 02/09/2015, 2:59 PM

## 2015-02-10 DIAGNOSIS — I10 Essential (primary) hypertension: Secondary | ICD-10-CM | POA: Diagnosis not present

## 2015-02-10 DIAGNOSIS — M4806 Spinal stenosis, lumbar region: Secondary | ICD-10-CM | POA: Diagnosis not present

## 2015-02-10 DIAGNOSIS — E119 Type 2 diabetes mellitus without complications: Secondary | ICD-10-CM | POA: Diagnosis not present

## 2015-02-10 DIAGNOSIS — Z87891 Personal history of nicotine dependence: Secondary | ICD-10-CM | POA: Diagnosis not present

## 2015-02-10 DIAGNOSIS — Z8673 Personal history of transient ischemic attack (TIA), and cerebral infarction without residual deficits: Secondary | ICD-10-CM | POA: Diagnosis not present

## 2015-02-10 NOTE — Op Note (Signed)
NAMESTEPHANINE, Lorraine Ryan             ACCOUNT NO.:  [id number]  MEDICAL RECORD NO.:  [id number]  LOCATION:  [id number]                        FACILITY:  [facility]  PHYSICIAN:  Freyja Govea C. Lorin Mercy, M.D.    DATE OF BIRTH:  [birth year]  DATE OF PROCEDURE:  02/08/2015 DATE OF DISCHARGE:                              OPERATIVE REPORT   PREOPERATIVE DIAGNOSIS:  L4-5 stenosis.  POSTOPERATIVE DIAGNOSIS:  L4-5 stenosis.  PROCEDURE:  L4-5 decompression.  SURGEON:  Tyrianna Lightle C. Lorin Mercy, MD  ASSISTANT:  Alyson Locket. Ricard Dillon, PA-C  ANESTHESIA:  Dictation ended at this point.  THIS DICTATION IS ALREADY DONE AND IN THE CHART   Jaleia Hanke C. Lorin Mercy, M.D.     MCY/MEDQ  D:  02/09/2015  T:  02/09/2015  Job:  [id number]

## 2015-02-14 NOTE — Anesthesia Postprocedure Evaluation (Signed)
Anesthesia Post Note  Patient: Lorraine Ryan  Procedure(s) Performed: Procedure(s) (LRB): L4-5 Decompression (N/A)  Patient location during evaluation: PACU Anesthesia Type: General Level of consciousness: awake and alert Pain management: pain level controlled Vital Signs Assessment: post-procedure vital signs reviewed and stable Respiratory status: spontaneous breathing, nonlabored ventilation, respiratory function stable and patient connected to nasal cannula oxygen Cardiovascular status: blood pressure returned to baseline and stable Postop Assessment: no signs of nausea or vomiting Anesthetic complications: no    Last Vitals:  Filed Vitals:   02/09/15 2125 02/10/15 0515  BP: 116/60 115/60  Pulse: 90 86  Temp: 37.6 C 37.6 C  Resp: 16 16    Last Pain:  Filed Vitals:   02/10/15 1341  PainSc: 6                  Effie Berkshire

## 2015-02-16 DIAGNOSIS — M9903 Segmental and somatic dysfunction of lumbar region: Secondary | ICD-10-CM | POA: Diagnosis not present

## 2015-02-16 DIAGNOSIS — M9905 Segmental and somatic dysfunction of pelvic region: Secondary | ICD-10-CM | POA: Diagnosis not present

## 2015-02-16 DIAGNOSIS — M461 Sacroiliitis, not elsewhere classified: Secondary | ICD-10-CM | POA: Diagnosis not present

## 2015-02-16 DIAGNOSIS — M545 Low back pain: Secondary | ICD-10-CM | POA: Diagnosis not present

## 2015-02-16 DIAGNOSIS — M9904 Segmental and somatic dysfunction of sacral region: Secondary | ICD-10-CM | POA: Diagnosis not present

## 2015-02-16 DIAGNOSIS — M5388 Other specified dorsopathies, sacral and sacrococcygeal region: Secondary | ICD-10-CM | POA: Diagnosis not present

## 2015-02-20 NOTE — Discharge Summary (Signed)
Patient ID: Lorraine Ryan MRN: [id number] DOB/AGE: [birth year] 60 y.o.  Admit date: 02/08/2015 Discharge date: 02/20/2015  Admission Diagnoses:  Active Problems:   History of lumbar laminectomy for spinal cord decompression   Discharge Diagnoses:  Active Problems:   History of lumbar laminectomy for spinal cord decompression  status post Procedure(s): L4-5 Decompression  Past Medical History  Diagnosis Date  . Hypertension     No meds prescribed; states intermittent  . Back pain   . Stroke [facility])     caused numbness in leg    Surgeries: Procedure(s): L4-5 Decompression on 02/08/2015   Consultants:    Discharged Condition: Improved  Hospital Course: Lorraine Ryan is an 60 y.o. female who was admitted 02/08/2015 for operative treatment of lumbar stenosis.  Patient failed conservative treatments (please see the history and physical for the specifics) and had severe unremitting pain that affects sleep, daily activities and work/hobbies. After pre-op clearance, the patient was taken to the operating room on 02/08/2015 and underwent  Procedure(s): L4-5 Decompression.    Patient was given perioperative antibiotics:  Anti-infectives    Start     Dose/Rate Route Frequency Ordered Stop   02/08/15 2200  ceFAZolin (ANCEF) IVPB 1 g/50 mL premix     1 g 100 mL/hr over 30 Minutes Intravenous 3 times per day 02/08/15 2004 02/09/15 0623   02/08/15 1430  ceFAZolin (ANCEF) 3 g in dextrose 5 % 50 mL IVPB     3 g 160 mL/hr over 30 Minutes Intravenous To [facility] 02/07/15 0928 02/08/15 1534       Patient was given sequential compression devices and early ambulation to prevent DVT.   Patient benefited maximally from hospital stay and there were no complications. At the time of discharge, the patient was urinating/moving their bowels without difficulty, tolerating a regular diet, pain is controlled with oral pain medications and they have been cleared by PT/OT.   Recent vital  signs: No data found.    Recent laboratory studies: No results for input(s): WBC, HGB, HCT, PLT, NA, K, CL, CO2, BUN, CREATININE, GLUCOSE, INR, CALCIUM in the last 72 hours.  Invalid input(s): PT, 2   Discharge Medications:     Medication List    STOP taking these medications        oxyCODONE-acetaminophen 5-325 MG tablet  Commonly known as:  PERCOCET      TAKE these medications        aspirin EC 81 MG tablet  Take 1 tablet (81 mg total) by mouth daily.     atenolol 25 MG tablet  Commonly known as:  TENORMIN  Take 1 tablet (25 mg total) by mouth daily.     baclofen 10 MG tablet  Commonly known as:  LIORESAL  Take 1 tablet (10 mg total) by mouth at bedtime.     BC HEADACHE PO  Take 1 packet by mouth every 6 (six) hours as needed (for headache or pain).     buPROPion 150 MG 24 hr tablet  Commonly known as:  WELLBUTRIN XL  Take 150 mg by mouth daily.     colchicine 0.6 MG tablet  Take 1 tablet (0.6 mg total) by mouth 2 (two) times daily as needed.     DULoxetine 30 MG capsule  Commonly known as:  CYMBALTA  Take 30 mg by mouth daily.     gabapentin 300 MG capsule  Commonly known as:  NEURONTIN  Take 1 capsule (300 mg total) by mouth 3 (three)  times daily.     HYDROcodone-acetaminophen 5-325 MG tablet  Commonly known as:  NORCO/VICODIN  Take 1 tablet by mouth every 6 (six) hours as needed (pain).     HYDROcodone-acetaminophen 5-325 MG tablet  Commonly known as:  NORCO  Take 2 tablets by mouth every 6 (six) hours as needed for moderate pain.     LATUDA 20 MG Tabs tablet  Generic drug:  lurasidone  Take 20 mg by mouth daily with breakfast.     magnesium hydroxide 400 MG/5ML suspension  Commonly known as:  MILK OF MAGNESIA  Take 15 mLs by mouth at bedtime as needed for mild constipation.     omeprazole 20 MG capsule  Commonly known as:  PRILOSEC  Take 1 capsule (20 mg total) by mouth daily.     ondansetron 8 MG tablet  Commonly known as:  ZOFRAN  Take 1  tablet (8 mg total) by mouth every 8 (eight) hours as needed for nausea or vomiting.     traZODone 100 MG tablet  Commonly known as:  DESYREL  Take 100 mg by mouth at bedtime.     Vitamin D (Ergocalciferol) 50000 units Caps capsule  Commonly known as:  DRISDOL  Take 1 capsule (50,000 Units total) by mouth every 7 (seven) days.        Diagnostic Studies: Dg Lumbar Spine 2-3 Views  02/08/2015  CLINICAL DATA:  L4-L5 decompression EXAM: LUMBAR SPINE - 2-3 VIEW COMPARISON:  CT 07/12/2014 FINDINGS: Three intraoperative lateral views of lumbar spine are provided. Initial view demonstrates a sharp tip probe at the level of the L4 spinous process. Second image demonstrates blunt tip probes at the spinous process of L4 and L5. Third image demonstrates blunt tip probe at the spinous processes of L4 and L5. IMPRESSION: Intraoperative views as above. Electronically Signed   By: Suzy Bouchard M.D.   On: 02/08/2015 18:13        Discharge Plan:  discharge to home  Disposition:     Signed: Lanae Crumbly 02/20/2015, 9:25 AM

## 2015-03-01 DIAGNOSIS — M549 Dorsalgia, unspecified: Secondary | ICD-10-CM | POA: Diagnosis not present

## 2015-03-01 DIAGNOSIS — K219 Gastro-esophageal reflux disease without esophagitis: Secondary | ICD-10-CM | POA: Diagnosis not present

## 2015-03-01 DIAGNOSIS — Z6841 Body Mass Index (BMI) 40.0 and over, adult: Secondary | ICD-10-CM | POA: Diagnosis not present

## 2015-03-01 DIAGNOSIS — F99 Mental disorder, not otherwise specified: Secondary | ICD-10-CM | POA: Diagnosis not present

## 2015-03-02 DIAGNOSIS — F331 Major depressive disorder, recurrent, moderate: Secondary | ICD-10-CM | POA: Diagnosis not present

## 2015-03-02 MED FILL — ?OMEPRAZOLE DR 20 MG CAPSUL: 20 | 30 days supply | Qty: 30 | Fill #3

## 2015-03-02 MED FILL — GABAPENTIN 300 MG CAPSULE: 300 | 30 days supply | Qty: 90 | Fill #2

## 2015-03-02 MED FILL — BACLOFEN 10 MG TABLET: 10 | 30 days supply | Qty: 30 | Fill #3

## 2015-03-06 MED FILL — ?ATENOLOL 25 MG TABLET: 25 | 30 days supply | Qty: 30 | Fill #2

## 2015-03-08 ENCOUNTER — Ambulatory Visit: Payer: Medicare Other | Attending: Internal Medicine | Admitting: Internal Medicine

## 2015-03-08 ENCOUNTER — Encounter: Payer: Self-pay | Admitting: Internal Medicine

## 2015-03-08 VITALS — BP 122/80 | HR 82 | Temp 98.0°F | Resp 16 | Ht 66.0 in | Wt 263.0 lb

## 2015-03-08 DIAGNOSIS — Z888 Allergy status to other drugs, medicaments and biological substances status: Secondary | ICD-10-CM | POA: Insufficient documentation

## 2015-03-08 DIAGNOSIS — R197 Diarrhea, unspecified: Secondary | ICD-10-CM | POA: Insufficient documentation

## 2015-03-08 DIAGNOSIS — Z79899 Other long term (current) drug therapy: Secondary | ICD-10-CM | POA: Diagnosis not present

## 2015-03-08 DIAGNOSIS — I10 Essential (primary) hypertension: Secondary | ICD-10-CM | POA: Diagnosis not present

## 2015-03-08 DIAGNOSIS — Z8673 Personal history of transient ischemic attack (TIA), and cerebral infarction without residual deficits: Secondary | ICD-10-CM | POA: Insufficient documentation

## 2015-03-08 DIAGNOSIS — Z7982 Long term (current) use of aspirin: Secondary | ICD-10-CM | POA: Diagnosis not present

## 2015-03-08 DIAGNOSIS — M549 Dorsalgia, unspecified: Secondary | ICD-10-CM | POA: Diagnosis not present

## 2015-03-08 DIAGNOSIS — R112 Nausea with vomiting, unspecified: Secondary | ICD-10-CM | POA: Insufficient documentation

## 2015-03-08 DIAGNOSIS — R110 Nausea: Secondary | ICD-10-CM | POA: Insufficient documentation

## 2015-03-08 DIAGNOSIS — M545 Low back pain: Secondary | ICD-10-CM | POA: Diagnosis not present

## 2015-03-08 DIAGNOSIS — Z87891 Personal history of nicotine dependence: Secondary | ICD-10-CM | POA: Insufficient documentation

## 2015-03-08 MED ORDER — ONDANSETRON HCL 4 MG PO TABS
4.0000 mg | ORAL_TABLET | Freq: Once | ORAL | Status: AC
  Administered 2015-03-08: 4 mg via ORAL

## 2015-03-08 NOTE — Progress Notes (Signed)
Patient complains of back pain and right leg pain Patient also states she has been vomiting on and off for the past month

## 2015-03-08 NOTE — Progress Notes (Signed)
Patient ID: Lorraine Ryan, female   DOB: [birth year], 60 y.o.   MRN: [id number]  CC: back pain, nausea  HPI: Lorraine Ryan is a 60 y.o. female here today for a follow up visit.  Patient has past medical history of hypertension, back pain, and prior stroke. Patient was recently admitted into hospital on 02/08/2015 for a lumbar decompression surgery. She has not returned to her surgeon because she wants a new doctor. Today she states that she is having the same lower back pain that she was before surgery. Pain is bilateral and is sharp that causes her right leg to hurt. She denies bowel or bladder dysfunction. She has been using Norco for pain.  She also c/o of nausea for one month. Every morning she has nausea. States that she was diagnosed by [facility] the end of December because she was having similar symptoms or nausea and diarrhea. Diarrhea has now improved. She is unsure if nausea is related to Norco since she has been on it around the same time.   Allergies  Allergen Reactions  . Codeine Other (See Comments)    Headache  . Oxycodone Nausea Only and Palpitations  . Prednisone Other (See Comments)    Headache  . Tramadol Nausea Only   Past Medical History  Diagnosis Date  . Hypertension     No meds prescribed; states intermittent  . Back pain   . Stroke [facility])     caused numbness in leg   Current Outpatient Prescriptions on File Prior to Visit  Medication Sig Dispense Refill  . aspirin EC 81 MG tablet Take 1 tablet (81 mg total) by mouth daily. 90 tablet 3  . Aspirin-Salicylamide-Caffeine (BC HEADACHE PO) Take 1 packet by mouth every 6 (six) hours as needed (for headache or pain).    Marland Kitchen atenolol (TENORMIN) 25 MG tablet Take 1 tablet (25 mg total) by mouth daily. 30 tablet 3  . baclofen (LIORESAL) 10 MG tablet Take 1 tablet (10 mg total) by mouth at bedtime. 30 each 3  . buPROPion (WELLBUTRIN XL) 150 MG 24 hr tablet Take 150 mg by mouth daily.    . DULoxetine (CYMBALTA) 30 MG  capsule Take 30 mg by mouth daily.    Marland Kitchen gabapentin (NEURONTIN) 300 MG capsule Take 1 capsule (300 mg total) by mouth 3 (three) times daily. 90 capsule 3  . HYDROcodone-acetaminophen (NORCO) 5-325 MG tablet Take 2 tablets by mouth every 6 (six) hours as needed for moderate pain. 50 tablet 0  . lurasidone (LATUDA) 20 MG TABS tablet Take 20 mg by mouth daily with breakfast.    . omeprazole (PRILOSEC) 20 MG capsule Take 1 capsule (20 mg total) by mouth daily. 30 capsule 3  . ondansetron (ZOFRAN) 8 MG tablet Take 1 tablet (8 mg total) by mouth every 8 (eight) hours as needed for nausea or vomiting. (Patient taking differently: Take 8 mg by mouth every 6 (six) hours as needed for nausea or vomiting. ) 12 tablet 0  . traZODone (DESYREL) 100 MG tablet Take 100 mg by mouth at bedtime.    . colchicine 0.6 MG tablet Take 1 tablet (0.6 mg total) by mouth 2 (two) times daily as needed. (Patient not taking: Reported on 02/08/2015) 60 tablet 0  . HYDROcodone-acetaminophen (NORCO/VICODIN) 5-325 MG tablet Take 1 tablet by mouth every 6 (six) hours as needed (pain).    . magnesium hydroxide (MILK OF MAGNESIA) 400 MG/5ML suspension Take 15 mLs by mouth at bedtime as needed for mild constipation.  360 mL 0  . Vitamin D, Ergocalciferol, (DRISDOL) 50000 UNITS CAPS capsule Take 1 capsule (50,000 Units total) by mouth every 7 (seven) days. (Patient not taking: Reported on 02/08/2015) 12 capsule 0   No current facility-administered medications on file prior to visit.   Family History  Problem Relation Age of Onset  . Heart disease      No family history  . Diabetes Mother   . Hypertension Mother    Social History   Social History  . Marital Status: Divorced    Spouse Name: N/A  . Number of Children: 5  . Years of Education: N/A   Occupational History  . Not on file.   Social History Main Topics  . Smoking status: Former Research scientist (life sciences)  . Smokeless tobacco: Never Used  . Alcohol Use: No  . Drug Use: No  . Sexual  Activity: Not on file   Other Topics Concern  . Not on file   Social History Narrative    Review of Systems: Other than what is stated in HPI, all other systems are negative.   Objective:   Filed Vitals:   03/08/15 0924  BP: 122/80  Pulse: 82  Temp: 98 F (36.7 C)  Resp: 16    Physical Exam  Constitutional: She is oriented to person, place, and time.  Cardiovascular: Normal rate, regular rhythm and normal heart sounds.   Pulmonary/Chest: Effort normal and breath sounds normal.  Abdominal: There is no tenderness.  Musculoskeletal: Normal range of motion. She exhibits no tenderness.  Neurological: She is alert and oriented to person, place, and time.  Skin: Skin is warm and dry.     Lab Results  Component Value Date   WBC 4.3 02/08/2015   HGB 13.2 02/08/2015   HCT 40.5 02/08/2015   MCV 91.4 02/08/2015   PLT 207 02/08/2015   Lab Results  Component Value Date   CREATININE 0.80 02/08/2015   BUN 8 02/08/2015   NA 145 02/08/2015   K 3.5 02/08/2015   CL 109 02/08/2015   CO2 27 02/08/2015    Lab Results  Component Value Date   HGBA1C 5.70 03/07/2014   Lipid Panel     Component Value Date/Time   CHOL 250* 03/07/2014 1005   TRIG 84 03/07/2014 1005   HDL 66 03/07/2014 1005   CHOLHDL 3.8 03/07/2014 1005   VLDL 17 03/07/2014 1005   LDLCALC 167* 03/07/2014 1005       Assessment and plan:   Lorraine Ryan was seen today for back pain.  Diagnoses and all orders for this visit:  Bilateral low back pain, with sciatica presence unspecified I have explained to patient that she needs to schedule a follow up with the same provider who did her surgery so they can make sure things are going well.  Weight loss highly advised  Nausea and vomiting, intractability of vomiting not specified, unspecified vomiting type -     ondansetron (ZOFRAN) tablet 4 mg; Take 1 tablet (4 mg total) by mouth once. Believe nausea is a result of Norco. Patient can only have Tramadol or  tylenol 3 which are both on her allergy list. I have explained that she may stop the medication until seen by her surgeon to find a replacement to help with nausea.  No Follow-up on file.       Lance Bosch, [place] [facility] [phone] 03/08/2015, 9:56 AM

## 2015-03-14 ENCOUNTER — Telehealth: Payer: Self-pay | Admitting: Internal Medicine

## 2015-03-14 DIAGNOSIS — R109 Unspecified abdominal pain: Secondary | ICD-10-CM

## 2015-03-14 NOTE — Telephone Encounter (Signed)
Pt new a new gi referral dx Abdominal pain to go to [place] Thank you

## 2015-03-15 ENCOUNTER — Encounter: Payer: Self-pay | Admitting: Gastroenterology

## 2015-03-23 DIAGNOSIS — F331 Major depressive disorder, recurrent, moderate: Secondary | ICD-10-CM | POA: Diagnosis not present

## 2015-03-23 DIAGNOSIS — F323 Major depressive disorder, single episode, severe with psychotic features: Secondary | ICD-10-CM | POA: Diagnosis not present

## 2015-03-30 ENCOUNTER — Other Ambulatory Visit: Payer: Self-pay

## 2015-03-30 ENCOUNTER — Ambulatory Visit (INDEPENDENT_AMBULATORY_CARE_PROVIDER_SITE_OTHER): Payer: Medicare Other | Admitting: Nurse Practitioner

## 2015-03-30 ENCOUNTER — Encounter: Payer: Self-pay | Admitting: Nurse Practitioner

## 2015-03-30 VITALS — BP 144/88 | HR 67 | Temp 98.3°F | Ht 66.0 in | Wt 259.0 lb

## 2015-03-30 DIAGNOSIS — Z1211 Encounter for screening for malignant neoplasm of colon: Secondary | ICD-10-CM | POA: Diagnosis not present

## 2015-03-30 DIAGNOSIS — R1310 Dysphagia, unspecified: Secondary | ICD-10-CM | POA: Insufficient documentation

## 2015-03-30 DIAGNOSIS — R1013 Epigastric pain: Secondary | ICD-10-CM | POA: Insufficient documentation

## 2015-03-30 MED ORDER — NA SULFATE-K SULFATE-MG SULF 17.5-3.13-1.6 GM/177ML PO SOLN
1.0000 | Freq: Once | ORAL | Status: DC
Start: 2015-03-30 — End: 2015-04-18

## 2015-03-30 MED ORDER — OMEPRAZOLE 20 MG PO CPDR: 20.0000 mg | DELAYED_RELEASE_CAPSULE | Freq: Two times a day (BID) | ORAL | Status: DC

## 2015-03-30 MED FILL — OMEPRAZOLE DR 20 MG CAPSULE: 20 | 30 days supply | Qty: 60 | Fill #0

## 2015-03-30 MED FILL — SUPREP BOWEL PREP KIT: 17.5-3.13-1 | 1 days supply | Qty: 354 | Fill #0

## 2015-03-30 MED FILL — GABAPENTIN 300 MG CAPSULE: 300 | 30 days supply | Qty: 90 | Fill #3

## 2015-03-30 MED FILL — ?ATENOLOL 25 MG TABLET: 25 | 30 days supply | Qty: 30 | Fill #3

## 2015-03-30 NOTE — Progress Notes (Addendum)
Primary Care Physician:  Lance Bosch, NP Primary Gastroenterologist:  Dr. Oneida Alar  Chief Complaint  Patient presents with  . Abdominal Pain  . Emesis    HPI:   Lorraine Ryan is a 60 y.o. female who presents on referral from primary care for abdominal pain. Appears to have last been seen by internal medicine on 11/28/2014 at which point she was diagnosed with GERD. She was given Prilosec 20 mg once a day, discussed diet and weight and exacerbating factors. Given ER precautions as well. Return visit for follow-up on her abdominal pain the patient left without being seen. No history of colonoscopy or endoscopy in our system.  Today she states she's having epigastric pain for a few months. Also with nausea and vomiting. Last episode of emesis yesterday, denies hematemesis. Also with esophageal burning. Pain occasionally worse after eating. Occasional solid food dysphagia. Takes 4 BC powders daily for headaches. Also takes Advil every few days. Occasional pil dysphagia. Denies regurgitation, typically passes with time and fluids. Denies hematochezia, melena, fever, chills, unintentional weight loss. Has some constipation chronically. Not currently taking anything for hard stool. Bowel movements typically daily, requires straining. Denies chest pain, dyspnea, dizziness, lightheadedness, syncope, near syncope. Denies any other upper or lower GI symptoms.  States she has never had a colonoscopy before.  Past Medical History  Diagnosis Date  . Hypertension     No meds prescribed; states intermittent  . Back pain   . Stroke [facility])     caused numbness in leg    Past Surgical History  Procedure Laterality Date  . Ganglion cyst excision    . Abdominal hysterectomy    . Tonsillectomy    . Lumbar laminectomy/decompression microdiscectomy N/A 02/08/2015    Procedure: L4-5 Decompression;  Surgeon: Marybelle Killings, MD;  Location: [facility];  Service: Orthopedics;  Laterality: N/A;    Current  Outpatient Prescriptions  Medication Sig Dispense Refill  . aspirin EC 81 MG tablet Take 1 tablet (81 mg total) by mouth daily. 90 tablet 3  . Aspirin-Salicylamide-Caffeine (BC HEADACHE PO) Take 1 packet by mouth every 6 (six) hours as needed (for headache or pain).    Marland Kitchen atenolol (TENORMIN) 25 MG tablet Take 1 tablet (25 mg total) by mouth daily. 30 tablet 3  . baclofen (LIORESAL) 10 MG tablet Take 1 tablet (10 mg total) by mouth at bedtime. 30 each 3  . buPROPion (WELLBUTRIN XL) 150 MG 24 hr tablet Take 150 mg by mouth daily.    . colchicine 0.6 MG tablet Take 1 tablet (0.6 mg total) by mouth 2 (two) times daily as needed. 60 tablet 0  . DULoxetine (CYMBALTA) 30 MG capsule Take 30 mg by mouth daily.    Marland Kitchen gabapentin (NEURONTIN) 300 MG capsule Take 1 capsule (300 mg total) by mouth 3 (three) times daily. 90 capsule 3  . HYDROcodone-acetaminophen (NORCO) 5-325 MG tablet Take 2 tablets by mouth every 6 (six) hours as needed for moderate pain. 50 tablet 0  . HYDROcodone-acetaminophen (NORCO/VICODIN) 5-325 MG tablet Take 1 tablet by mouth every 6 (six) hours as needed (pain).    Marland Kitchen lurasidone (LATUDA) 20 MG TABS tablet Take 20 mg by mouth daily with breakfast.    . magnesium hydroxide (MILK OF MAGNESIA) 400 MG/5ML suspension Take 15 mLs by mouth at bedtime as needed for mild constipation. 360 mL 0  . omeprazole (PRILOSEC) 20 MG capsule Take 1 capsule (20 mg total) by mouth daily. 30 capsule 3  .  ondansetron (ZOFRAN) 8 MG tablet Take 1 tablet (8 mg total) by mouth every 8 (eight) hours as needed for nausea or vomiting. (Patient taking differently: Take 8 mg by mouth every 6 (six) hours as needed for nausea or vomiting. ) 12 tablet 0  . traZODone (DESYREL) 100 MG tablet Take 100 mg by mouth at bedtime.    . Vitamin D, Ergocalciferol, (DRISDOL) 50000 UNITS CAPS capsule Take 1 capsule (50,000 Units total) by mouth every 7 (seven) days. 12 capsule 0   No current facility-administered medications for this  visit.    Allergies as of 03/30/2015 - Review Complete 03/30/2015  Allergen Reaction Noted  . Codeine Other (See Comments) 12/05/2012  . Oxycodone Nausea Only and Palpitations 11/28/2012  . Prednisone Other (See Comments) 11/28/2012  . Tramadol Nausea Only 03/06/2013    Family History  Problem Relation Age of Onset  . Heart disease      No family history  . Diabetes Mother   . Hypertension Mother     Social History   Social History  . Marital Status: Divorced    Spouse Name: N/A  . Number of Children: 5  . Years of Education: N/A   Occupational History  . Not on file.   Social History Main Topics  . Smoking status: Former Research scientist (life sciences)  . Smokeless tobacco: Never Used  . Alcohol Use: No  . Drug Use: No  . Sexual Activity: Not on file   Other Topics Concern  . Not on file   Social History Narrative    Review of Systems: 10-point ROS negative except as per HPI.    Physical Exam: BP 144/88 mmHg  Pulse 67  Temp(Src) 98.3 F (36.8 C) (Oral)  Ht 5\' 6"  (1.676 m)  Wt 259 lb (117.482 kg)  BMI 41.82 kg/m2 General:   Alert and oriented. Pleasant and cooperative. Well-nourished and well-developed.  Head:  Normocephalic and atraumatic. Eyes:  Without icterus, sclera clear and conjunctiva pink.  Cardiovascular:  S1, S2 present without murmurs appreciated. Extremities without clubbing or edema. Respiratory:  Clear to auscultation bilaterally. No wheezes, rales, or rhonchi. No distress.  Gastrointestinal:  +BS, soft, and non-distended. Mild epigastric TTP. No HSM noted. No guarding or rebound. No masses appreciated.  Rectal:  Deferred  Musculoskalatal:  Symmetrical without gross deformities. Normal posture. Skin:  Intact without significant lesions or rashes. Neurologic:  Alert and oriented x4;  grossly normal neurologically. Some flattened affect, history of CVA. Psych:  Alert and cooperative. Normal mood and affect. Heme/Lymph/Immune: No excessive bruising  noted.    03/30/2015 10:45 AM

## 2015-03-30 NOTE — Progress Notes (Signed)
CC'D TO PCP °

## 2015-03-30 NOTE — Assessment & Plan Note (Addendum)
The patient is a 60 year old African-American female with no prior colonoscopy. She is much overdue at this time. Generally asymptomatic from a lower GI standpoint. Given the need for endoscopy and initial screening colonoscopy we will proceed with a colonoscopy at this time, in addition to her endoscopy as noted above.  Proceed with colonoscopy with Dr. Oneida Alar in the OR on propofol/MAC in the near future. The risks, benefits, and alternatives have been discussed in detail with the patient. They state understanding and desire to proceed.   The patient is currently on trazodone, hydrocodone, Neurontin, Cymbalta, Wellbutrin. We'll proceed with the procedure in the OR on propofol/MAC due to polypharmacy in order to ensure adequate sedation.

## 2015-03-30 NOTE — Patient Instructions (Signed)
1. I sent in a prescription to your pharmacy for a refill on Prilosec. 2. Take Prilosec twice a day, once in the morning and once in the evening. Take it 30 minutes prior to eating. 3. Avoid taking BC powders and Advil. 4. We'll schedule your procedure for you. 5. Further recommendations to be based on results of your procedure. Next line return for follow-up in 2 months

## 2015-03-30 NOTE — Assessment & Plan Note (Signed)
Patient with occasional solid food dysphagia. No regurgitation. Typically passes with time and fluids. Recently diagnosed with GERD and placed on Prilosec once a day. Possible esophagitis/GERD dysphagia. We'll proceed with endoscopy as noted below.

## 2015-03-30 NOTE — Assessment & Plan Note (Signed)
Recently diagnosed with GERD and placed on Prilosec once daily. Persistent epigastric pain. Takes 4 BC powders a day for chronic headaches, Advil every few days when [facility] powders ineffective for headaches. Concerning for possible developing gastric ulcer. Today I'll increase her Prilosec to twice a day, recommend avoid aspirin powders and all NSAIDs. We will schedule for an endoscopy to further evaluate.  Proceed with EGD with Dr. Oneida Alar in the OR on propofol/MAC in near future: the risks, benefits, and alternatives have been discussed with the patient in detail. The patient states understanding and desires to proceed.  The patient is currently on trazodone, hydrocodone, Neurontin, Cymbalta, Wellbutrin. We'll proceed with the procedure in the OR on propofol/MAC due to polypharmacy in order to ensure adequate sedation.

## 2015-04-12 NOTE — Patient Instructions (Signed)
Lorraine Ryan  04/12/2015     @PREFPERIOPPHARMACY @   Your procedure is scheduled on 04/17/2015.  Report to [facility] at 7:10 A.M.  Call this number if you have problems the morning of surgery:  [phone]   Remember:  Do not eat food or drink liquids after midnight.  Take these medicines the morning of surgery with A SIP OF WATER Albuterol inhaler, Symbicort, Oxycodone if needed   Do not wear jewelry, make-up or nail polish.  Do not wear lotions, powders, or perfumes.  You may wear deodorant.  Do not shave 48 hours prior to surgery.  Men may shave face and neck.  Do not bring valuables to the hospital.  [facility] is not responsible for any belongings or valuables.  Contacts, dentures or bridgework may not be worn into surgery.  Leave your suitcase in the car.  After surgery it may be brought to your room.  For patients admitted to the hospital, discharge time will be determined by your treatment team.  Patients discharged the day of surgery will not be allowed to drive home.    Please read over the following fact sheets that you were given. Anesthesia Post-op Instructions    PATIENT INSTRUCTIONS POST-ANESTHESIA  IMMEDIATELY FOLLOWING SURGERY:  Do not drive or operate machinery for the first twenty four hours after surgery.  Do not make any important decisions for twenty four hours after surgery or while taking narcotic pain medications or sedatives.  If you develop intractable nausea and vomiting or a severe headache please notify your doctor immediately.  FOLLOW-UP:  Please make an appointment with your surgeon as instructed. You do not need to follow up with anesthesia unless specifically instructed to do so.  WOUND CARE INSTRUCTIONS (if applicable):  Keep a dry clean dressing on the anesthesia/puncture wound site if there is drainage.  Once the wound has quit draining you may leave it open to air.  Generally you should leave the bandage intact for twenty four hours  unless there is drainage.  If the epidural site drains for more than 36-48 hours please call the anesthesia department.  QUESTIONS?:  Please feel free to call your physician or the hospital operator if you have any questions, and they will be happy to assist you.      Esophagogastroduodenoscopy Esophagogastroduodenoscopy (EGD) is a procedure that is used to examine the lining of the esophagus, stomach, and first part of the small intestine (duodenum). A long, flexible, lighted tube with a camera attached (endoscope) is inserted down the throat to view these organs. This procedure is done to detect problems or abnormalities, such as inflammation, bleeding, ulcers, or growths, in order to treat them. The procedure lasts 5-20 minutes. It is usually an outpatient procedure, but it may need to be performed in a hospital in emergency cases. LET [facility] CARE PROVIDER KNOW ABOUT:  Any allergies you have.  All medicines you are taking, including vitamins, herbs, eye drops, creams, and over-the-counter medicines.  Previous problems you or members of your family have had with the use of anesthetics.  Any blood disorders you have.  Previous surgeries you have had.  Medical conditions you have. RISKS AND COMPLICATIONS Generally, this is a safe procedure. However, problems can occur and include:  Infection.  Bleeding.  Tearing (perforation) of the esophagus, stomach, or duodenum.  Difficulty breathing or not being able to breathe.  Excessive sweating.  Spasms of the larynx.  Slowed heartbeat.  Low blood pressure. BEFORE THE PROCEDURE  Do not eat or drink anything after midnight on the night before the procedure or as directed by your health care provider.  Do not take your regular medicines before the procedure if your health care provider asks you not to. Ask your health care provider about changing or stopping those medicines.  If you wear dentures, be prepared to remove them  before the procedure.  Arrange for someone to drive you home after the procedure. PROCEDURE  A numbing medicine (local anesthetic) may be sprayed in your throat for comfort and to stop you from gagging or coughing.  You will have an IV tube inserted in a vein in your hand or arm. You will receive medicines and fluids through this tube.  You will be given a medicine to relax you (sedative).  A pain reliever will be given through the IV tube.  A mouth guard may be placed in your mouth to protect your teeth and to keep you from biting on the endoscope.  You will be asked to lie on your left side.  The endoscope will be inserted down your throat and into your esophagus, stomach, and duodenum.  Air will be put through the endoscope to allow your health care provider to clearly view the lining of your esophagus.  The lining of your esophagus, stomach, and duodenum will be examined. During the exam, your health care provider may:  Remove tissue to be examined under a microscope (biopsy) for inflammation, infection, or other medical problems.  Remove growths.  Remove objects (foreign bodies) that are stuck.  Treat any bleeding with medicines or other devices that stop tissues from bleeding (hot cautery, clipping devices).  Widen (dilate) or stretch narrowed areas of your esophagus and stomach.  The endoscope will be withdrawn. AFTER THE PROCEDURE  You will be taken to a recovery area for observation. Your blood pressure, heart rate, breathing rate, and blood oxygen level will be monitored often until the medicines you were given have worn off.  Do not eat or drink anything until the numbing medicine has worn off and your gag reflex has returned. You may choke.  Your health care provider should be able to discuss his or her findings with you. It will take longer to discuss the test results if any biopsies were taken.   This information is not intended to replace advice given to you  by your health care provider. Make sure you discuss any questions you have with your health care provider.   Document Released: 05/24/2004 Document Revised: 02/11/2014 Document Reviewed: 12/25/2011 Elsevier Interactive Patient Education 2016 [employer]. Colonoscopy A colonoscopy is an exam to look at the entire large intestine (colon). This exam can help find problems such as tumors, polyps, inflammation, and areas of bleeding. The exam takes about 1 hour.  LET [facility] CARE PROVIDER KNOW ABOUT:   Any allergies you have.  All medicines you are taking, including vitamins, herbs, eye drops, creams, and over-the-counter medicines.  Previous problems you or members of your family have had with the use of anesthetics.  Any blood disorders you have.  Previous surgeries you have had.  Medical conditions you have. RISKS AND COMPLICATIONS  Generally, this is a safe procedure. However, as with any procedure, complications can occur. Possible complications include:  Bleeding.  Tearing or rupture of the colon wall.  Reaction to medicines given during the exam.  Infection (rare). BEFORE THE PROCEDURE   Ask your health care provider about changing or stopping your regular medicines.  You may be prescribed an oral bowel prep. This involves drinking a large amount of medicated liquid, starting the day before your procedure. The liquid will cause you to have multiple loose stools until your stool is almost clear or light green. This cleans out your colon in preparation for the procedure.  Do not eat or drink anything else once you have started the bowel prep, unless your health care provider tells you it is safe to do so.  Arrange for someone to drive you home after the procedure. PROCEDURE   You will be given medicine to help you relax (sedative).  You will lie on your side with your knees bent.  A long, flexible tube with a light and camera on the end (colonoscope) will be inserted  through the rectum and into the colon. The camera sends video back to a computer screen as it moves through the colon. The colonoscope also releases carbon dioxide gas to inflate the colon. This helps your health care provider see the area better.  During the exam, your health care provider may take a small tissue sample (biopsy) to be examined under a microscope if any abnormalities are found.  The exam is finished when the entire colon has been viewed. AFTER THE PROCEDURE   Do not drive for 24 hours after the exam.  You may have a small amount of blood in your stool.  You may pass moderate amounts of gas and have mild abdominal cramping or bloating. This is caused by the gas used to inflate your colon during the exam.  Ask when your test results will be ready and how you will get your results. Make sure you get your test results.   This information is not intended to replace advice given to you by your health care provider. Make sure you discuss any questions you have with your health care provider.   Document Released: 01/19/2000 Document Revised: 11/11/2012 Document Reviewed: 09/28/2012 Elsevier Interactive Patient Education [employer].

## 2015-04-13 ENCOUNTER — Encounter (HOSPITAL_COMMUNITY): Payer: Self-pay

## 2015-04-13 ENCOUNTER — Encounter (HOSPITAL_COMMUNITY)
Admission: RE | Admit: 2015-04-13 | Discharge: 2015-04-13 | Disposition: A | Discharge status: Home / Self Care | Payer: Medicare Other | Source: Ambulatory Visit | Attending: Gastroenterology | Admitting: Gastroenterology

## 2015-04-13 DIAGNOSIS — Z01812 Encounter for preprocedural laboratory examination: Secondary | ICD-10-CM | POA: Diagnosis not present

## 2015-04-13 HISTORY — DX: Anxiety disorder, unspecified: F41.9

## 2015-04-13 HISTORY — DX: Myoneural disorder, unspecified: G70.9

## 2015-04-13 HISTORY — DX: Depression, unspecified: F32.A

## 2015-04-13 HISTORY — DX: Bipolar disorder, unspecified: F31.9

## 2015-04-13 HISTORY — DX: Reserved for inherently not codable concepts without codable children: IMO0001

## 2015-04-13 HISTORY — DX: Unspecified dementia, unspecified severity, without behavioral disturbance, psychotic disturbance, mood disturbance, and anxiety: F03.90

## 2015-04-13 HISTORY — DX: Major depressive disorder, single episode, unspecified: F32.9

## 2015-04-13 LAB — CBC
HCT: 41.5 % (ref 36.0–46.0)
HEMOGLOBIN: 13.5 g/dL (ref 12.0–15.0)
MCH: 29.7 pg (ref 26.0–34.0)
MCHC: 32.5 g/dL (ref 30.0–36.0)
MCV: 91.2 fL (ref 78.0–100.0)
PLATELETS: 249 10*3/uL (ref 150–400)
RBC: 4.55 MIL/uL (ref 3.87–5.11)
RDW: 13.3 % (ref 11.5–15.5)
WBC: 5.5 10*3/uL (ref 4.0–10.5)

## 2015-04-13 LAB — BASIC METABOLIC PANEL
ANION GAP: 8 (ref 5–15)
BUN: 9 mg/dL (ref 6–20)
CHLORIDE: 105 mmol/L (ref 101–111)
CO2: 25 mmol/L (ref 22–32)
Calcium: 9.1 mg/dL (ref 8.9–10.3)
Creatinine, Ser: 0.69 mg/dL (ref 0.44–1.00)
GFR calc Af Amer: >60 mL/min (ref >60)
Glucose, Bld: 118 mg/dL — ABNORMAL HIGH (ref 65–99)
POTASSIUM: 3.6 mmol/L (ref 3.5–5.1)
SODIUM: 138 mmol/L (ref 135–145)

## 2015-04-17 ENCOUNTER — Encounter (HOSPITAL_COMMUNITY)
Admission: RE | Disposition: A | Discharge status: Home / Self Care | Payer: Self-pay | Source: Ambulatory Visit | Attending: Internal Medicine

## 2015-04-17 ENCOUNTER — Ambulatory Visit (HOSPITAL_COMMUNITY)
Admission: RE | Admit: 2015-04-17 | Discharge: 2015-04-17 | Disposition: A | Discharge status: Home / Self Care | Payer: Medicare Other | Source: Ambulatory Visit | Attending: Internal Medicine | Admitting: Internal Medicine

## 2015-04-17 ENCOUNTER — Other Ambulatory Visit: Payer: Self-pay | Admitting: Nurse Practitioner

## 2015-04-17 ENCOUNTER — Encounter (HOSPITAL_COMMUNITY): Payer: Self-pay | Admitting: *Deleted

## 2015-04-17 ENCOUNTER — Other Ambulatory Visit: Payer: Self-pay

## 2015-04-17 DIAGNOSIS — R1310 Dysphagia, unspecified: Secondary | ICD-10-CM | POA: Diagnosis not present

## 2015-04-17 DIAGNOSIS — Z1211 Encounter for screening for malignant neoplasm of colon: Secondary | ICD-10-CM | POA: Diagnosis not present

## 2015-04-17 DIAGNOSIS — Z538 Procedure and treatment not carried out for other reasons: Secondary | ICD-10-CM | POA: Insufficient documentation

## 2015-04-17 SURGERY — COLONOSCOPY WITH PROPOFOL
Anesthesia: Monitor Anesthesia Care

## 2015-04-17 NOTE — OR Nursing (Signed)
Patient arrived to have prep done by Dr. Laural Golden.  Upon assessing patient she only took a bottle of magnesium citrate.  She ate pasta and manwhich last night at 2000 after she did prep. Her stools are mushy brown.   Dr. Laural Golden notified and said to postpone it to [facility] and Dr. Oneida Alar will do case.  I printed off the prep instructions that were sent to her by office. Went over the instructions . She verbalized understanding. To return tomarrow at 1215 and understands to have someone with her to drive her home.

## 2015-04-17 NOTE — OR Nursing (Signed)
Patient showed up for TCS and EGD today. Procedures are supposed to be tomorrow with Dr. Oneida Alar. Dr. Laural Golden notified of patient drinking prep and being given instructions to be here today. Dr. Laural Golden said if it is ok with patient, he will perform her procedures. Patient agreed and will arrive at 1200 to have procedures at 1330. Ginger Farris at Dr. Oneida Alar office notified of the above.

## 2015-04-18 ENCOUNTER — Ambulatory Visit (HOSPITAL_COMMUNITY): Payer: Medicare Other | Admitting: Anesthesiology

## 2015-04-18 ENCOUNTER — Encounter (HOSPITAL_COMMUNITY): Payer: Self-pay | Admitting: *Deleted

## 2015-04-18 ENCOUNTER — Ambulatory Visit (HOSPITAL_COMMUNITY)
Admission: RE | Admit: 2015-04-18 | Discharge: 2015-04-18 | Disposition: A | Discharge status: Home / Self Care | Payer: Medicare Other | Source: Ambulatory Visit | Attending: Gastroenterology | Admitting: Gastroenterology

## 2015-04-18 ENCOUNTER — Encounter (HOSPITAL_COMMUNITY)
Admission: RE | Disposition: A | Discharge status: Home / Self Care | Payer: Self-pay | Source: Ambulatory Visit | Attending: Gastroenterology

## 2015-04-18 DIAGNOSIS — K644 Residual hemorrhoidal skin tags: Secondary | ICD-10-CM | POA: Insufficient documentation

## 2015-04-18 DIAGNOSIS — K219 Gastro-esophageal reflux disease without esophagitis: Secondary | ICD-10-CM | POA: Insufficient documentation

## 2015-04-18 DIAGNOSIS — K635 Polyp of colon: Secondary | ICD-10-CM | POA: Diagnosis not present

## 2015-04-18 DIAGNOSIS — F329 Major depressive disorder, single episode, unspecified: Secondary | ICD-10-CM | POA: Diagnosis not present

## 2015-04-18 DIAGNOSIS — D122 Benign neoplasm of ascending colon: Secondary | ICD-10-CM | POA: Diagnosis not present

## 2015-04-18 DIAGNOSIS — R1310 Dysphagia, unspecified: Secondary | ICD-10-CM | POA: Insufficient documentation

## 2015-04-18 DIAGNOSIS — R109 Unspecified abdominal pain: Secondary | ICD-10-CM | POA: Diagnosis not present

## 2015-04-18 DIAGNOSIS — F319 Bipolar disorder, unspecified: Secondary | ICD-10-CM | POA: Insufficient documentation

## 2015-04-18 DIAGNOSIS — F419 Anxiety disorder, unspecified: Secondary | ICD-10-CM | POA: Insufficient documentation

## 2015-04-18 DIAGNOSIS — K5730 Diverticulosis of large intestine without perforation or abscess without bleeding: Secondary | ICD-10-CM

## 2015-04-18 DIAGNOSIS — D127 Benign neoplasm of rectosigmoid junction: Secondary | ICD-10-CM | POA: Diagnosis not present

## 2015-04-18 DIAGNOSIS — Q394 Esophageal web: Secondary | ICD-10-CM | POA: Insufficient documentation

## 2015-04-18 DIAGNOSIS — Z87891 Personal history of nicotine dependence: Secondary | ICD-10-CM | POA: Diagnosis not present

## 2015-04-18 DIAGNOSIS — Z1211 Encounter for screening for malignant neoplasm of colon: Secondary | ICD-10-CM | POA: Insufficient documentation

## 2015-04-18 DIAGNOSIS — Z79899 Other long term (current) drug therapy: Secondary | ICD-10-CM | POA: Insufficient documentation

## 2015-04-18 DIAGNOSIS — Z7982 Long term (current) use of aspirin: Secondary | ICD-10-CM | POA: Diagnosis not present

## 2015-04-18 DIAGNOSIS — I10 Essential (primary) hypertension: Secondary | ICD-10-CM | POA: Diagnosis not present

## 2015-04-18 HISTORY — PX: COLONOSCOPY WITH PROPOFOL: SHX5780

## 2015-04-18 HISTORY — PX: POLYPECTOMY: SHX5525

## 2015-04-18 HISTORY — PX: ESOPHAGOGASTRODUODENOSCOPY (EGD) WITH PROPOFOL: SHX5813

## 2015-04-18 SURGERY — COLONOSCOPY WITH PROPOFOL
Anesthesia: Monitor Anesthesia Care

## 2015-04-18 MED ORDER — PROPOFOL 500 MG/50ML IV EMUL
INTRAVENOUS | Status: DC | PRN
  Administered 2015-04-18: 125 ug/kg/min via INTRAVENOUS
  Administered 2015-04-18: 14:00:00 via INTRAVENOUS
  Administered 2015-04-18: 75 ug/kg/min via INTRAVENOUS

## 2015-04-18 MED ORDER — FENTANYL CITRATE (PF) 100 MCG/2ML IJ SOLN
25.0000 ug | INTRAMUSCULAR | Status: AC
  Administered 2015-04-18 (×2): 25 ug via INTRAVENOUS

## 2015-04-18 MED ORDER — MIDAZOLAM HCL 2 MG/2ML IJ SOLN
1.0000 mg | INTRAMUSCULAR | Status: DC | PRN
Start: 2015-04-18 — End: 2015-04-18
  Administered 2015-04-18: 2 mg via INTRAVENOUS

## 2015-04-18 MED ORDER — FENTANYL CITRATE (PF) 100 MCG/2ML IJ SOLN
INTRAMUSCULAR | Status: AC
  Filled 2015-04-18: qty 2

## 2015-04-18 MED ORDER — LACTATED RINGERS IV SOLN
INTRAVENOUS | Status: DC
  Administered 2015-04-18: 1000 mL via INTRAVENOUS

## 2015-04-18 MED ORDER — STERILE WATER FOR IRRIGATION IR SOLN
Status: DC | PRN
  Administered 2015-04-18: 13:00:00

## 2015-04-18 MED ORDER — MIDAZOLAM HCL 5 MG/5ML IJ SOLN
INTRAMUSCULAR | Status: DC | PRN
  Administered 2015-04-18: 2 mg via INTRAVENOUS

## 2015-04-18 MED ORDER — PROPOFOL 10 MG/ML IV BOLUS
INTRAVENOUS | Status: AC
  Filled 2015-04-18: qty 20

## 2015-04-18 MED ORDER — FENTANYL CITRATE (PF) 100 MCG/2ML IJ SOLN: 25.0000 ug | INTRAMUSCULAR | Status: DC | PRN

## 2015-04-18 MED ORDER — MIDAZOLAM HCL 2 MG/2ML IJ SOLN
INTRAMUSCULAR | Status: AC
  Filled 2015-04-18: qty 2

## 2015-04-18 MED ORDER — ONDANSETRON HCL 4 MG/2ML IJ SOLN: 4.0000 mg | Freq: Once | INTRAMUSCULAR | Status: DC | PRN

## 2015-04-18 NOTE — Transfer of Care (Signed)
Immediate Anesthesia Transfer of Care Note  Patient: Lorraine Ryan  Procedure(s) Performed: Procedure(s) with comments: COLONOSCOPY WITH PROPOFOL (N/A) - unable to move patient - doing split prep ESOPHAGOGASTRODUODENOSCOPY (EGD) WITH PROPOFOL (N/A) POLYPECTOMY - ascending colon polyp  Patient Location: PACU  Anesthesia Type:MAC  Level of Consciousness: sedated  Airway & Oxygen Therapy: Patient Spontanous Breathing and Patient connected to face mask oxygen  Post-op Assessment: Report given to RN, Post -op Vital signs reviewed and stable and Patient moving all extremities  Post vital signs: Reviewed and stable  Last Vitals:  Filed Vitals:   04/18/15 1300 04/18/15 1305  BP: 117/67 109/67  Pulse:    Temp:    Resp: 18 24    Complications: No apparent anesthesia complications

## 2015-04-18 NOTE — H&P (Signed)
Primary Care Physician:  Lance Bosch, NP Primary Gastroenterologist:  Dr. Oneida Alar  Pre-Procedure History & Physical: HPI:  Lorraine Ryan is a 60 y.o. female here for DYSPHAGIA/screening.  Past Medical History  Diagnosis Date  . Hypertension     No meds prescribed; states intermittent  . Back pain   . Stroke [facility])     caused numbness in leg  . Chronic headaches   . GERD (gastroesophageal reflux disease)   . Shortness of breath dyspnea   . Depression   . Bipolar disorder ([place])   . Dementia   . Anxiety   . Neuromuscular disorder [facility])     Past Surgical History  Procedure Laterality Date  . Ganglion cyst excision    . Abdominal hysterectomy    . Tonsillectomy    . Lumbar laminectomy/decompression microdiscectomy N/A 02/08/2015    Procedure: L4-5 Decompression;  Surgeon: Marybelle Killings, MD;  Location: [place];  Service: Orthopedics;  Laterality: N/A;    Prior to Admission medications   Medication Sig Start Date End Date Taking? Authorizing Provider  acetaminophen (TYLENOL) 325 MG tablet Take 650 mg by mouth every 6 (six) hours as needed for moderate pain.   Yes Historical Provider, MD  aspirin EC 81 MG tablet Take 1 tablet (81 mg total) by mouth daily. 03/01/13  Yes Lelon Perla, MD  atenolol (TENORMIN) 25 MG tablet Take 1 tablet (25 mg total) by mouth daily. 11/28/14  Yes Lance Bosch, NP  baclofen (LIORESAL) 10 MG tablet Take 1 tablet (10 mg total) by mouth at bedtime. 11/28/14  Yes Lance Bosch, NP  buPROPion (WELLBUTRIN XL) 150 MG 24 hr tablet Take 150 mg by mouth daily.   Yes Historical Provider, MD  colchicine 0.6 MG tablet Take 1 tablet (0.6 mg total) by mouth 2 (two) times daily as needed. 05/27/13  Yes Theodis Blaze, MD  DULoxetine (CYMBALTA) 30 MG capsule Take 30 mg by mouth daily.   Yes Historical Provider, MD  gabapentin (NEURONTIN) 300 MG capsule Take 1 capsule (300 mg total) by mouth 3 (three) times daily. 11/28/14  Yes Lance Bosch, NP   HYDROcodone-acetaminophen (NORCO) 5-325 MG tablet Take 2 tablets by mouth every 6 (six) hours as needed for moderate pain. 02/09/15  Yes Marybelle Killings, MD  HYDROcodone-acetaminophen (NORCO/VICODIN) 5-325 MG tablet Take 1 tablet by mouth every 6 (six) hours as needed (pain).   Yes Historical Provider, MD  lurasidone (LATUDA) 20 MG TABS tablet Take 20 mg by mouth daily with breakfast.   Yes Historical Provider, MD  omeprazole (PRILOSEC) 20 MG capsule Take 1 capsule (20 mg total) by mouth 2 (two) times daily before a meal. 03/30/15  Yes Carlis Stable, NP  traZODone (DESYREL) 100 MG tablet Take 100 mg by mouth at bedtime.   Yes Historical Provider, MD  magnesium hydroxide (MILK OF MAGNESIA) 400 MG/5ML suspension Take 15 mLs by mouth at bedtime as needed for mild constipation. 08/10/14   [name], MD  Na Sulfate-K Sulfate-Mg Sulf SOLN Take 1 kit by mouth once. 03/30/15 04/29/15  Carlis Stable, NP  ondansetron (ZOFRAN) 8 MG tablet Take 1 tablet (8 mg total) by mouth every 8 (eight) hours as needed for nausea or vomiting. Patient taking differently: Take 8 mg by mouth every 6 (six) hours as needed for nausea or vomiting.  03/06/13   Lajean Saver, MD    Allergies as of 04/17/2015 - Review Complete 04/17/2015  Allergen Reaction Noted  . Codeine  Other (See Comments) 12/05/2012  . Oxycodone Nausea Only and Palpitations 11/28/2012  . Prednisone Other (See Comments) 11/28/2012  . Tramadol Nausea Only 03/06/2013    Family History  Problem Relation Age of Onset  . Heart disease      No family history  . Diabetes Mother   . Hypertension Mother   . Colon cancer Neg Hx     Social History   Social History  . Marital Status: Divorced    Spouse Name: N/A  . Number of Children: 5  . Years of Education: N/A   Occupational History  . Not on file.   Social History Main Topics  . Smoking status: Former Smoker    Quit date: 03/29/1985  . Smokeless tobacco: Never Used  . Alcohol Use: No     Comment: Used  to drink heavily, no ETOH in "30-some years"  . Drug Use: No     Comment: History of crack, "no drugs in 30-some years"  . Sexual Activity: Not on file   Other Topics Concern  . Not on file   Social History Narrative    Review of Systems: See HPI, otherwise negative ROS   Physical Exam: BP 137/78 mmHg  Pulse 82  Temp(Src) 97.4 F (36.3 C) (Oral)  Resp 20  SpO2 97% General:   Alert,  pleasant and cooperative in NAD Head:  Normocephalic and atraumatic. Neck:  Supple; Lungs:  Clear throughout to auscultation.    Heart:  Regular rate and rhythm. Abdomen:  Soft, nontender and nondistended. Normal bowel sounds, without guarding, and without rebound.   Neurologic:  Alert and  oriented x4;  grossly normal neurologically.  Impression/Plan:     DYSPHAGIA/screening  PLAN:  EGD/DIL/tcs TODAY

## 2015-04-18 NOTE — Discharge Instructions (Signed)
You had 1 COLON polyp removed. I STRETCHED YOUR ESOPHAGUS DUE YOUR PROBLEMS SWALLOWING. YOUR PROBLEM SWALLOWING IS MOST LIKELY DUE TO UNCONTROLLED REFLUX. YOU HAVE SMALL INTERNAL HEMORRHOIDS AND DIVERTICULOSIS IN YOUR LEFT COLON.   CONTINUE YOUR WEIGHT LOSS EFFORTS. LOSE 10 POUNDS.  DRINK WATER TO KEEP YOUR URINE LIGHT YELLOW.  AVOID FOOD THAT TRIGGER REFLUX. SEE INFO BELOW.  FOLLOW A HIGH FIBER/LOW FAT DIET. AVOID ITEMS THAT CAUSE BLOATING. MEATS SHOULD BE BAKED, BROILED, OR BOILED. AVOID FRIED FOODS. SEE INFO BELOW.  YOUR BIOPSY RESULTS WILL BE AVAILABLE IN MY CHART MAR 17 AND MY OFFICE WILL CONTACT YOU IN 10-14 DAYS WITH YOUR RESULTS.   CONTINUE ASPIRIN.   CONTINUE OMEPRAZOLE.  TAKE 30 MINUTES PRIOR TO YOURS MEALS TWICE DAILY.  Follow up in 4 mos.  Next colonoscopy in 5-10 years.   ENDOSCOPY Care After Read the instructions outlined below and refer to this sheet in the next week. These discharge instructions provide you with general information on caring for yourself after you leave the hospital. While your treatment has been planned according to the most current medical practices available, unavoidable complications occasionally occur. If you have any problems or questions after discharge, call DR. Ahren Pettinger, [phone].  ACTIVITY  You may resume your regular activity, but move at a slower pace for the next 24 hours.   Take frequent rest periods for the next 24 hours.   Walking will help get rid of the air and reduce the bloated feeling in your belly (abdomen).   No driving for 24 hours (because of the medicine (anesthesia) used during the test).   You may shower.   Do not sign any important legal documents or operate any machinery for 24 hours (because of the anesthesia used during the test).    NUTRITION  Drink plenty of fluids.   You may resume your normal diet as instructed by your doctor.   Begin with a light meal and progress to your normal diet. Heavy or fried  foods are harder to digest and may make you feel sick to your stomach (nauseated).   Avoid alcoholic beverages for 24 hours or as instructed.    MEDICATIONS  You may resume your normal medications.   WHAT YOU CAN EXPECT TODAY  Some feelings of bloating in the abdomen.   Passage of more gas than usual.   Spotting of blood in your stool or on the toilet paper  .  IF YOU HAD POLYPS REMOVED DURING THE ENDOSCOPY:  Eat a soft diet IF YOU HAVE NAUSEA, BLOATING, ABDOMINAL PAIN, OR VOMITING.    FINDING OUT THE RESULTS OF YOUR TEST Not all test results are available during your visit. DR. Oneida Alar WILL CALL YOU WITHIN 14 DAYS OF YOUR PROCEDUE WITH YOUR RESULTS. Do not assume everything is normal if you have not heard from DR. Verba Ainley, CALL HER OFFICE AT [phone].  SEEK IMMEDIATE MEDICAL ATTENTION AND CALL THE OFFICE: [phone] IF:  You have more than a spotting of blood in your stool.   Your belly is swollen (abdominal distention).   You are nauseated or vomiting.   You have a temperature over 101F.   You have abdominal pain or discomfort that is severe or gets worse throughout the day.    Lifestyle and home remedies TO CONTROL REFLUX  You may eliminate or reduce the frequency of heartburn by making the following lifestyle changes:   Control your weight. Being overweight is a major risk factor for heartburn and GERD. Excess pounds  put pressure on your abdomen, pushing up your stomach and causing acid to back up into your esophagus.    Eat smaller meals. 4 TO 6 MEALS A DAY. This reduces pressure on the lower esophageal sphincter, helping to prevent the valve from opening and acid from washing back into your esophagus.    Loosen your belt. Clothes that fit tightly around your waist put pressure on your abdomen and the lower esophageal sphincter.    Eliminate heartburn triggers. Everyone has specific triggers. Common triggers such as fatty or fried foods, spicy food,  tomato sauce, carbonated beverages, alcohol, chocolate, mint, garlic, onion, caffeine and nicotine may make heartburn worse.    Avoid stooping or bending. Tying your shoes is OK. Bending over for longer periods to weed your garden isn't, especially soon after eating.    Don't lie down after a meal. Wait at least three to four hours after eating before going to bed, and don't lie down right after eating.    Alternative medicine  Several home remedies exist for treating GERD, but they provide only temporary relief. They include drinking baking soda (sodium bicarbonate) added to water or drinking other fluids such as baking soda mixed with cream of tartar and water.   Although these liquids create temporary relief by neutralizing, washing away or buffering acids, eventually they aggravate the situation by adding gas and fluid to your stomach, increasing pressure and causing more acid reflux. Further, adding more sodium to your diet may increase your blood pressure and add stress to your heart, and excessive bicarbonate ingestion can alter the acid-base balance in your body.  Polyps, Colon  A polyp is extra tissue that grows inside your body. Colon polyps grow in the large intestine. The large intestine, also called the colon, is part of your digestive system. It is a long, hollow tube at the end of your digestive tract where your body makes and stores stool. Most polyps are not dangerous. They are benign. This means they are not cancerous. But over time, some types of polyps can turn into cancer. Polyps that are smaller than a pea are usually not harmful. But larger polyps could someday become or may already be cancerous. To be safe, doctors remove all polyps and test them.   PREVENTION There is not one sure way to prevent polyps. You might be able to lower your risk of getting them if you:  Eat more fruits and vegetables and less fatty food.   Do not smoke.   Avoid alcohol.   Exercise every  day.   Lose weight if you are overweight.   Eating more calcium and folate can also lower your risk of getting polyps. Some foods that are rich in calcium are milk, cheese, and broccoli. Some foods that are rich in folate are chickpeas, kidney beans, and spinach.   Diverticulosis Diverticulosis is a common condition that develops when small pouches (diverticula) form in the wall of the colon. The risk of diverticulosis increases with age. It happens more often in people who eat a low-fiber diet. Most individuals with diverticulosis have no symptoms. Those individuals with symptoms usually experience belly (abdominal) pain, constipation, or loose stools (diarrhea).  HOME CARE INSTRUCTIONS Increase the amount of fiber in your diet as directed by your caregiver or dietician. This may reduce symptoms of diverticulosis.  Drink at least 6 to 8 glasses of water each day to prevent constipation.  Try not to strain when you have a bowel movement.  Avoiding nuts  and seeds to prevent complications is NOT NECESSARY.   FOODS HAVING HIGH FIBER CONTENT INCLUDE: Fruits. Apple, peach, pear, tangerine, raisins, prunes.  Vegetables. Brussels sprouts, asparagus, broccoli, cabbage, carrot, cauliflower, romaine lettuce, spinach, summer squash, tomato, winter squash, zucchini.  Starchy Vegetables. Baked beans, kidney beans, lima beans, split peas, lentils, potatoes (with skin).  Grains. Whole wheat bread, brown rice, bran flake cereal, plain oatmeal, white rice, shredded wheat, bran muffins.    SEEK IMMEDIATE MEDICAL CARE IF: You develop increasing pain or severe bloating.  You have an oral temperature above 101F.  You develop vomiting or bowel movements that are bloody or black.    Hemorrhoids Hemorrhoids are dilated (enlarged) veins around the rectum. Sometimes clots will form in the veins. This makes them swollen and painful. These are called thrombosed hemorrhoids. Causes of hemorrhoids  include:  Constipation.   Straining to have a bowel movement.   HEAVY LIFTING  HOME CARE INSTRUCTIONS  Eat a well balanced diet and drink 6 to 8 glasses of water every day to avoid constipation. You may also use a bulk laxative.   Avoid straining to have bowel movements.   Keep anal area dry and clean.   Do not use a donut shaped pillow or sit on the toilet for long periods. This increases blood pooling and pain.   Move your bowels when your body has the urge; this will require less straining and will decrease pain and pressure.

## 2015-04-18 NOTE — Progress Notes (Signed)
REVIEWED-NO ADDITIONAL RECOMMENDATIONS. 

## 2015-04-18 NOTE — Anesthesia Preprocedure Evaluation (Signed)
Anesthesia Evaluation  Patient identified by MRN, date of birth, ID band Patient awake    Reviewed: Allergy & Precautions, NPO status , Patient's Chart, lab work & pertinent test results, reviewed documented beta blocker date and time   History of Anesthesia Complications Negative for: history of anesthetic complications  Airway Mallampati: II  TM Distance: >3 FB Neck ROM: Full    Dental  (+) Edentulous Upper, Partial Lower, Dental Advisory Given   Pulmonary shortness of breath, former smoker,    breath sounds clear to auscultation       Cardiovascular hypertension, Pt. on home beta blockers  Rhythm:Regular Rate:Normal     Neuro/Psych  Headaches, PSYCHIATRIC DISORDERS Anxiety Depression Bipolar Disorder Unilateral leg weakness CVA, Residual Symptoms negative psych ROS   GI/Hepatic negative GI ROS, Neg liver ROS, GERD  ,  Endo/Other  negative endocrine ROS  Renal/GU negative Renal ROS  negative genitourinary   Musculoskeletal negative musculoskeletal ROS (+)   Abdominal   Peds negative pediatric ROS (+)  Hematology negative hematology ROS (+)   Anesthesia Other Findings   Reproductive/Obstetrics negative OB ROS                             Anesthesia Physical Anesthesia Plan  ASA: III  Anesthesia Plan: MAC   Post-op Pain Management:    Induction: Intravenous  Airway Management Planned: Simple Face Mask  Additional Equipment:   Intra-op Plan:   Post-operative Plan:   Informed Consent: I have reviewed the patients History and Physical, chart, labs and discussed the procedure including the risks, benefits and alternatives for the proposed anesthesia with the patient or authorized representative who has indicated his/her understanding and acceptance.     Plan Discussed with:   Anesthesia Plan Comments:         Anesthesia Quick Evaluation

## 2015-04-18 NOTE — Op Note (Signed)
[facility] Patient Name: Lorraine Ryan Procedure Date: 04/18/2015 1:44 PM MRN: [id number] Date of Birth: 22-Mar-1955 Attending MD: Barney Drain , MD CSN: [id number] Age: 60 Admit Type: Outpatient Procedure:                Upper GI endoscopy Indications:              Dysphagia Providers:                Barney Drain, MD, Janeece Riggers, RN, Randa Spike,                            Technician Referring MD:             Lance Bosch (Referring MD) Medicines:                Monitored Anesthesia Care Complications:            No immediate complications. Estimated Blood Loss:     Estimated blood loss: none. Procedure:                Pre-Anesthesia Assessment:                           - Prior to the procedure, a History and Physical                            was performed, and patient medications and                            allergies were reviewed. The patient's tolerance of                            previous anesthesia was also reviewed. The risks                            and benefits of the procedure and the sedation                            options and risks were discussed with the patient.                            All questions were answered, and informed consent                            was obtained. Anticoagulants: The patient has taken                            aspirin. It was decided not to withhold this                            medication prior to the procedure. ASA Grade                            Assessment: II - A patient with mild systemic  disease. After reviewing the risks and benefits,                            the patient was deemed in satisfactory condition to                            undergo the procedure. After obtaining informed                            consent, the endoscope was passed under direct                            vision. Throughout the procedure, the patient's                            blood pressure,  pulse, and oxygen saturations were                            monitored continuously. The [id number]                            scope was introduced through the mouth, and                            advanced to the second part of duodenum. The upper                            GI endoscopy was accomplished without difficulty.                            The patient tolerated the procedure well. 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 A guidewire was placed and       the scope was withdrawn. Dilation was performed with a Savary dilator       with mild resistance at 16 mm and 17 mm. Estimated blood loss: none.      The entire examined stomach was normal.      The duodenal bulb and second portion of the duodenum were normal. Impression:               - Web in the proximal esophagus. Dilated.                           - Normal stomach.                           - Normal examined jejunum.                           - No specimens collected. Moderate Sedation:      Moderate (conscious) sedation was personally administered by an       anesthesia professional. The following parameters were monitored: oxygen       saturation, heart rate, blood pressure, respiratory rate, EKG, adequacy  of pulmonary ventilation, and response to care. Recommendation:           - Patient has a contact number available for                            emergencies. The signs and symptoms of potential                            delayed complications were discussed with the                            patient. Return to normal activities tomorrow.                            Written discharge instructions were provided to the                            patient.                           CONTINUE WEIGHT LOSS EFFORTS. Marland Kitchen                           DRINK WATER TO KEEP YOUR URINE LIGHT YELLOW.                            AVOID FOOD THAT TRIGGER LOOSE STOOLS.                           FOLLOW A HIGH FIBER/LOW FAT DIET. AVOID ITEMS THAT                            CAUSE BLOATING. MEATS SHOULD BE BAKED, BROILED, OR                            BOILED. AVOID FRIED FOODS.                           AWAIT YOUR BIOPSY RESULTS.                           CONTINUE ASPIRIN.                           CONTINUE OMEPRAZOLE. TAKE 30 MINUTES PRIOR TO YOURS                            MEALS TWICE DAILY.                           Follow up in 4 mos.                           Next colonoscopy in 5-10 years.                           - Resume previous  diet.                           - Medication reconciliation was performed, and a                            list of the patient's discharge medications was                            provided to the patient. Procedure Code(s):        --- Professional ---                           [phone], Esophagogastroduodenoscopy, flexible,                            transoral; with insertion of guide wire followed by                            passage of dilator(s) through esophagus over guide                            wire Diagnosis Code(s):        --- Professional ---                           Q39.4, Esophageal web                           R13.10, Dysphagia, unspecified CPT copyright 2016 [employer]. All rights reserved. The codes documented in this report are preliminary and upon coder review may  be revised to meet current compliance requirements. Barney Drain, MD Barney Drain, MD 04/18/2015 2:29:14 PM This report has been signed electronically. Number of Addenda: 0

## 2015-04-18 NOTE — Op Note (Signed)
[facility] Patient Name: Lorraine Ryan Procedure Date: 04/18/2015 12:53 PM MRN: [id number] Date of Birth: 1955/09/23 Attending MD: Barney Drain , MD CSN: [id number] Age: 60 Admit Type: Outpatient Procedure:                Colonoscopy Indications:              Screening for colorectal malignant neoplasm Providers:                Barney Drain, MD, Janeece Riggers, RN, Randa Spike,                            Technician Referring MD:             Lance Bosch (Referring MD) Medicines:                Propofol per Anesthesia Complications:            No immediate complications. Estimated Blood Loss:     Estimated blood loss: none. Procedure:                Pre-Anesthesia Assessment:                           - Prior to the procedure, a History and Physical                            was performed, and patient medications and                            allergies were reviewed. The patient's tolerance of                            previous anesthesia was also reviewed. The risks                            and benefits of the procedure and the sedation                            options and risks were discussed with the patient.                            All questions were answered, and informed consent                            was obtained. Prior Anticoagulants: The patient has                            taken aspirin, last dose was day of procedure. ASA                            Grade Assessment: II - A patient with mild systemic                            disease. After reviewing the risks and benefits,  the patient was deemed in satisfactory condition to                            undergo the procedure.                           After obtaining informed consent, the colonoscope                            was passed under direct vision. Throughout the                            procedure, the patient's blood pressure, pulse, and       oxygen saturations were monitored continuously. The                            EC-[id number] scope was introduced through                            the anus and advanced to the the cecum, identified                            by appendiceal orifice and ileocecal valve. The                            patient tolerated the procedure well. The quality                            of the bowel preparation was good. The ileocecal                            valve, appendiceal orifice, and rectum were                            photographed. The quality of the bowel preparation                            was good. Scope In: 1:25:52 PM Scope Out: 1:41:34 PM Scope Withdrawal Time: 0 hours 11 minutes 24 seconds  Total Procedure Duration: 0 hours 15 minutes 42 seconds  Findings:      The digital rectal exam findings include non-thrombosed external       hemorrhoids. Pertinent negatives include weak sphincter tone.      A few small-mouthed diverticula were found in the sigmoid colon.      A 3 mm polyp was found in the mid ascending colon. The polyp was       sessile. The polyp was removed with a cold biopsy forceps. Resection and       retrieval were complete.      Non-bleeding external and internal hemorrhoids were found during       retroflexion. The hemorrhoids were small. Impression:               - Non-thrombosed external hemorrhoids found on  digital rectal exam.                           - Diverticulosis in the sigmoid colon.                           - One 3 mm polyp in the mid ascending colon,                            removed with a cold biopsy forceps. Resected and                            retrieved.                           - Non-bleeding external and internal hemorrhoids. Moderate Sedation:      Moderate (conscious) sedation was personally administered by an       anesthesia professional. The following parameters were monitored: oxygen        saturation, heart rate, blood pressure, respiratory rate, EKG, adequacy       of pulmonary ventilation, and response to care. Recommendation:           - Patient has a contact number available for                            emergencies. The signs and symptoms of potential                            delayed complications were discussed with the                            patient. Return to normal activities tomorrow.                            Written discharge instructions were provided to the                            patient.                           - High fiber diet.                           - Medication reconciliation was performed, and a                            list of the patient's discharge medications was                            provided to the patient.                           - Await pathology results.                           - Repeat colonoscopy in 5-10 years for surveillance  based on pathology results.                           CONTINUE YOUR WEIGHT LOSS EFFORTS. LOSE 10 POUNDS.                           DRINK WATER TO KEEP YOUR URINE LIGHT YELLOW.                           AVOID FOOD THAT TRIGGER LOOSE STOOLS.                           FOLLOW A HIGH FIBER/LOW FAT DIET. AVOID ITEMS THAT                            CAUSE BLOATING. MEATS SHOULD BE BAKED, BROILED, OR                            BOILED. AVOID FRIED FOODS. SEE INFO BELOW.                           CONTINUE ASPIRIN.                           CONTINUE OMEPRAZOLE. TAKE 30 MINUTES PRIOR TO YOURS                            MEALS TWICE DAILY.                           Follow up in 4 mos. Procedure Code(s):        --- Professional ---                           [phone], Colonoscopy, flexible; with biopsy, single                            or multiple Diagnosis Code(s):        --- Professional ---                           Z12.11, Encounter for screening for malignant                             neoplasm of colon                           K64.4, Residual hemorrhoidal skin tags                           K64.8, Other hemorrhoids                           D12.2, Benign neoplasm of ascending colon                           K57.30, Diverticulosis of  large intestine without                            perforation or abscess without bleeding CPT copyright 2016 American Medical Association. All rights reserved. The codes documented in this report are preliminary and upon coder review may  be revised to meet current compliance requirements. Barney Drain, MD Barney Drain, MD 04/18/2015 2:36:11 PM This report has been signed electronically. Number of Addenda: 0

## 2015-04-18 NOTE — Anesthesia Postprocedure Evaluation (Signed)
Anesthesia Post Note  Patient: Lilium Wheller  Procedure(s) Performed: Procedure(s) (LRB): COLONOSCOPY WITH PROPOFOL (N/A) ESOPHAGOGASTRODUODENOSCOPY (EGD) WITH PROPOFOL (N/A) POLYPECTOMY  Patient location during evaluation: PACU Anesthesia Type: MAC Level of consciousness: awake and alert, oriented and patient cooperative Pain management: pain level controlled Vital Signs Assessment: post-procedure vital signs reviewed and stable Respiratory status: spontaneous breathing and patient connected to face mask oxygen Cardiovascular status: blood pressure returned to baseline and stable Postop Assessment: no signs of nausea or vomiting Anesthetic complications: no    Last Vitals:  Filed Vitals:   04/18/15 1305 04/18/15 1408  BP: 109/67   Pulse:    Temp:  36.4 C  Resp: 24     Last Pain:  Filed Vitals:   04/18/15 1412  PainSc: 8                  Courtnee Myer J

## 2015-04-24 ENCOUNTER — Encounter (HOSPITAL_COMMUNITY): Payer: Self-pay | Admitting: Gastroenterology

## 2015-04-25 DIAGNOSIS — H25013 Cortical age-related cataract, bilateral: Secondary | ICD-10-CM | POA: Diagnosis not present

## 2015-04-25 DIAGNOSIS — H2513 Age-related nuclear cataract, bilateral: Secondary | ICD-10-CM | POA: Diagnosis not present

## 2015-04-25 DIAGNOSIS — H16223 Keratoconjunctivitis sicca, not specified as Sjogren's, bilateral: Secondary | ICD-10-CM | POA: Diagnosis not present

## 2015-04-28 ENCOUNTER — Other Ambulatory Visit: Payer: Self-pay | Admitting: Internal Medicine

## 2015-04-28 ENCOUNTER — Telehealth: Payer: Self-pay | Admitting: Internal Medicine

## 2015-04-28 DIAGNOSIS — I10 Essential (primary) hypertension: Secondary | ICD-10-CM

## 2015-04-28 MED ORDER — ATENOLOL 25 MG PO TABS: 25.0000 mg | ORAL_TABLET | Freq: Every day | ORAL | Status: DC

## 2015-04-28 MED FILL — OMEPRAZOLE DR 20 MG CAPSULE: 20 | 30 days supply | Qty: 60 | Fill #1

## 2015-04-28 MED FILL — ATENOLOL 25 MG TABLET: 25 | 30 days supply | Qty: 30 | Fill #0

## 2015-04-28 MED FILL — GABAPENTIN 300 MG CAPSULE: 300 | 30 days supply | Qty: 90 | Fill #0

## 2015-04-28 NOTE — Telephone Encounter (Signed)
Tried to call patient in reference to her refill request Patient not available Unable to leave message -voice mail is not set up

## 2015-04-28 NOTE — Telephone Encounter (Signed)
Patient came in requesting a medication refill for atenolol. Please follow up.

## 2015-05-02 ENCOUNTER — Other Ambulatory Visit: Payer: Self-pay

## 2015-05-02 ENCOUNTER — Ambulatory Visit (HOSPITAL_BASED_OUTPATIENT_CLINIC_OR_DEPARTMENT_OTHER): Payer: Medicare Other | Admitting: Internal Medicine

## 2015-05-02 ENCOUNTER — Encounter: Payer: Self-pay | Admitting: Internal Medicine

## 2015-05-02 ENCOUNTER — Ambulatory Visit (HOSPITAL_COMMUNITY)
Admission: RE | Admit: 2015-05-02 | Discharge: 2015-05-02 | Disposition: A | Discharge status: Home / Self Care | Payer: Medicare Other | Source: Ambulatory Visit | Attending: Internal Medicine | Admitting: Internal Medicine

## 2015-05-02 VITALS — BP 131/91 | HR 89 | Temp 97.7°F | Resp 18 | Ht 66.0 in

## 2015-05-02 DIAGNOSIS — I4581 Long QT syndrome: Secondary | ICD-10-CM

## 2015-05-02 DIAGNOSIS — I4589 Other specified conduction disorders: Secondary | ICD-10-CM | POA: Insufficient documentation

## 2015-05-02 DIAGNOSIS — R9431 Abnormal electrocardiogram [ECG] [EKG]: Secondary | ICD-10-CM

## 2015-05-02 DIAGNOSIS — R05 Cough: Secondary | ICD-10-CM | POA: Diagnosis not present

## 2015-05-02 DIAGNOSIS — R079 Chest pain, unspecified: Secondary | ICD-10-CM

## 2015-05-02 DIAGNOSIS — R0602 Shortness of breath: Secondary | ICD-10-CM | POA: Insufficient documentation

## 2015-05-02 DIAGNOSIS — I491 Atrial premature depolarization: Secondary | ICD-10-CM | POA: Diagnosis not present

## 2015-05-02 DIAGNOSIS — J069 Acute upper respiratory infection, unspecified: Secondary | ICD-10-CM

## 2015-05-02 MED ORDER — AZITHROMYCIN 250 MG PO TABS: ORAL_TABLET | ORAL | Status: DC

## 2015-05-02 MED FILL — AZITHROMYCIN 250 MG TABLET: 250 | 5 days supply | Qty: 6 | Fill #0

## 2015-05-02 NOTE — Patient Instructions (Signed)
Decrease your Trazone done down to 50 mg so that means take a 1/2 tablet. I will see you again in 4 weeks and we will recheck your EKG to see if we have improvement. I will send records to [facility] to let them know that I have decreased your dose as well.

## 2015-05-02 NOTE — Progress Notes (Addendum)
Patient ID: Lorraine Ryan, female   DOB: Jul 01, 1955, 60 y.o.   MRN: [id number]  CC: chest pain, cough  HPI: Lorraine Ryan is a 60 y.o. female here today for a follow up visit.  Patient has past medical history of HTN, CVA, Bipolar depression, and anxiety. Patient presents today with complaints of cough and SOB which started 1 week ago. Chest pain is intermittent and is often associated with forceful coughing. Cough has been present for 1 month. Cough is so forceful she vomits. Chest pain is sharp and last briefly. Pain does not radiate. Patient is a current smoker and takes omeprazole twice per day for acid reflux.   Allergies  Allergen Reactions  . Codeine Other (See Comments)    Headache  . Oxycodone Nausea Only and Palpitations  . Prednisone Other (See Comments)    Headache  . Tramadol Nausea Only   Past Medical History  Diagnosis Date  . Hypertension     No meds prescribed; states intermittent  . Back pain   . Stroke [facility])     caused numbness in leg  . Chronic headaches   . GERD (gastroesophageal reflux disease)   . Shortness of breath dyspnea   . Depression   . Bipolar disorder ([place])   . Dementia   . Anxiety   . Neuromuscular disorder [facility])    Current Outpatient Prescriptions on File Prior to Visit  Medication Sig Dispense Refill  . acetaminophen (TYLENOL) 325 MG tablet Take 650 mg by mouth every 6 (six) hours as needed for moderate pain.    Marland Kitchen aspirin EC 81 MG tablet Take 1 tablet (81 mg total) by mouth daily. 90 tablet 3  . atenolol (TENORMIN) 25 MG tablet Take 1 tablet (25 mg total) by mouth daily. 30 tablet 3  . baclofen (LIORESAL) 10 MG tablet Take 1 tablet (10 mg total) by mouth at bedtime. 30 each 3  . buPROPion (WELLBUTRIN XL) 150 MG 24 hr tablet Take 150 mg by mouth daily.    . colchicine 0.6 MG tablet Take 1 tablet (0.6 mg total) by mouth 2 (two) times daily as needed. 60 tablet 0  . DULoxetine (CYMBALTA) 30 MG capsule Take 30 mg by mouth daily.    Marland Kitchen  gabapentin (NEURONTIN) 300 MG capsule TAKE 1 CAPSULE BY MOUTH 3 TIMES DAILY 90 capsule 2  . HYDROcodone-acetaminophen (NORCO) 5-325 MG tablet Take 2 tablets by mouth every 6 (six) hours as needed for moderate pain. 50 tablet 0  . HYDROcodone-acetaminophen (NORCO/VICODIN) 5-325 MG tablet Take 1 tablet by mouth every 6 (six) hours as needed (pain).    Marland Kitchen lurasidone (LATUDA) 20 MG TABS tablet Take 20 mg by mouth daily with breakfast.    . magnesium hydroxide (MILK OF MAGNESIA) 400 MG/5ML suspension Take 15 mLs by mouth at bedtime as needed for mild constipation. 360 mL 0  . omeprazole (PRILOSEC) 20 MG capsule Take 1 capsule (20 mg total) by mouth 2 (two) times daily before a meal. 60 capsule 2  . traZODone (DESYREL) 100 MG tablet Take 100 mg by mouth at bedtime.     No current facility-administered medications on file prior to visit.   Family History  Problem Relation Age of Onset  . Heart disease      No family history  . Diabetes Mother   . Hypertension Mother   . Colon cancer Neg Hx    Social History   Social History  . Marital Status: Divorced    Spouse Name: N/A  .  Number of Children: 5  . Years of Education: N/A   Occupational History  . Not on file.   Social History Main Topics  . Smoking status: Former Smoker    Quit date: 03/29/1985  . Smokeless tobacco: Never Used  . Alcohol Use: No     Comment: Used to drink heavily, no ETOH in "30-some years"  . Drug Use: No     Comment: History of crack, "no drugs in 30-some years"  . Sexual Activity: Not on file   Other Topics Concern  . Not on file   Social History Narrative    Review of Systems: Other than what is stated in HPI, all other systems are negative.   Objective:   Filed Vitals:   05/02/15 0931  BP: 131/91  Pulse: 89  Temp: 97.7 F (36.5 C)  Resp: 18    Physical Exam  Constitutional: She is oriented to person, place, and time.  Cardiovascular: Normal rate, regular rhythm and normal heart sounds.    Pulmonary/Chest: Effort normal and breath sounds normal. She exhibits tenderness (mild).  Abdominal: There is no tenderness.  Neurological: She is alert and oriented to person, place, and time.  Skin: Skin is warm and dry.  Psychiatric: She has a normal mood and affect.     Lab Results  Component Value Date   WBC 5.5 04/13/2015   HGB 13.5 04/13/2015   HCT 41.5 04/13/2015   MCV 91.2 04/13/2015   PLT 249 04/13/2015   Lab Results  Component Value Date   CREATININE 0.69 04/13/2015   BUN 9 04/13/2015   NA 138 04/13/2015   K 3.6 04/13/2015   CL 105 04/13/2015   CO2 25 04/13/2015    Lab Results  Component Value Date   HGBA1C 5.70 03/07/2014   Lipid Panel     Component Value Date/Time   CHOL 250* 03/07/2014 1005   TRIG 84 03/07/2014 1005   HDL 66 03/07/2014 1005   CHOLHDL 3.8 03/07/2014 1005   VLDL 17 03/07/2014 1005   LDLCALC 167* 03/07/2014 1005       Assessment and plan:   Roslin was seen today for chest pain and medication refill.  Diagnoses and all orders for this visit:  Chest pain, unspecified chest pain type?Prolonged QT -     DG Chest 2 View; Future -     EKG 12-Lead Chest xray reveals new QT prolongation. I have stressed the seriousness of this and I have advised patient to cut back on Trazodone dose to 50 mg at bedtime and I will recheck her EKG in 1 month. She is also on Cymbalta which can prolong the QT interval as well. She has a follow up appointment with [facility] next month. I have had her to sign a release of information form and I will send a copy of today's note and EKG to her psychiatrist for review. Hopefully they will take the EKG in consideration when making med changes. Explained signs and symptoms that should warrant immediate attention.  Patient verbalized understanding with teach back used.  URI (upper respiratory infection) -     Begin azithromycin (ZITHROMAX) 250 MG tablet; Take 2 pills today, then 1 pill each day after until complete I  will have her to complete a chest xray to make sure she does not have pneumonia. In the meantime I will start her on Z-pack.  Return if symptoms worsen or fail to improve.       [place]  [facility] [phone] 05/02/2015, 10:23 AM

## 2015-05-02 NOTE — Progress Notes (Signed)
Patient c/o chest pain.Patient states she's been dealing with a cold x1-2 weeks.  Patient c/o chest pain that's been going on 1-2 weeks,with the cough described as sharp, nagging pain, on and off rated 8/10.  Patient requesting med refill of baclofen.  Patient reports taken meds

## 2015-05-03 ENCOUNTER — Telehealth: Payer: Self-pay

## 2015-05-03 NOTE — Telephone Encounter (Signed)
Spoke with patient this am and she is aware her Chest x ray was normal

## 2015-05-03 NOTE — Telephone Encounter (Signed)
-----   Message from Lance Bosch, NP sent at 05/02/2015  4:09 PM EDT ----- Normal chest xray

## 2015-05-17 DIAGNOSIS — F323 Major depressive disorder, single episode, severe with psychotic features: Secondary | ICD-10-CM | POA: Diagnosis not present

## 2015-05-17 DIAGNOSIS — F331 Major depressive disorder, recurrent, moderate: Secondary | ICD-10-CM | POA: Diagnosis not present

## 2015-05-21 ENCOUNTER — Telehealth: Payer: Self-pay | Admitting: Gastroenterology

## 2015-05-21 NOTE — Telephone Encounter (Signed)
Please call pt. She had a polypoid lesion, removed and it was benign. HER stomach Bx shows mild gastritis.    CONTINUE YOUR WEIGHT LOSS EFFORTS. LOSE 10 POUNDS.  DRINK WATER TO KEEP YOUR URINE LIGHT YELLOW.  AVOID FOOD THAT TRIGGER REFLUX.   FOLLOW A HIGH FIBER/LOW FAT DIET. AVOID ITEMS THAT CAUSE BLOATING. MEATS SHOULD BE BAKED, BROILED, OR BOILED. AVOID FRIED FOODS.   CONTINUE ASPIRIN.   CONTINUE OMEPRAZOLE.  TAKE 30 MINUTES PRIOR TO YOURS MEALS TWICE DAILY.  Follow up in 4 mos E30 DYSPHAGIA/EPIGASTRIC PAIN/GERD.  Next colonoscopy in 10 years.

## 2015-05-22 NOTE — Telephone Encounter (Signed)
Pt is aware of results. 

## 2015-05-22 NOTE — Telephone Encounter (Signed)
Called pt and LMOM to call office back  

## 2015-05-22 NOTE — Telephone Encounter (Signed)
APPT MADE AND ON RECALL  °

## 2015-05-29 MED FILL — ATENOLOL 25 MG TABLET: 25 | 30 days supply | Qty: 30 | Fill #1

## 2015-05-29 MED FILL — OMEPRAZOLE DR 20 MG CAPSULE: 20 | 30 days supply | Qty: 60 | Fill #2

## 2015-05-29 MED FILL — GABAPENTIN 300 MG CAPSULE: 300 | 30 days supply | Qty: 90 | Fill #1

## 2015-05-30 ENCOUNTER — Ambulatory Visit: Payer: Medicare Other | Admitting: Family Medicine

## 2015-05-31 ENCOUNTER — Ambulatory Visit: Payer: Medicare Other | Admitting: Nurse Practitioner

## 2015-06-23 ENCOUNTER — Other Ambulatory Visit: Payer: Self-pay | Admitting: Nurse Practitioner

## 2015-06-23 MED FILL — ATENOLOL 25 MG TABLET: 25 | 30 days supply | Qty: 30 | Fill #2

## 2015-06-26 MED FILL — GABAPENTIN 300 MG CAPSULE: 300 | 30 days supply | Qty: 90 | Fill #2

## 2015-06-27 ENCOUNTER — Emergency Department (HOSPITAL_COMMUNITY)
Admission: EM | Admit: 2015-06-27 | Discharge: 2015-06-27 | Disposition: A | Discharge status: Home / Self Care | Payer: No Typology Code available for payment source | Attending: Emergency Medicine | Admitting: Emergency Medicine

## 2015-06-27 ENCOUNTER — Emergency Department (HOSPITAL_COMMUNITY): Payer: No Typology Code available for payment source

## 2015-06-27 ENCOUNTER — Telehealth: Payer: Self-pay | Admitting: Internal Medicine

## 2015-06-27 DIAGNOSIS — S39012A Strain of muscle, fascia and tendon of lower back, initial encounter: Secondary | ICD-10-CM | POA: Insufficient documentation

## 2015-06-27 DIAGNOSIS — G8929 Other chronic pain: Secondary | ICD-10-CM | POA: Insufficient documentation

## 2015-06-27 DIAGNOSIS — Z8673 Personal history of transient ischemic attack (TIA), and cerebral infarction without residual deficits: Secondary | ICD-10-CM | POA: Insufficient documentation

## 2015-06-27 DIAGNOSIS — S161XXA Strain of muscle, fascia and tendon at neck level, initial encounter: Secondary | ICD-10-CM | POA: Diagnosis not present

## 2015-06-27 DIAGNOSIS — Y998 Other external cause status: Secondary | ICD-10-CM | POA: Insufficient documentation

## 2015-06-27 DIAGNOSIS — K219 Gastro-esophageal reflux disease without esophagitis: Secondary | ICD-10-CM | POA: Insufficient documentation

## 2015-06-27 DIAGNOSIS — Y9389 Activity, other specified: Secondary | ICD-10-CM | POA: Diagnosis not present

## 2015-06-27 DIAGNOSIS — F319 Bipolar disorder, unspecified: Secondary | ICD-10-CM | POA: Insufficient documentation

## 2015-06-27 DIAGNOSIS — F419 Anxiety disorder, unspecified: Secondary | ICD-10-CM | POA: Diagnosis not present

## 2015-06-27 DIAGNOSIS — Z87891 Personal history of nicotine dependence: Secondary | ICD-10-CM | POA: Insufficient documentation

## 2015-06-27 DIAGNOSIS — M542 Cervicalgia: Secondary | ICD-10-CM | POA: Diagnosis not present

## 2015-06-27 DIAGNOSIS — F0390 Unspecified dementia without behavioral disturbance: Secondary | ICD-10-CM | POA: Insufficient documentation

## 2015-06-27 DIAGNOSIS — I10 Essential (primary) hypertension: Secondary | ICD-10-CM | POA: Diagnosis not present

## 2015-06-27 DIAGNOSIS — Z7982 Long term (current) use of aspirin: Secondary | ICD-10-CM | POA: Insufficient documentation

## 2015-06-27 DIAGNOSIS — Y92481 Parking lot as the place of occurrence of the external cause: Secondary | ICD-10-CM | POA: Diagnosis not present

## 2015-06-27 DIAGNOSIS — S3992XA Unspecified injury of lower back, initial encounter: Secondary | ICD-10-CM | POA: Diagnosis present

## 2015-06-27 DIAGNOSIS — S199XXA Unspecified injury of neck, initial encounter: Secondary | ICD-10-CM | POA: Diagnosis not present

## 2015-06-27 DIAGNOSIS — Z79899 Other long term (current) drug therapy: Secondary | ICD-10-CM | POA: Insufficient documentation

## 2015-06-27 MED ORDER — CYCLOBENZAPRINE HCL 10 MG PO TABS: 10.0000 mg | ORAL_TABLET | Freq: Two times a day (BID) | ORAL | Status: DC | PRN

## 2015-06-27 MED ORDER — ACETAMINOPHEN 325 MG PO TABS
650.0000 mg | ORAL_TABLET | Freq: Once | ORAL | Status: AC
  Administered 2015-06-27: 650 mg via ORAL
  Filled 2015-06-27: qty 2

## 2015-06-27 NOTE — Telephone Encounter (Signed)
Pt. 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 Please f/u with pt.
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
## 2015-06-27 NOTE — ED Notes (Signed)
Pt is in stable condition upon d/c and ambulates from ED. 
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
## 2015-06-27 NOTE — Discharge Instructions (Signed)
Take the medication as directed. Do not drive while taking it as it will make you sleepy. Follow up with your doctor or with the orthopedic doctor if symptoms persist. Return here as needed.

## 2015-06-30 ENCOUNTER — Ambulatory Visit: Payer: Medicare Other | Attending: Internal Medicine | Admitting: Physician Assistant

## 2015-06-30 DIAGNOSIS — M62838 Other muscle spasm: Secondary | ICD-10-CM | POA: Diagnosis not present
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
## 2015-06-30 NOTE — Patient Instructions (Signed)
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
## 2015-07-10 ENCOUNTER — Encounter: Payer: Self-pay | Admitting: Gastroenterology

## 2015-07-26 MED FILL — GABAPENTIN 300 MG CAPSULE: 300 | 30 days supply | Qty: 90 | Fill #3

## 2015-07-26 MED FILL — ATENOLOL 25 MG TABLET: 25 | 30 days supply | Qty: 30 | Fill #3

## 2015-08-01 ENCOUNTER — Telehealth: Payer: Self-pay | Admitting: *Deleted

## 2015-08-01 NOTE — Telephone Encounter (Signed)
Medical Assistant left message on patient's home and cell voicemail. Voicemail states to give a call back to [place] with [facility] at [phone].  !!!Patient is able to engage in swimming lessons!!!

## 2015-08-15 ENCOUNTER — Telehealth: Payer: Self-pay | Admitting: Internal Medicine

## 2015-08-15 DIAGNOSIS — I10 Essential (primary) hypertension: Secondary | ICD-10-CM

## 2015-08-15 DIAGNOSIS — F331 Major depressive disorder, recurrent, moderate: Secondary | ICD-10-CM | POA: Diagnosis not present

## 2015-08-15 DIAGNOSIS — F323 Major depressive disorder, single episode, severe with psychotic features: Secondary | ICD-10-CM | POA: Diagnosis not present

## 2015-08-15 MED ORDER — ATENOLOL 25 MG PO TABS: 25.0000 mg | ORAL_TABLET | Freq: Every day | ORAL | Status: DC

## 2015-08-15 MED ORDER — OMEPRAZOLE 20 MG PO CPDR: DELAYED_RELEASE_CAPSULE | ORAL | Status: DC

## 2015-08-15 MED ORDER — CYCLOBENZAPRINE HCL 10 MG PO TABS: 10.0000 mg | ORAL_TABLET | Freq: Two times a day (BID) | ORAL | Status: DC | PRN

## 2015-08-15 MED FILL — OMEPRAZOLE DR 20 MG CAPSULE: 20 | 30 days supply | Qty: 60 | Fill #0

## 2015-08-15 MED FILL — CYCLOBENZAPRINE 10 MG TAB: 10 | 10 days supply | Qty: 20 | Fill #0

## 2015-08-15 MED FILL — ATENOLOL 25 MG TABLET: 25 | 30 days supply | Qty: 30 | Fill #0

## 2015-08-15 NOTE — Telephone Encounter (Signed)
Medications refilled

## 2015-08-15 NOTE — Telephone Encounter (Signed)
Patient needs flexeril, atenolol and medication for stomach ulcer. Please follow up.

## 2015-08-18 ENCOUNTER — Ambulatory Visit: Payer: Medicare Other | Admitting: Gastroenterology

## 2015-08-22 ENCOUNTER — Ambulatory Visit: Payer: Medicare Other | Admitting: Gastroenterology

## 2015-08-23 ENCOUNTER — Other Ambulatory Visit: Payer: Self-pay | Admitting: Internal Medicine

## 2015-08-23 NOTE — Telephone Encounter (Signed)
Requesting gabapentin

## 2015-08-24 MED FILL — GABAPENTIN 300 MG CAPSULE: 300 | 30 days supply | Qty: 90 | Fill #0

## 2015-09-04 ENCOUNTER — Ambulatory Visit: Payer: Medicare Other | Admitting: Internal Medicine

## 2015-09-15 MED FILL — ATENOLOL 25 MG TABLET: 25 | 30 days supply | Qty: 30 | Fill #1

## 2015-09-15 MED FILL — OMEPRAZOLE DR 20 MG CAPSULE: 20 | 30 days supply | Qty: 60 | Fill #1

## 2015-09-21 MED FILL — GABAPENTIN 300 MG CAPSULE: 300 | 30 days supply | Qty: 90 | Fill #1

## 2015-10-04 ENCOUNTER — Emergency Department (HOSPITAL_COMMUNITY)
Admission: EM | Admit: 2015-10-04 | Discharge: 2015-10-04 | Disposition: A | Discharge status: Home / Self Care | Payer: Commercial Managed Care - HMO | Attending: Emergency Medicine | Admitting: Emergency Medicine

## 2015-10-04 ENCOUNTER — Encounter (HOSPITAL_COMMUNITY): Payer: Self-pay

## 2015-10-04 ENCOUNTER — Emergency Department (HOSPITAL_COMMUNITY): Payer: Commercial Managed Care - HMO

## 2015-10-04 DIAGNOSIS — W1830XA Fall on same level, unspecified, initial encounter: Secondary | ICD-10-CM | POA: Diagnosis not present

## 2015-10-04 DIAGNOSIS — Z8673 Personal history of transient ischemic attack (TIA), and cerebral infarction without residual deficits: Secondary | ICD-10-CM | POA: Insufficient documentation

## 2015-10-04 DIAGNOSIS — M545 Low back pain, unspecified: Secondary | ICD-10-CM

## 2015-10-04 DIAGNOSIS — M62830 Muscle spasm of back: Secondary | ICD-10-CM | POA: Diagnosis not present

## 2015-10-04 DIAGNOSIS — I10 Essential (primary) hypertension: Secondary | ICD-10-CM | POA: Insufficient documentation

## 2015-10-04 DIAGNOSIS — M546 Pain in thoracic spine: Secondary | ICD-10-CM | POA: Diagnosis not present

## 2015-10-04 DIAGNOSIS — Z87891 Personal history of nicotine dependence: Secondary | ICD-10-CM | POA: Diagnosis not present

## 2015-10-04 DIAGNOSIS — Z79899 Other long term (current) drug therapy: Secondary | ICD-10-CM | POA: Insufficient documentation

## 2015-10-04 DIAGNOSIS — Y939 Activity, unspecified: Secondary | ICD-10-CM | POA: Insufficient documentation

## 2015-10-04 DIAGNOSIS — Y999 Unspecified external cause status: Secondary | ICD-10-CM | POA: Diagnosis not present

## 2015-10-04 DIAGNOSIS — M549 Dorsalgia, unspecified: Secondary | ICD-10-CM

## 2015-10-04 DIAGNOSIS — Z7982 Long term (current) use of aspirin: Secondary | ICD-10-CM | POA: Insufficient documentation

## 2015-10-04 DIAGNOSIS — M62838 Other muscle spasm: Secondary | ICD-10-CM

## 2015-10-04 DIAGNOSIS — Y929 Unspecified place or not applicable: Secondary | ICD-10-CM | POA: Diagnosis not present

## 2015-10-04 DIAGNOSIS — S3992XA Unspecified injury of lower back, initial encounter: Secondary | ICD-10-CM | POA: Diagnosis not present

## 2015-10-04 LAB — CBC WITH DIFFERENTIAL/PLATELET
Basophils Absolute: 0 K/uL (ref 0.0–0.1)
Basophils Relative: 1 %
Eosinophils Absolute: 0.1 K/uL (ref 0.0–0.7)
Eosinophils Relative: 2 %
HCT: 42.7 % (ref 36.0–46.0)
Hemoglobin: 14 g/dL (ref 12.0–15.0)
Lymphocytes Relative: 34 %
Lymphs Abs: 1.6 K/uL (ref 0.7–4.0)
MCH: 30.1 pg (ref 26.0–34.0)
MCHC: 32.8 g/dL (ref 30.0–36.0)
MCV: 91.8 fL (ref 78.0–100.0)
Monocytes Absolute: 0.4 K/uL (ref 0.1–1.0)
Monocytes Relative: 9 %
Neutro Abs: 2.6 K/uL (ref 1.7–7.7)
Neutrophils Relative %: 54 %
Platelets: 212 K/uL (ref 150–400)
RBC: 4.65 MIL/uL (ref 3.87–5.11)
RDW: 13.1 % (ref 11.5–15.5)
WBC: 4.8 K/uL (ref 4.0–10.5)

## 2015-10-04 LAB — BASIC METABOLIC PANEL WITH GFR
Anion gap: 6 (ref 5–15)
BUN: <5 mg/dL — ABNORMAL LOW (ref 6–20)
CO2: 28 mmol/L (ref 22–32)
Calcium: 9.5 mg/dL (ref 8.9–10.3)
Chloride: 106 mmol/L (ref 101–111)
Creatinine, Ser: 0.63 mg/dL (ref 0.44–1.00)
GFR calc Af Amer: >60 mL/min
GFR calc non Af Amer: >60 mL/min
Glucose, Bld: 85 mg/dL (ref 65–99)
Potassium: 3.4 mmol/L — ABNORMAL LOW (ref 3.5–5.1)
Sodium: 140 mmol/L (ref 135–145)

## 2015-10-04 LAB — CK: Total CK: 138 U/L (ref 38–234)

## 2015-10-04 MED ORDER — ACETAMINOPHEN-CODEINE #3 300-30 MG PO TABS: 1.0000 | ORAL_TABLET | Freq: Four times a day (QID) | ORAL | 0 refills | Status: DC | PRN

## 2015-10-04 MED ORDER — FENTANYL CITRATE (PF) 100 MCG/2ML IJ SOLN
50.0000 ug | Freq: Once | INTRAMUSCULAR | Status: AC
  Administered 2015-10-04: 50 ug via INTRAVENOUS
  Filled 2015-10-04: qty 2

## 2015-10-04 MED ORDER — CYCLOBENZAPRINE HCL 10 MG PO TABS: 10.0000 mg | ORAL_TABLET | Freq: Two times a day (BID) | ORAL | 0 refills | Status: DC | PRN

## 2015-10-04 NOTE — ED Triage Notes (Signed)
Per Pt, Pt was sitting yesterday when her back started cramping and she started to notice bilateral cramping in her arms. Pt reports having fallen a couple times last week.Pt reports urinating on herself since the fall. Pt was able to ambulate to the room with walker.

## 2015-10-04 NOTE — Discharge Instructions (Signed)
Take Tylenol 3 and Flexeril as needed for pain and muscle spasms. Follow-up with Dr. Lorin Mercy orthopedic surgery for reevaluation of your back. He may also follow up with her PCP for additional pain management. Return to the ED if you experience inability to walk or lift her legs, fevers, chills, loss control of bowel or bladder, numbness or tingling in both of the lower extremities.
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
## 2015-10-04 NOTE — ED Notes (Signed)
Pt stable, ambulatory, states understanding of discharge instructions 

## 2015-10-16 ENCOUNTER — Other Ambulatory Visit: Payer: Self-pay | Admitting: Pharmacist

## 2015-10-16 MED ORDER — METOPROLOL SUCCINATE ER 25 MG PO TB24: 12.5000 mg | ORAL_TABLET | Freq: Every day | ORAL | 2 refills | Status: DC

## 2015-10-16 MED FILL — OMEPRAZOLE DR 20 MG CAPSULE: 20 | 30 days supply | Qty: 60 | Fill #2

## 2015-10-16 NOTE — Telephone Encounter (Signed)
Patient on atenolol 25 mg daily and atenolol is on national backorder. Switched to equivalent dose of metoprolol succinate.

## 2015-10-17 MED FILL — METOPROLOL SUCC ER 25 MG TA: 25 | 30 days supply | Qty: 15 | Fill #0

## 2015-10-24 MED FILL — GABAPENTIN 300 MG CAPSULE: 300 | 30 days supply | Qty: 90 | Fill #2

## 2015-11-07 ENCOUNTER — Ambulatory Visit: Payer: Commercial Managed Care - HMO | Attending: Internal Medicine | Admitting: Internal Medicine

## 2015-11-07 ENCOUNTER — Encounter: Payer: Self-pay | Admitting: Internal Medicine

## 2015-11-07 VITALS — BP 128/95 | HR 64 | Temp 98.1°F | Resp 16 | Wt 245.8 lb

## 2015-11-07 DIAGNOSIS — M545 Low back pain, unspecified: Secondary | ICD-10-CM

## 2015-11-07 DIAGNOSIS — Z79899 Other long term (current) drug therapy: Secondary | ICD-10-CM | POA: Diagnosis not present

## 2015-11-07 DIAGNOSIS — M79604 Pain in right leg: Secondary | ICD-10-CM | POA: Insufficient documentation

## 2015-11-07 DIAGNOSIS — F323 Major depressive disorder, single episode, severe with psychotic features: Secondary | ICD-10-CM | POA: Diagnosis not present

## 2015-11-07 DIAGNOSIS — Z23 Encounter for immunization: Secondary | ICD-10-CM

## 2015-11-07 DIAGNOSIS — M5126 Other intervertebral disc displacement, lumbar region: Secondary | ICD-10-CM

## 2015-11-07 DIAGNOSIS — Z7982 Long term (current) use of aspirin: Secondary | ICD-10-CM | POA: Insufficient documentation

## 2015-11-07 MED ORDER — PREDNISONE 20 MG PO TABS: 60.0000 mg | ORAL_TABLET | Freq: Every day | ORAL | 0 refills | Status: DC

## 2015-11-07 MED ORDER — GABAPENTIN 300 MG PO CAPS: 300.0000 mg | ORAL_CAPSULE | Freq: Three times a day (TID) | ORAL | 2 refills | Status: DC

## 2015-11-07 MED ORDER — ACETAMINOPHEN-CODEINE #3 300-30 MG PO TABS: 1.0000 | ORAL_TABLET | Freq: Four times a day (QID) | ORAL | 0 refills | Status: DC | PRN

## 2015-11-07 MED ORDER — CYCLOBENZAPRINE HCL 10 MG PO TABS: 10.0000 mg | ORAL_TABLET | Freq: Two times a day (BID) | ORAL | 0 refills | Status: DC | PRN

## 2015-11-07 MED FILL — CYCLOBENZAPRINE 10 MG TAB: 10 | 10 days supply | Qty: 20 | Fill #0

## 2015-11-07 MED FILL — ACETAMINOPHEN/COD #3 TABLET: 300-30 | 6 days supply | Qty: 45 | Fill #0

## 2015-11-07 MED FILL — predniSONE 20 MG TABS: 20 | 10 days supply | Qty: 20 | Fill #0

## 2015-11-07 NOTE — Patient Instructions (Signed)

## 2015-11-07 NOTE — Progress Notes (Signed)
Pt is in the office today for back pain
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
## 2015-11-15 MED FILL — METOPROLOL SUCC ER 25 MG TA: 25 | 30 days supply | Qty: 15 | Fill #1

## 2015-11-29 MED FILL — OMEPRAZOLE DR 20 MG CAPSULE: 20 | 30 days supply | Qty: 60 | Fill #0

## 2015-11-29 MED FILL — GABAPENTIN 300 MG CAPSULE: 300 | 30 days supply | Qty: 90 | Fill #0

## 2015-12-15 MED FILL — METOPROLOL SUCC ER 25 MG TA: 25 | 30 days supply | Qty: 15 | Fill #2

## 2015-12-22 ENCOUNTER — Emergency Department (HOSPITAL_COMMUNITY)
Admission: EM | Admit: 2015-12-22 | Discharge: 2015-12-22 | Disposition: A | Discharge status: Home / Self Care | Payer: Commercial Managed Care - HMO | Attending: Emergency Medicine | Admitting: Emergency Medicine

## 2015-12-22 ENCOUNTER — Encounter (HOSPITAL_COMMUNITY): Payer: Self-pay

## 2015-12-22 DIAGNOSIS — Z7982 Long term (current) use of aspirin: Secondary | ICD-10-CM | POA: Insufficient documentation

## 2015-12-22 DIAGNOSIS — I10 Essential (primary) hypertension: Secondary | ICD-10-CM | POA: Diagnosis not present

## 2015-12-22 DIAGNOSIS — Z8673 Personal history of transient ischemic attack (TIA), and cerebral infarction without residual deficits: Secondary | ICD-10-CM | POA: Insufficient documentation

## 2015-12-22 DIAGNOSIS — R319 Hematuria, unspecified: Secondary | ICD-10-CM | POA: Diagnosis not present

## 2015-12-22 DIAGNOSIS — N939 Abnormal uterine and vaginal bleeding, unspecified: Secondary | ICD-10-CM | POA: Diagnosis present

## 2015-12-22 DIAGNOSIS — R310 Gross hematuria: Secondary | ICD-10-CM | POA: Insufficient documentation

## 2015-12-22 DIAGNOSIS — Z87891 Personal history of nicotine dependence: Secondary | ICD-10-CM | POA: Diagnosis not present

## 2015-12-22 DIAGNOSIS — Z79899 Other long term (current) drug therapy: Secondary | ICD-10-CM | POA: Diagnosis not present

## 2015-12-22 LAB — RAPID URINE DRUG SCREEN, HOSP PERFORMED
Amphetamines: NOT DETECTED
BARBITURATES: NOT DETECTED
BENZODIAZEPINES: NOT DETECTED
Cocaine: NOT DETECTED
Opiates: NOT DETECTED
Tetrahydrocannabinol: NOT DETECTED

## 2015-12-22 LAB — URINALYSIS, ROUTINE W REFLEX MICROSCOPIC
Bilirubin Urine: NEGATIVE
GLUCOSE, UA: NEGATIVE mg/dL
KETONES UR: NEGATIVE mg/dL
Nitrite: NEGATIVE
PH: 7 (ref 5.0–8.0)
Protein, ur: >300 mg/dL — AB
SPECIFIC GRAVITY, URINE: 1.015 (ref 1.005–1.030)

## 2015-12-22 LAB — URINE MICROSCOPIC-ADD ON

## 2015-12-22 LAB — WET PREP, GENITAL
Sperm: NONE SEEN
Trich, Wet Prep: NONE SEEN
YEAST WET PREP: NONE SEEN

## 2015-12-22 MED ORDER — CEFTRIAXONE SODIUM 1 G IJ SOLR
1.0000 g | Freq: Once | INTRAMUSCULAR | Status: AC
  Administered 2015-12-22: 1 g via INTRAMUSCULAR
  Filled 2015-12-22: qty 10

## 2015-12-22 MED ORDER — LIDOCAINE HCL (PF) 1 % IJ SOLN
INTRAMUSCULAR | Status: AC
  Administered 2015-12-22: 5 mL
  Filled 2015-12-22: qty 5

## 2015-12-22 MED ORDER — CEFTRIAXONE SODIUM 1 G IJ SOLR: 1.0000 g | Freq: Once | INTRAMUSCULAR | Status: DC

## 2015-12-22 MED ORDER — CEPHALEXIN 500 MG PO CAPS: 500.0000 mg | ORAL_CAPSULE | Freq: Four times a day (QID) | ORAL | 0 refills | Status: DC

## 2015-12-22 NOTE — ED Notes (Signed)
Pt. Walked to restroom independently with walker at this time.

## 2015-12-22 NOTE — ED Notes (Signed)
PA student at bedside.

## 2015-12-22 NOTE — ED Provider Notes (Signed)
[facility] DEPT Provider Note   CSN: [id number] Arrival date & time: 12/22/15  [id number]     History   Chief Complaint Chief Complaint  Patient presents with  . Vaginal Bleeding  . Leg Pain    HPI Lorraine Ryan is a 60 y.o. femalec/o vaginal bleeding this am at 6:20  has a past medical history of Anxiety; Back pain; Bipolar disorder ([place]); Chronic headaches; Dementia; Depression; GERD (gastroesophageal reflux disease); Hypertension; Neuromuscular disorder ([place]); Shortness of breath dyspnea; and Stroke ([place]). Patient is a poor historian. History is limited by the mental capacity of the patient, patient's ability to communicate effectively, and overall poor insight.  Went to go to breathing had sharp pain and persistent pressure in the pelvis. Feels the pain is persistent. Similar episode last week. Sharp pain is resolved. NO hx of kidney stones Denies dysuria, flank pain, suprapubic pain, frequency, urgency, or hematuria.   Denies n/v/diarrhea constipation melena or hematochezia.  HPI  Past Medical History:  Diagnosis Date  . Anxiety   . Back pain   . Bipolar disorder ([place])   . Chronic headaches   . Dementia   . Depression   . GERD (gastroesophageal reflux disease)   . Hypertension    No meds prescribed; states intermittent  . Neuromuscular disorder ([place])   . Shortness of breath dyspnea   . Stroke [facility])    caused numbness in leg    Patient Active Problem List   Diagnosis Date Noted  . Abdominal pain, epigastric 03/30/2015  . Encounter for screening colonoscopy 03/30/2015  . Dysphagia 03/30/2015  . History of lumbar laminectomy for spinal cord decompression 02/08/2015  . Orthostatic hypotension 04/20/2014  . Hyperlipidemia 04/20/2014  . Low back pain radiating to right leg 03/01/2013  . Numbness in right leg 03/01/2013  . Chest pain 03/01/2013  . Hypertension   . Back pain   . Stroke [facility])     Past Surgical History:  Procedure Laterality Date  . ABDOMINAL  HYSTERECTOMY    . COLONOSCOPY WITH PROPOFOL N/A 04/18/2015   [id number] hemorrhoids/diverticulosis sigmoid colon/  . ESOPHAGOGASTRODUODENOSCOPY (EGD) WITH PROPOFOL N/A 04/18/2015   SLF:web in the proximal esophagus/dilated/  . GANGLION CYST EXCISION    . LUMBAR LAMINECTOMY/DECOMPRESSION MICRODISCECTOMY N/A 02/08/2015   Procedure: L4-5 Decompression;  Surgeon: Marybelle Killings, MD;  Location: [place];  Service: Orthopedics;  Laterality: N/A;  . POLYPECTOMY  04/18/2015   Procedure: POLYPECTOMY;  Surgeon: Danie Binder, MD;  [facility];  Service: Endoscopy;;  ascending colon polyp  . TONSILLECTOMY      OB History    No data available       Home Medications    Prior to Admission medications   Medication Sig Start Date End Date Taking? Authorizing Provider  acetaminophen (TYLENOL) 325 MG tablet Take 650 mg by mouth every 6 (six) hours as needed for mild pain.   Yes Historical Provider, MD  Aspirin-Salicylamide-Caffeine (BC HEADACHE POWDER PO) Take 1 packet by mouth daily as needed (headache).   Yes Historical Provider, MD  atenolol (TENORMIN) 25 MG tablet Take 1 tablet (25 mg total) by mouth daily. 08/15/15  Yes Tresa Garter, MD  DULoxetine (CYMBALTA) 30 MG capsule Take 30 mg by mouth daily.   Yes Historical Provider, MD  gabapentin (NEURONTIN) 300 MG capsule Take 1 capsule (300 mg total) by mouth 3 (three) times daily. 11/07/15  Yes Maren Reamer, MD  lurasidone (LATUDA) 20 MG TABS tablet Take 20 mg by mouth every  evening.    Yes Historical Provider, MD  metoprolol succinate (TOPROL-XL) 25 MG 24 hr tablet Take 0.5 tablets (12.5 mg total) by mouth daily. 10/16/15  Yes Tresa Garter, MD  omeprazole (PRILOSEC) 20 MG capsule TAKE ONE CAPSULE BY MOUTH TWICE A DAY BEFORE A MEAL 08/15/15  Yes Tresa Garter, MD  traZODone (DESYREL) 100 MG tablet Take 100 mg by mouth at bedtime.   Yes Historical Provider, MD  acetaminophen-codeine (TYLENOL #3) 300-30 MG tablet Take 1-2  tablets by mouth every 6 (six) hours as needed for moderate pain. Patient not taking: Reported on 12/22/2015 11/07/15   Maren Reamer, MD  aspirin EC 81 MG tablet Take 1 tablet (81 mg total) by mouth daily. Patient not taking: Reported on 12/22/2015 03/01/13   Lelon Perla, MD  azithromycin (ZITHROMAX) 250 MG tablet Take 2 pills today, then 1 pill each day after until complete Patient not taking: Reported on 12/22/2015 05/02/15   Lance Bosch, NP  baclofen (LIORESAL) 10 MG tablet Take 1 tablet (10 mg total) by mouth at bedtime. Patient not taking: Reported on 12/22/2015 11/28/14   Lance Bosch, NP  cephALEXin (KEFLEX) 500 MG capsule Take 1 capsule (500 mg total) by mouth 4 (four) times daily. 12/22/15   Margarita Mail, PA-C  colchicine 0.6 MG tablet Take 1 tablet (0.6 mg total) by mouth 2 (two) times daily as needed. Patient not taking: Reported on 12/22/2015 05/27/13   Theodis Blaze, MD  cyclobenzaprine (FLEXERIL) 10 MG tablet Take 1 tablet (10 mg total) by mouth 2 (two) times daily as needed for muscle spasms. Patient not taking: Reported on 12/22/2015 11/07/15   Maren Reamer, MD  HYDROcodone-acetaminophen (NORCO) 5-325 MG tablet Take 2 tablets by mouth every 6 (six) hours as needed for moderate pain. Patient not taking: Reported on 12/22/2015 02/09/15   Marybelle Killings, MD  magnesium hydroxide (MILK OF MAGNESIA) 400 MG/5ML suspension Take 15 mLs by mouth at bedtime as needed for mild constipation. Patient not taking: Reported on 12/22/2015 08/10/14   [name], MD  predniSONE (DELTASONE) 20 MG tablet Take 3 tablets (60 mg total) by mouth daily with breakfast. Day 1-3 take 3 tabs (=60mg ) qam, day 4-7 take 2 tabs (=40mg ) qam, day 8-10 take 1 tab (=20mg ) qam. Patient not taking: Reported on 12/22/2015 11/07/15   Maren Reamer, MD    Family History Family History  Problem Relation Age of Onset  . Diabetes Mother   . Hypertension Mother   . Heart disease      No family history  .  Colon cancer Neg Hx     Social History Social History  Substance Use Topics  . Smoking status: Former Smoker    Quit date: 03/29/1985  . Smokeless tobacco: Never Used  . Alcohol use No     Comment: Used to drink heavily, no ETOH in "30-some years"     Allergies   Codeine; Oxycodone; Prednisone; and Tramadol   Review of Systems Review of Systems   Physical Exam Updated Vital Signs BP 130/87 (BP Location: Right Arm)   Pulse 77   Temp 98.9 F (37.2 C) (Oral)   Resp 24   Ht 5\' 6"  (1.676 m)   Wt 108.9 kg   SpO2 100%   BMI 38.74 kg/m   Physical Exam  Constitutional: She is oriented to person, place, and time. She appears well-developed and well-nourished. No distress.  HENT:  Head: Normocephalic and atraumatic.  Eyes: Conjunctivae  are normal. No scleral icterus.  Neck: Normal range of motion.  Cardiovascular: Normal rate, regular rhythm and normal heart sounds.  Exam reveals no gallop and no friction rub.   No murmur heard. Pulmonary/Chest: Effort normal and breath sounds normal. No respiratory distress.  Abdominal: Soft. Bowel sounds are normal. She exhibits no distension and no mass. There is no tenderness. There is no guarding.  Genitourinary:  Genitourinary Comments: Pelvic exam: VULVA: normal appearing vulva with no masses, tenderness or lesions, dried blood present at the apex of the vulva, VAGINA: normal appearing vagina with normal color and discharge, no lesions, CERVIX: surgically absent, exam chaperoned by Kennith Maes, PA-S.  Neurological: She is alert and oriented to person, place, and time.  Skin: Skin is warm and dry. She is not diaphoretic.     ED Treatments / Results  Labs (all labs ordered are listed, but only abnormal results are displayed) Labs Reviewed  WET PREP, GENITAL - Abnormal; Notable for the following:       Result Value   Clue Cells Wet Prep HPF POC PRESENT (*)    WBC, Wet Prep HPF POC MANY (*)    All other components within  normal limits  URINALYSIS, ROUTINE W REFLEX MICROSCOPIC (NOT AT [facility]) - Abnormal; Notable for the following:    APPearance CLOUDY (*)    Hgb urine dipstick LARGE (*)    Protein, ur >300 (*)    Leukocytes, UA SMALL (*)    All other components within normal limits  URINE MICROSCOPIC-ADD ON - Abnormal; Notable for the following:    Squamous Epithelial / LPF 0-5 (*)    Bacteria, UA RARE (*)    All other components within normal limits  URINE CULTURE  RAPID URINE DRUG SCREEN, HOSP PERFORMED    EKG  EKG Interpretation None       Radiology No results found.  Procedures Procedures (including critical care time)  Medications Ordered in ED Medications  cefTRIAXone (ROCEPHIN) injection 1 g (1 g Intramuscular Given 12/22/15 1014)  lidocaine (PF) (XYLOCAINE) 1 % injection (5 mLs  Given 12/22/15 1014)     Initial Impression / Assessment and Plan / ED Course  I have reviewed the triage vital signs and the nursing notes.  Pertinent labs & imaging results that were available during my care of the patient were reviewed by me and considered in my medical decision making (see chart for details).  Clinical Course     Patient with hematuria.  Will treat for uti given Pelvic pain. No evidence of bleeding from the vagina, nontender, nonfriable. Patient will be given keflex at discharge. F/U with pcp/urology  Final Clinical Impressions(s) / ED Diagnoses   Final diagnoses:  Hematuria, unspecified type  Gross hematuria    New Prescriptions Discharge Medication List as of 12/22/2015 10:26 AM    START taking these medications   Details  cephALEXin (KEFLEX) 500 MG capsule Take 1 capsule (500 mg total) by mouth 4 (four) times daily., Starting Fri 12/22/2015, Print         Margarita Mail, PA-C 12/22/15 [place], MD 12/23/15 [phone]

## 2015-12-22 NOTE — ED Notes (Signed)
EDP at bedside  

## 2015-12-22 NOTE — ED Triage Notes (Signed)
Pt. C/o vaginal bleeding starting this morning. Pt. Hx of hysterectomy. Pt. Also sts the right leg keeps 'inflating' and the pain comes and goes. Pt. Using a walker and describes the leg pain as chronic from previous stroke.

## 2015-12-22 NOTE — ED Notes (Signed)
Pelvic cart set up at bedside  

## 2015-12-22 NOTE — Discharge Instructions (Signed)
Get help right away if: You develop severe vomiting and are unable to keep the medicine down. You develop severe back or abdominal pain despite taking your medicines. You begin passing a large amount of blood or clots in your urine. You feel extremely weak or faint, or you pass out.

## 2015-12-24 LAB — URINE CULTURE: Culture: 20000 — AB

## 2015-12-25 ENCOUNTER — Telehealth (HOSPITAL_BASED_OUTPATIENT_CLINIC_OR_DEPARTMENT_OTHER): Payer: Self-pay | Admitting: Emergency Medicine

## 2015-12-25 NOTE — Telephone Encounter (Signed)
Post ED Visit - Positive Culture Follow-up  Culture report reviewed by antimicrobial stewardship pharmacist:  []  Elenor Quinones, Pharm.D. []  Heide Guile, Pharm.D., BCPS []  Parks Neptune, Pharm.D. []  Alycia Rossetti, Pharm.D., BCPS []  [place], Pharm.D., BCPS, AAHIVP []  Legrand Como, Pharm.D., BCPS, AAHIVP []  Milus Glazier, Pharm.D. []  Rob [place], [place].D. Apryl Ouida [place] PharmD  Positive urine culture Treated with cephalexin, organism sensitive to the same and no further patient follow-up is required at this time.  Hazle Nordmann 12/25/2015, 9:43 AM

## 2015-12-26 MED FILL — GABAPENTIN 300 MG CAPSULE: 300 | 30 days supply | Qty: 90 | Fill #1

## 2015-12-26 MED FILL — OMEPRAZOLE DR 20 MG CAPSULE: 20 | 30 days supply | Qty: 60 | Fill #1

## 2016-01-01 ENCOUNTER — Other Ambulatory Visit: Payer: Self-pay | Admitting: Internal Medicine

## 2016-01-08 ENCOUNTER — Encounter: Payer: Self-pay | Admitting: Internal Medicine

## 2016-01-08 DIAGNOSIS — F419 Anxiety disorder, unspecified: Secondary | ICD-10-CM | POA: Insufficient documentation

## 2016-01-08 DIAGNOSIS — F329 Major depressive disorder, single episode, unspecified: Secondary | ICD-10-CM | POA: Insufficient documentation

## 2016-01-08 DIAGNOSIS — F32A Depression, unspecified: Secondary | ICD-10-CM | POA: Insufficient documentation

## 2016-01-12 ENCOUNTER — Ambulatory Visit: Payer: Commercial Managed Care - HMO | Attending: Internal Medicine | Admitting: Physician Assistant

## 2016-01-12 ENCOUNTER — Encounter: Payer: Self-pay | Admitting: Physician Assistant

## 2016-01-12 VITALS — BP 144/87 | HR 72 | Temp 98.1°F | Resp 16 | Wt 242.8 lb

## 2016-01-12 DIAGNOSIS — Z8673 Personal history of transient ischemic attack (TIA), and cerebral infarction without residual deficits: Secondary | ICD-10-CM | POA: Diagnosis not present

## 2016-01-12 DIAGNOSIS — F419 Anxiety disorder, unspecified: Secondary | ICD-10-CM | POA: Insufficient documentation

## 2016-01-12 DIAGNOSIS — Z888 Allergy status to other drugs, medicaments and biological substances status: Secondary | ICD-10-CM | POA: Diagnosis not present

## 2016-01-12 DIAGNOSIS — M5441 Lumbago with sciatica, right side: Secondary | ICD-10-CM | POA: Diagnosis not present

## 2016-01-12 DIAGNOSIS — I10 Essential (primary) hypertension: Secondary | ICD-10-CM | POA: Diagnosis not present

## 2016-01-12 DIAGNOSIS — Z885 Allergy status to narcotic agent status: Secondary | ICD-10-CM | POA: Diagnosis not present

## 2016-01-12 DIAGNOSIS — F319 Bipolar disorder, unspecified: Secondary | ICD-10-CM | POA: Insufficient documentation

## 2016-01-12 DIAGNOSIS — R319 Hematuria, unspecified: Secondary | ICD-10-CM | POA: Insufficient documentation

## 2016-01-12 DIAGNOSIS — F0390 Unspecified dementia without behavioral disturbance: Secondary | ICD-10-CM | POA: Insufficient documentation

## 2016-01-12 DIAGNOSIS — Z79899 Other long term (current) drug therapy: Secondary | ICD-10-CM | POA: Diagnosis not present

## 2016-01-12 DIAGNOSIS — K219 Gastro-esophageal reflux disease without esophagitis: Secondary | ICD-10-CM | POA: Insufficient documentation

## 2016-01-12 DIAGNOSIS — Z87440 Personal history of urinary (tract) infections: Secondary | ICD-10-CM | POA: Diagnosis not present

## 2016-01-12 DIAGNOSIS — M545 Low back pain: Secondary | ICD-10-CM | POA: Diagnosis present

## 2016-01-12 LAB — POCT URINALYSIS DIPSTICK
BILIRUBIN UA: NEGATIVE
GLUCOSE UA: NEGATIVE
KETONES UA: NEGATIVE
Leukocytes, UA: NEGATIVE
Nitrite, UA: NEGATIVE
Protein, UA: NEGATIVE
SPEC GRAV UA: 1.01
Urobilinogen, UA: 1
pH, UA: 6.5

## 2016-01-12 MED ORDER — METOPROLOL SUCCINATE ER 25 MG PO TB24: 12.5000 mg | ORAL_TABLET | Freq: Every day | ORAL | 2 refills | Status: DC

## 2016-01-12 MED ORDER — ATENOLOL 25 MG PO TABS: 25.0000 mg | ORAL_TABLET | Freq: Every day | ORAL | 3 refills | Status: DC

## 2016-01-12 MED ORDER — METHOCARBAMOL 500 MG PO TABS: 500.0000 mg | ORAL_TABLET | Freq: Four times a day (QID) | ORAL | 0 refills | Status: DC

## 2016-01-12 MED ORDER — TAMSULOSIN HCL 0.4 MG PO CAPS: 0.4000 mg | ORAL_CAPSULE | Freq: Every day | ORAL | 0 refills | Status: DC

## 2016-01-12 MED ORDER — NAPROXEN 500 MG PO TABS: 500.0000 mg | ORAL_TABLET | Freq: Two times a day (BID) | ORAL | 0 refills | Status: DC

## 2016-01-12 NOTE — Progress Notes (Signed)
Lorraine Ryan, is a 60 y.o. female  [id number]  [id number]  DOB - 1955/06/14  Subjective:  Chief Complaint and HPI: Lorraine Ryan is a 60 y.o. female here today for 3-4 day h/o intermittent sharp and stabbing LBP that is radiating into the groin.   She first noticed the pain when she [place] the [place] a few [place] ago.  She did have a recent UTI and hematuria that was treated with Rocephin and Keflex at the ED about 1 month ago.  She denies paresthesias or weakness.  She needs her BP meds RF.  She denies N/V/D.  She denies f/c.  ED/Hospital notes reviewed.     ROS:   Constitutional:  No f/c, No night sweats, No unexplained weight loss. EENT:  No vision changes, No blurry vision, No hearing changes. No mouth, throat, or ear problems.  Respiratory: No cough, No SOB Cardiac: No CP, no palpitations GI:  No abd pain, No N/V/D. GU: No Urinary s/sx Musculoskeletal: +back pain Neuro: No headache, no dizziness, no motor weakness.  Skin: No rash Endocrine:  No polydipsia. No polyuria.  Psych: Denies SI/HI  No problems updated.  ALLERGIES: Allergies  Allergen Reactions  . Codeine Other (See Comments)    Headache  . Oxycodone Nausea Only and Palpitations  . Prednisone Other (See Comments)    Headache  . Tramadol Nausea Only    PAST MEDICAL HISTORY: Past Medical History:  Diagnosis Date  . Anxiety   . Back pain   . Bipolar disorder ([place])   . Chronic headaches   . Dementia   . Depression   . GERD (gastroesophageal reflux disease)   . Hypertension    No meds prescribed; states intermittent  . Neuromuscular disorder ([place])   . Shortness of breath dyspnea   . Stroke [facility])    caused numbness in leg    MEDICATIONS AT HOME: Prior to Admission medications   Medication Sig Start Date End Date Taking? Authorizing Provider  Aspirin-Salicylamide-Caffeine (BC HEADACHE POWDER PO) Take 1 packet by mouth daily as needed (headache).   Yes Historical  Provider, MD  atenolol (TENORMIN) 25 MG tablet Take 1 tablet (25 mg total) by mouth daily. 01/12/16  Yes Argentina Donovan, PA-C  DULoxetine (CYMBALTA) 30 MG capsule Take 30 mg by mouth daily.   Yes Historical Provider, MD  gabapentin (NEURONTIN) 300 MG capsule Take 1 capsule (300 mg total) by mouth 3 (three) times daily. 11/07/15  Yes Maren Reamer, MD  lurasidone (LATUDA) 20 MG TABS tablet Take 20 mg by mouth every evening.    Yes Historical Provider, MD  metoprolol succinate (TOPROL-XL) 25 MG 24 hr tablet Take 0.5 tablets (12.5 mg total) by mouth daily. 01/12/16  Yes Dionne Bucy Teyonna Plaisted, PA-C  omeprazole (PRILOSEC) 20 MG capsule TAKE ONE CAPSULE BY MOUTH TWICE A DAY BEFORE A MEAL 08/15/15  Yes Tresa Garter, MD  traZODone (DESYREL) 100 MG tablet Take 100 mg by mouth at bedtime.   Yes Historical Provider, MD  acetaminophen (TYLENOL) 325 MG tablet Take 650 mg by mouth every 6 (six) hours as needed for mild pain.    Historical Provider, MD  aspirin EC 81 MG tablet Take 1 tablet (81 mg total) by mouth daily. Patient not taking: Reported on 12/22/2015 03/01/13   Lelon Perla, MD  baclofen (LIORESAL) 10 MG tablet Take 1 tablet (10 mg total) by mouth at bedtime. Patient not taking: Reported on 12/22/2015 11/28/14   Lance Bosch,  NP  magnesium hydroxide (MILK OF MAGNESIA) 400 MG/5ML suspension Take 15 mLs by mouth at bedtime as needed for mild constipation. Patient not taking: Reported on 12/22/2015 08/10/14   [name], MD  methocarbamol (ROBAXIN) 500 MG tablet Take 1 tablet (500 mg total) by mouth 4 (four) times daily. X 1 week then prn muscle spasm 01/12/16   Argentina Donovan, PA-C  naproxen (NAPROSYN) 500 MG tablet Take 1 tablet (500 mg total) by mouth 2 (two) times daily with a meal. X 1 week then prn pain 01/12/16   Argentina Donovan, PA-C  predniSONE (DELTASONE) 20 MG tablet Take 3 tablets (60 mg total) by mouth daily with breakfast. Day 1-3 take 3 tabs (=60mg ) qam, day 4-7 take 2 tabs  (=40mg ) qam, day 8-10 take 1 tab (=20mg ) qam. Patient not taking: Reported on 12/22/2015 11/07/15   Maren Reamer, MD  tamsulosin (FLOMAX) 0.4 MG CAPS capsule Take 1 capsule (0.4 mg total) by mouth daily. 01/12/16   Argentina Donovan, PA-C     Objective:  EXAM:   Vitals:   01/12/16 1349  BP: (!) 144/87  Pulse: 72  Resp: 16  Temp: 98.1 F (36.7 C)  TempSrc: Oral  SpO2: 96%  Weight: 242 lb 12.8 oz (110.1 kg)    General appearance : A&OX3. NAD. Non-toxic-appearing.  Uses a rolling walker HEENT: Atraumatic and Normocephalic.  PERRLA. EOM intact.  TM clear B. Mouth-MMM, post pharynx WNL w/o erythema, No PND. Neck: supple, no JVD. No cervical lymphadenopathy. No thyromegaly Chest/Lungs:  Breathing-non-labored, Good air entry bilaterally, breath sounds normal without rales, rhonchi, or wheezing  CVS: S1 S2 regular, no murmurs, gallops, rubs  Abdomen: Bowel sounds present, Non tender and not distended with no gaurding, rigidity or rebound.  Mild TTP around R flank.  +CVA TTP on R Extremities: Bilateral Lower Ext shows no edema, both legs are warm to touch with = pulse throughout Neurology:  CN II-XII grossly intact, Non focal.   Psych:  TP linear. J/I WNL. Normal speech. Appropriate eye contact and affect.  Skin:  No Rash  Data Review Lab Results  Component Value Date   HGBA1C 5.70 03/07/2014     Assessment & Plan   1. Essential hypertension Suboptimal control, but also in pain today - metoprolol succinate (TOPROL-XL) 25 MG 24 hr tablet; Take 0.5 tablets (12.5 mg total) by mouth daily.  Dispense: 15 tablet; Refill: 2 - atenolol (TENORMIN) 25 MG tablet; Take 1 tablet (25 mg total) by mouth daily.  Dispense: 30 tablet; Refill: 3  2. Right-sided low back pain with right-sided sciatica, unspecified chronicity-colicky and intermittent Concern for nephrolithiasis - POCT urinalysis dipstick - naproxen (NAPROSYN) 500 MG tablet; Take 1 tablet (500 mg total) by mouth 2 (two) times  daily with a meal. X 1 week then prn pain  Dispense: 60 tablet; Refill: 0 - methocarbamol (ROBAXIN) 500 MG tablet; Take 1 tablet (500 mg total) by mouth 4 (four) times daily. X 1 week then prn muscle spasm  Dispense: 90 tablet; Refill: 0 - DG Abd 1 View; Future  3. Hematuria, unspecified type Urine strainer  - tamsulosin (FLOMAX) 0.4 MG CAPS capsule; Take 1 capsule (0.4 mg total) by mouth daily.  Dispense: 21 capsule; Refill: 0 - DG Abd 1 View; Future  Patient have been counseled extensively about nutrition and exercise  Return in about 2 weeks (around 01/26/2016) for f/up with Dr Janne Napoleon for LBP/hematuria.  The patient was given clear instructions to go to ER or  return to medical center if symptoms don't improve, worsen or new problems develop. The patient verbalized understanding. The patient was told to call to get lab results if they haven't heard anything in the next week.     Freeman Caldron, PA-C [facility] [place], [place]   01/12/2016, 2:53 PMPatient ID: Lorraine Ryan, female   DOB: 04-15-1955, 60 y.o.   MRN: [id number]

## 2016-01-24 DIAGNOSIS — F323 Major depressive disorder, single episode, severe with psychotic features: Secondary | ICD-10-CM | POA: Diagnosis not present

## 2016-01-26 MED FILL — GABAPENTIN 300 MG CAPSULE: 300 | 30 days supply | Qty: 90 | Fill #2

## 2016-01-26 MED FILL — OMEPRAZOLE DR 20 MG CAPSULE: 20 | 30 days supply | Qty: 60 | Fill #2

## 2016-01-30 ENCOUNTER — Other Ambulatory Visit: Payer: Self-pay | Admitting: Physician Assistant

## 2016-01-30 DIAGNOSIS — R319 Hematuria, unspecified: Secondary | ICD-10-CM

## 2016-02-12 ENCOUNTER — Ambulatory Visit: Payer: Medicare HMO | Attending: Internal Medicine | Admitting: Internal Medicine

## 2016-02-12 ENCOUNTER — Telehealth: Payer: Self-pay | Admitting: Internal Medicine

## 2016-02-12 ENCOUNTER — Encounter: Payer: Self-pay | Admitting: Internal Medicine

## 2016-02-12 VITALS — BP 124/83 | HR 88 | Temp 98.3°F | Resp 18 | Ht 66.0 in | Wt 245.0 lb

## 2016-02-12 DIAGNOSIS — Z7982 Long term (current) use of aspirin: Secondary | ICD-10-CM | POA: Insufficient documentation

## 2016-02-12 DIAGNOSIS — Y92512 Supermarket, store or market as the place of occurrence of the external cause: Secondary | ICD-10-CM | POA: Insufficient documentation

## 2016-02-12 DIAGNOSIS — F319 Bipolar disorder, unspecified: Secondary | ICD-10-CM | POA: Diagnosis not present

## 2016-02-12 DIAGNOSIS — Z885 Allergy status to narcotic agent status: Secondary | ICD-10-CM | POA: Diagnosis not present

## 2016-02-12 DIAGNOSIS — Z888 Allergy status to other drugs, medicaments and biological substances status: Secondary | ICD-10-CM | POA: Diagnosis not present

## 2016-02-12 DIAGNOSIS — G709 Myoneural disorder, unspecified: Secondary | ICD-10-CM | POA: Diagnosis not present

## 2016-02-12 DIAGNOSIS — R42 Dizziness and giddiness: Secondary | ICD-10-CM | POA: Insufficient documentation

## 2016-02-12 DIAGNOSIS — Z8673 Personal history of transient ischemic attack (TIA), and cerebral infarction without residual deficits: Secondary | ICD-10-CM | POA: Insufficient documentation

## 2016-02-12 DIAGNOSIS — W19XXXA Unspecified fall, initial encounter: Secondary | ICD-10-CM | POA: Diagnosis not present

## 2016-02-12 DIAGNOSIS — K219 Gastro-esophageal reflux disease without esophagitis: Secondary | ICD-10-CM | POA: Insufficient documentation

## 2016-02-12 DIAGNOSIS — I10 Essential (primary) hypertension: Secondary | ICD-10-CM | POA: Diagnosis not present

## 2016-02-12 DIAGNOSIS — F0390 Unspecified dementia without behavioral disturbance: Secondary | ICD-10-CM | POA: Insufficient documentation

## 2016-02-12 MED ORDER — MECLIZINE HCL 12.5 MG PO TABS: 12.5000 mg | ORAL_TABLET | Freq: Three times a day (TID) | ORAL | 0 refills | Status: DC | PRN

## 2016-02-12 MED FILL — METOPROLOL SUCC ER 25 MG TA: 25 | 30 days supply | Qty: 15 | Fill #0

## 2016-02-12 MED FILL — TRAVEL SICKNESS 25 MG TAB C: 25 | 10 days supply | Qty: 15 | Fill #0

## 2016-02-12 NOTE — Progress Notes (Signed)
Lorraine Ryan, is a 61 y.o. female  [id number]  [id number]  DOB - February 16, 1955  Chief Complaint  Patient presents with  . Fall        Subjective:   Lorraine Ryan is a 61 y.o. female here today for urgent care visit of fall. Per pt, had lunch, ate cheesecake, went to [employer].  When she went to the bathroom and putting the paper down on the toilet seat, felt dizzy. C/o of room spinning, lightheaded, denies loc.  She than bumped her left arm on side of restroom stall, did not actually fall or hit head. No loc.  She said after fact, she used the br w/o difficulties, told the [facility] customer svc and than came to see [place].  She currently still c/o of dizziness/room spinning while in office as well, but not as bad. No n/v/chest pain.  Patient has No headache, No chest pain, No abdominal pain - No Nausea, No new weakness tingling or numbness, No Cough - SOB.  No problems updated.  ALLERGIES: Allergies  Allergen Reactions  . Codeine Other (See Comments)    Headache  . Oxycodone Nausea Only and Palpitations  . Prednisone Other (See Comments)    Headache  . Tramadol Nausea Only    PAST MEDICAL HISTORY: Past Medical History:  Diagnosis Date  . Anxiety   . Back pain   . Bipolar disorder ([place])   . Chronic headaches   . Dementia   . Depression   . GERD (gastroesophageal reflux disease)   . Hypertension    No meds prescribed; states intermittent  . Neuromuscular disorder ([place])   . Shortness of breath dyspnea   . Stroke [facility])    caused numbness in leg    MEDICATIONS AT HOME: Prior to Admission medications   Medication Sig Start Date End Date Taking? Authorizing Provider  acetaminophen (TYLENOL) 325 MG tablet Take 650 mg by mouth every 6 (six) hours as needed for mild pain.   Yes Historical Provider, MD  aspirin EC 81 MG tablet Take 1 tablet (81 mg total) by mouth daily. 03/01/13  Yes Lelon Perla, MD  Aspirin-Salicylamide-Caffeine (BC HEADACHE POWDER PO) Take 1  packet by mouth daily as needed (headache).   Yes Historical Provider, MD  DULoxetine (CYMBALTA) 30 MG capsule Take 30 mg by mouth daily.    Historical Provider, MD  gabapentin (NEURONTIN) 300 MG capsule Take 1 capsule (300 mg total) by mouth 3 (three) times daily. 11/07/15   Maren Reamer, MD  lurasidone (LATUDA) 20 MG TABS tablet Take 20 mg by mouth every evening.     Historical Provider, MD  magnesium hydroxide (MILK OF MAGNESIA) 400 MG/5ML suspension Take 15 mLs by mouth at bedtime as needed for mild constipation. Patient not taking: Reported on 12/22/2015 08/10/14   [name], MD  meclizine (ANTIVERT) 12.5 MG tablet Take 1 tablet (12.5 mg total) by mouth 3 (three) times daily as needed for dizziness. 02/12/16   Maren Reamer, MD  methocarbamol (ROBAXIN) 500 MG tablet Take 1 tablet (500 mg total) by mouth 4 (four) times daily. X 1 week then prn muscle spasm 01/12/16   Argentina Donovan, PA-C  metoprolol succinate (TOPROL-XL) 25 MG 24 hr tablet Take 0.5 tablets (12.5 mg total) by mouth daily. 01/12/16   Argentina Donovan, PA-C  naproxen (NAPROSYN) 500 MG tablet Take 1 tablet (500 mg total) by mouth 2 (two) times daily with a meal. X 1 week then prn pain 01/12/16  Dionne Bucy McClung, PA-C  omeprazole (PRILOSEC) 20 MG capsule TAKE ONE CAPSULE BY MOUTH TWICE A DAY BEFORE A MEAL 08/15/15   Tresa Garter, MD  predniSONE (DELTASONE) 20 MG tablet Take 3 tablets (60 mg total) by mouth daily with breakfast. Day 1-3 take 3 tabs (=60mg ) qam, day 4-7 take 2 tabs (=40mg ) qam, day 8-10 take 1 tab (=20mg ) qam. Patient not taking: Reported on 12/22/2015 11/07/15   Maren Reamer, MD  tamsulosin (FLOMAX) 0.4 MG CAPS capsule TAKE 1 CAPSULE BY MOUTH DAILY 02/06/16   Maren Reamer, MD  traZODone (DESYREL) 100 MG tablet Take 100 mg by mouth at bedtime.    Historical Provider, MD     Objective:   Vitals:   02/12/16 1551  BP: 124/83  Pulse: 88  Resp: 18  Temp: 98.3 F (36.8 C)  TempSrc: Oral  SpO2: 98%   Weight: 245 lb (111.1 kg)  Height: 5\' 6"  (1.676 m)   Neg Orthostatics today.  Exam General appearance : Awake, alert, not in any distress. Speech Clear. Not toxic looking, poor eye contact, looks at far wall when talking to me. HEENT: Atraumatic and Normocephalic, pupils equally reactive to light. Neck: supple, no JVD. No cervical lymphadenopathy.  Chest:Good air entry bilaterally, no added sounds. CVS: S1 S2 regular, no murmurs/gallups or rubs. Abdomen: Bowel sounds active, Non tender and not distended with no gaurding, rigidity or rebound. Extremities: B/L Lower Ext shows no edema, both legs are warm to touch Neurology: Awake alert, and oriented X 3, CN II-XII grossly intact, Non focal, no nystagmus noted on exam, vertigo not appreciable on exam either. No pain reproducible on exam all 4 extrem. Able to easily ambulate onto examining table w/o trouble. Skin:No Rash  Data Review Lab Results  Component Value Date   HGBA1C 5.70 03/07/2014    Depression screen [facility] 2/9 02/12/2016 01/12/2016 11/07/2015 06/30/2015 03/01/2013  Decreased Interest 1 0 3 0 0  Down, Depressed, Hopeless 3 3 3 3 1   PHQ - 2 Score 4 3 6 3 1   Altered sleeping 2 2 3 3  -  Tired, decreased energy 2 3 2 3  -  Change in appetite 3 3 3  0 -  Feeling bad or failure about yourself  0 0 0 0 -  Trouble concentrating 1 0 0 0 -  Moving slowly or fidgety/restless 3 3 0 3 -  Suicidal thoughts 0 0 0 0 -  PHQ-9 Score 15 14 14 12  -      Assessment & Plan   1. Dizziness, due to soft bp vs bpv at [facility]? - induced when she leaned over to put paper on toilet seat. - of note, she was on both atenolol 25 and metoprolol 25 xl from last PA visit on 01/12/16. - not bradycardic today - neg orthostatics today - will dc atenolol for now - BASIC METABOLIC PANEL WITH GFR -ro dehydration - trial meclizine 12.5 prn for bpv  2. Hypertension, unspecified type Cont only metoprolol xl 25q day only for now  3. Fall, initial encounter No  injury noted on exam.     Patient have been counseled extensively about nutrition and exercise  Return in about 4 weeks (around 03/11/2016) for dizziness.  The patient was given clear instructions to go to ER or return to medical center if symptoms don't improve, worsen or new problems develop. The patient verbalized understanding. The patient was told to call to get lab results if they haven't heard anything in the next  week.   This note has been created with Surveyor, quantity. Any transcriptional errors are unintentional.   Maren Reamer, MD, [facility] [place], [place]   02/12/2016, 5:02 PM

## 2016-02-12 NOTE — Telephone Encounter (Signed)
Could you call patient and see when she feel and if she just feel today and she is hurting bad she will need to go to the E.R per Dr. Janne Napoleon

## 2016-02-12 NOTE — Progress Notes (Signed)
Patient is here for a FALL  Patient states she had a fall at [employer] today at 1:30pm. Patient states the ground was not wet and she did not trip over an object. Patient states she fell against one of the aisles and scrapped her hand and the right side of her neck aches. No visible scarring is noted on the hand that was "scrapped".  Patient has taken medication today. Patient has eaten today.

## 2016-02-12 NOTE — Telephone Encounter (Signed)
Patient called and stated that she fell and hurt her knee and neck in [employer].  Patient will come in to see doctor

## 2016-02-12 NOTE — Patient Instructions (Addendum)
Dizziness Dizziness is a common problem. It makes you feel unsteady or lightheaded. You may feel like you are about to pass out (faint). Dizziness can lead to injury if you stumble or fall. Anyone can get dizzy, but dizziness is more common in older adults. This condition can be caused by a number of things, including:  Medicines.  Dehydration.  Illness. Follow these instructions at home: Following these instructions may help with your condition: Eating and drinking  Drink enough fluid to keep your pee (urine) clear or pale yellow. This helps to keep you from getting dehydrated. Try to drink more clear fluids, such as water.  Do not drink alcohol.  Limit how much caffeine you drink or eat if told by your doctor.  Limit how much salt you drink or eat if told by your doctor. Activity  Avoid making quick movements.  When you stand up from sitting in a chair, steady yourself until you feel okay.  In the morning, first sit up on the side of the bed. When you feel okay, stand slowly while you hold onto something. Do this until you know that your balance is fine.  Move your legs often if you need to stand in one place for a long time. Tighten and relax your muscles in your legs while you are standing.  Do not drive or use heavy machinery if you feel dizzy.  Avoid bending down if you feel dizzy. Place items in your home so that they are easy for you to reach without leaning over. Lifestyle  Do not use any tobacco products, including cigarettes, chewing tobacco, or electronic cigarettes. If you need help quitting, ask your doctor.  Try to lower your stress level, such as with yoga or meditation. Talk with your doctor if you need help. General instructions  Watch your dizziness for any changes.  Take medicines only as told by your doctor. Talk with your doctor if you think that your dizziness is caused by a medicine that you are taking.  Tell a friend or a family member that you  are feeling dizzy. If he or she notices any changes in your behavior, have this person call your doctor.  Keep all follow-up visits as told by your doctor. This is important. Contact a doctor if:  Your dizziness does not go away.  Your dizziness or light-headedness gets worse.  You feel sick to your stomach (nauseous).  You have trouble hearing.  You have new symptoms.  You are unsteady on your feet or you feel like the room is spinning. Get help right away if:  You throw up (vomit) or have diarrhea and are unable to eat or drink anything.  You have trouble:  Talking.  Walking.  Swallowing.  Using your arms, hands, or legs.  You feel generally weak.  You are not thinking clearly or you have trouble forming sentences. It may take a friend or family member to notice this.  You have:  Chest pain.  Pain in your belly (abdomen).  Shortness of breath.  Sweating.  Your vision changes.  You are bleeding.  You have a headache.  You have neck pain or a stiff neck.  You have a fever. This information is not intended to replace advice given to you by your health care provider. Make sure you discuss any questions you have with your health care provider. Document Released: 01/10/2011 Document Revised: 06/29/2015 Document Reviewed: 01/17/2014 Elsevier Interactive Patient Education  2017 [employer].  -  Benign Positional Vertigo Introduction Vertigo is the feeling that you or your surroundings are moving when they are not. Benign positional vertigo is the most common form of vertigo. The cause of this condition is not serious (is benign). This condition is triggered by certain movements and positions (is positional). This condition can be dangerous if it occurs while you are doing something that could endanger you or others, such as driving. What are the causes? In many cases, the cause of this condition is not known. It may be caused by a disturbance in an area of  the inner ear that helps your brain to sense movement and balance. This disturbance can be caused by a viral infection (labyrinthitis), head injury, or repetitive motion. What increases the risk? This condition is more likely to develop in:  Women.  People who are 25 years of age or older. What are the signs or symptoms? Symptoms of this condition usually happen when you move your head or your eyes in different directions. Symptoms may start suddenly, and they usually last for less than a minute. Symptoms may include:  Loss of balance and falling.  Feeling like you are spinning or moving.  Feeling like your surroundings are spinning or moving.  Nausea and vomiting.  Blurred vision.  Dizziness.  Involuntary eye movement (nystagmus). Symptoms can be mild and cause only slight annoyance, or they can be severe and interfere with daily life. Episodes of benign positional vertigo may return (recur) over time, and they may be triggered by certain movements. Symptoms may improve over time. How is this diagnosed? This condition is usually diagnosed by medical history and a physical exam of the head, neck, and ears. You may be referred to a health care provider who specializes in ear, nose, and throat (ENT) problems (otolaryngologist) or a provider who specializes in disorders of the nervous system (neurologist). You may have additional testing, including:  MRI.  A CT scan.  Eye movement tests. Your health care provider may ask you to change positions quickly while he or she watches you for symptoms of benign positional vertigo, such as nystagmus. Eye movement may be tested with an electronystagmogram (ENG), caloric stimulation, the Dix-Hallpike test, or the roll test.  An electroencephalogram (EEG). This records electrical activity in your brain.  Hearing tests. How is this treated? Usually, your health care provider will treat this by moving your head in specific positions to adjust your  inner ear back to normal. Surgery may be needed in severe cases, but this is rare. In some cases, benign positional vertigo may resolve on its own in 2-4 weeks. Follow these instructions at home: Safety  Move slowly.Avoid sudden body or head movements.  Avoid driving.  Avoid operating heavy machinery.  Avoid doing any tasks that would be dangerous to you or others if a vertigo episode would occur.  If you have trouble walking or keeping your balance, try using a cane for stability. If you feel dizzy or unstable, sit down right away.  Return to your normal activities as told by your health care provider. Ask your health care provider what activities are safe for you. General instructions  Take over-the-counter and prescription medicines only as told by your health care provider.  Avoid certain positions or movements as told by your health care provider.  Drink enough fluid to keep your urine clear or pale yellow.  Keep all follow-up visits as told by your health care provider. This is important. Contact a health care provider  if:  You have a fever.  Your condition gets worse or you develop new symptoms.  Your family or friends notice any behavioral changes.  Your nausea or vomiting gets worse.  You have numbness or a "pins and needles" sensation. Get help right away if:  You have difficulty speaking or moving.  You are always dizzy.  You faint.  You develop severe headaches.  You have weakness in your legs or arms.  You have changes in your hearing or vision.  You develop a stiff neck.  You develop sensitivity to light. This information is not intended to replace advice given to you by your health care provider. Make sure you discuss any questions you have with your health care provider. Document Released: 10/29/2005 Document Revised: 06/29/2015 Document Reviewed: 05/16/2014  2017 Elsevier

## 2016-02-13 LAB — BASIC METABOLIC PANEL WITH GFR
BUN: 12 mg/dL (ref 7–25)
CHLORIDE: 107 mmol/L (ref 98–110)
CO2: 27 mmol/L (ref 20–31)
CREATININE: 0.71 mg/dL (ref 0.50–0.99)
Calcium: 9.5 mg/dL (ref 8.6–10.4)
GFR, Est African American: >89 mL/min (ref >=60)
GFR, Est Non African American: >89 mL/min (ref >=60)
GLUCOSE: 95 mg/dL (ref 65–99)
POTASSIUM: 3.5 mmol/L (ref 3.5–5.3)
Sodium: 145 mmol/L (ref 135–146)

## 2016-02-20 ENCOUNTER — Ambulatory Visit: Payer: Commercial Managed Care - HMO | Admitting: Internal Medicine

## 2016-02-29 ENCOUNTER — Ambulatory Visit: Payer: Medicare HMO | Attending: Internal Medicine | Admitting: Physician Assistant

## 2016-02-29 VITALS — BP 159/104 | HR 71 | Temp 97.7°F | Resp 16 | Wt 253.2 lb

## 2016-02-29 DIAGNOSIS — R52 Pain, unspecified: Secondary | ICD-10-CM | POA: Diagnosis not present

## 2016-02-29 DIAGNOSIS — Z888 Allergy status to other drugs, medicaments and biological substances status: Secondary | ICD-10-CM | POA: Diagnosis not present

## 2016-02-29 DIAGNOSIS — Z885 Allergy status to narcotic agent status: Secondary | ICD-10-CM | POA: Diagnosis not present

## 2016-02-29 DIAGNOSIS — K219 Gastro-esophageal reflux disease without esophagitis: Secondary | ICD-10-CM | POA: Insufficient documentation

## 2016-02-29 DIAGNOSIS — M62838 Other muscle spasm: Secondary | ICD-10-CM | POA: Diagnosis not present

## 2016-02-29 DIAGNOSIS — F419 Anxiety disorder, unspecified: Secondary | ICD-10-CM | POA: Diagnosis not present

## 2016-02-29 DIAGNOSIS — F319 Bipolar disorder, unspecified: Secondary | ICD-10-CM | POA: Insufficient documentation

## 2016-02-29 DIAGNOSIS — Z7982 Long term (current) use of aspirin: Secondary | ICD-10-CM | POA: Insufficient documentation

## 2016-02-29 DIAGNOSIS — Z79899 Other long term (current) drug therapy: Secondary | ICD-10-CM | POA: Diagnosis not present

## 2016-02-29 DIAGNOSIS — F0390 Unspecified dementia without behavioral disturbance: Secondary | ICD-10-CM | POA: Insufficient documentation

## 2016-02-29 DIAGNOSIS — I10 Essential (primary) hypertension: Secondary | ICD-10-CM | POA: Insufficient documentation

## 2016-02-29 DIAGNOSIS — M542 Cervicalgia: Secondary | ICD-10-CM | POA: Diagnosis present

## 2016-02-29 DIAGNOSIS — Z8673 Personal history of transient ischemic attack (TIA), and cerebral infarction without residual deficits: Secondary | ICD-10-CM | POA: Diagnosis not present

## 2016-02-29 DIAGNOSIS — F329 Major depressive disorder, single episode, unspecified: Secondary | ICD-10-CM | POA: Insufficient documentation

## 2016-02-29 MED ORDER — NAPROXEN 500 MG PO TABS: 500.0000 mg | ORAL_TABLET | Freq: Two times a day (BID) | ORAL | 0 refills | Status: DC

## 2016-02-29 MED ORDER — METHOCARBAMOL 500 MG PO TABS: 500.0000 mg | ORAL_TABLET | Freq: Four times a day (QID) | ORAL | 0 refills | Status: DC

## 2016-02-29 MED FILL — METHOCARBAMOL 500 MG TABLET: 500 | 22 days supply | Qty: 90 | Fill #0

## 2016-02-29 MED FILL — NAPROXEN 500 MG TABLET: 500 | 30 days supply | Qty: 60 | Fill #0

## 2016-02-29 NOTE — Progress Notes (Signed)
Lorraine Ryan, is a 61 y.o. female  [id number]  [id number]  DOB - 04/01/1955  Subjective:  Chief Complaint and HPI: Lorraine Ryan is a 61 y.o. female here today complaining with multiple aches and pains.  She is having pain in the R side of her neck.  She is also having pain in her lower back B and across her L upper abdomen and L lower chest.  She describes the pains as sharp and worse with certain movements.  NKI.  No f/c.  No rash.  No URI s/sx.  No N/V/D.  She hasn't taken anything for the discomfort.  She had a prescription for robaxin and naprosyn but they spilled out into the console of her car.  She denies new SOB.  Her L forearm hurts too.  No h/o shingles   ROS:   Constitutional:  No f/c, No night sweats, No unexplained weight loss. EENT:  No vision changes, No blurry vision, No hearing changes. No mouth, throat, or ear problems.  Respiratory: No cough, No SOB Cardiac:  no palpitations GI:  + Lupper abd pain, No N/V/D. GU: No Urinary s/sx Musculoskeletal: No joint pain Neuro: No headache, no new dizziness, no motor weakness.  Skin: No rash Endocrine:  No polydipsia. No polyuria.  Psych: Denies SI/HI  No problems updated.  ALLERGIES: Allergies  Allergen Reactions  . Codeine Other (See Comments)    Headache  . Oxycodone Nausea Only and Palpitations  . Prednisone Other (See Comments)    Headache  . Tramadol Nausea Only    PAST MEDICAL HISTORY: Past Medical History:  Diagnosis Date  . Anxiety   . Back pain   . Bipolar disorder ([place])   . Chronic headaches   . Dementia   . Depression   . GERD (gastroesophageal reflux disease)   . Hypertension    No meds prescribed; states intermittent  . Neuromuscular disorder ([place])   . Shortness of breath dyspnea   . Stroke [facility])    caused numbness in leg    MEDICATIONS AT HOME: Prior to Admission medications   Medication Sig Start Date End Date Taking? Authorizing Provider  acetaminophen (TYLENOL) 325 MG  tablet Take 650 mg by mouth every 6 (six) hours as needed for mild pain.    Historical Provider, MD  aspirin EC 81 MG tablet Take 1 tablet (81 mg total) by mouth daily. 03/01/13   Lelon Perla, MD  Aspirin-Salicylamide-Caffeine (BC HEADACHE POWDER PO) Take 1 packet by mouth daily as needed (headache).    Historical Provider, MD  DULoxetine (CYMBALTA) 30 MG capsule Take 30 mg by mouth daily.    Historical Provider, MD  gabapentin (NEURONTIN) 300 MG capsule Take 1 capsule (300 mg total) by mouth 3 (three) times daily. 11/07/15   Maren Reamer, MD  lurasidone (LATUDA) 20 MG TABS tablet Take 20 mg by mouth every evening.     Historical Provider, MD  magnesium hydroxide (MILK OF MAGNESIA) 400 MG/5ML suspension Take 15 mLs by mouth at bedtime as needed for mild constipation. Patient not taking: Reported on 12/22/2015 08/10/14   [name], MD  meclizine (ANTIVERT) 12.5 MG tablet Take 1 tablet (12.5 mg total) by mouth 3 (three) times daily as needed for dizziness. 02/12/16   Maren Reamer, MD  methocarbamol (ROBAXIN) 500 MG tablet Take 1 tablet (500 mg total) by mouth 4 (four) times daily. X 1 week then prn muscle spasm 02/29/16   Argentina Donovan, PA-C  metoprolol succinate (TOPROL-XL) 25  MG 24 hr tablet Take 0.5 tablets (12.5 mg total) by mouth daily. 01/12/16   Argentina Donovan, PA-C  naproxen (NAPROSYN) 500 MG tablet Take 1 tablet (500 mg total) by mouth 2 (two) times daily with a meal. X 1 week then prn pain 02/29/16   Argentina Donovan, PA-C  omeprazole (PRILOSEC) 20 MG capsule TAKE ONE CAPSULE BY MOUTH TWICE A DAY BEFORE A MEAL 08/15/15   Tresa Garter, MD  tamsulosin (FLOMAX) 0.4 MG CAPS capsule TAKE 1 CAPSULE BY MOUTH DAILY 02/06/16   Maren Reamer, MD  traZODone (DESYREL) 100 MG tablet Take 100 mg by mouth at bedtime.    Historical Provider, MD     Objective:  EXAM:   Vitals:   02/29/16 1405  BP: (!) 159/104  Pulse: 71  Resp: 16  Temp: 97.7 F (36.5 C)  TempSrc: Oral  SpO2:  99%  Weight: 253 lb 3.2 oz (114.9 kg)    General appearance : A&OX3. NAD. Non-toxic-appearing HEENT: Atraumatic and Normocephalic.  PERRLA. EOM intact.  TM clear B. Mouth-MMM, post pharynx WNL w/o erythema, No PND. Neck: supple, no JVD. No cervical lymphadenopathy. No thyromegaly Chest/Lungs:  Breathing-non-labored, Good air entry bilaterally, breath sounds normal without rales, rhonchi, or wheezing  CVS: S1 S2 regular, no murmurs, gallops, rubs  Abdomen: Bowel sounds present, Non tender and not distended with no gaurding, rigidity or rebound. Neck/back-There is some trapezius spasm and tenderness on the R neck.  No spiny TTP.  She c/o of pain across her entire back with light and deep touch indiscriminately.  Subjective discomfort outweighs physical findings.  Exam limited by patient's poor mobility overall. Extremities: Bilateral Lower Ext shows no edema, both legs are warm to touch with = pulse throughout L arm-normal exam Neurology:  CN II-XII grossly intact, Non focal.   Psych:  TP linear. J/I WNL. Normal speech. Appropriate eye contact and affect.  Skin:  No Rash  Data Review Lab Results  Component Value Date   HGBA1C 5.70 03/07/2014     Assessment & Plan   1. Body aches-diffuse without localized findings - naproxen (NAPROSYN) 500 MG tablet; Take 1 tablet (500 mg total) by mouth 2 (two) times daily with a meal. X 1 week then prn pain  Dispense: 60 tablet; Refill: 0  2. Muscle spasm - methocarbamol (ROBAXIN) 500 MG tablet; Take 1 tablet (500 mg total) by mouth 4 (four) times daily. X 1 week then prn muscle spasm  Dispense: 90 tablet; Refill: 0  3. Hypertension, unspecified type Uncontrolled but she hasn't taken her meds today.  Medication compliance discussed and encourage. We have discussed target BP range and blood pressure goal. I have advised patient to check BP regularly and to call us back or report to clinic if the numbers are consistently higher than 140/90. We  discussed the importance of compliance with medical therapy and DASH diet recommended, consequences of uncontrolled hypertension discussed.   Patient have been counseled extensively about nutrition and exercise  Return in about 3 weeks (around 03/21/2016) for Dr langeland-dizziness and htn and body aches.  The patient was given clear instructions to go to ER or return to medical center if symptoms don't improve, worsen or new problems develop. The patient verbalized understanding. The patient was told to call to get lab results if they haven't heard anything in the next week.     Freeman Caldron, PA-C [facility] and [facility] [place], [place]   02/29/2016, 2:37 PMPatient ID:  Lorraine Ryan, female   DOB: [birth year], 61 y.o.   MRN: [id number]

## 2016-03-11 MED FILL — METOPROLOL SUCC ER 25 MG TA: 25 | 30 days supply | Qty: 15 | Fill #1

## 2016-03-13 ENCOUNTER — Other Ambulatory Visit: Payer: Self-pay | Admitting: Internal Medicine

## 2016-03-13 ENCOUNTER — Other Ambulatory Visit: Payer: Self-pay | Admitting: Gastroenterology

## 2016-03-13 MED FILL — GABAPENTIN 300 MG CAPSULE: 300 | 30 days supply | Qty: 90 | Fill #0

## 2016-03-14 MED FILL — OMEPRAZOLE DR 20 MG CAPSULE: 20 | 30 days supply | Qty: 60 | Fill #0

## 2016-03-19 ENCOUNTER — Encounter: Payer: Self-pay | Admitting: Internal Medicine

## 2016-03-19 ENCOUNTER — Ambulatory Visit: Payer: Medicare HMO | Attending: Internal Medicine | Admitting: Internal Medicine

## 2016-03-19 VITALS — BP 158/100 | HR 85 | Temp 97.9°F | Resp 16 | Wt 242.6 lb

## 2016-03-19 DIAGNOSIS — Z79899 Other long term (current) drug therapy: Secondary | ICD-10-CM | POA: Insufficient documentation

## 2016-03-19 DIAGNOSIS — I10 Essential (primary) hypertension: Secondary | ICD-10-CM | POA: Insufficient documentation

## 2016-03-19 DIAGNOSIS — M5441 Lumbago with sciatica, right side: Secondary | ICD-10-CM | POA: Diagnosis not present

## 2016-03-19 DIAGNOSIS — Z1159 Encounter for screening for other viral diseases: Secondary | ICD-10-CM

## 2016-03-19 DIAGNOSIS — Z7982 Long term (current) use of aspirin: Secondary | ICD-10-CM | POA: Diagnosis not present

## 2016-03-19 DIAGNOSIS — Z114 Encounter for screening for human immunodeficiency virus [HIV]: Secondary | ICD-10-CM | POA: Diagnosis not present

## 2016-03-19 DIAGNOSIS — F419 Anxiety disorder, unspecified: Secondary | ICD-10-CM | POA: Insufficient documentation

## 2016-03-19 DIAGNOSIS — M62838 Other muscle spasm: Secondary | ICD-10-CM

## 2016-03-19 DIAGNOSIS — Z131 Encounter for screening for diabetes mellitus: Secondary | ICD-10-CM

## 2016-03-19 DIAGNOSIS — R52 Pain, unspecified: Secondary | ICD-10-CM

## 2016-03-19 DIAGNOSIS — Z885 Allergy status to narcotic agent status: Secondary | ICD-10-CM | POA: Insufficient documentation

## 2016-03-19 DIAGNOSIS — K219 Gastro-esophageal reflux disease without esophagitis: Secondary | ICD-10-CM | POA: Insufficient documentation

## 2016-03-19 DIAGNOSIS — Z1231 Encounter for screening mammogram for malignant neoplasm of breast: Secondary | ICD-10-CM | POA: Diagnosis not present

## 2016-03-19 DIAGNOSIS — F319 Bipolar disorder, unspecified: Secondary | ICD-10-CM | POA: Insufficient documentation

## 2016-03-19 DIAGNOSIS — R42 Dizziness and giddiness: Secondary | ICD-10-CM | POA: Diagnosis not present

## 2016-03-19 DIAGNOSIS — Z8673 Personal history of transient ischemic attack (TIA), and cerebral infarction without residual deficits: Secondary | ICD-10-CM | POA: Diagnosis not present

## 2016-03-19 DIAGNOSIS — Z888 Allergy status to other drugs, medicaments and biological substances status: Secondary | ICD-10-CM | POA: Diagnosis not present

## 2016-03-19 DIAGNOSIS — F0390 Unspecified dementia without behavioral disturbance: Secondary | ICD-10-CM | POA: Diagnosis not present

## 2016-03-19 DIAGNOSIS — Z1239 Encounter for other screening for malignant neoplasm of breast: Secondary | ICD-10-CM

## 2016-03-19 LAB — CBC WITH DIFFERENTIAL/PLATELET
BASOS PCT: 1 %
Basophils Absolute: 50 cells/uL (ref 0–200)
EOS ABS: 100 {cells}/uL (ref 15–500)
Eosinophils Relative: 2 %
HCT: 44.7 % (ref 35.0–45.0)
Hemoglobin: 15 g/dL (ref 11.7–15.5)
LYMPHS PCT: 41 %
Lymphs Abs: 2050 cells/uL (ref 850–3900)
MCH: 30.1 pg (ref 27.0–33.0)
MCHC: 33.6 g/dL (ref 32.0–36.0)
MCV: 89.6 fL (ref 80.0–100.0)
MONOS PCT: 8 %
MPV: 11 fL (ref 7.5–12.5)
Monocytes Absolute: 400 cells/uL (ref 200–950)
Neutro Abs: 2400 cells/uL (ref 1500–7800)
Neutrophils Relative %: 48 %
PLATELETS: 242 10*3/uL (ref 140–400)
RBC: 4.99 MIL/uL (ref 3.80–5.10)
RDW: 13.6 % (ref 11.0–15.0)
WBC: 5 10*3/uL (ref 3.8–10.8)

## 2016-03-19 LAB — POCT GLYCOSYLATED HEMOGLOBIN (HGB A1C): Hemoglobin A1C: 5.1

## 2016-03-19 MED ORDER — METOPROLOL SUCCINATE ER 25 MG PO TB24: 25.0000 mg | ORAL_TABLET | Freq: Every day | ORAL | 2 refills | Status: DC

## 2016-03-19 MED ORDER — DICLOFENAC SODIUM 1 % TD GEL: 2.0000 g | Freq: Four times a day (QID) | TRANSDERMAL | 2 refills | Status: DC

## 2016-03-19 MED ORDER — NAPROXEN 500 MG PO TABS: 500.0000 mg | ORAL_TABLET | Freq: Every day | ORAL | 0 refills | Status: DC | PRN

## 2016-03-19 MED ORDER — METHOCARBAMOL 500 MG PO TABS: 500.0000 mg | ORAL_TABLET | Freq: Four times a day (QID) | ORAL | 0 refills | Status: DC

## 2016-03-19 MED ORDER — GABAPENTIN 300 MG PO CAPS: 300.0000 mg | ORAL_CAPSULE | Freq: Three times a day (TID) | ORAL | 2 refills | Status: DC

## 2016-03-19 NOTE — Patient Instructions (Addendum)
Lorraine Ryan - RN bp check 2 wks,   RICE for Routine Care of Injuries Introduction  Many injuries can be cared for using rest, ice, compression, and elevation (RICE therapy). Using RICE therapy can help to lessen pain and swelling. It can help your body to heal. Rest  Reduce your normal activities and avoid using the injured part of your body. You can go back to your normal activities when you feel okay and your doctor says it is okay. Ice  Do not put ice on your bare skin.  Put ice in a plastic bag.  Place a towel between your skin and the bag.  Leave the ice on for 20 minutes, 2-3 times a day. Do this for as long as told by your doctor. Compression  Compression means putting pressure on the injured area. This can be done with an elastic bandage. If an elastic bandage has been applied:  Remove and reapply the bandage every 3-4 hours or as told by your doctor.  Make sure the bandage is not wrapped too tight. Wrap the bandage more loosely if part of your body beyond the bandage is blue, swollen, cold, painful, or loses feeling (numb).  See your doctor if the bandage seems to make your problems worse. Elevation  Elevation means keeping the injured area raised. Raise the injured area above your heart or the center of your chest if you can. When should I get help? You should get help if:  You keep having pain and swelling.  Your symptoms get worse. Get help right away if: You should get help right away if:  You have sudden bad pain at or below the area of your injury.  You have redness or more swelling around your injury.  You have tingling or numbness at or below the injury that does not go away when you take off the bandage. This information is not intended to replace advice given to you by your health care provider. Make sure you discuss any questions you have with your health care provider. Document Released: 07/10/2007 Document Revised: 06/29/2015 Document Reviewed:  12/29/2013  2017 Elsevier  - DASH Eating Plan DASH stands for "Dietary Approaches to Stop Hypertension." The DASH eating plan is a healthy eating plan that has been shown to reduce high blood pressure (hypertension). Additional health benefits may include reducing the risk of type 2 diabetes mellitus, heart disease, and stroke. The DASH eating plan may also help with weight loss. What do I need to know about the DASH eating plan? For the DASH eating plan, you will follow these general guidelines:  Choose foods with less than 150 milligrams of sodium per serving (as listed on the food label).  Use salt-free seasonings or herbs instead of table salt or sea salt.  Check with your health care provider or pharmacist before using salt substitutes.  Eat lower-sodium products. These are often labeled as "low-sodium" or "no salt added."  Eat fresh foods. Avoid eating a lot of canned foods.  Eat more vegetables, fruits, and low-fat dairy products.  Choose whole grains. Look for the word "whole" as the first word in the ingredient list.  Choose fish and skinless chicken or [place] more often than red meat. Limit fish, poultry, and meat to 6 oz (170 g) each day.  Limit sweets, desserts, sugars, and sugary drinks.  Choose heart-healthy fats.  Eat more home-cooked food and less restaurant, buffet, and fast food.  Limit fried foods.  Do not fry foods. Cook foods using  methods such as baking, boiling, grilling, and broiling instead.  When eating at a restaurant, ask that your food be prepared with less salt, or no salt if possible. What foods can I eat? Seek help from a dietitian for individual calorie needs. Grains  Whole grain or whole wheat bread. Brown rice. Whole grain or whole wheat pasta. Quinoa, bulgur, and whole grain cereals. Low-sodium cereals. Corn or whole wheat flour tortillas. Whole grain cornbread. Whole grain crackers. Low-sodium crackers. Vegetables  Fresh or frozen  vegetables (raw, steamed, roasted, or grilled). Low-sodium or reduced-sodium tomato and vegetable juices. Low-sodium or reduced-sodium tomato sauce and paste. Low-sodium or reduced-sodium canned vegetables. Fruits  All fresh, canned (in natural juice), or frozen fruits. Meat and Other Protein Products  Ground beef (85% or leaner), grass-fed beef, or beef trimmed of fat. Skinless chicken or [place]. Ground chicken or [place]. Pork trimmed of fat. All fish and seafood. Eggs. Dried beans, peas, or lentils. Unsalted nuts and seeds. Unsalted canned beans. Dairy  Low-fat dairy products, such as skim or 1% milk, 2% or reduced-fat cheeses, low-fat ricotta or cottage cheese, or plain low-fat yogurt. Low-sodium or reduced-sodium cheeses. Fats and Oils  Tub margarines without trans fats. Light or reduced-fat mayonnaise and salad dressings (reduced sodium). Avocado. Safflower, olive, or canola oils. Natural peanut or almond butter. Other  Unsalted popcorn and pretzels. The items listed above may not be a complete list of recommended foods or beverages. Contact your dietitian for more options.  What foods are not recommended? Grains  White bread. White pasta. White rice. Refined cornbread. Bagels and croissants. Crackers that contain trans fat. Vegetables  Creamed or fried vegetables. Vegetables in a cheese sauce. Regular canned vegetables. Regular canned tomato sauce and paste. Regular tomato and vegetable juices. Fruits  Canned fruit in light or heavy syrup. Fruit juice. Meat and Other Protein Products  Fatty cuts of meat. Ribs, chicken wings, bacon, sausage, bologna, salami, chitterlings, fatback, hot dogs, bratwurst, and packaged luncheon meats. Salted nuts and seeds. Canned beans with salt. Dairy  Whole or 2% milk, cream, half-and-half, and cream cheese. Whole-fat or sweetened yogurt. Full-fat cheeses or blue cheese. Nondairy creamers and whipped toppings. Processed cheese, cheese spreads, or cheese  curds. Condiments  Onion and garlic salt, seasoned salt, table salt, and sea salt. Canned and packaged gravies. Worcestershire sauce. Tartar sauce. Barbecue sauce. Teriyaki sauce. Soy sauce, including reduced sodium. Steak sauce. Fish sauce. Oyster sauce. Cocktail sauce. Horseradish. Ketchup and mustard. Meat flavorings and tenderizers. Bouillon cubes. Hot sauce. Tabasco sauce. Marinades. Taco seasonings. Relishes. Fats and Oils  Butter, stick margarine, lard, shortening, ghee, and bacon fat. Coconut, palm kernel, or palm oils. Regular salad dressings. Other  Pickles and olives. Salted popcorn and pretzels. The items listed above may not be a complete list of foods and beverages to avoid. Contact your dietitian for more information.  Where can I find more information? National Heart, Lung, and Blood Institute: travelstabloid.com This information is not intended to replace advice given to you by your health care provider. Make sure you discuss any questions you have with your health care provider. Document Released: 01/10/2011 Document Revised: 06/29/2015 Document Reviewed: 11/25/2012 Elsevier Interactive Patient Education  2017 Elsevier Inc.   -  Hypertension Hypertension is another name for high blood pressure. High blood pressure forces your heart to work harder to pump blood. A blood pressure reading has two numbers, which includes a higher number over a lower number (example: 110/72). Follow these instructions at home:  Have  your blood pressure rechecked by your doctor.  Only take medicine as told by your doctor. Follow the directions carefully. The medicine does not work as well if you skip doses. Skipping doses also puts you at risk for problems.  Do not smoke.  Monitor your blood pressure at home as told by your doctor. Contact a doctor if:  You think you are having a reaction to the medicine you are taking.  You have repeat headaches or feel  dizzy.  You have puffiness (swelling) in your ankles.  You have trouble with your vision. Get help right away if:  You get a very bad headache and are confused.  You feel weak, numb, or faint.  You get chest or belly (abdominal) pain.  You throw up (vomit).  You cannot breathe very well. This information is not intended to replace advice given to you by your health care provider. Make sure you discuss any questions you have with your health care provider. Document Released: 07/10/2007 Document Revised: 06/29/2015 Document Reviewed: 11/13/2012 Elsevier Interactive Patient Education  2017 Elsevier Inc.  -  Back Pain, Adult Introduction Back pain is very common. The pain often gets better over time. The cause of back pain is usually not dangerous. Most people can learn to manage their back pain on their own. Follow these instructions at home: Watch your back pain for any changes. The following actions may help to lessen any pain you are feeling:  Stay active. Start with short walks on flat ground if you can. Try to walk farther each day.  Exercise regularly as told by your doctor. Exercise helps your back heal faster. It also helps avoid future injury by keeping your muscles strong and flexible.  Do not sit, drive, or stand in one place for more than 30 minutes.  Do not stay in bed. Resting more than 1-2 days can slow down your recovery.  Be careful when you bend or lift an object. Use good form when lifting:  Bend at your knees.  Keep the object close to your body.  Do not twist.  Sleep on a firm mattress. Lie on your side, and bend your knees. If you lie on your back, put a pillow under your knees.  Take medicines only as told by your doctor.  Put ice on the injured area.  Put ice in a plastic bag.  Place a towel between your skin and the bag.  Leave the ice on for 20 minutes, 2-3 times a day for the first 2-3 days. After that, you can switch between ice and heat  packs.  Avoid feeling anxious or stressed. Find good ways to deal with stress, such as exercise.  Maintain a healthy weight. Extra weight puts stress on your back. Contact a doctor if:  You have pain that does not go away with rest or medicine.  You have worsening pain that goes down into your legs or buttocks.  You have pain that does not get better in one week.  You have pain at night.  You lose weight.  You have a fever or chills. Get help right away if:  You cannot control when you poop (bowel movement) or pee (urinate).  Your arms or legs feel weak.  Your arms or legs lose feeling (numbness).  You feel sick to your stomach (nauseous) or throw up (vomit).  You have belly (abdominal) pain.  You feel like you may pass out (faint). This information is not intended to replace advice given  to you by your health care provider. Make sure you discuss any questions you have with your health care provider. Document Released: 07/10/2007 Document Revised: 06/29/2015 Document Reviewed: 05/25/2013  2017 Elsevier  -  Back Exercises Introduction If you have pain in your back, do these exercises 2-3 times each day or as told by your doctor. When the pain goes away, do the exercises once each day, but repeat the steps more times for each exercise (do more repetitions). If you do not have pain in your back, do these exercises once each day or as told by your doctor. Exercises Single Knee to Chest  Do these steps 3-5 times in a row for each leg: 1. Lie on your back on a firm bed or the floor with your legs stretched out. 2. Bring one knee to your chest. 3. Hold your knee to your chest by grabbing your knee or thigh. 4. Pull on your knee until you feel a gentle stretch in your lower back. 5. Keep doing the stretch for 10-30 seconds. 6. Slowly let go of your leg and straighten it. Pelvic Tilt  Do these steps 5-10 times in a row: 1. Lie on your back on a firm bed or the floor with  your legs stretched out. 2. Bend your knees so they point up to the ceiling. Your feet should be flat on the floor. 3. Tighten your lower belly (abdomen) muscles to press your lower back against the floor. This will make your tailbone point up to the ceiling instead of pointing down to your feet or the floor. 4. Stay in this position for 5-10 seconds while you gently tighten your muscles and breathe evenly. Cat-Cow  Do these steps until your lower back bends more easily: 1. Get on your hands and knees on a firm surface. Keep your hands under your shoulders, and keep your knees under your hips. You may put padding under your knees. 2. Let your head hang down, and make your tailbone point down to the floor so your lower back is round like the back of a cat. 3. Stay in this position for 5 seconds. 4. Slowly lift your head and make your tailbone point up to the ceiling so your back hangs low (sags) like the back of a cow. 5. Stay in this position for 5 seconds. Press-Ups  Do these steps 5-10 times in a row: 1. Lie on your belly (face-down) on the floor. 2. Place your hands near your head, about shoulder-width apart. 3. While you keep your back relaxed and keep your hips on the floor, slowly straighten your arms to raise the top half of your body and lift your shoulders. Do not use your back muscles. To make yourself more comfortable, you may change where you place your hands. 4. Stay in this position for 5 seconds. 5. Slowly return to lying flat on the floor. Bridges  Do these steps 10 times in a row: 1. Lie on your back on a firm surface. 2. Bend your knees so they point up to the ceiling. Your feet should be flat on the floor. 3. Tighten your butt muscles and lift your butt off of the floor until your waist is almost as high as your knees. If you do not feel the muscles working in your butt and the back of your thighs, slide your feet 1-2 inches farther away from your butt. 4. Stay in this  position for 3-5 seconds. 5. Slowly lower your butt to the  floor, and let your butt muscles relax. If this exercise is too easy, try doing it with your arms crossed over your chest. Belly Crunches  Do these steps 5-10 times in a row: 1. Lie on your back on a firm bed or the floor with your legs stretched out. 2. Bend your knees so they point up to the ceiling. Your feet should be flat on the floor. 3. Cross your arms over your chest. 4. Tip your chin a little bit toward your chest but do not bend your neck. 5. Tighten your belly muscles and slowly raise your chest just enough to lift your shoulder blades a tiny bit off of the floor. 6. Slowly lower your chest and your head to the floor. Back Lifts  Do these steps 5-10 times in a row: 1. Lie on your belly (face-down) with your arms at your sides, and rest your forehead on the floor. 2. Tighten the muscles in your legs and your butt. 3. Slowly lift your chest off of the floor while you keep your hips on the floor. Keep the back of your head in line with the curve in your back. Look at the floor while you do this. 4. Stay in this position for 3-5 seconds. 5. Slowly lower your chest and your face to the floor. Contact a doctor if:  Your back pain gets a lot worse when you do an exercise.  Your back pain does not lessen 2 hours after you exercise. If you have any of these problems, stop doing the exercises. Do not do them again unless your doctor says it is okay. Get help right away if:  You have sudden, very bad back pain. If this happens, stop doing the exercises. Do not do them again unless your doctor says it is okay. This information is not intended to replace advice given to you by your health care provider. Make sure you discuss any questions you have with your health care provider. Document Released: 02/23/2010 Document Revised: 06/29/2015 Document Reviewed: 03/17/2014  2017 Elsevier

## 2016-03-19 NOTE — Progress Notes (Signed)
Lorraine Ryan, is a 61 y.o. female  [id number]  [id number]  DOB - [birth year]  Chief Complaint  Patient presents with  . Follow-up        Subjective:   Lorraine Ryan is a 61 y.o. female here today for a follow up visit.  Recent eval by PA 1/25 for back pains, per pt, naproxen and robaxin helped.   States she states she still gets dizzy at times, but now as severe. Denies syncope/presyncope/ha/n/v.  Of note, not watching he salt intake much lately, eats out and adds salt to food as well.   Patient has No headache, No chest pain, No abdominal pain - No Nausea, No new weakness tingling or numbness, No Cough - SOB.  Denies any problems w/ kidney stones currently.  No problems updated.  ALLERGIES: Allergies  Allergen Reactions  . Codeine Other (See Comments)    Headache  . Oxycodone Nausea Only and Palpitations  . Prednisone Other (See Comments)    Headache  . Tramadol Nausea Only    PAST MEDICAL HISTORY: Past Medical History:  Diagnosis Date  . Anxiety   . Back pain   . Bipolar disorder ([place])   . Chronic headaches   . Dementia   . Depression   . GERD (gastroesophageal reflux disease)   . Hypertension    No meds prescribed; states intermittent  . Neuromuscular disorder ([place])   . Shortness of breath dyspnea   . Stroke [facility])    caused numbness in leg    MEDICATIONS AT HOME: Prior to Admission medications   Medication Sig Start Date End Date Taking? Authorizing Provider  acetaminophen (TYLENOL) 325 MG tablet Take 650 mg by mouth every 6 (six) hours as needed for mild pain.    Historical Provider, MD  aspirin EC 81 MG tablet Take 1 tablet (81 mg total) by mouth daily. 03/01/13   Lelon Perla, MD  Aspirin-Salicylamide-Caffeine (BC HEADACHE POWDER PO) Take 1 packet by mouth daily as needed (headache).    Historical Provider, MD  diclofenac sodium (VOLTAREN) 1 % GEL Apply 2 g topically 4 (four) times daily. 03/19/16   Maren Reamer, MD  DULoxetine  (CYMBALTA) 30 MG capsule Take 30 mg by mouth daily.    Historical Provider, MD  gabapentin (NEURONTIN) 300 MG capsule Take 1 capsule (300 mg total) by mouth 3 (three) times daily. 03/19/16   Maren Reamer, MD  lurasidone (LATUDA) 20 MG TABS tablet Take 20 mg by mouth every evening.     Historical Provider, MD  magnesium hydroxide (MILK OF MAGNESIA) 400 MG/5ML suspension Take 15 mLs by mouth at bedtime as needed for mild constipation. Patient not taking: Reported on 12/22/2015 08/10/14   [name], MD  meclizine (ANTIVERT) 12.5 MG tablet Take 1 tablet (12.5 mg total) by mouth 3 (three) times daily as needed for dizziness. 02/12/16   Maren Reamer, MD  methocarbamol (ROBAXIN) 500 MG tablet Take 1 tablet (500 mg total) by mouth 4 (four) times daily. X 1 week then prn muscle spasm 03/19/16   Maren Reamer, MD  metoprolol succinate (TOPROL-XL) 25 MG 24 hr tablet Take 1 tablet (25 mg total) by mouth daily. 03/19/16   Maren Reamer, MD  naproxen (NAPROSYN) 500 MG tablet Take 1 tablet (500 mg total) by mouth daily as needed. With food 03/19/16   Maren Reamer, MD  omeprazole (PRILOSEC) 20 MG capsule TAKE ONE CAPSULE BY MOUTH TWICE A DAY BEFORE A MEAL 08/15/15  Tresa Garter, MD  omeprazole (PRILOSEC) 20 MG capsule TAKE ONE CAPSULE BY MOUTH TWICE A DAY BEFORE A MEAL 03/14/16   Mahala Menghini, PA-C  tamsulosin (FLOMAX) 0.4 MG CAPS capsule TAKE 1 CAPSULE BY MOUTH DAILY 02/06/16   Maren Reamer, MD  traZODone (DESYREL) 100 MG tablet Take 100 mg by mouth at bedtime.    Historical Provider, MD     Objective:   Vitals:   03/19/16 1450  BP: (!) 158/100  Pulse: 85  Resp: 16  Temp: 97.9 F (36.6 C)  TempSrc: Oral  SpO2: 98%  Weight: 242 lb 9.6 oz (110 kg)    Exam General appearance : Awake, alert, not in any distress. Speech Clear. Not toxic looking, poor eye contact (baseline), morbid obese. HEENT: Atraumatic and Normocephalic, pupils equally reactive to light. Neck: supple, no JVD.  No cervical lymphadenopathy.  Chest:Good air entry bilaterally, no added sounds. CVS: S1 S2 regular, no murmurs/gallups or rubs. Abdomen: Bowel sounds active, obese, Non tender and not distended with no gaurding, rigidity or rebound. Extremities: B/L Lower Ext shows no edema, both legs are warm to touch Neurology: Awake alert, and oriented X 3, CN II-XII grossly intact, Non focal Skin:No Rash  Data Review Lab Results  Component Value Date   HGBA1C 5.1 03/19/2016   HGBA1C 5.70 03/07/2014    Depression screen [facility] 2/9 02/29/2016 02/12/2016 01/12/2016 11/07/2015 06/30/2015  Decreased Interest 2 1 0 3 0  Down, Depressed, Hopeless 3 3 3 3 3   PHQ - 2 Score 5 4 3 6 3   Altered sleeping 0 2 2 3 3   Tired, decreased energy 2 2 3 2 3   Change in appetite 0 3 3 3  0  Feeling bad or failure about yourself  0 0 0 0 0  Trouble concentrating 2 1 0 0 0  Moving slowly or fidgety/restless 3 3 3  0 3  Suicidal thoughts 0 0 0 0 0  PHQ-9 Score 12 15 14 14 12       Assessment & Plan   1. Hypertension, unspecified type Uncontrolled, advised dash/salt 000mg  /day diet - CBC with Differential - increase metoprolol 25qd - repeat bp w/ RN Carilyn Goodpasture in 2 wks. If sbp >130, and hr >55, than increase metoprolol xl to 37.5 po qd. And rechk bp in 2 wks thereafter. - -metoprolol succinate (TOPROL-XL) 25 MG 24 hr tablet; Take 1 tablet (25 mg total) by mouth daily.  Dispense: 30 tablet; Refill: 2  2. Dizziness Hx of bpv in past on prn meclizince; related to HTN?  3. Right-sided low back pain with right-sided sciatica, unspecified chronicity - back exercises /stretches provided, warm heat prn/RICE instructions - renewed naproxen and prn robaxin, added voltarin gel prn as well. - continue neurontin 300tid, renewed - - naproxen (NAPROSYN) 500 MG tablet; Take 1 tablet (500 mg total) by mouth daily as needed. With food  Dispense: 60 tablet; Refill: 0 - methocarbamol (ROBAXIN) 500 MG tablet; Take 1 tablet (500 mg total) by  mouth 4 (four) times daily. X 1 week then prn muscle spasm  Dispense: 90 tablet; Refill: 0   4. Breast cancer screening - MM Digital Screening; Future  5. Need for hepatitis C screening test - Hepatitis C antibody  6. Encounter for screening for HIV - HIV antibody (with reflex)  7. Diabetes mellitus screening - HgB A1c     Patient have been counseled extensively about nutrition and exercise  Return in about 2 months (around 05/17/2016) for dizziness / htn.  The patient was given clear instructions to go to ER or return to medical center if symptoms don't improve, worsen or new problems develop. The patient verbalized understanding. The patient was told to call to get lab results if they haven't heard anything in the next week.   This note has been created with Surveyor, quantity. Any transcriptional errors are unintentional.   Maren Reamer, MD, [facility] [place], [place]   03/19/2016, 5:42 PM

## 2016-03-20 LAB — HEPATITIS C ANTIBODY: HCV Ab: NEGATIVE

## 2016-03-20 LAB — HIV ANTIBODY (ROUTINE TESTING W REFLEX): HIV 1&2 Ab, 4th Generation: NONREACTIVE

## 2016-03-26 ENCOUNTER — Other Ambulatory Visit: Payer: Self-pay | Admitting: Pharmacist

## 2016-03-26 MED ORDER — VOLTAREN 1 % TD GEL: 2.0000 g | Freq: Four times a day (QID) | TRANSDERMAL | 2 refills | Status: DC

## 2016-03-27 MED FILL — VOLTAREN 1% GEL: 1 | 13 days supply | Qty: 100 | Fill #0

## 2016-04-02 ENCOUNTER — Other Ambulatory Visit: Payer: Self-pay | Admitting: Internal Medicine

## 2016-04-02 ENCOUNTER — Ambulatory Visit: Payer: Medicare HMO | Attending: Internal Medicine | Admitting: *Deleted

## 2016-04-02 VITALS — BP 159/84 | HR 80 | Resp 16 | Wt 249.2 lb

## 2016-04-02 DIAGNOSIS — I10 Essential (primary) hypertension: Secondary | ICD-10-CM

## 2016-04-02 MED ORDER — BACLOFEN 10 MG PO TABS: 10.0000 mg | ORAL_TABLET | Freq: Two times a day (BID) | ORAL | 2 refills | Status: DC | PRN

## 2016-04-02 MED FILL — BACLOFEN 10 MG TABLET: 10 | 20 days supply | Qty: 40 | Fill #0

## 2016-04-02 MED FILL — NAPROXEN 500 MG TABLET: 500 | 30 days supply | Qty: 30 | Fill #0

## 2016-04-02 MED FILL — METOPROLOL SUCC ER 25 MG TA: 25 | 30 days supply | Qty: 30 | Fill #0

## 2016-04-02 NOTE — Progress Notes (Signed)
Pt arrived to [facility] f/u BP check.  States she does not have medications therefore she is not taking them.  She states she did not realize that medications were at the pharmacy. Pt advised to pick up those medications she was prescribed from pharmacy and to return in 2 weeks after taking the medication for BP check. Pt advised of the  blood pressure goal. Pt verbalized understanding. Carilyn Goodpasture, RN

## 2016-04-09 MED FILL — GABAPENTIN 300 MG CAPSULE: 300 | 30 days supply | Qty: 90 | Fill #0

## 2016-04-09 MED FILL — VOLTAREN 1% GEL: 1 | 13 days supply | Qty: 100 | Fill #1

## 2016-04-09 MED FILL — OMEPRAZOLE DR 20 MG CAPSULE: 20 | 30 days supply | Qty: 60 | Fill #1

## 2016-04-10 DIAGNOSIS — F331 Major depressive disorder, recurrent, moderate: Secondary | ICD-10-CM | POA: Diagnosis not present

## 2016-04-16 ENCOUNTER — Ambulatory Visit: Payer: Medicare HMO | Attending: Internal Medicine | Admitting: *Deleted

## 2016-04-16 VITALS — BP 144/84 | HR 84

## 2016-04-16 DIAGNOSIS — I10 Essential (primary) hypertension: Secondary | ICD-10-CM

## 2016-04-16 NOTE — Patient Instructions (Signed)
-   repeat bp with nurse Carilyn Goodpasture  in 2 wks. increase metoprolol xl to 37.5 by mouth daily .

## 2016-04-17 NOTE — Progress Notes (Signed)
Pt here for f/u BP check. Pt denies chest pain, SOB, HA, new vison concerns, or generalized swelling.  Verified medications with patient and she states she has been taking medications as prescribed. Blood pressure taken manually while patient is sitting. BP 144/84.     Medication adjustments per Dr. Janne Napoleon made. Pt informed of new changes to BP regimen. Pt verbalized understanding.  Nurse visit will be routed to provider.Carilyn Goodpasture, RN, BSN

## 2016-04-18 ENCOUNTER — Telehealth: Payer: Self-pay | Admitting: Internal Medicine

## 2016-04-18 NOTE — Telephone Encounter (Signed)
Patient came to the office to inform PCP that she fell down and hurt her back this morning while at the gas station. Pt is taking medication and hopes that it will help her with the pain. Please follow up.  Thank you.

## 2016-04-22 NOTE — Telephone Encounter (Signed)
Will forward to pcp

## 2016-04-22 NOTE — Telephone Encounter (Signed)
I attempted to call home nbr, went to [employer] payment system (? Not certain about this). Please call patient to see how she is doing. If worsen pain, than have her make fu appt w/ [place] or w/ PA/NPs, thanks

## 2016-04-23 NOTE — Telephone Encounter (Signed)
Could you call patient and schedule an appointment

## 2016-04-24 ENCOUNTER — Telehealth: Payer: Self-pay | Admitting: *Deleted

## 2016-04-24 ENCOUNTER — Other Ambulatory Visit: Payer: Self-pay | Admitting: Internal Medicine

## 2016-04-24 DIAGNOSIS — I10 Essential (primary) hypertension: Secondary | ICD-10-CM

## 2016-04-24 MED ORDER — METOPROLOL SUCCINATE ER 25 MG PO TB24: 37.5000 mg | ORAL_TABLET | Freq: Every day | ORAL | 2 refills | Status: DC

## 2016-04-24 MED FILL — METOPROLOL SUCC ER 25 MG TA: 25 | 30 days supply | Qty: 45 | Fill #0

## 2016-04-24 NOTE — Telephone Encounter (Signed)
Pt informed new dose of BP medication has been sent to the pharmacy. Instructed to f/u for BP check 2 weeks after taking new dose. She has an apt on 05/16/16 with PCP.

## 2016-04-27 ENCOUNTER — Encounter (HOSPITAL_COMMUNITY): Payer: Self-pay | Admitting: Emergency Medicine

## 2016-04-27 ENCOUNTER — Emergency Department (HOSPITAL_COMMUNITY)
Admission: EM | Admit: 2016-04-27 | Discharge: 2016-04-27 | Disposition: A | Discharge status: Home / Self Care | Payer: Medicare HMO | Attending: Emergency Medicine | Admitting: Emergency Medicine

## 2016-04-27 DIAGNOSIS — Z87891 Personal history of nicotine dependence: Secondary | ICD-10-CM | POA: Insufficient documentation

## 2016-04-27 DIAGNOSIS — I10 Essential (primary) hypertension: Secondary | ICD-10-CM | POA: Diagnosis not present

## 2016-04-27 DIAGNOSIS — E119 Type 2 diabetes mellitus without complications: Secondary | ICD-10-CM | POA: Insufficient documentation

## 2016-04-27 DIAGNOSIS — X58XXXA Exposure to other specified factors, initial encounter: Secondary | ICD-10-CM | POA: Diagnosis not present

## 2016-04-27 DIAGNOSIS — Y9389 Activity, other specified: Secondary | ICD-10-CM | POA: Diagnosis not present

## 2016-04-27 DIAGNOSIS — Z7982 Long term (current) use of aspirin: Secondary | ICD-10-CM | POA: Insufficient documentation

## 2016-04-27 DIAGNOSIS — Y999 Unspecified external cause status: Secondary | ICD-10-CM | POA: Diagnosis not present

## 2016-04-27 DIAGNOSIS — Y92512 Supermarket, store or market as the place of occurrence of the external cause: Secondary | ICD-10-CM | POA: Diagnosis not present

## 2016-04-27 DIAGNOSIS — M549 Dorsalgia, unspecified: Secondary | ICD-10-CM

## 2016-04-27 DIAGNOSIS — M546 Pain in thoracic spine: Secondary | ICD-10-CM | POA: Diagnosis present

## 2016-04-27 DIAGNOSIS — Z8673 Personal history of transient ischemic attack (TIA), and cerebral infarction without residual deficits: Secondary | ICD-10-CM | POA: Insufficient documentation

## 2016-04-27 DIAGNOSIS — Z79899 Other long term (current) drug therapy: Secondary | ICD-10-CM | POA: Diagnosis not present

## 2016-04-27 HISTORY — DX: Type 2 diabetes mellitus without complications: E11.9

## 2016-04-27 NOTE — ED Triage Notes (Signed)
Pt reports r/shoulder and mid back pain since last night. Stated that she was at a store and pulled the door open with her r/hand and felt a pull in r/shoulder.

## 2016-04-27 NOTE — Discharge Instructions (Signed)
Please follow up with your orthopedic surgeon regarding today's visit. Please use Tylenol as needed for pain. Can also use ice/warm compresses 15-20 minutes 3-5 times a day.   Contact a health care provider if: Your pain is getting worse. Your pain is not relieved with medicines. You lose function in the area of the pain if the pain is in your arms, legs, or neck.

## 2016-04-27 NOTE — ED Provider Notes (Signed)
[facility] DEPT Provider Note   CSN: [id number] Arrival date & time: 04/27/16  1348   By signing my name below, I, Soijett Blue, attest that this documentation has been prepared under the direction and in the presence of Heath Lark, PA-C Electronically Signed: [place], ED Scribe. 04/27/16. 3:13 PM.  History   Chief Complaint Chief Complaint  Patient presents with  . Back Pain  . Shoulder Pain    HPI Lorraine Ryan is a 61 y.o. female with a PMHx of back pain, DM, HTN, who presents to the Emergency Department complaining of 10/10, sharp, intermittent, right shoulder pain onset last night. Pt reports associated chronic lower back pain that is being managed by her orthopedist. Pt has tried Rx gabapentin and naprosyn with relief of her symptoms. She notes that she opened a door last night with her right hand when she felt an immediate pull to her right shoulder. She denies fever, chills, difficulty urinating, dysuria, frequency, and any other symptoms. She notes that she does have an orthopedist.    The history is provided by the patient. No language interpreter was used.    Past Medical History:  Diagnosis Date  . Anxiety   . Back pain   . Bipolar disorder ([place])   . Chronic headaches   . Dementia   . Depression   . Diabetes mellitus without complication ([place])   . GERD (gastroesophageal reflux disease)   . Hypertension    No meds prescribed; states intermittent  . Neuromuscular disorder ([employer])   . Shortness of breath dyspnea   . Stroke [facility])    caused numbness in leg    Patient Active Problem List   Diagnosis Date Noted  . Depression   . Anxiety   . Abdominal pain, epigastric 03/30/2015  . Encounter for screening colonoscopy 03/30/2015  . Dysphagia 03/30/2015  . History of lumbar laminectomy for spinal cord decompression 02/08/2015  . Orthostatic hypotension 04/20/2014  . Hyperlipidemia 04/20/2014  . Low back pain radiating to right leg 03/01/2013  .  Numbness in right leg 03/01/2013  . Chest pain 03/01/2013  . Hypertension   . Back pain   . Stroke [facility])     Past Surgical History:  Procedure Laterality Date  . ABDOMINAL HYSTERECTOMY    . COLONOSCOPY WITH PROPOFOL N/A 04/18/2015   WUJ:[id number] hemorrhoids/diverticulosis sigmoid colon/  . ESOPHAGOGASTRODUODENOSCOPY (EGD) WITH PROPOFOL N/A 04/18/2015   SLF:web in the proximal esophagus/dilated/  . GANGLION CYST EXCISION    . LUMBAR LAMINECTOMY/DECOMPRESSION MICRODISCECTOMY N/A 02/08/2015   Procedure: L4-5 Decompression;  Surgeon: Marybelle Killings, MD;  Location: [facility];  Service: Orthopedics;  Laterality: N/A;  . POLYPECTOMY  04/18/2015   Procedure: POLYPECTOMY;  Surgeon: Danie Binder, MD;  [facility];  Service: Endoscopy;;  ascending colon polyp  . TONSILLECTOMY      OB History    No data available       Home Medications    Prior to Admission medications   Medication Sig Start Date End Date Taking? Authorizing Provider  acetaminophen (TYLENOL) 325 MG tablet Take 650 mg by mouth every 6 (six) hours as needed for mild pain.    Historical Provider, MD  aspirin EC 81 MG tablet Take 1 tablet (81 mg total) by mouth daily. 03/01/13   Lelon Perla, MD  Aspirin-Salicylamide-Caffeine (BC HEADACHE POWDER PO) Take 1 packet by mouth daily as needed (headache).    Historical Provider, MD  baclofen (LIORESAL) 10 MG tablet Take 1 tablet (10  mg total) by mouth 2 (two) times daily as needed for muscle spasms. 04/02/16   Maren Reamer, MD  DULoxetine (CYMBALTA) 30 MG capsule Take 30 mg by mouth daily.    Historical Provider, MD  gabapentin (NEURONTIN) 300 MG capsule Take 1 capsule (300 mg total) by mouth 3 (three) times daily. 03/19/16   Maren Reamer, MD  lurasidone (LATUDA) 20 MG TABS tablet Take 20 mg by mouth every evening.     Historical Provider, MD  magnesium hydroxide (MILK OF MAGNESIA) 400 MG/5ML suspension Take 15 mLs by mouth at bedtime as needed for mild  constipation. Patient not taking: Reported on 12/22/2015 08/10/14   [name], MD  meclizine (ANTIVERT) 12.5 MG tablet Take 1 tablet (12.5 mg total) by mouth 3 (three) times daily as needed for dizziness. 02/12/16   Maren Reamer, MD  metoprolol succinate (TOPROL-XL) 25 MG 24 hr tablet Take 1.5 tablets (37.5 mg total) by mouth daily. 04/24/16   Maren Reamer, MD  naproxen (NAPROSYN) 500 MG tablet Take 1 tablet (500 mg total) by mouth daily as needed. With food 03/19/16   Maren Reamer, MD  omeprazole (PRILOSEC) 20 MG capsule TAKE ONE CAPSULE BY MOUTH TWICE A DAY BEFORE A MEAL 08/15/15   Tresa Garter, MD  omeprazole (PRILOSEC) 20 MG capsule TAKE ONE CAPSULE BY MOUTH TWICE A DAY BEFORE A MEAL 03/14/16   Mahala Menghini, PA-C  traZODone (DESYREL) 100 MG tablet Take 100 mg by mouth at bedtime.    Historical Provider, MD  VOLTAREN 1 % GEL Apply 2 g topically 4 (four) times daily. 03/26/16   Maren Reamer, MD    Family History Family History  Problem Relation Age of Onset  . Diabetes Mother   . Hypertension Mother   . Heart disease      No family history  . Colon cancer Neg Hx     Social History Social History  Substance Use Topics  . Smoking status: Former Smoker    Quit date: 03/29/1985  . Smokeless tobacco: Never Used  . Alcohol use No     Comment: Used to drink heavily, no ETOH in "30-some years"     Allergies   Codeine; Oxycodone; Prednisone; and Tramadol   Review of Systems Review of Systems  Constitutional: Negative for chills and fever.  Genitourinary: Negative for difficulty urinating, dysuria and frequency.  Musculoskeletal: Positive for arthralgias (right shoulder) and back pain (chronic lower).     Physical Exam Updated Vital Signs BP 132/90   Pulse 85   Temp 98.1 F (36.7 C) (Oral)   Resp 15   SpO2 98%   Physical Exam  Constitutional: She appears well-developed and well-nourished.  Well appearing  HENT:  Head: Normocephalic and atraumatic.   Nose: Nose normal.  Eyes: Conjunctivae and EOM are normal.  Neck: Normal range of motion.  Cardiovascular: Normal rate, regular rhythm and normal heart sounds.  Exam reveals no gallop and no friction rub.   No murmur heard. Intact distal pulses to BUE  Pulmonary/Chest: Effort normal and breath sounds normal. No respiratory distress. She has no wheezes. She has no rales.  Normal work of breathing. No respiratory distress noted.   Abdominal: Soft.  Musculoskeletal: Normal range of motion.       Right shoulder: She exhibits normal range of motion, no tenderness and no swelling.       Cervical back: She exhibits tenderness. She exhibits no bony tenderness.  Thoracic back: She exhibits tenderness. She exhibits no bony tenderness.  Tenderness to right thoracic and tenderness over right trapezius muscle. No midline cervical or thoracic spinal tenderness. Right shoulder without redness, swelling, discoloration, or TTP. Right shoulder with FROM.   Neurological: She is alert.  sensation intact to bilateral upper extremities. Muscle strength 5/5 to BUE.  Skin: Skin is warm.  Psychiatric: She has a normal mood and affect. Her behavior is normal.  Nursing note and vitals reviewed.    ED Treatments / Results  DIAGNOSTIC STUDIES: Oxygen Saturation is 98% on RA, nl by my interpretation.    COORDINATION OF CARE: 2:46 PM Discussed treatment plan with pt at bedside which includes ice, warm compresses, and pt agreed to plan.   Procedures Procedures (including critical care time)  Medications Ordered in ED Medications - No data to display   Initial Impression / Assessment and Plan / ED Course  I have reviewed the triage vital signs and the nursing notes.  Patient here was likely muscular skeletal pain/strain to the right upper back started last night. Low suspicion for any fractures or dislocations. She has good range of motion of her right shoulder and neck. There is tenderness over  trapezius muscle. Patient had good muscle strength against resistance to shoulder flexion and extension, sensation intact, distal pulses intact to upper extremities bilaterally. I do not think that the patient needs a right shoulder x-ray at this time due to history and exam. She is in no apparent distress here, hemodynamically stable, afebrile. She also mentioned her unchanged chronic low back pain that is being managed by her orthopedic. Patient will be discharged with instructions to take Tylenol for pain and to use supportive therapy to her right upper back. Patient instructed to discontinue naproxen due to her reported history of gastric ulcers. Patient given instruction to follow up with orthopedic surgeon regarding today's visit. I feel patient is safe for discharge at this time. Reasons to immediately return to ED discussed.   Final Clinical Impressions(s) / ED Diagnoses   Final diagnoses:  Upper back pain on right side    New Prescriptions Discharge Medication List as of 04/27/2016  3:14 PM     I personally performed the services described in this documentation, which was scribed in my presence. The recorded information has been reviewed and is accurate.    [place], [place] 04/27/16 [place], MD 04/27/16 1535

## 2016-05-08 MED FILL — BACLOFEN 10 MG TABLET: 10 | 20 days supply | Qty: 40 | Fill #1

## 2016-05-08 MED FILL — NAPROXEN 500 MG TABLET: 500 | 30 days supply | Qty: 30 | Fill #1

## 2016-05-20 ENCOUNTER — Ambulatory Visit: Payer: Medicaid Other | Admitting: Internal Medicine

## 2016-05-21 MED FILL — METOPROLOL SUCC ER 25 MG TA: 25 | 30 days supply | Qty: 45 | Fill #1

## 2016-06-04 ENCOUNTER — Encounter: Payer: Self-pay | Admitting: Internal Medicine

## 2016-06-04 ENCOUNTER — Ambulatory Visit: Payer: Medicare HMO | Attending: Internal Medicine | Admitting: Internal Medicine

## 2016-06-04 VITALS — BP 169/92 | HR 61 | Temp 97.7°F | Wt 245.6 lb

## 2016-06-04 DIAGNOSIS — Z888 Allergy status to other drugs, medicaments and biological substances status: Secondary | ICD-10-CM | POA: Insufficient documentation

## 2016-06-04 DIAGNOSIS — Z1231 Encounter for screening mammogram for malignant neoplasm of breast: Secondary | ICD-10-CM | POA: Diagnosis not present

## 2016-06-04 DIAGNOSIS — M25511 Pain in right shoulder: Secondary | ICD-10-CM | POA: Diagnosis not present

## 2016-06-04 DIAGNOSIS — F329 Major depressive disorder, single episode, unspecified: Secondary | ICD-10-CM | POA: Diagnosis not present

## 2016-06-04 DIAGNOSIS — Z1239 Encounter for other screening for malignant neoplasm of breast: Secondary | ICD-10-CM

## 2016-06-04 DIAGNOSIS — G8929 Other chronic pain: Secondary | ICD-10-CM | POA: Insufficient documentation

## 2016-06-04 DIAGNOSIS — M549 Dorsalgia, unspecified: Secondary | ICD-10-CM | POA: Insufficient documentation

## 2016-06-04 DIAGNOSIS — F32A Depression, unspecified: Secondary | ICD-10-CM

## 2016-06-04 DIAGNOSIS — E559 Vitamin D deficiency, unspecified: Secondary | ICD-10-CM

## 2016-06-04 DIAGNOSIS — R52 Pain, unspecified: Secondary | ICD-10-CM | POA: Diagnosis not present

## 2016-06-04 DIAGNOSIS — F419 Anxiety disorder, unspecified: Secondary | ICD-10-CM | POA: Diagnosis not present

## 2016-06-04 DIAGNOSIS — R42 Dizziness and giddiness: Secondary | ICD-10-CM | POA: Insufficient documentation

## 2016-06-04 DIAGNOSIS — E119 Type 2 diabetes mellitus without complications: Secondary | ICD-10-CM | POA: Diagnosis not present

## 2016-06-04 DIAGNOSIS — K219 Gastro-esophageal reflux disease without esophagitis: Secondary | ICD-10-CM | POA: Insufficient documentation

## 2016-06-04 DIAGNOSIS — Z8673 Personal history of transient ischemic attack (TIA), and cerebral infarction without residual deficits: Secondary | ICD-10-CM | POA: Insufficient documentation

## 2016-06-04 DIAGNOSIS — I10 Essential (primary) hypertension: Secondary | ICD-10-CM

## 2016-06-04 DIAGNOSIS — Z79899 Other long term (current) drug therapy: Secondary | ICD-10-CM | POA: Diagnosis not present

## 2016-06-04 DIAGNOSIS — R51 Headache: Secondary | ICD-10-CM | POA: Insufficient documentation

## 2016-06-04 DIAGNOSIS — F0390 Unspecified dementia without behavioral disturbance: Secondary | ICD-10-CM | POA: Insufficient documentation

## 2016-06-04 MED ORDER — LISINOPRIL-HYDROCHLOROTHIAZIDE 10-12.5 MG PO TABS: 1.0000 | ORAL_TABLET | Freq: Every day | ORAL | 3 refills | Status: DC

## 2016-06-04 MED ORDER — BACLOFEN 10 MG PO TABS: 10.0000 mg | ORAL_TABLET | Freq: Two times a day (BID) | ORAL | 2 refills | Status: DC | PRN

## 2016-06-04 MED ORDER — NAPROXEN 500 MG PO TABS: 500.0000 mg | ORAL_TABLET | Freq: Every day | ORAL | 2 refills | Status: DC | PRN

## 2016-06-04 MED ORDER — FAMOTIDINE 20 MG PO TABS: 20.0000 mg | ORAL_TABLET | Freq: Two times a day (BID) | ORAL | 1 refills | Status: DC | PRN

## 2016-06-04 MED FILL — NAPROXEN 500 MG TABLET: 500 | 60 days supply | Qty: 60 | Fill #0

## 2016-06-04 MED FILL — OMEPRAZOLE DR 20 MG CAPSULE: 20 | 30 days supply | Qty: 60 | Fill #2

## 2016-06-04 MED FILL — GABAPENTIN 300 MG CAPSULE: 300 | 30 days supply | Qty: 90 | Fill #1

## 2016-06-04 MED FILL — LISINOPRIL-HCTZ 10-12.5 MG: 10-12.5 | 90 days supply | Qty: 90 | Fill #0

## 2016-06-04 MED FILL — BACLOFEN 10 MG TABLET: 10 | 20 days supply | Qty: 40 | Fill #2

## 2016-06-04 NOTE — Progress Notes (Signed)
Lorraine Ryan, is a 61 y.o. female  KGM:[id number]  [id number]:[id number]  DOB - Jan 17, 1956  Chief Complaint  Patient presents with  . Hypertension  . Dizziness        Subjective:   Lorraine Ryan is a 61 y.o. female here today for a follow up visit for htn, morbid obesity, depression, chronic back pain.  Hx of lumber laminectomy for spinal cord decompression in past. Of note, recent ED visit 04/27/16 for upper back pain, right shoulder pain - pain bit better, but still persist.  Denies gerd.  Of note, she has stopped numerous meds, including asa 81 (felt "weird"), gabapentin ("made her sleepy and upset stomach"), not taking prilosec currently, stopped Latuda ('dizziness', felt weird).  Still going to [facility], she does not recall what medicine she is taking currently dispensed by them.   Patient has No headache, No chest pain, No abdominal pain - No Nausea, No new weakness tingling or numbness, No Cough - SOB.  No problems updated.  ALLERGIES: Allergies  Allergen Reactions  . Gabapentin Other (See Comments)    Sleepiness, upset stomach  . Codeine Other (See Comments)    Headache  . Oxycodone Nausea Only and Palpitations  . Prednisone Other (See Comments)    Headache  . Tramadol Nausea Only    PAST MEDICAL HISTORY: Past Medical History:  Diagnosis Date  . Anxiety   . Back pain   . Bipolar disorder ([place])   . Chronic headaches   . Dementia   . Depression   . Diabetes mellitus without complication ([place])   . GERD (gastroesophageal reflux disease)   . Hypertension    No meds prescribed; states intermittent  . Neuromuscular disorder ([place])   . Shortness of breath dyspnea   . Stroke [facility])    caused numbness in leg    MEDICATIONS AT HOME: Prior to Admission medications   Medication Sig Start Date End Date Taking? Authorizing Provider  acetaminophen (TYLENOL) 325 MG tablet Take 650 mg by mouth every 6 (six) hours as needed for mild pain.   Yes Historical  Provider, MD  baclofen (LIORESAL) 10 MG tablet Take 1 tablet (10 mg total) by mouth 2 (two) times daily as needed for muscle spasms. 06/04/16  Yes Maren Reamer, MD  DULoxetine (CYMBALTA) 30 MG capsule Take 30 mg by mouth daily.   Yes Historical Provider, MD  meclizine (ANTIVERT) 12.5 MG tablet Take 1 tablet (12.5 mg total) by mouth 3 (three) times daily as needed for dizziness. 02/12/16  Yes Maren Reamer, MD  metoprolol succinate (TOPROL-XL) 25 MG 24 hr tablet Take 1.5 tablets (37.5 mg total) by mouth daily. 04/24/16  Yes Maren Reamer, MD  naproxen (NAPROSYN) 500 MG tablet Take 1 tablet (500 mg total) by mouth daily as needed for moderate pain or headache. With food 06/04/16  Yes Maren Reamer, MD  traZODone (DESYREL) 100 MG tablet Take 100 mg by mouth at bedtime.   Yes Historical Provider, MD  VOLTAREN 1 % GEL Apply 2 g topically 4 (four) times daily. 03/26/16  Yes Maren Reamer, MD  famotidine (PEPCID) 20 MG tablet Take 1 tablet (20 mg total) by mouth 2 (two) times daily as needed for heartburn or indigestion. 06/04/16 06/04/17  Maren Reamer, MD  lisinopril-hydrochlorothiazide (PRINZIDE,ZESTORETIC) 10-12.5 MG tablet Take 1 tablet by mouth daily. 06/04/16   Maren Reamer, MD  magnesium hydroxide (MILK OF MAGNESIA) 400 MG/5ML suspension Take 15 mLs by mouth at bedtime as  needed for mild constipation. Patient not taking: Reported on 12/22/2015 08/10/14   Lorraine Marek, MD     Objective:   Vitals:   06/04/16 0827  BP: (!) 169/92  Pulse: 61  Temp: 97.7 F (36.5 C)  TempSrc: Oral  SpO2: 98%  Weight: 245 lb 9.6 oz (111.4 kg)    Exam General appearance : Awake, alert, not in any distress. Speech Clear. Not toxic looking, morbid obese, poor eye  Contact. HEENT: Atraumatic and Normocephalic, pupils equally reactive to light. Neck: supple, no JVD.  Chest:Good air entry bilaterally, no added sounds. CVS: S1 S2 regular, no murmurs/gallups or rubs. Abdomen: Bowel sounds active, Non  tender, obese, nonttp.  Extremities: B/L Lower Ext shows no edema, both legs are warm to touch; rom intact right shoulder, only minimal discomfort against active resistance in shoulder.   Neurology: Awake alert, and oriented X 3, CN II-XII grossly intact, Non focal Skin:No Rash  Data Review Lab Results  Component Value Date   HGBA1C 5.1 03/19/2016   HGBA1C 5.70 03/07/2014    Depression screen [facility] 2/9 02/29/2016 02/12/2016 01/12/2016 11/07/2015 06/30/2015  Decreased Interest 2 1 0 3 0  Down, Depressed, Hopeless 3 3 3 3 3   PHQ - 2 Score 5 4 3 6 3   Altered sleeping 0 2 2 3 3   Tired, decreased energy 2 2 3 2 3   Change in appetite 0 3 3 3  0  Feeling bad or failure about yourself  0 0 0 0 0  Trouble concentrating 2 1 0 0 0  Moving slowly or fidgety/restless 3 3 3  0 3  Suicidal thoughts 0 0 0 0 0  PHQ-9 Score 12 15 14 14 12       Assessment & Plan   1. Hypertension, unspecified type - remains elevated, low salt diet encouraged. - continue metoprolol xl 37.5mg  qd - add prinzide 10-12.5 qday - Lipid Panel - rn followup w/ Lorraine Ryan - 2 wks for bp check, if sbp remains >130, than increase prinzide to 20-25 qd.  thanks  2. Chronic shoulder /back pains - likely more msk on exam. - back stretches /exercises provided, d/w pt warm heat. - naproxen (NAPROSYN) 500 MG tablet; Take 1 tablet (500 mg total) by mouth daily as needed for moderate pain or headache. With food  Dispense: 60 tablet; Refill: 2 - on cymbalta - but not prescribed from our clinic  3. Breast cancer screening - MM Digital Screening; Future  4. Vitamin D deficiency - VITAMIN D 25 Hydroxy (Vit-D Deficiency, Fractures)  5. Depression/anxiety Encouraged f/u w/ [facility], she use to be on latuda, but told me she is no longer taking. - on trazodone per [facility].  6. Hx of gerd - but per pt, currently not taking any ppi, watching her diet better, avoid tomato sauce/pizza  Patient have been counseled extensively about nutrition  and exercise  Return in about 3 months (around 09/04/2016), or if symptoms worsen or fail to improve.  The patient was given clear instructions to go to ER or return to medical center if symptoms don't improve, worsen or new problems develop. The patient verbalized understanding. The patient was told to call to get lab results if they haven't heard anything in the next week.   This note has been created with Surveyor, quantity. Any transcriptional errors are unintentional.   Maren Reamer, MD, [facility], [place]   06/04/2016, 9:09 AM

## 2016-06-04 NOTE — Patient Instructions (Addendum)
Lorraine Munson RN bp check 2 wks -   Calcium 1200 mg/ day Vit D - depends on labs today Get your Mammogram!!   -  Low-Sodium Eating Plan Sodium, which is an element that makes up salt, helps you maintain a healthy balance of fluids in your body. Too much sodium can increase your blood pressure and cause fluid and waste to be held in your body. Your health care provider or dietitian may recommend following this plan if you have high blood pressure (hypertension), kidney disease, liver disease, or heart failure. Eating less sodium can help lower your blood pressure, reduce swelling, and protect your heart, liver, and kidneys. What are tips for following this plan? General guidelines   Most people on this plan should limit their sodium intake to 1,500-2,000 mg (milligrams) of sodium each day. Reading food labels   The Nutrition Facts label lists the amount of sodium in one serving of the food. If you eat more than one serving, you must multiply the listed amount of sodium by the number of servings.  Choose foods with less than 140 mg of sodium per serving.  Avoid foods with 300 mg of sodium or more per serving. Shopping   Look for lower-sodium products, often labeled as "low-sodium" or "no salt added."  Always check the sodium content even if foods are labeled as "unsalted" or "no salt added".  Buy fresh foods.  Avoid canned foods and premade or frozen meals.  Avoid canned, cured, or processed meats  Buy breads that have less than 80 mg of sodium per slice. Cooking   Eat more home-cooked food and less restaurant, buffet, and fast food.  Avoid adding salt when cooking. Use salt-free seasonings or herbs instead of table salt or sea salt. Check with your health care provider or pharmacist before using salt substitutes.  Cook with plant-based oils, such as canola, sunflower, or olive oil. Meal planning   When eating at a restaurant, ask that your food be prepared with less salt or no  salt, if possible.  Avoid foods that contain MSG (monosodium glutamate). MSG is sometimes added to [place] food, bouillon, and some canned foods. What foods are recommended? The items listed may not be a complete list. Talk with your dietitian about what dietary choices are best for you. Grains  Low-sodium cereals, including oats, puffed wheat and rice, and shredded wheat. Low-sodium crackers. Unsalted rice. Unsalted pasta. Low-sodium bread. Whole-grain breads and whole-grain pasta. Vegetables  Fresh or frozen vegetables. "No salt added" canned vegetables. "No salt added" tomato sauce and paste. Low-sodium or reduced-sodium tomato and vegetable juice. Fruits  Fresh, frozen, or canned fruit. Fruit juice. Meats and other protein foods  Fresh or frozen (no salt added) meat, poultry, seafood, and fish. Low-sodium canned tuna and salmon. Unsalted nuts. Dried peas, beans, and lentils without added salt. Unsalted canned beans. Eggs. Unsalted nut butters. Dairy  Milk. Soy milk. Cheese that is naturally low in sodium, such as ricotta cheese, fresh mozzarella, or Swiss cheese Low-sodium or reduced-sodium cheese. Cream cheese. Yogurt. Fats and oils  Unsalted butter. Unsalted margarine with no trans fat. Vegetable oils such as canola or olive oils. Seasonings and other foods  Fresh and dried herbs and spices. Salt-free seasonings. Low-sodium mustard and ketchup. Sodium-free salad dressing. Sodium-free light mayonnaise. Fresh or refrigerated horseradish. Lemon juice. Vinegar. Homemade, reduced-sodium, or low-sodium soups. Unsalted popcorn and pretzels. Low-salt or salt-free chips. What foods are not recommended? The items listed may not be a complete list.  Talk with your dietitian about what dietary choices are best for you. Grains  Instant hot cereals. Bread stuffing, pancake, and biscuit mixes. Croutons. Seasoned rice or pasta mixes. Noodle soup cups. Boxed or frozen macaroni and cheese. Regular salted  crackers. Self-rising flour. Vegetables  Sauerkraut, pickled vegetables, and relishes. Olives. [place] fries. Onion rings. Regular canned vegetables (not low-sodium or reduced-sodium). Regular canned tomato sauce and paste (not low-sodium or reduced-sodium). Regular tomato and vegetable juice (not low-sodium or reduced-sodium). Frozen vegetables in sauces. Meats and other protein foods  Meat or fish that is salted, canned, smoked, spiced, or pickled. Bacon, ham, sausage, hotdogs, corned beef, chipped beef, packaged lunch meats, salt pork, jerky, pickled herring, anchovies, regular canned tuna, sardines, salted nuts. Dairy  Processed cheese and cheese spreads. Cheese curds. Blue cheese. Feta cheese. String cheese. Regular cottage cheese. Buttermilk. Canned milk. Fats and oils  Salted butter. Regular margarine. Ghee. Bacon fat. Seasonings and other foods  Onion salt, garlic salt, seasoned salt, table salt, and sea salt. Canned and packaged gravies. Worcestershire sauce. Tartar sauce. Barbecue sauce. Teriyaki sauce. Soy sauce, including reduced-sodium. Steak sauce. Fish sauce. Oyster sauce. Cocktail sauce. Horseradish that you find on the shelf. Regular ketchup and mustard. Meat flavorings and tenderizers. Bouillon cubes. Hot sauce and Tabasco sauce. Premade or packaged marinades. Premade or packaged taco seasonings. Relishes. Regular salad dressings. Salsa. Potato and tortilla chips. Corn chips and puffs. Salted popcorn and pretzels. Canned or dried soups. Pizza. Frozen entrees and pot pies. Summary  Eating less sodium can help lower your blood pressure, reduce swelling, and protect your heart, liver, and kidneys.  Most people on this plan should limit their sodium intake to 1,500-2,000 mg (milligrams) of sodium each day.  Canned, boxed, and frozen foods are high in sodium. Restaurant foods, fast foods, and pizza are also very high in sodium. You also get sodium by adding salt to food.  Try to cook  at home, eat more fresh fruits and vegetables, and eat less fast food, canned, processed, or prepared foods. This information is not intended to replace advice given to you by your health care provider. Make sure you discuss any questions you have with your health care provider. Document Released: 07/13/2001 Document Revised: 01/15/2016 Document Reviewed: 01/15/2016 Elsevier Interactive Patient Education  2017 [employer]  -  Hypertension Hypertension is another name for high blood pressure. High blood pressure forces your heart to work harder to pump blood. This can cause problems over time. There are two numbers in a blood pressure reading. There is a top number (systolic) over a bottom number (diastolic). It is best to have a blood pressure below 120/80. Healthy choices can help lower your blood pressure. You may need medicine to help lower your blood pressure if:  Your blood pressure cannot be lowered with healthy choices.  Your blood pressure is higher than 130/80. Follow these instructions at home: Eating and drinking   If directed, follow the DASH eating plan. This diet includes:  Filling half of your plate at each meal with fruits and vegetables.  Filling one quarter of your plate at each meal with whole grains. Whole grains include whole wheat pasta, brown rice, and whole grain bread.  Eating or drinking low-fat dairy products, such as skim milk or low-fat yogurt.  Filling one quarter of your plate at each meal with low-fat (lean) proteins. Low-fat proteins include fish, skinless chicken, eggs, beans, and tofu.  Avoiding fatty meat, cured and processed meat, or chicken with skin.  Avoiding premade or processed food.  Eat less than 1,500 mg of salt (sodium) a day.  Limit alcohol use to no more than 1 drink a day for nonpregnant women and 2 drinks a day for men. One drink equals 12 oz of beer, 5 oz of wine, or 1 oz of hard liquor. Lifestyle   Work with your doctor to  stay at a healthy weight or to lose weight. Ask your doctor what the best weight is for you.  Get at least 30 minutes of exercise that causes your heart to beat faster (aerobic exercise) most days of the week. This may include walking, swimming, or biking.  Get at least 30 minutes of exercise that strengthens your muscles (resistance exercise) at least 3 days a week. This may include lifting weights or pilates.  Do not use any products that contain nicotine or tobacco. This includes cigarettes and e-cigarettes. If you need help quitting, ask your doctor.  Check your blood pressure at home as told by your doctor.  Keep all follow-up visits as told by your doctor. This is important. Medicines   Take over-the-counter and prescription medicines only as told by your doctor. Follow directions carefully.  Do not skip doses of blood pressure medicine. The medicine does not work as well if you skip doses. Skipping doses also puts you at risk for problems.  Ask your doctor about side effects or reactions to medicines that you should watch for. Contact a doctor if:  You think you are having a reaction to the medicine you are taking.  You have headaches that keep coming back (recurring).  You feel dizzy.  You have swelling in your ankles.  You have trouble with your vision. Get help right away if:  You get a very bad headache.  You start to feel confused.  You feel weak or numb.  You feel faint.  You get very bad pain in your:  Chest.  Belly (abdomen).  You throw up (vomit) more than once.  You have trouble breathing. Summary  Hypertension is another name for high blood pressure.  Making healthy choices can help lower blood pressure. If your blood pressure cannot be controlled with healthy choices, you may need to take medicine. This information is not intended to replace advice given to you by your health care provider. Make sure you discuss any questions you have with your  health care provider. Document Released: 07/10/2007 Document Revised: 12/20/2015 Document Reviewed: 12/20/2015 Elsevier Interactive Patient Education  2017 [place].  -=  Back Exercises If you have pain in your back, do these exercises 2-3 times each day or as told by your doctor. When the pain goes away, do the exercises once each day, but repeat the steps more times for each exercise (do more repetitions). If you do not have pain in your back, do these exercises once each day or as told by your doctor. Exercises Single Knee to Chest   Do these steps 3-5 times in a row for each leg: 1. Lie on your back on a firm bed or the floor with your legs stretched out. 2. Bring one knee to your chest. 3. Hold your knee to your chest by grabbing your knee or thigh. 4. Pull on your knee until you feel a gentle stretch in your lower back. 5. Keep doing the stretch for 10-30 seconds. 6. Slowly let go of your leg and straighten it. Pelvic Tilt   Do these steps 5-10 times in a row:  1. Lie on your back on a firm bed or the floor with your legs stretched out. 2. Bend your knees so they point up to the ceiling. Your feet should be flat on the floor. 3. Tighten your lower belly (abdomen) muscles to press your lower back against the floor. This will make your tailbone point up to the ceiling instead of pointing down to your feet or the floor. 4. Stay in this position for 5-10 seconds while you gently tighten your muscles and breathe evenly. Cat-Cow   Do these steps until your lower back bends more easily: 1. Get on your hands and knees on a firm surface. Keep your hands under your shoulders, and keep your knees under your hips. You may put padding under your knees. 2. Let your head hang down, and make your tailbone point down to the floor so your lower back is round like the back of a cat. 3. Stay in this position for 5 seconds. 4. Slowly lift your head and make your tailbone point up to the ceiling  so your back hangs low (sags) like the back of a cow. 5. Stay in this position for 5 seconds. Press-Ups   Do these steps 5-10 times in a row: 1. Lie on your belly (face-down) on the floor. 2. Place your hands near your head, about shoulder-width apart. 3. While you keep your back relaxed and keep your hips on the floor, slowly straighten your arms to raise the top half of your body and lift your shoulders. Do not use your back muscles. To make yourself more comfortable, you may change where you place your hands. 4. Stay in this position for 5 seconds. 5. Slowly return to lying flat on the floor. Bridges   Do these steps 10 times in a row: 1. Lie on your back on a firm surface. 2. Bend your knees so they point up to the ceiling. Your feet should be flat on the floor. 3. Tighten your butt muscles and lift your butt off of the floor until your waist is almost as high as your knees. If you do not feel the muscles working in your butt and the back of your thighs, slide your feet 1-2 inches farther away from your butt. 4. Stay in this position for 3-5 seconds. 5. Slowly lower your butt to the floor, and let your butt muscles relax. If this exercise is too easy, try doing it with your arms crossed over your chest. Belly Crunches   Do these steps 5-10 times in a row: 1. Lie on your back on a firm bed or the floor with your legs stretched out. 2. Bend your knees so they point up to the ceiling. Your feet should be flat on the floor. 3. Cross your arms over your chest. 4. Tip your chin a little bit toward your chest but do not bend your neck. 5. Tighten your belly muscles and slowly raise your chest just enough to lift your shoulder blades a tiny bit off of the floor. 6. Slowly lower your chest and your head to the floor. Back Lifts  Do these steps 5-10 times in a row: 1. Lie on your belly (face-down) with your arms at your sides, and rest your forehead on the floor. 2. Tighten the muscles in  your legs and your butt. 3. Slowly lift your chest off of the floor while you keep your hips on the floor. Keep the back of your head in line with the curve  in your back. Look at the floor while you do this. 4. Stay in this position for 3-5 seconds. 5. Slowly lower your chest and your face to the floor. Contact a doctor if:  Your back pain gets a lot worse when you do an exercise.  Your back pain does not lessen 2 hours after you exercise. If you have any of these problems, stop doing the exercises. Do not do them again unless your doctor says it is okay. Get help right away if:  You have sudden, very bad back pain. If this happens, stop doing the exercises. Do not do them again unless your doctor says it is okay. This information is not intended to replace advice given to you by your health care provider. Make sure you discuss any questions you have with your health care provider. Document Released: 02/23/2010 Document Revised: 06/29/2015 Document Reviewed: 03/17/2014 Elsevier Interactive Patient Education  2017 [place] Maintenance for Postmenopausal Women Menopause is a normal process in which your reproductive ability comes to an end. This process happens gradually over a span of months to years, usually between the ages of 94 and 64. Menopause is complete when you have missed 12 consecutive menstrual periods. It is important to talk with your health care provider about some of the most common conditions that affect postmenopausal women, such as heart disease, cancer, and bone loss (osteoporosis). Adopting a healthy lifestyle and getting preventive care can help to promote your health and wellness. Those actions can also lower your chances of developing some of these common conditions. What should I know about menopause? During menopause, you may experience a number of symptoms, such as:  Moderate-to-severe hot flashes.  Night sweats.  Decrease in sex drive.  Mood  swings.  Headaches.  Tiredness.  Irritability.  Memory problems.  Insomnia. Choosing to treat or not to treat menopausal changes is an individual decision that you make with your health care provider. What should I know about hormone replacement therapy and supplements? Hormone therapy products are effective for treating symptoms that are associated with menopause, such as hot flashes and night sweats. Hormone replacement carries certain risks, especially as you become older. If you are thinking about using estrogen or estrogen with progestin treatments, discuss the benefits and risks with your health care provider. What should I know about heart disease and stroke? Heart disease, heart attack, and stroke become more likely as you age. This may be due, in part, to the hormonal changes that your body experiences during menopause. These can affect how your body processes dietary fats, triglycerides, and cholesterol. Heart attack and stroke are both medical emergencies. There are many things that you can do to help prevent heart disease and stroke:  Have your blood pressure checked at least every 1-2 years. High blood pressure causes heart disease and increases the risk of stroke.  If you are 89-28 years old, ask your health care provider if you should take aspirin to prevent a heart attack or a stroke.  Do not use any tobacco products, including cigarettes, chewing tobacco, or electronic cigarettes. If you need help quitting, ask your health care provider.  It is important to eat a healthy diet and maintain a healthy weight.  Be sure to include plenty of vegetables, fruits, low-fat dairy products, and lean protein.  Avoid eating foods that are high in solid fats, added sugars, or salt (sodium).  Get regular exercise. This is one of the most important things that you  can do for your health.  Try to exercise for at least 150 minutes each week. The type of exercise that you do should  increase your heart rate and make you sweat. This is known as moderate-intensity exercise.  Try to do strengthening exercises at least twice each week. Do these in addition to the moderate-intensity exercise.  Know your numbers.Ask your health care provider to check your cholesterol and your blood glucose. Continue to have your blood tested as directed by your health care provider. What should I know about cancer screening? There are several types of cancer. Take the following steps to reduce your risk and to catch any cancer development as early as possible. Breast Cancer  Practice breast self-awareness.  This means understanding how your breasts normally appear and feel.  It also means doing regular breast self-exams. Let your health care provider know about any changes, no matter how small.  If you are 7 or older, have a clinician do a breast exam (clinical breast exam or CBE) every year. Depending on your age, family history, and medical history, it may be recommended that you also have a yearly breast X-ray (mammogram).  If you have a family history of breast cancer, talk with your health care provider about genetic screening.  If you are at high risk for breast cancer, talk with your health care provider about having an MRI and a mammogram every year.  Breast cancer (BRCA) gene test is recommended for women who have family members with BRCA-related cancers. Results of the assessment will determine the need for genetic counseling and BRCA1 and for BRCA2 testing. BRCA-related cancers include these types:  Breast. This occurs in males or females.  Ovarian.  Tubal. This may also be called fallopian tube cancer.  Cancer of the abdominal or pelvic lining (peritoneal cancer).  Prostate.  Pancreatic. Cervical, Uterine, and Ovarian Cancer  Your health care provider may recommend that you be screened regularly for cancer of the pelvic organs. These include your ovaries, uterus, and  vagina. This screening involves a pelvic exam, which includes checking for microscopic changes to the surface of your cervix (Pap test).  For women ages 21-65, health care providers may recommend a pelvic exam and a Pap test every three years. For women ages 21-65, they may recommend the Pap test and pelvic exam, combined with testing for human papilloma virus (HPV), every five years. Some types of HPV increase your risk of cervical cancer. Testing for HPV may also be done on women of any age who have unclear Pap test results.  Other health care providers may not recommend any screening for nonpregnant women who are considered low risk for pelvic cancer and have no symptoms. Ask your health care provider if a screening pelvic exam is right for you.  If you have had past treatment for cervical cancer or a condition that could lead to cancer, you need Pap tests and screening for cancer for at least 20 years after your treatment. If Pap tests have been discontinued for you, your risk factors (such as having a new sexual partner) need to be reassessed to determine if you should start having screenings again. Some women have medical problems that increase the chance of getting cervical cancer. In these cases, your health care provider may recommend that you have screening and Pap tests more often.  If you have a family history of uterine cancer or ovarian cancer, talk with your health care provider about genetic screening.  If you have  vaginal bleeding after reaching menopause, tell your health care provider.  There are currently no reliable tests available to screen for ovarian cancer. Lung Cancer  Lung cancer screening is recommended for adults 63-89 years old who are at high risk for lung cancer because of a history of smoking. A yearly low-dose CT scan of the lungs is recommended if you:  Currently smoke.  Have a history of at least 30 pack-years of smoking and you currently smoke or have quit  within the past 15 years. A pack-year is smoking an average of one pack of cigarettes per day for one year. Yearly screening should:  Continue until it has been 15 years since you quit.  Stop if you develop a health problem that would prevent you from having lung cancer treatment. Colorectal Cancer  This type of cancer can be detected and can often be prevented.  Routine colorectal cancer screening usually begins at age 67 and continues through age 56.  If you have risk factors for colon cancer, your health care provider may recommend that you be screened at an earlier age.  If you have a family history of colorectal cancer, talk with your health care provider about genetic screening.  Your health care provider may also recommend using home test kits to check for hidden blood in your stool.  A small camera at the end of a tube can be used to examine your colon directly (sigmoidoscopy or colonoscopy). This is done to check for the earliest forms of colorectal cancer.  Direct examination of the colon should be repeated every 5-10 years until age 16. However, if early forms of precancerous polyps or small growths are found or if you have a family history or genetic risk for colorectal cancer, you may need to be screened more often. Skin Cancer  Check your skin from head to toe regularly.  Monitor any moles. Be sure to tell your health care provider:  About any new moles or changes in moles, especially if there is a change in a mole's shape or color.  If you have a mole that is larger than the size of a pencil eraser.  If any of your family members has a history of skin cancer, especially at a young age, talk with your health care provider about genetic screening.  Always use sunscreen. Apply sunscreen liberally and repeatedly throughout the day.  Whenever you are outside, protect yourself by wearing long sleeves, pants, a wide-brimmed hat, and sunglasses. What should I know about  osteoporosis? Osteoporosis is a condition in which bone destruction happens more quickly than new bone creation. After menopause, you may be at an increased risk for osteoporosis. To help prevent osteoporosis or the bone fractures that can happen because of osteoporosis, the following is recommended:  If you are 76-66 years old, get at least 1,000 mg of calcium and at least 600 mg of vitamin D per day.  If you are older than age 23 but younger than age 2, get at least 1,200 mg of calcium and at least 600 mg of vitamin D per day.  If you are older than age 59, get at least 1,200 mg of calcium and at least 800 mg of vitamin D per day. Smoking and excessive alcohol intake increase the risk of osteoporosis. Eat foods that are rich in calcium and vitamin D, and do weight-bearing exercises several times each week as directed by your health care provider. What should I know about how menopause affects my mental  health? Depression may occur at any age, but it is more common as you become older. Common symptoms of depression include:  Low or sad mood.  Changes in sleep patterns.  Changes in appetite or eating patterns.  Feeling an overall lack of motivation or enjoyment of activities that you previously enjoyed.  Frequent crying spells. Talk with your health care provider if you think that you are experiencing depression. What should I know about immunizations? It is important that you get and maintain your immunizations. These include:  Tetanus, diphtheria, and pertussis (Tdap) booster vaccine.  Influenza every year before the flu season begins.  Pneumonia vaccine.  Shingles vaccine. Your health care provider may also recommend other immunizations. This information is not intended to replace advice given to you by your health care provider. Make sure you discuss any questions you have with your health care provider. Document Released: 03/15/2005 Document Revised: 08/11/2015 Document  Reviewed: 10/25/2014 Elsevier Interactive Patient Education  2017 Elsevier Inc.  -  Back Pain, Adult Back pain is very common. The pain often gets better over time. The cause of back pain is usually not dangerous. Most people can learn to manage their back pain on their own. Follow these instructions at home: Watch your back pain for any changes. The following actions may help to lessen any pain you are feeling:  Stay active. Start with short walks on flat ground if you can. Try to walk farther each day.  Exercise regularly as told by your doctor. Exercise helps your back heal faster. It also helps avoid future injury by keeping your muscles strong and flexible.  Do not sit, drive, or stand in one place for more than 30 minutes.  Do not stay in bed. Resting more than 1-2 days can slow down your recovery.  Be careful when you bend or lift an object. Use good form when lifting:  Bend at your knees.  Keep the object close to your body.  Do not twist.  Sleep on a firm mattress. Lie on your side, and bend your knees. If you lie on your back, put a pillow under your knees.  Take medicines only as told by your doctor.  Put ice on the injured area.  Put ice in a plastic bag.  Place a towel between your skin and the bag.  Leave the ice on for 20 minutes, 2-3 times a day for the first 2-3 days. After that, you can switch between ice and heat packs.  Avoid feeling anxious or stressed. Find good ways to deal with stress, such as exercise.  Maintain a healthy weight. Extra weight puts stress on your back. Contact a doctor if:  You have pain that does not go away with rest or medicine.  You have worsening pain that goes down into your legs or buttocks.  You have pain that does not get better in one week.  You have pain at night.  You lose weight.  You have a fever or chills. Get help right away if:  You cannot control when you poop (bowel movement) or pee (urinate).  Your  arms or legs feel weak.  Your arms or legs lose feeling (numbness).  You feel sick to your stomach (nauseous) or throw up (vomit).  You have belly (abdominal) pain.  You feel like you may pass out (faint). This information is not intended to replace advice given to you by your health care provider. Make sure you discuss any questions you have with your health  care provider. Document Released: 07/10/2007 Document Revised: 06/29/2015 Document Reviewed: 05/25/2013 Elsevier Interactive Patient Education  2017 [employer].

## 2016-06-05 ENCOUNTER — Telehealth: Payer: Self-pay

## 2016-06-05 ENCOUNTER — Other Ambulatory Visit: Payer: Self-pay | Admitting: Internal Medicine

## 2016-06-05 LAB — VITAMIN D 25 HYDROXY (VIT D DEFICIENCY, FRACTURES): Vit D, 25-Hydroxy: 22.1 ng/mL — ABNORMAL LOW (ref 30.0–100.0)

## 2016-06-05 LAB — LIPID PANEL
CHOL/HDL RATIO: 3.4 ratio (ref 0.0–4.4)
Cholesterol, Total: 237 mg/dL — ABNORMAL HIGH (ref 100–199)
HDL: 70 mg/dL (ref >39)
LDL CALC: 149 mg/dL — AB (ref 0–99)
Triglycerides: 91 mg/dL (ref 0–149)
VLDL Cholesterol Cal: 18 mg/dL (ref 5–40)

## 2016-06-05 MED ORDER — PRAVASTATIN SODIUM 20 MG PO TABS: 20.0000 mg | ORAL_TABLET | Freq: Every day | ORAL | 3 refills | Status: DC

## 2016-06-05 MED FILL — PRAVASTATIN NA 20 MG TAB: 20 | 30 days supply | Qty: 30 | Fill #0

## 2016-06-05 NOTE — Telephone Encounter (Signed)
Contacted pt to go over lab results pt didn't answer and was unable to lvm  

## 2016-06-19 DIAGNOSIS — F323 Major depressive disorder, single episode, severe with psychotic features: Secondary | ICD-10-CM | POA: Diagnosis not present

## 2016-06-19 DIAGNOSIS — Z79899 Other long term (current) drug therapy: Secondary | ICD-10-CM | POA: Diagnosis not present

## 2016-06-20 ENCOUNTER — Encounter: Payer: Self-pay | Admitting: Internal Medicine

## 2016-07-03 ENCOUNTER — Other Ambulatory Visit: Payer: Self-pay | Admitting: Gastroenterology

## 2016-07-03 ENCOUNTER — Other Ambulatory Visit: Payer: Self-pay | Admitting: Internal Medicine

## 2016-07-03 DIAGNOSIS — I10 Essential (primary) hypertension: Secondary | ICD-10-CM

## 2016-07-03 MED FILL — OMEPRAZOLE DR 20 MG CAPSULE: 20 | 90 days supply | Qty: 180 | Fill #3

## 2016-07-03 MED FILL — PRAVASTATIN NA 20 MG TAB: 20 | 90 days supply | Qty: 90 | Fill #1

## 2016-07-03 MED FILL — METOPROLOL SUCC ER 25 MG TA: 25 | 30 days supply | Qty: 45 | Fill #2

## 2016-07-03 MED FILL — FAMOTIDINE 20 MG TABLET: 20 | 30 days supply | Qty: 60 | Fill #0

## 2016-07-05 ENCOUNTER — Ambulatory Visit: Payer: Medicare HMO | Attending: Internal Medicine | Admitting: Physician Assistant

## 2016-07-05 VITALS — BP 138/86 | HR 70 | Temp 97.9°F | Resp 18 | Ht 66.0 in | Wt 243.0 lb

## 2016-07-05 DIAGNOSIS — K219 Gastro-esophageal reflux disease without esophagitis: Secondary | ICD-10-CM | POA: Insufficient documentation

## 2016-07-05 DIAGNOSIS — M5441 Lumbago with sciatica, right side: Secondary | ICD-10-CM | POA: Diagnosis not present

## 2016-07-05 DIAGNOSIS — M62838 Other muscle spasm: Secondary | ICD-10-CM | POA: Insufficient documentation

## 2016-07-05 DIAGNOSIS — I10 Essential (primary) hypertension: Secondary | ICD-10-CM | POA: Insufficient documentation

## 2016-07-05 DIAGNOSIS — F419 Anxiety disorder, unspecified: Secondary | ICD-10-CM | POA: Insufficient documentation

## 2016-07-05 DIAGNOSIS — R202 Paresthesia of skin: Secondary | ICD-10-CM

## 2016-07-05 DIAGNOSIS — Z79899 Other long term (current) drug therapy: Secondary | ICD-10-CM | POA: Diagnosis not present

## 2016-07-05 DIAGNOSIS — Z8673 Personal history of transient ischemic attack (TIA), and cerebral infarction without residual deficits: Secondary | ICD-10-CM | POA: Insufficient documentation

## 2016-07-05 DIAGNOSIS — F0390 Unspecified dementia without behavioral disturbance: Secondary | ICD-10-CM | POA: Diagnosis not present

## 2016-07-05 DIAGNOSIS — F319 Bipolar disorder, unspecified: Secondary | ICD-10-CM | POA: Insufficient documentation

## 2016-07-05 DIAGNOSIS — Z885 Allergy status to narcotic agent status: Secondary | ICD-10-CM | POA: Insufficient documentation

## 2016-07-05 DIAGNOSIS — E119 Type 2 diabetes mellitus without complications: Secondary | ICD-10-CM | POA: Insufficient documentation

## 2016-07-05 MED ORDER — GABAPENTIN 600 MG PO TABS: 600.0000 mg | ORAL_TABLET | Freq: Three times a day (TID) | ORAL | 3 refills | Status: DC

## 2016-07-05 MED ORDER — METHOCARBAMOL 500 MG PO TABS: 500.0000 mg | ORAL_TABLET | Freq: Four times a day (QID) | ORAL | 1 refills | Status: DC

## 2016-07-05 MED FILL — GABAPENTIN 600 MG TABLET: 600 | 30 days supply | Qty: 90 | Fill #0

## 2016-07-05 NOTE — Patient Instructions (Signed)
Vit D still low. Very low vit d, can cause bone/muscle pain. Buy OTC Vit D 5,000 IU and take daily and Calcium 1200mg  /daily - take both for strong bones. thanks

## 2016-07-05 NOTE — Progress Notes (Signed)
Patient is here for generalized body aches  Patient has taken medication today. Patient has not eaten today.

## 2016-07-05 NOTE — Progress Notes (Signed)
Lorraine Ryan, is a 61 y.o. female  FAO:[id number]  ONG:[id number]  DOB - 1955-10-06  Subjective:  Chief Complaint and HPI: Lorraine Ryan is a 60 y.o. female here today for continued neck pain, lower back pain, and B paresthesias in her upper arms.  These are problems she has been having for years. The pain has not changed over the last years.  She has been swimming which has been helping some.  She feels gabapentin helps some but that she needs a higher dose.  No new weakness.  No new paresthesias.    ROS:   Constitutional:  No f/c, No night sweats, No unexplained weight loss. EENT:  No vision changes, No blurry vision, No hearing changes. No mouth, throat, or ear problems.  Respiratory: No cough, No SOB Cardiac: No CP, no palpitations GI:  No abd pain, No N/V/D. GU: No Urinary s/sx Musculoskeletal: +neck and back pain and all over body numbness Neuro: No headache, no dizziness, no motor weakness.  Skin: No rash Endocrine:  No polydipsia. No polyuria.  Psych: Denies SI/HI  No problems updated.  ALLERGIES: Allergies  Allergen Reactions  . Gabapentin Other (See Comments)    Sleepiness, upset stomach  . Codeine Other (See Comments)    Headache  . Oxycodone Nausea Only and Palpitations  . Prednisone Other (See Comments)    Headache  . Tramadol Nausea Only    PAST MEDICAL HISTORY: Past Medical History:  Diagnosis Date  . Anxiety   . Back pain   . Bipolar disorder ([place])   . Chronic headaches   . Dementia   . Depression   . Diabetes mellitus without complication ([place])   . GERD (gastroesophageal reflux disease)   . Hypertension    No meds prescribed; states intermittent  . Neuromuscular disorder ([place])   . Shortness of breath dyspnea   . Stroke [facility])    caused numbness in leg    MEDICATIONS AT HOME: Prior to Admission medications   Medication Sig Start Date End Date Taking? Authorizing Provider  acetaminophen (TYLENOL) 325 MG tablet Take 650 mg by mouth  every 6 (six) hours as needed for mild pain.   Yes [provider]  DULoxetine (CYMBALTA) 30 MG capsule Take 30 mg by mouth daily.   Yes [provider]  famotidine (PEPCID) 20 MG tablet Take 1 tablet (20 mg total) by mouth 2 (two) times daily as needed for heartburn or indigestion. 06/04/16 06/04/17 Yes Langeland, Dawn T, MD  gabapentin (NEURONTIN) 600 MG tablet Take 1 tablet (600 mg total) by mouth 3 (three) times daily. 07/05/16  Yes Mattson Dayal M, PA-C  lisinopril-hydrochlorothiazide (PRINZIDE,ZESTORETIC) 10-12.5 MG tablet Take 1 tablet by mouth daily. 06/04/16  Yes Langeland, Dawn T, MD  methocarbamol (ROBAXIN) 500 MG tablet Take 1 tablet (500 mg total) by mouth 4 (four) times daily. Prn muscle spasm 07/05/16  Yes Argentina Donovan, PA-C  metoprolol succinate (TOPROL-XL) 25 MG 24 hr tablet TAKE 1 & 1/2 TABLETS BY MOUTH DAILY 07/03/16  Yes Langeland, Dawn T, MD  naproxen (NAPROSYN) 500 MG tablet Take 1 tablet (500 mg total) by mouth daily as needed for moderate pain or headache. With food 06/04/16  Yes Langeland, Dawn T, MD  omeprazole (PRILOSEC) 20 MG capsule TAKE ONE CAPSULE BY MOUTH TWICE A DAY BEFORE A MEAL 07/04/16  Yes Carlis Stable, NP  pravastatin (PRAVACHOL) 20 MG tablet Take 1 tablet (20 mg total) by mouth daily. 06/05/16  Yes Maren Reamer, MD  traZODone (  DESYREL) 100 MG tablet Take 100 mg by mouth at bedtime.   Yes [provider]  VOLTAREN 1 % GEL Apply 2 g topically 4 (four) times daily. 03/26/16  Yes Langeland, Dawn T, MD  magnesium hydroxide (MILK OF MAGNESIA) 400 MG/5ML suspension Take 15 mLs by mouth at bedtime as needed for mild constipation. Patient not taking: Reported on 12/22/2015 08/10/14   [name], MD  meclizine (ANTIVERT) 12.5 MG tablet Take 1 tablet (12.5 mg total) by mouth 3 (three) times daily as needed for dizziness. Patient not taking: Reported on 07/05/2016 02/12/16   Maren Reamer, MD     Objective:  EXAM:   Vitals:   07/05/16 0950    BP: 138/86  Pulse: 70  Resp: 18  Temp: 97.9 F (36.6 C)  TempSrc: Oral  SpO2: 97%  Weight: 243 lb (110.2 kg)  Height: 5\' 6"  (1.676 m)    General appearance : A&OX3. NAD. Non-toxic-appearing HEENT: Atraumatic and Normocephalic.  PERRLA. EOM intact. Neck: supple, no JVD. No cervical lymphadenopathy. No thyromegaly Chest/Lungs:  Breathing-non-labored, Good air entry bilaterally, breath sounds normal without rales, rhonchi, or wheezing  CVS: S1 S2 regular, no murmurs, gallops, rubs  Back and Extremities: Fair mobility overall.  C-spine without bony tenderness.  +trapezius spasm.  LB without lumbar spine TTP.  +paraspinus TTP.  Bilateral Lower Ext shows no edema, both legs are warm to touch with = pulse throughout Neurology:  CN II-XII grossly intact, Non focal.  BUE and BLE DTR= intact B. Psych:  TP linear. J/I WNL. Normal speech. Appropriate eye contact and affect.  Skin:  No Rash  Data Review Lab Results  Component Value Date   HGBA1C 5.1 03/19/2016   HGBA1C 5.70 03/07/2014     Assessment & Plan   1. Hypertension, unspecified type suboptimal control but adequate-continue to work on diet and exercise(she is swimming several days a week). Continue current regimen.  We have discussed target BP range and blood pressure goal. I have advised patient to check BP regularly and to call us back or report to clinic if the numbers are consistently higher than 140/90. We discussed the importance of compliance with medical therapy and DASH diet recommended, consequences of uncontrolled hypertension discussed.   2. Paresthesia Not new-increase dose - gabapentin (NEURONTIN) 600 MG tablet; Take 1 tablet (600 mg total) by mouth 3 (three) times daily.  Dispense: 90 tablet; Refill: 3  3. Right-sided low back pain with right-sided sciatica, unspecified chronicity And neck pain-unchanged No red flags on exam for either continue naproxen  4. Muscle spasm - methocarbamol (ROBAXIN) 500 MG tablet;  Take 1 tablet (500 mg total) by mouth 4 (four) times daily. Prn muscle spasm  Dispense: 120 tablet; Refill: 1   Patient have been counseled extensively about nutrition and exercise  Return in about 3 months (around 10/05/2016) for assign new PCP, f/up BP, vit D.  The patient was given clear instructions to go to ER or return to medical center if symptoms don't improve, worsen or new problems develop. The patient verbalized understanding. The patient was told to call to get lab results if they haven't heard anything in the next week.     Freeman Caldron, PA-C [facility] [place], [place]   07/05/2016, 10:07 AMPatient ID: Lorraine Ryan, female   DOB: 1955/12/19, 61 y.o.   MRN: [id number]

## 2016-07-28 ENCOUNTER — Encounter (HOSPITAL_BASED_OUTPATIENT_CLINIC_OR_DEPARTMENT_OTHER): Payer: Self-pay | Admitting: Emergency Medicine

## 2016-07-28 ENCOUNTER — Emergency Department (HOSPITAL_BASED_OUTPATIENT_CLINIC_OR_DEPARTMENT_OTHER)
Admission: EM | Admit: 2016-07-28 | Discharge: 2016-07-28 | Disposition: A | Discharge status: Home / Self Care | Payer: Medicare HMO | Attending: Emergency Medicine | Admitting: Emergency Medicine

## 2016-07-28 DIAGNOSIS — M549 Dorsalgia, unspecified: Secondary | ICD-10-CM

## 2016-07-28 DIAGNOSIS — Z87891 Personal history of nicotine dependence: Secondary | ICD-10-CM | POA: Diagnosis not present

## 2016-07-28 DIAGNOSIS — M791 Myalgia: Secondary | ICD-10-CM | POA: Insufficient documentation

## 2016-07-28 DIAGNOSIS — I10 Essential (primary) hypertension: Secondary | ICD-10-CM | POA: Diagnosis not present

## 2016-07-28 DIAGNOSIS — M545 Low back pain: Secondary | ICD-10-CM | POA: Diagnosis not present

## 2016-07-28 DIAGNOSIS — M25572 Pain in left ankle and joints of left foot: Secondary | ICD-10-CM | POA: Insufficient documentation

## 2016-07-28 DIAGNOSIS — Z79899 Other long term (current) drug therapy: Secondary | ICD-10-CM | POA: Diagnosis not present

## 2016-07-28 MED ORDER — METHOCARBAMOL 500 MG PO TABS: 1000.0000 mg | ORAL_TABLET | Freq: Four times a day (QID) | ORAL | 0 refills | Status: DC

## 2016-07-28 MED ORDER — ACETAMINOPHEN 500 MG PO TABS: 500.0000 mg | ORAL_TABLET | Freq: Four times a day (QID) | ORAL | 0 refills | Status: DC | PRN

## 2016-07-28 NOTE — Discharge Instructions (Signed)
Please read and follow all provided instructions.  Your diagnoses today include:  1. Musculoskeletal back pain   2. Acute left ankle pain     Tests performed today include:  Vital signs - see below for your results today  Medications prescribed:   Robaxin (methocarbamol) - muscle relaxer medication  DO NOT drive or perform any activities that require you to be awake and alert because this medicine can make you drowsy.   Take any prescribed medications only as directed.  Home care instructions:   Follow any educational materials contained in this packet  Please rest, use ice or heat on your back for the next several days  Do not lift, push, pull anything more than 10 pounds for the next week  Follow-up instructions: Please follow-up with your primary care provider in the next 1 week for further evaluation of your symptoms.   Return instructions:  SEEK IMMEDIATE MEDICAL ATTENTION IF YOU HAVE:  New numbness, tingling, weakness, or problem with the use of your arms or legs  Severe back pain not relieved with medications  Loss control of your bowels or bladder  Increasing pain in any areas of the body (such as chest or abdominal pain)  Shortness of breath, dizziness, or fainting.   Worsening nausea (feeling sick to your stomach), vomiting, fever, or sweats  Any other emergent concerns regarding your health   Additional Information:  Your vital signs today were: BP 115/86 (BP Location: Left Arm)    Temp 98.3 F (36.8 C) (Oral)    Resp 18    Ht 5\' 6"  (1.676 m)    Wt 106.1 kg (234 lb)    SpO2 99%    BMI 37.77 kg/m  If your blood pressure (BP) was elevated above 135/85 this visit, please have this repeated by your doctor within one month. --------------

## 2016-07-28 NOTE — ED Triage Notes (Signed)
Patient states that she tripped yesterday, today she also fell and now her lower back and left ankle is hurting her

## 2016-07-28 NOTE — ED Provider Notes (Signed)
[place] DEPT [facility] Provider Note   CSN: [id number] Arrival date & time: 07/28/16  1208     History   Chief Complaint Chief Complaint  Patient presents with  . Fall    HPI Lorraine Ryan is a 61 y.o. female.  Patient presents with complaint of bilateral lower back pain and left ankle pain. Patient states that she stumbled yesterday and then fell this morning. She was concerned about the amount of pain she was having in her lower back. She did not take any medications prior to arrival for this. She is ambulatory without difficulty. She does not have any numbness or tingling in her lower extremities. Pain is worse with movement and bending. Patient is on gabapentin for chronic pain. Onset of symptoms acute. Course is constant. Nothing makes symptoms better.      Past Medical History:  Diagnosis Date  . Anxiety   . Back pain   . Bipolar disorder ([place])   . Chronic headaches   . Dementia   . Depression   . Diabetes mellitus without complication ([place])   . GERD (gastroesophageal reflux disease)   . Hypertension    No meds prescribed; states intermittent  . Neuromuscular disorder ([place])   . Shortness of breath dyspnea   . Stroke [facility])    caused numbness in leg    Patient Active Problem List   Diagnosis Date Noted  . Depression   . Anxiety   . Abdominal pain, epigastric 03/30/2015  . Encounter for screening colonoscopy 03/30/2015  . Dysphagia 03/30/2015  . History of lumbar laminectomy for spinal cord decompression 02/08/2015  . Orthostatic hypotension 04/20/2014  . Hyperlipidemia 04/20/2014  . Low back pain radiating to right leg 03/01/2013  . Numbness in right leg 03/01/2013  . Chest pain 03/01/2013  . Hypertension   . Back pain   . Stroke [facility])     Past Surgical History:  Procedure Laterality Date  . ABDOMINAL HYSTERECTOMY    . COLONOSCOPY WITH PROPOFOL N/A 04/18/2015   CWC:BJSEGBTD hemorrhoids/diverticulosis sigmoid colon/  . ESOPHAGOGASTRODUODENOSCOPY  (EGD) WITH PROPOFOL N/A 04/18/2015   SLF:web in the proximal esophagus/dilated/  . GANGLION CYST EXCISION    . LUMBAR LAMINECTOMY/DECOMPRESSION MICRODISCECTOMY N/A 02/08/2015   Procedure: L4-5 Decompression;  Surgeon: Marybelle Killings, MD;  Location: [facility];  Service: Orthopedics;  Laterality: N/A;  . POLYPECTOMY  04/18/2015   Procedure: POLYPECTOMY;  Surgeon: Danie Binder, MD;  [facility];  Service: Endoscopy;;  ascending colon polyp  . TONSILLECTOMY      OB History    No data available       Home Medications    Prior to Admission medications   Medication Sig Start Date End Date Taking? Authorizing Provider  acetaminophen (TYLENOL) 500 MG tablet Take 1 tablet (500 mg total) by mouth every 6 (six) hours as needed. 07/28/16   Carlisle Cater, PA-C  DULoxetine (CYMBALTA) 30 MG capsule Take 30 mg by mouth daily.    [provider]  famotidine (PEPCID) 20 MG tablet Take 1 tablet (20 mg total) by mouth 2 (two) times daily as needed for heartburn or indigestion. 06/04/16 06/04/17  Maren Reamer, MD  gabapentin (NEURONTIN) 600 MG tablet Take 1 tablet (600 mg total) by mouth 3 (three) times daily. 07/05/16   Argentina Donovan, PA-C  lisinopril-hydrochlorothiazide (PRINZIDE,ZESTORETIC) 10-12.5 MG tablet Take 1 tablet by mouth daily. 06/04/16   Maren Reamer, MD  methocarbamol (ROBAXIN) 500 MG tablet Take 2 tablets (1,000 mg total)  by mouth 4 (four) times daily. 07/28/16   Carlisle Cater, PA-C  metoprolol succinate (TOPROL-XL) 25 MG 24 hr tablet TAKE 1 & 1/2 TABLETS BY MOUTH DAILY 07/03/16   Langeland, Dawn T, MD  naproxen (NAPROSYN) 500 MG tablet Take 1 tablet (500 mg total) by mouth daily as needed for moderate pain or headache. With food 06/04/16   Lottie Mussel T, MD  omeprazole (PRILOSEC) 20 MG capsule TAKE ONE CAPSULE BY MOUTH TWICE A DAY BEFORE A MEAL 07/04/16   Carlis Stable, NP  pravastatin (PRAVACHOL) 20 MG tablet Take 1 tablet (20 mg total) by mouth daily. 06/05/16   Maren Reamer, MD  traZODone (DESYREL) 100 MG tablet Take 100 mg by mouth at bedtime.    [provider]  VOLTAREN 1 % GEL Apply 2 g topically 4 (four) times daily. 03/26/16   Maren Reamer, MD    Family History Family History  Problem Relation Age of Onset  . Diabetes Mother   . Hypertension Mother   . Heart disease Unknown        No family history  . Colon cancer Neg Hx     Social History Social History  Substance Use Topics  . Smoking status: Former Smoker    Quit date: 03/29/1985  . Smokeless tobacco: Never Used  . Alcohol use No     Comment: Used to drink heavily, no ETOH in "30-some years"     Allergies   Codeine; Oxycodone; Prednisone; and Tramadol   Review of Systems Review of Systems  Constitutional: Negative for activity change.  Musculoskeletal: Positive for arthralgias and back pain. Negative for joint swelling and neck pain.  Skin: Negative for wound.  Neurological: Negative for weakness and numbness.     Physical Exam Updated Vital Signs BP 115/86 (BP Location: Left Arm)   Temp 98.3 F (36.8 C) (Oral)   Resp 18   Ht 5\' 6"  (1.676 m)   Wt 106.1 kg (234 lb)   SpO2 99%   BMI 37.77 kg/m   Physical Exam  Constitutional: She appears well-developed and well-nourished.  HENT:  Head: Normocephalic and atraumatic.  Eyes: Conjunctivae are normal.  Neck: Normal range of motion. Neck supple.  Pulmonary/Chest: Effort normal.  Abdominal: Soft. There is no tenderness. There is no CVA tenderness.  Musculoskeletal: Normal range of motion.       Right knee: Normal.       Left knee: Normal.       Right ankle: Normal.       Left ankle: She exhibits normal range of motion, no swelling and no ecchymosis. Tenderness. No lateral malleolus and no medial malleolus tenderness found.       Cervical back: She exhibits normal range of motion, no tenderness and no bony tenderness.       Thoracic back: She exhibits normal range of motion, no tenderness and no bony  tenderness.       Lumbar back: She exhibits tenderness (Bilateral lumbar paraspinous musculature, palpable spasm). She exhibits normal range of motion and no bony tenderness.       Left lower leg: Normal.       Left foot: Normal.       Feet:  No step-off noted with palpation of spine.   Neurological: She is alert. She has normal strength and normal reflexes. No sensory deficit.  5/5 strength in entire lower extremities bilaterally. No sensation deficit.   Skin: Skin is warm and dry. No rash noted.  Psychiatric:  She has a normal mood and affect.  Nursing note and vitals reviewed.    ED Treatments / Results   Procedures Procedures (including critical care time)  Medications Ordered in ED Medications - No data to display   Initial Impression / Assessment and Plan / ED Course  I have reviewed the triage vital signs and the nursing notes.  Pertinent labs & imaging results that were available during my care of the patient were reviewed by me and considered in my medical decision making (see chart for details).     Patient seen and examined.   Vital signs reviewed and are as follows: BP 115/86 (BP Location: Left Arm)   Temp 98.3 F (36.8 C) (Oral)   Resp 18   Ht 5\' 6"  (1.676 m)   Wt 106.1 kg (234 lb)   SpO2 99%   BMI 37.77 kg/m   Patient counseled on proper use of muscle relaxant medication.  They were told not to drink alcohol, drive any vehicle, or do any dangerous activities while taking this medication.  Patient verbalized understanding.  Encouraged PCP follow-up in one week with continued pain.  Final Clinical Impressions(s) / ED Diagnoses   Final diagnoses:  Musculoskeletal back pain  Acute left ankle pain   Back pain: Likely related to recent fall. No red flags. Reproducible with palpation and movement, suspicious for musculoskeletal pain.  Left ankle pain: Patient is ambulatory. No indication for imaging per [place] ankle rules.  New Prescriptions New  Prescriptions   ACETAMINOPHEN (TYLENOL) 500 MG TABLET    Take 1 tablet (500 mg total) by mouth every 6 (six) hours as needed.   METHOCARBAMOL (ROBAXIN) 500 MG TABLET    Take 2 tablets (1,000 mg total) by mouth 4 (four) times daily.     Carlisle Cater, PA-C 07/28/16 1318    Mesner, Corene Cornea, MD 07/28/16 [phone]

## 2016-07-29 MED FILL — MAPAP 500 MG TABLET: 500 | 25 days supply | Qty: 100 | Fill #0

## 2016-07-29 MED FILL — METHOCARBAMOL 500 MG TABLET: 500 | 3 days supply | Qty: 20 | Fill #0

## 2016-08-05 MED FILL — METOPROLOL SUCC ER 25 MG TA: 25 | 30 days supply | Qty: 45 | Fill #0

## 2016-08-05 MED FILL — GABAPENTIN 600 MG TABLET: 600 | 30 days supply | Qty: 90 | Fill #1

## 2016-08-28 ENCOUNTER — Telehealth: Payer: Self-pay | Admitting: Internal Medicine

## 2016-08-28 DIAGNOSIS — R202 Paresthesia of skin: Secondary | ICD-10-CM

## 2016-08-28 MED ORDER — GABAPENTIN 300 MG PO CAPS: 300.0000 mg | ORAL_CAPSULE | Freq: Three times a day (TID) | ORAL | 3 refills | Status: DC

## 2016-08-28 NOTE — Telephone Encounter (Signed)
Pt requesting a reduction in dosage of gabapentin from 600mg  to 300mg  due to her head feeling dizzy when she takes her meds. Also requesting a reduction in the amount of weight that she is able to lift. Please f/u with pt.

## 2016-08-29 NOTE — Telephone Encounter (Signed)
Amy could call pt and see if she wants to come in for an appointment per DR. Johnson for a uc visit

## 2016-09-03 MED FILL — GABAPENTIN 300 MG CAPSULE: 300 | 30 days supply | Qty: 90 | Fill #0

## 2016-09-04 DIAGNOSIS — F323 Major depressive disorder, single episode, severe with psychotic features: Secondary | ICD-10-CM | POA: Diagnosis not present

## 2016-11-15 ENCOUNTER — Telehealth: Payer: Self-pay | Admitting: Licensed Clinical Social Worker

## 2016-11-15 NOTE — Telephone Encounter (Signed)
LCSWA placed call to pt.   Pt reported that she has ran out of her blood pressure medicine and has discontinued her additional medicine. She stated that she is in need of a new doctor, "but not right now".   LCSWA offered to assist pt in scheduling an appointment with a new provider, since prior PCP is no longer employed at [facility]. Pt reported that she does not want a new doctor and declined assistance obtaining a new PCP.   LCSWA strongly encouraged pt to contact her with any additional questions or concerns. Pt verbalized understanding.

## 2016-11-15 NOTE — Telephone Encounter (Signed)
LCSWA placed call to pt after receiving a handwritten letter requesting an appointment with a Provider. A HIPPA compliant message was left requesting a return call.   LCSWA dialed pt's mobile number on file; however, number is not in service.

## 2016-11-18 ENCOUNTER — Telehealth: Payer: Self-pay

## 2016-11-18 NOTE — Telephone Encounter (Signed)
Contacted pt on all number that were provided on pt file on 11-15-16 was unable to lvm and to get in touch with pt.

## 2017-01-21 MED FILL — METOPROLOL SUCC ER 25 MG TA: 25 | 30 days supply | Qty: 30 | Fill #1

## 2017-01-21 MED FILL — GABAPENTIN 300 MG CAPSULE: 300 | 30 days supply | Qty: 90 | Fill #1

## 2017-01-29 ENCOUNTER — Ambulatory Visit: Payer: Medicare HMO | Attending: Internal Medicine | Admitting: Physician Assistant

## 2017-01-29 ENCOUNTER — Ambulatory Visit: Payer: Medicare HMO | Admitting: Licensed Clinical Social Worker

## 2017-01-29 VITALS — BP 143/89 | HR 66 | Temp 97.5°F | Resp 16 | Wt 232.6 lb

## 2017-01-29 DIAGNOSIS — M62838 Other muscle spasm: Secondary | ICD-10-CM | POA: Diagnosis not present

## 2017-01-29 DIAGNOSIS — M5127 Other intervertebral disc displacement, lumbosacral region: Secondary | ICD-10-CM | POA: Diagnosis not present

## 2017-01-29 DIAGNOSIS — E119 Type 2 diabetes mellitus without complications: Secondary | ICD-10-CM | POA: Diagnosis not present

## 2017-01-29 DIAGNOSIS — R52 Pain, unspecified: Secondary | ICD-10-CM

## 2017-01-29 DIAGNOSIS — F419 Anxiety disorder, unspecified: Secondary | ICD-10-CM

## 2017-01-29 DIAGNOSIS — I10 Essential (primary) hypertension: Secondary | ICD-10-CM | POA: Diagnosis not present

## 2017-01-29 DIAGNOSIS — E785 Hyperlipidemia, unspecified: Secondary | ICD-10-CM | POA: Diagnosis not present

## 2017-01-29 DIAGNOSIS — F319 Bipolar disorder, unspecified: Secondary | ICD-10-CM | POA: Diagnosis not present

## 2017-01-29 DIAGNOSIS — M545 Low back pain: Secondary | ICD-10-CM | POA: Diagnosis present

## 2017-01-29 DIAGNOSIS — Z79899 Other long term (current) drug therapy: Secondary | ICD-10-CM | POA: Diagnosis not present

## 2017-01-29 DIAGNOSIS — M79605 Pain in left leg: Secondary | ICD-10-CM | POA: Insufficient documentation

## 2017-01-29 DIAGNOSIS — R51 Headache: Secondary | ICD-10-CM | POA: Diagnosis not present

## 2017-01-29 DIAGNOSIS — M79604 Pain in right leg: Secondary | ICD-10-CM | POA: Diagnosis present

## 2017-01-29 DIAGNOSIS — E559 Vitamin D deficiency, unspecified: Secondary | ICD-10-CM | POA: Insufficient documentation

## 2017-01-29 DIAGNOSIS — K219 Gastro-esophageal reflux disease without esophagitis: Secondary | ICD-10-CM | POA: Insufficient documentation

## 2017-01-29 DIAGNOSIS — Z8673 Personal history of transient ischemic attack (TIA), and cerebral infarction without residual deficits: Secondary | ICD-10-CM | POA: Diagnosis not present

## 2017-01-29 MED ORDER — METHOCARBAMOL 500 MG PO TABS: 1000.0000 mg | ORAL_TABLET | Freq: Four times a day (QID) | ORAL | 0 refills | Status: DC | PRN

## 2017-01-29 MED ORDER — NAPROXEN 500 MG PO TABS: 500.0000 mg | ORAL_TABLET | Freq: Every day | ORAL | 2 refills | Status: DC | PRN

## 2017-01-29 MED ORDER — CYCLOBENZAPRINE HCL 5 MG PO TABS: ORAL_TABLET | ORAL | 0 refills | Status: DC

## 2017-01-29 MED ORDER — LISINOPRIL-HYDROCHLOROTHIAZIDE 20-25 MG PO TABS: 1.0000 | ORAL_TABLET | Freq: Every day | ORAL | 3 refills | Status: DC

## 2017-01-29 MED FILL — CYCLOBENZAPRINE 5 MG TABLET: 5 | 30 days supply | Qty: 90 | Fill #0

## 2017-01-29 MED FILL — LISINOPRIL-HCTZ 20-25 MG TA: 20-25 | 30 days supply | Qty: 30 | Fill #0

## 2017-01-29 MED FILL — NAPROXEN 500 MG TABLET: 500 | 30 days supply | Qty: 30 | Fill #0

## 2017-01-29 NOTE — Progress Notes (Signed)
Lorraine Ryan, is a 61 y.o. female  VOZ:[id number]  [id number]  DOB - [birth year]  Subjective:  Chief Complaint and HPI: Lorraine Ryan is a 61 y.o. female here today for check up.  Continues to c/o pain in lower back and legs.  Does not want to do PT.  Out of naproxen and robaxin.  These usu help for pain if she takes them.  She has h/o low vitamin D but never took any OTC vitamin D.  Says she is compliant on BP and other meds.  Has a psychiatrist at [facility].  MR L-S spine 09/2015-results-IMPRESSION: 1. No acute fracture of the lumbar spine. 2. At L5-S1 there is a broad-based bulge with a small central disc protrusion. Bilateral lateral recess stenosis, left greater than right. Moderate bilateral facet arthropathy with bilateral mild foraminal stenosis. 3. At L4-5 there is a broad-based disc bulge. Moderate -severe bilateral facet arthropathy. Bilateral lateral recess stenosis and mild spinal stenosis.  No new s/sx today.     ROS:   Constitutional:  No f/c, No night sweats, No unexplained weight loss. EENT:  No vision changes, No blurry vision, No hearing changes. No mouth, throat, or ear problems.  Respiratory: No cough, No SOB Cardiac: No CP, no palpitations GI:  No abd pain, No N/V/D. GU: No Urinary s/sx Musculoskeletal: + back and leg pain Neuro: No headache, no dizziness, no motor weakness.  Skin: No rash Endocrine:  No polydipsia. No polyuria.  Psych: Denies SI/HI  No problems updated.  ALLERGIES: Allergies  Allergen Reactions  . Codeine Other (See Comments)    Headache  . Oxycodone Nausea Only and Palpitations  . Prednisone Other (See Comments)    Headache  . Tramadol Nausea Only    PAST MEDICAL HISTORY: Past Medical History:  Diagnosis Date  . Anxiety   . Back pain   . Bipolar disorder ([place])   . Chronic headaches   . Dementia   . Depression   . Diabetes mellitus without complication ([place])   . GERD (gastroesophageal reflux disease)   .  Hypertension    No meds prescribed; states intermittent  . Neuromuscular disorder ([place])   . Shortness of breath dyspnea   . Stroke [facility])    caused numbness in leg    MEDICATIONS AT HOME: Prior to Admission medications   Medication Sig Start Date End Date Taking? Authorizing Provider  acetaminophen (TYLENOL) 500 MG tablet Take 1 tablet (500 mg total) by mouth every 6 (six) hours as needed. 07/28/16   Carlisle Cater, PA-C  DULoxetine (CYMBALTA) 30 MG capsule Take 30 mg by mouth daily.    [provider]  famotidine (PEPCID) 20 MG tablet Take 1 tablet (20 mg total) by mouth 2 (two) times daily as needed for heartburn or indigestion. 06/04/16 06/04/17  Maren Reamer, MD  gabapentin (NEURONTIN) 300 MG capsule Take 1 capsule (300 mg total) by mouth 3 (three) times daily. 08/28/16   Ladell Pier, MD  lisinopril-hydrochlorothiazide (PRINZIDE,ZESTORETIC) 20-25 MG tablet Take 1 tablet by mouth daily. 01/29/17   Argentina Donovan, PA-C  methocarbamol (ROBAXIN) 500 MG tablet Take 2 tablets (1,000 mg total) by mouth every 6 (six) hours as needed for muscle spasms. 01/29/17   Argentina Donovan, PA-C  metoprolol succinate (TOPROL-XL) 25 MG 24 hr tablet TAKE 1 & 1/2 TABLETS BY MOUTH DAILY 07/03/16   Langeland, Dawn T, MD  naproxen (NAPROSYN) 500 MG tablet Take 1 tablet (500 mg total) by mouth daily as needed for moderate  pain or headache. With food 01/29/17   Freeman Caldron M, PA-C  omeprazole (PRILOSEC) 20 MG capsule TAKE ONE CAPSULE BY MOUTH TWICE A DAY BEFORE A MEAL Patient not taking: Reported on 01/29/2017 07/04/16   Carlis Stable, NP  pravastatin (PRAVACHOL) 20 MG tablet Take 1 tablet (20 mg total) by mouth daily. 06/05/16   Maren Reamer, MD  traZODone (DESYREL) 100 MG tablet Take 100 mg by mouth at bedtime.    [provider]  VOLTAREN 1 % GEL Apply 2 g topically 4 (four) times daily. 03/26/16   Maren Reamer, MD     Objective:  EXAM:   Vitals:   01/29/17 0950  BP:  (!) 143/89  Pulse: 66  Resp: 16  Temp: (!) 97.5 F (36.4 C)  TempSrc: Oral  SpO2: 99%  Weight: 232 lb 9.6 oz (105.5 kg)    General appearance : A&OX3. NAD. Non-toxic-appearing HEENT: Atraumatic and Normocephalic.  PERRLA. EOM intact.   Neck: supple, no JVD. No cervical lymphadenopathy. No thyromegaly Chest/Lungs:  Breathing-non-labored, Good air entry bilaterally, breath sounds normal without rales, rhonchi, or wheezing  CVS: S1 S2 regular, no murmurs, gallops, rubs  Back: TTP throughout(pain outweighs findings).  Neg SLR B. DTR=B.  ROM ~80% of normal.   Extremities: Bilateral Lower Ext shows no edema, both legs are warm to touch with = pulse throughout Neurology:  CN II-XII grossly intact, Non focal.   Psych:  Flat affect, rarely makes eye contact.  Speech is normal, but soft and sometimes muffled.   Skin:  No Rash  Data Review Lab Results  Component Value Date   HGBA1C 5.1 03/19/2016   HGBA1C 5.70 03/07/2014     Assessment & Plan   1. Hypertension, unspecified type Uncontrolled-increase dose - lisinopril-hydrochlorothiazide (PRINZIDE,ZESTORETIC) 20-25 MG tablet; Take 1 tablet by mouth daily.  Dispense: 90 tablet; Refill: 3 - Comprehensive metabolic panel  2. Body aches/LBP-not interested in PT.  Advised home exercise - naproxen (NAPROSYN) 500 MG tablet; Take 1 tablet (500 mg total) by mouth daily as needed for moderate pain or headache. With food  Dispense: 60 tablet; Refill: 2  3. Vitamin D deficiency Never took replacement - Vitamin D, 25-hydroxy  4. Muscle spasm cyclobenzaprine5 mg 1/2- 1 tid prn spasm (methocarbamol was too expensive for patient-we will try this one.) 5. Hyperlipidemia, unspecified hyperlipidemia type - Lipid Panel  Patient have been counseled extensively about nutrition and exercise  Return in about 1 month (around 03/01/2017) for assign new PCP; f/up htn and body aches.  The patient was given clear instructions to go to ER or return to  medical center if symptoms don't improve, worsen or new problems develop. The patient verbalized understanding. The patient was told to call to get lab results if they haven't heard anything in the next week.     Freeman Caldron, PA-C [facility] and [place], [place]   01/29/2017, 10:27 AMPatient ID: Lorraine Ryan, female   DOB: December 03, 1955, 61 y.o.   MRN: [id number]

## 2017-01-29 NOTE — BH Specialist Note (Signed)
Integrated Behavioral Health Initial Visit  MRN: [id number] Name: Lorraine Ryan  Number of [place] Clinician visits:: 1/6 Session Start time: 9:50 AM  Session End time: 10:15 AM Total time: 30 minutes  Type of Service: [place] Interpretor:No. Interpretor Name and Language: N/A   Warm Hand Off Completed.       SUBJECTIVE: Lorraine Ryan is a 61 y.o. female accompanied by self Patient was referred by Weyman Pedro for chronic pain. Patient reports the following symptoms/concerns: anxiety, depression, chronic pain Duration of problem: Ongoing; Severity of problem: moderate  OBJECTIVE: Mood: Anxious and Affect: Constricted Risk of harm to self or others: No plan to harm self or others  LIFE CONTEXT: Family and Social: Pt resides with her adult son. She prefers to be withdrawn from others School/Work: Pt receives disability.  Self-Care: Pt states that "staying to myself decreases my anxiety". She participates in medication management through [facility] and denied substance use Life Changes: Pt is experiencing chronic pain  GOALS ADDRESSED: Patient will: 1. Reduce symptoms of: anxiety and depression 2. Increase knowledge and/or ability of: coping skills and healthy habits  3. Demonstrate ability to: Increase healthy adjustment to current life circumstances and Increase adequate support systems for patient/family  INTERVENTIONS: Interventions utilized: Solution-Focused Strategies, Supportive Counseling, Psychoeducation and/or Health Education and Link to [employer]  Standardized Assessments completed: Patient declined screening  ASSESSMENT: Patient currently experiencing anxiety and depression triggered by chronic pain. She receives support from her adult son, who she resides with.    Patient may benefit from psychoeducation. LCSWA educated pt on the correlation between one's physical and mental health. LCSWA  discussed strategies to cope with chronic pain. Pt participates in medication management through [facility] and reports compliance with medications. She is interested in obtaining independent housing when she receives an increase in her disability. LCSWA provided pt with community resources to assist with housing, food insecurity, and crisis intervention.   PLAN: 1. Follow up with behavioral health clinician on : Pt was encouraged to continue behavioral health services with [facility]. Pt can contact LCSWA if symptoms worsen or fail to improve to schedule behavioral appointments at [facility]. 2. Behavioral recommendations: LCSWA recommends that pt apply healthy coping skills discussed, continue with medication management, and utilize provided resources.  3. Referral(s): Community Resources:  Food and Housing 4. "From scale of 1-10, how likely are you to follow plan?": 5/10  Rebekah Chesterfield, LCSW 01/29/17 1:58 PM

## 2017-01-29 NOTE — Patient Instructions (Signed)
Check blood pressure outside of the office 3 times weekly and record and bring to next visit

## 2017-01-30 ENCOUNTER — Other Ambulatory Visit: Payer: Self-pay | Admitting: Physician Assistant

## 2017-01-30 LAB — COMPREHENSIVE METABOLIC PANEL
ALBUMIN: 4.4 g/dL (ref 3.6–4.8)
ALK PHOS: 89 IU/L (ref 39–117)
ALT: 18 IU/L (ref 0–32)
AST: 19 IU/L (ref 0–40)
Albumin/Globulin Ratio: 1.4 (ref 1.2–2.2)
BILIRUBIN TOTAL: 1.3 mg/dL — AB (ref 0.0–1.2)
BUN / CREAT RATIO: 8 — AB (ref 12–28)
BUN: 6 mg/dL — ABNORMAL LOW (ref 8–27)
CO2: 23 mmol/L (ref 20–29)
CREATININE: 0.71 mg/dL (ref 0.57–1.00)
Calcium: 9.6 mg/dL (ref 8.7–10.3)
Chloride: 103 mmol/L (ref 96–106)
GFR calc Af Amer: 106 mL/min/{1.73_m2} (ref >59)
GFR calc non Af Amer: 92 mL/min/{1.73_m2} (ref >59)
GLOBULIN, TOTAL: 3.1 g/dL (ref 1.5–4.5)
Glucose: 81 mg/dL (ref 65–99)
Potassium: 3.6 mmol/L (ref 3.5–5.2)
SODIUM: 144 mmol/L (ref 134–144)
Total Protein: 7.5 g/dL (ref 6.0–8.5)

## 2017-01-30 LAB — LIPID PANEL
CHOLESTEROL TOTAL: 260 mg/dL — AB (ref 100–199)
Chol/HDL Ratio: 3.5 ratio (ref 0.0–4.4)
HDL: 74 mg/dL (ref >39)
LDL CALC: 169 mg/dL — AB (ref 0–99)
Triglycerides: 87 mg/dL (ref 0–149)
VLDL Cholesterol Cal: 17 mg/dL (ref 5–40)

## 2017-01-30 LAB — VITAMIN D 25 HYDROXY (VIT D DEFICIENCY, FRACTURES): Vit D, 25-Hydroxy: 18.1 ng/mL — ABNORMAL LOW (ref 30.0–100.0)

## 2017-01-30 MED ORDER — VITAMIN D (ERGOCALCIFEROL) 1.25 MG (50000 UNIT) PO CAPS: 50000.0000 [IU] | ORAL_CAPSULE | ORAL | 0 refills | Status: DC

## 2017-02-03 ENCOUNTER — Telehealth: Payer: Self-pay

## 2017-02-03 NOTE — Telephone Encounter (Signed)
Contacted pt on all numbers provided and was unable to reach pt and was unable to lvm  If pt calls back please give results: vitamin D is low. This can contribute to muscle aches, anxiety, fatigue, and depression. I have sent a prescription to the pharmacy for you to take once a week. We will recheck this level in 3-4 months. Your cholesterol is high. Make sure your are taking your Pravastatin every day and eat a healthy diet. Your other labs are normal.

## 2017-02-13 DIAGNOSIS — F323 Major depressive disorder, single episode, severe with psychotic features: Secondary | ICD-10-CM | POA: Diagnosis not present

## 2017-02-24 MED FILL — METOPROLOL SUCC ER 25 MG TA: 25 | 30 days supply | Qty: 30 | Fill #2

## 2017-02-24 MED FILL — GABAPENTIN 300 MG CAPSULE: 300 | 30 days supply | Qty: 90 | Fill #2

## 2017-02-24 MED FILL — LISINOPRIL-HCTZ 20-25 MG TA: 20-25 | 30 days supply | Qty: 30 | Fill #1

## 2017-03-04 ENCOUNTER — Other Ambulatory Visit: Payer: Self-pay | Admitting: Physician Assistant

## 2017-03-04 DIAGNOSIS — M62838 Other muscle spasm: Secondary | ICD-10-CM

## 2017-03-04 MED FILL — CYCLOBENZAPRINE 5 MG TABLET: 5 | 30 days supply | Qty: 90 | Fill #0

## 2017-03-06 ENCOUNTER — Ambulatory Visit: Payer: Medicare HMO | Admitting: Internal Medicine

## 2017-03-07 ENCOUNTER — Emergency Department
Admission: EM | Admit: 2017-03-07 | Discharge: 2017-03-07 | Disposition: A | Discharge status: Home / Self Care | Payer: Medicare HMO | Attending: Emergency Medicine | Admitting: Emergency Medicine

## 2017-03-07 ENCOUNTER — Other Ambulatory Visit: Payer: Self-pay

## 2017-03-07 ENCOUNTER — Emergency Department (HOSPITAL_BASED_OUTPATIENT_CLINIC_OR_DEPARTMENT_OTHER): Payer: No Typology Code available for payment source

## 2017-03-07 ENCOUNTER — Encounter (HOSPITAL_BASED_OUTPATIENT_CLINIC_OR_DEPARTMENT_OTHER): Payer: Self-pay

## 2017-03-07 ENCOUNTER — Emergency Department (HOSPITAL_BASED_OUTPATIENT_CLINIC_OR_DEPARTMENT_OTHER)
Admission: EM | Admit: 2017-03-07 | Discharge: 2017-03-07 | Disposition: A | Discharge status: Home / Self Care | Payer: No Typology Code available for payment source | Attending: Emergency Medicine | Admitting: Emergency Medicine

## 2017-03-07 DIAGNOSIS — F319 Bipolar disorder, unspecified: Secondary | ICD-10-CM | POA: Insufficient documentation

## 2017-03-07 DIAGNOSIS — F419 Anxiety disorder, unspecified: Secondary | ICD-10-CM | POA: Insufficient documentation

## 2017-03-07 DIAGNOSIS — Z79899 Other long term (current) drug therapy: Secondary | ICD-10-CM | POA: Insufficient documentation

## 2017-03-07 DIAGNOSIS — F0390 Unspecified dementia without behavioral disturbance: Secondary | ICD-10-CM | POA: Diagnosis not present

## 2017-03-07 DIAGNOSIS — Z87891 Personal history of nicotine dependence: Secondary | ICD-10-CM | POA: Insufficient documentation

## 2017-03-07 DIAGNOSIS — F329 Major depressive disorder, single episode, unspecified: Secondary | ICD-10-CM | POA: Diagnosis not present

## 2017-03-07 DIAGNOSIS — I10 Essential (primary) hypertension: Secondary | ICD-10-CM | POA: Insufficient documentation

## 2017-03-07 DIAGNOSIS — K2970 Gastritis, unspecified, without bleeding: Secondary | ICD-10-CM | POA: Insufficient documentation

## 2017-03-07 DIAGNOSIS — Z8673 Personal history of transient ischemic attack (TIA), and cerebral infarction without residual deficits: Secondary | ICD-10-CM | POA: Insufficient documentation

## 2017-03-07 DIAGNOSIS — R0602 Shortness of breath: Secondary | ICD-10-CM | POA: Diagnosis present

## 2017-03-07 DIAGNOSIS — R109 Unspecified abdominal pain: Secondary | ICD-10-CM | POA: Diagnosis not present

## 2017-03-07 DIAGNOSIS — E119 Type 2 diabetes mellitus without complications: Secondary | ICD-10-CM | POA: Diagnosis not present

## 2017-03-07 DIAGNOSIS — R0600 Dyspnea, unspecified: Secondary | ICD-10-CM | POA: Diagnosis not present

## 2017-03-07 DIAGNOSIS — R079 Chest pain, unspecified: Secondary | ICD-10-CM | POA: Diagnosis present

## 2017-03-07 LAB — CBC WITH DIFFERENTIAL/PLATELET
Basophils Absolute: 0 10*3/uL (ref 0.0–0.1)
Basophils Relative: 1 %
EOS PCT: 1 %
Eosinophils Absolute: 0.1 10*3/uL (ref 0.0–0.7)
HCT: 43.8 % (ref 36.0–46.0)
Hemoglobin: 14.6 g/dL (ref 12.0–15.0)
LYMPHS ABS: 1.5 10*3/uL (ref 0.7–4.0)
Lymphocytes Relative: 37 %
MCH: 29.9 pg (ref 26.0–34.0)
MCHC: 33.3 g/dL (ref 30.0–36.0)
MCV: 89.6 fL (ref 78.0–100.0)
Monocytes Absolute: 0.4 10*3/uL (ref 0.1–1.0)
Monocytes Relative: 8 %
Neutro Abs: 2.2 10*3/uL (ref 1.7–7.7)
Neutrophils Relative %: 53 %
PLATELETS: 208 10*3/uL (ref 150–400)
RBC: 4.89 MIL/uL (ref 3.87–5.11)
RDW: 12.9 % (ref 11.5–15.5)
WBC: 4.2 10*3/uL (ref 4.0–10.5)

## 2017-03-07 LAB — COMPREHENSIVE METABOLIC PANEL
ALT: 11 U/L — AB (ref 14–54)
ANION GAP: 10 (ref 5–15)
AST: 26 U/L (ref 15–41)
Albumin: 3.8 g/dL (ref 3.5–5.0)
Alkaline Phosphatase: 73 U/L (ref 38–126)
BUN: 12 mg/dL (ref 6–20)
CHLORIDE: 104 mmol/L (ref 101–111)
CO2: 24 mmol/L (ref 22–32)
CREATININE: 0.65 mg/dL (ref 0.44–1.00)
Calcium: 9.2 mg/dL (ref 8.9–10.3)
Glucose, Bld: 111 mg/dL — ABNORMAL HIGH (ref 65–99)
POTASSIUM: 3.5 mmol/L (ref 3.5–5.1)
SODIUM: 138 mmol/L (ref 135–145)
Total Bilirubin: 1.3 mg/dL — ABNORMAL HIGH (ref 0.3–1.2)
Total Protein: 7.5 g/dL (ref 6.5–8.1)

## 2017-03-07 LAB — D-DIMER, QUANTITATIVE (NOT AT ARMC): D DIMER QUANT: 0.49 ug{FEU}/mL (ref 0.00–0.50)

## 2017-03-07 LAB — TROPONIN I

## 2017-03-07 MED ORDER — SUCRALFATE 1 G PO TABS: 1.0000 g | ORAL_TABLET | Freq: Four times a day (QID) | ORAL | 0 refills | Status: DC

## 2017-03-07 MED ORDER — SODIUM CHLORIDE 0.9 % IV BOLUS (SEPSIS)
500.0000 mL | Freq: Once | INTRAVENOUS | Status: AC
  Administered 2017-03-07: 500 mL via INTRAVENOUS

## 2017-03-07 MED ORDER — FAMOTIDINE 40 MG PO TABS: 40.0000 mg | ORAL_TABLET | Freq: Every evening | ORAL | 1 refills | Status: DC

## 2017-03-07 MED ORDER — GI COCKTAIL ~~LOC~~
30.0000 mL | Freq: Once | ORAL | Status: AC
  Administered 2017-03-07: 30 mL via ORAL
  Filled 2017-03-07: qty 30

## 2017-03-07 NOTE — ED Provider Notes (Signed)
[facility] Emergency Department Provider Note    ____________________________________________   I have reviewed the triage vital signs and the nursing notes.   HISTORY  Chief Complaint Abdominal Pain   History limited by: Not Limited   HPI Lorraine Ryan is a 62 y.o. female who presents to the emergency department today because of concern for chest pain, shortness of breath. Patient was seen earlier today at [facility] cone. She states that she was passing by [place] so wanted to be reevaluated. The patient had negative blood work and CXR done at [facility]. States that she gets this pain on and off for a while, but it started again yesterday. It is located in the central chest and goes up her chest. It is associated with some nausea. She denies any fevers.     Per medical record review patient has a history of ER visit earlier today with negative work up.  Past Medical History:  Diagnosis Date  . Anxiety   . Back pain   . Bipolar disorder ([place])   . Chronic headaches   . Dementia   . Depression   . Diabetes mellitus without complication ([place])   . GERD (gastroesophageal reflux disease)   . Hypertension    No meds prescribed; states intermittent  . Neuromuscular disorder ([place])   . Shortness of breath dyspnea   . Stroke [facility])    caused numbness in leg    Patient Active Problem List   Diagnosis Date Noted  . Depression   . Anxiety   . Abdominal pain, epigastric 03/30/2015  . Encounter for screening colonoscopy 03/30/2015  . Dysphagia 03/30/2015  . History of lumbar laminectomy for spinal cord decompression 02/08/2015  . Orthostatic hypotension 04/20/2014  . Hyperlipidemia 04/20/2014  . Low back pain radiating to right leg 03/01/2013  . Numbness in right leg 03/01/2013  . Chest pain 03/01/2013  . Hypertension   . Back pain   . Stroke [facility])     Past Surgical History:  Procedure Laterality Date  . ABDOMINAL HYSTERECTOMY    . COLONOSCOPY  WITH PROPOFOL N/A 04/18/2015   WUJ:[id number] hemorrhoids/diverticulosis sigmoid colon/  . ESOPHAGOGASTRODUODENOSCOPY (EGD) WITH PROPOFOL N/A 04/18/2015   SLF:web in the proximal esophagus/dilated/  . GANGLION CYST EXCISION    . LUMBAR LAMINECTOMY/DECOMPRESSION MICRODISCECTOMY N/A 02/08/2015   Procedure: L4-5 Decompression;  Surgeon: Marybelle Killings, MD;  Location: [place];  Service: Orthopedics;  Laterality: N/A;  . POLYPECTOMY  04/18/2015   Procedure: POLYPECTOMY;  Surgeon: Danie Binder, MD;  [facility];  Service: Endoscopy;;  ascending colon polyp  . TONSILLECTOMY      Prior to Admission medications   Medication Sig Start Date End Date Taking? Authorizing Provider  acetaminophen (TYLENOL) 500 MG tablet Take 1 tablet (500 mg total) by mouth every 6 (six) hours as needed. 07/28/16   Carlisle Cater, PA-C  cyclobenzaprine (FLEXERIL) 5 MG tablet TAKE 1/2 TO 1 TABLET BY MOUTH THREE TIMES DAILY AS NEEDED FOR MUSCLE SPASMS 03/04/17   Tresa Garter, MD  DULoxetine (CYMBALTA) 30 MG capsule Take 30 mg by mouth daily.    [provider]  famotidine (PEPCID) 20 MG tablet Take 1 tablet (20 mg total) by mouth 2 (two) times daily as needed for heartburn or indigestion. 06/04/16 06/04/17  Maren Reamer, MD  gabapentin (NEURONTIN) 300 MG capsule Take 1 capsule (300 mg total) by mouth 3 (three) times daily. 08/28/16   Ladell Pier, MD  lisinopril-hydrochlorothiazide (PRINZIDE,ZESTORETIC) 20-25 MG  tablet Take 1 tablet by mouth daily. 01/29/17   Argentina Donovan, PA-C  metoprolol succinate (TOPROL-XL) 25 MG 24 hr tablet TAKE 1 & 1/2 TABLETS BY MOUTH DAILY 07/03/16   Langeland, Dawn T, MD  naproxen (NAPROSYN) 500 MG tablet Take 1 tablet (500 mg total) by mouth daily as needed for moderate pain or headache. With food 01/29/17   Freeman Caldron M, PA-C  omeprazole (PRILOSEC) 20 MG capsule TAKE ONE CAPSULE BY MOUTH TWICE A DAY BEFORE A MEAL Patient not taking: Reported on 01/29/2017 07/04/16    Carlis Stable, NP  pravastatin (PRAVACHOL) 20 MG tablet Take 1 tablet (20 mg total) by mouth daily. 06/05/16   Maren Reamer, MD  traZODone (DESYREL) 100 MG tablet Take 100 mg by mouth at bedtime.    [provider]  Vitamin D, Ergocalciferol, (DRISDOL) 50000 units CAPS capsule Take 1 capsule (50,000 Units total) by mouth every 7 (seven) days. 01/30/17   Argentina Donovan, PA-C  VOLTAREN 1 % GEL Apply 2 g topically 4 (four) times daily. 03/26/16   Maren Reamer, MD    Allergies Codeine; Oxycodone; Prednisone; and Tramadol  Family History  Problem Relation Age of Onset  . Diabetes Mother   . Hypertension Mother   . Heart disease Unknown        No family history  . Colon cancer Neg Hx     Social History Social History   Tobacco Use  . Smoking status: Former Smoker    Last attempt to quit: 03/29/1985    Years since quitting: 31.9  . Smokeless tobacco: Never Used  Substance Use Topics  . Alcohol use: No    Alcohol/week: 0.0 oz    Comment: Used to drink heavily, no ETOH in "30-some years"  . Drug use: No    Comment: History of crack, "no drugs in 30-some years"    Review of Systems Constitutional: No fever/chills Eyes: No visual changes. ENT: No sore throat. Cardiovascular: Positive for chest pain. Respiratory: Denies shortness of breath. Gastrointestinal: Positive for abdominal pain. Genitourinary: Negative for dysuria. Musculoskeletal: Negative for back pain. Skin: Negative for rash. Neurological: Negative for headaches, focal weakness or numbness.  ____________________________________________   PHYSICAL EXAM:  VITAL SIGNS: ED Triage Vitals  Enc Vitals Group     BP 03/07/17 1741 (!) 139/94     Pulse Rate 03/07/17 1741 97     Resp 03/07/17 1741 16     Temp 03/07/17 1741 97.8 F (36.6 C)     Temp Source 03/07/17 1741 Oral     SpO2 03/07/17 1741 97 %     Weight 03/07/17 1742 230 lb (104.3 kg)     Height 03/07/17 1742 5\' 6"  (1.676 m)     Head  Circumference --      Peak Flow --      Pain Score 03/07/17 1742 9   Constitutional: Alert and oriented. Well appearing and in no distress. Eyes: Conjunctivae are normal.  ENT   Head: Normocephalic and atraumatic.   Nose: No congestion/rhinnorhea.   Mouth/Throat: Mucous membranes are moist.   Neck: No stridor. Hematological/Lymphatic/Immunilogical: No cervical lymphadenopathy. Cardiovascular: Normal rate, regular rhythm.  No murmurs, rubs, or gallops. Respiratory: Normal respiratory effort without tachypnea nor retractions. Breath sounds are clear and equal bilaterally. No wheezes/rales/rhonchi. Gastrointestinal: Soft and non tender. No rebound. No guarding.  Genitourinary: Deferred Musculoskeletal: Normal range of motion in all extremities. No lower extremity edema. Neurologic:  Normal speech and language. No gross focal  neurologic deficits are appreciated.  Skin:  Skin is warm, dry and intact. No rash noted. Psychiatric: Mood and affect are normal. Speech and behavior are normal. Patient exhibits appropriate insight and judgment.  ____________________________________________    LABS (pertinent positives/negatives)  None  ____________________________________________   EKG  None  ____________________________________________    RADIOLOGY  None  ____________________________________________   PROCEDURES  Procedures  ____________________________________________   INITIAL IMPRESSION / ASSESSMENT AND PLAN / ED COURSE  Pertinent labs & imaging results that were available during my care of the patient were reviewed by me and considered in my medical decision making (see chart for details).  Patient presented to the emergency department today with abdominal pain.  She was seen earlier today at another emergency department with negative workup.  Patient was given GI cocktail and stated it did significantly help her pain.  I do wonder if she is suffering from  gastritis of esophagitis.  Will prescribe an acid as well as sucralfate.  Discussed thoughts with patient.    ____________________________________________   FINAL CLINICAL IMPRESSION(S) / ED DIAGNOSES  Final diagnoses:  Gastritis, presence of bleeding unspecified, unspecified chronicity, unspecified gastritis type     Note: This dictation was prepared with Dragon dictation. Any transcriptional errors that result from this process are unintentional     Nance Pear, MD 03/07/17 2256
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
## 2017-03-07 NOTE — ED Notes (Signed)
Spoke with Dr. Rudene Re, no further orders. Able to see labs, chest xray and EKG that were performed at [facility] today.

## 2017-03-07 NOTE — ED Triage Notes (Addendum)
Pt c/o generalized abdominal pain and exertional sob x 3 days, worsening today. Seen at [facility] earlier today and discharged from [facility]. Pt alert and oriented X4, active, cooperative, pt in NAD. RR even and unlabored, color WNL.  Labs, EKG, chest xray able to be seen from earlier visit.

## 2017-03-07 NOTE — Discharge Instructions (Signed)
Please seek medical attention for any high fevers, chest pain, shortness of breath, change in behavior, persistent vomiting, bloody stool or any other new or concerning symptoms.  

## 2017-03-07 NOTE — ED Triage Notes (Signed)
C/o SOB, dizziness-started yesterday-NAD-steady gait

## 2017-03-20 ENCOUNTER — Ambulatory Visit: Payer: Medicare HMO | Admitting: Internal Medicine

## 2017-03-24 ENCOUNTER — Other Ambulatory Visit: Payer: Self-pay | Admitting: Internal Medicine

## 2017-03-24 MED FILL — LISINOPRIL-HCTZ 20-25 MG TA: 20-25 | 30 days supply | Qty: 30 | Fill #2

## 2017-03-24 MED FILL — NAPROXEN 500 MG TABLET: 500 | 30 days supply | Qty: 30 | Fill #1

## 2017-03-24 MED FILL — GABAPENTIN 300 MG CAPSULE: 300 | 30 days supply | Qty: 90 | Fill #3

## 2017-03-26 MED FILL — PRAVASTATIN NA 20 MG TAB: 20 | 90 days supply | Qty: 90 | Fill #2

## 2017-04-01 ENCOUNTER — Other Ambulatory Visit: Payer: Self-pay | Admitting: Internal Medicine

## 2017-04-01 DIAGNOSIS — M62838 Other muscle spasm: Secondary | ICD-10-CM

## 2017-04-11 ENCOUNTER — Encounter: Payer: Self-pay | Admitting: Internal Medicine

## 2017-04-11 ENCOUNTER — Ambulatory Visit: Payer: Medicare HMO | Attending: Internal Medicine | Admitting: Internal Medicine

## 2017-04-11 ENCOUNTER — Ambulatory Visit: Payer: Medicare HMO | Attending: Internal Medicine | Admitting: Licensed Clinical Social Worker

## 2017-04-11 VITALS — BP 128/74 | HR 90 | Temp 97.8°F | Resp 16 | Ht 66.0 in | Wt 236.6 lb

## 2017-04-11 DIAGNOSIS — Z87891 Personal history of nicotine dependence: Secondary | ICD-10-CM | POA: Insufficient documentation

## 2017-04-11 DIAGNOSIS — E785 Hyperlipidemia, unspecified: Secondary | ICD-10-CM | POA: Diagnosis not present

## 2017-04-11 DIAGNOSIS — E559 Vitamin D deficiency, unspecified: Secondary | ICD-10-CM | POA: Diagnosis not present

## 2017-04-11 DIAGNOSIS — M25511 Pain in right shoulder: Secondary | ICD-10-CM | POA: Diagnosis not present

## 2017-04-11 DIAGNOSIS — M545 Low back pain, unspecified: Secondary | ICD-10-CM

## 2017-04-11 DIAGNOSIS — Z5900 Homelessness unspecified: Secondary | ICD-10-CM

## 2017-04-11 DIAGNOSIS — Z8249 Family history of ischemic heart disease and other diseases of the circulatory system: Secondary | ICD-10-CM | POA: Diagnosis not present

## 2017-04-11 DIAGNOSIS — G8929 Other chronic pain: Secondary | ICD-10-CM

## 2017-04-11 DIAGNOSIS — Z79899 Other long term (current) drug therapy: Secondary | ICD-10-CM | POA: Insufficient documentation

## 2017-04-11 DIAGNOSIS — Z9889 Other specified postprocedural states: Secondary | ICD-10-CM | POA: Diagnosis not present

## 2017-04-11 DIAGNOSIS — M62838 Other muscle spasm: Secondary | ICD-10-CM

## 2017-04-11 DIAGNOSIS — F29 Unspecified psychosis not due to a substance or known physiological condition: Secondary | ICD-10-CM | POA: Diagnosis not present

## 2017-04-11 DIAGNOSIS — Z6838 Body mass index (BMI) 38.0-38.9, adult: Secondary | ICD-10-CM | POA: Insufficient documentation

## 2017-04-11 DIAGNOSIS — Z1231 Encounter for screening mammogram for malignant neoplasm of breast: Secondary | ICD-10-CM

## 2017-04-11 DIAGNOSIS — Z1239 Encounter for other screening for malignant neoplasm of breast: Secondary | ICD-10-CM

## 2017-04-11 DIAGNOSIS — Z833 Family history of diabetes mellitus: Secondary | ICD-10-CM | POA: Insufficient documentation

## 2017-04-11 DIAGNOSIS — F329 Major depressive disorder, single episode, unspecified: Secondary | ICD-10-CM | POA: Diagnosis not present

## 2017-04-11 DIAGNOSIS — Z888 Allergy status to other drugs, medicaments and biological substances status: Secondary | ICD-10-CM | POA: Diagnosis not present

## 2017-04-11 DIAGNOSIS — E669 Obesity, unspecified: Secondary | ICD-10-CM | POA: Diagnosis not present

## 2017-04-11 DIAGNOSIS — I10 Essential (primary) hypertension: Secondary | ICD-10-CM | POA: Diagnosis present

## 2017-04-11 DIAGNOSIS — F419 Anxiety disorder, unspecified: Secondary | ICD-10-CM | POA: Diagnosis not present

## 2017-04-11 DIAGNOSIS — Z885 Allergy status to narcotic agent status: Secondary | ICD-10-CM | POA: Diagnosis not present

## 2017-04-11 DIAGNOSIS — Z90710 Acquired absence of both cervix and uterus: Secondary | ICD-10-CM | POA: Insufficient documentation

## 2017-04-11 MED ORDER — PRAVASTATIN SODIUM 20 MG PO TABS: 20.0000 mg | ORAL_TABLET | Freq: Every day | ORAL | 3 refills | Status: DC

## 2017-04-11 MED ORDER — CYCLOBENZAPRINE HCL 5 MG PO TABS: 5.0000 mg | ORAL_TABLET | Freq: Every day | ORAL | 3 refills | Status: DC | PRN

## 2017-04-11 MED ORDER — GABAPENTIN 300 MG PO CAPS: 300.0000 mg | ORAL_CAPSULE | Freq: Three times a day (TID) | ORAL | 3 refills | Status: DC

## 2017-04-11 MED FILL — CYCLOBENZAPRINE 5 MG TABLET: 5 | 30 days supply | Qty: 30 | Fill #0

## 2017-04-11 NOTE — Patient Instructions (Signed)

## 2017-04-11 NOTE — BH Specialist Note (Signed)
Integrated Behavioral Health Initial Visit  MRN: [id number] Name: Lorraine Ryan  Number of [place] Clinician visits:: 1/6 Session Start time: 2:15 PM  Session End time: 2:35 PM Total time: 20 minutes  Type of Service: [place] Interpretor:No. Interpretor Name and Language: N/A   Warm Hand Off Completed.       SUBJECTIVE: Lorraine Ryan is a 62 y.o. female accompanied by self Patient was referred by Dr. Wynetta Emery for mental health resources. Patient reports the following symptoms/concerns: chronic pain in leg, back, and neck. She states that she hears non-command voices; however, denies decline in functioning. Duration of problem: Ongoing, Pt reports diagnoses of Biopolar; Severity of problem: moderate  OBJECTIVE: Mood: Anxious and Affect: Labile Risk of harm to self or others: No plan to harm self or others  LIFE CONTEXT: Family and Social: Pt reports staying with son during nights. She reports limited support School/Work: Pt states that she receives income. Not receiving public benefits Self-Care: Pt states that she participates in medication management through [place]: Pt is homeless and has ongoing medical concerns  GOALS ADDRESSED: Patient will: 1. Reduce symptoms of: anxiety 2. Increase knowledge and/or ability of: coping skills and healthy habits  3. Demonstrate ability to: Increase adequate support systems for patient/family and Increase motivation to adhere to plan of care  INTERVENTIONS: Interventions utilized: Solution-Focused Strategies, Supportive Counseling and Psychoeducation and/or Health Education  Standardized Assessments completed: GAD-7 and PHQ 2&9  ASSESSMENT: Patient currently experiencing anxiety triggered by homelessness and chronic pain. She resides with adult son; however, reports limited support. During assessment, pt shared that she is aware of "hindering spirits. I just  want to move forward". She denies SI/HI. Endorses non-command voices that pt states does not interfere with functioning.   Patient may benefit from psychotherapy. [facility] for [facility]. She is not interested in psychotherapy at this time. LCSWA educated pt on the correlation between one's physical and mental health. Pt shared that stress may be exacerbating body aches and pain. LCSWA informed pt of benefits of exercise and stretching. Pt agreeable to implementing healthy coping strategies on a weekly basis. She visits the [facility] daily. Community resources for food insecurity were provided.  PLAN: 1. Follow up with behavioral health clinician on : Pt was encouraged to contact LCSWA if symptoms worsen or fail to improve to schedule behavioral appointments at [facility]. 2. Behavioral recommendations: LCSWA recommends that pt apply healthy coping skills discussed, comply with medication management, and utilize provided resources. Pt is encouraged to schedule follow up appointment with LCSWA 3. Referral(s): [place] (In Clinic) and [employer] Resources:  Food 4. "From scale of 1-10, how likely are you to follow plan?":   Rebekah Chesterfield, LCSW 04/11/17 5:16 PM
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
## 2017-04-13 ENCOUNTER — Emergency Department (HOSPITAL_BASED_OUTPATIENT_CLINIC_OR_DEPARTMENT_OTHER): Payer: No Typology Code available for payment source

## 2017-04-13 ENCOUNTER — Encounter (HOSPITAL_BASED_OUTPATIENT_CLINIC_OR_DEPARTMENT_OTHER): Payer: Self-pay | Admitting: Emergency Medicine

## 2017-04-13 ENCOUNTER — Emergency Department (HOSPITAL_BASED_OUTPATIENT_CLINIC_OR_DEPARTMENT_OTHER)
Admission: EM | Admit: 2017-04-13 | Discharge: 2017-04-13 | Disposition: A | Discharge status: Home / Self Care | Payer: No Typology Code available for payment source | Attending: Emergency Medicine | Admitting: Emergency Medicine

## 2017-04-13 ENCOUNTER — Other Ambulatory Visit: Payer: Self-pay

## 2017-04-13 DIAGNOSIS — Z87891 Personal history of nicotine dependence: Secondary | ICD-10-CM | POA: Insufficient documentation

## 2017-04-13 DIAGNOSIS — I10 Essential (primary) hypertension: Secondary | ICD-10-CM | POA: Diagnosis not present

## 2017-04-13 DIAGNOSIS — M545 Low back pain: Secondary | ICD-10-CM | POA: Diagnosis not present

## 2017-04-13 DIAGNOSIS — S79911A Unspecified injury of right hip, initial encounter: Secondary | ICD-10-CM | POA: Diagnosis not present

## 2017-04-13 DIAGNOSIS — Y999 Unspecified external cause status: Secondary | ICD-10-CM | POA: Insufficient documentation

## 2017-04-13 DIAGNOSIS — Y9389 Activity, other specified: Secondary | ICD-10-CM | POA: Diagnosis not present

## 2017-04-13 DIAGNOSIS — Y92410 Unspecified street and highway as the place of occurrence of the external cause: Secondary | ICD-10-CM | POA: Diagnosis not present

## 2017-04-13 DIAGNOSIS — S3992XA Unspecified injury of lower back, initial encounter: Secondary | ICD-10-CM | POA: Diagnosis present

## 2017-04-13 DIAGNOSIS — E119 Type 2 diabetes mellitus without complications: Secondary | ICD-10-CM | POA: Insufficient documentation

## 2017-04-13 DIAGNOSIS — M25551 Pain in right hip: Secondary | ICD-10-CM | POA: Diagnosis not present

## 2017-04-13 DIAGNOSIS — S39012A Strain of muscle, fascia and tendon of lower back, initial encounter: Secondary | ICD-10-CM | POA: Diagnosis not present

## 2017-04-13 DIAGNOSIS — Z79899 Other long term (current) drug therapy: Secondary | ICD-10-CM | POA: Insufficient documentation

## 2017-04-13 DIAGNOSIS — M5416 Radiculopathy, lumbar region: Secondary | ICD-10-CM | POA: Insufficient documentation

## 2017-04-13 MED ORDER — NAPROXEN 375 MG PO TABS: 375.0000 mg | ORAL_TABLET | Freq: Two times a day (BID) | ORAL | 0 refills | Status: DC

## 2017-04-13 MED ORDER — CYCLOBENZAPRINE HCL 10 MG PO TABS: 10.0000 mg | ORAL_TABLET | Freq: Two times a day (BID) | ORAL | 0 refills | Status: DC | PRN

## 2017-04-13 MED ORDER — KETOROLAC TROMETHAMINE 60 MG/2ML IM SOLN
60.0000 mg | Freq: Once | INTRAMUSCULAR | Status: AC
  Administered 2017-04-13: 60 mg via INTRAMUSCULAR
  Filled 2017-04-13: qty 2

## 2017-04-13 NOTE — Discharge Instructions (Signed)
Make sure to get the rest of your prescriptions tomorrow at [facility]

## 2017-04-13 NOTE — ED Notes (Signed)
Patient transported to X-ray 
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
## 2017-04-13 NOTE — ED Notes (Signed)
ED Provider at bedside. 

## 2017-04-21 MED FILL — NAPROXEN 500 MG TABLET: 500 | 30 days supply | Qty: 30 | Fill #2

## 2017-04-21 MED FILL — GABAPENTIN 300 MG CAPSULE: 300 | 30 days supply | Qty: 90 | Fill #0

## 2017-04-21 MED FILL — LISINOPRIL-HCTZ 20-25 MG TA: 20-25 | 30 days supply | Qty: 30 | Fill #3

## 2017-04-23 DIAGNOSIS — F251 Schizoaffective disorder, depressive type: Secondary | ICD-10-CM | POA: Diagnosis not present

## 2017-04-28 ENCOUNTER — Other Ambulatory Visit: Payer: Self-pay | Admitting: Internal Medicine

## 2017-04-28 DIAGNOSIS — Z1231 Encounter for screening mammogram for malignant neoplasm of breast: Secondary | ICD-10-CM

## 2017-04-29 ENCOUNTER — Ambulatory Visit
Admission: RE | Admit: 2017-04-29 | Discharge: 2017-04-29 | Disposition: A | Discharge status: Home / Self Care | Payer: Medicare HMO | Source: Ambulatory Visit | Attending: Internal Medicine | Admitting: Internal Medicine

## 2017-04-29 DIAGNOSIS — Z1231 Encounter for screening mammogram for malignant neoplasm of breast: Secondary | ICD-10-CM | POA: Diagnosis not present

## 2017-05-06 ENCOUNTER — Telehealth: Payer: Self-pay | Admitting: Internal Medicine

## 2017-05-06 MED ORDER — ACETAMINOPHEN 500 MG PO TABS: 500.0000 mg | ORAL_TABLET | Freq: Four times a day (QID) | ORAL | 1 refills | Status: DC | PRN

## 2017-05-06 NOTE — Telephone Encounter (Signed)
Patient called asking for something other than gabapentin. Please fu with patient. She says her back pain is not any better.

## 2017-05-21 MED FILL — GABAPENTIN 300 MG CAPSULE: 300 | 30 days supply | Qty: 90 | Fill #1

## 2017-05-21 MED FILL — NAPROXEN 500 MG TABLET: 500 | 30 days supply | Qty: 30 | Fill #3

## 2017-05-21 MED FILL — LISINOPRIL-HCTZ 20-25 MG TA: 20-25 | 30 days supply | Qty: 30 | Fill #4

## 2017-05-21 MED FILL — CYCLOBENZAPRINE 5 MG TABLET: 5 | 30 days supply | Qty: 30 | Fill #1

## 2017-06-10 ENCOUNTER — Ambulatory Visit: Payer: Medicare HMO | Attending: Internal Medicine | Admitting: Internal Medicine

## 2017-06-10 ENCOUNTER — Encounter: Payer: Self-pay | Admitting: Internal Medicine

## 2017-06-10 ENCOUNTER — Ambulatory Visit (HOSPITAL_COMMUNITY)
Admission: RE | Admit: 2017-06-10 | Discharge: 2017-06-10 | Disposition: A | Discharge status: Home / Self Care | Payer: Medicare HMO | Source: Ambulatory Visit | Attending: Internal Medicine | Admitting: Internal Medicine

## 2017-06-10 VITALS — BP 144/86 | HR 56 | Temp 97.8°F | Resp 16 | Wt 237.6 lb

## 2017-06-10 DIAGNOSIS — M24811 Other specific joint derangements of right shoulder, not elsewhere classified: Secondary | ICD-10-CM | POA: Insufficient documentation

## 2017-06-10 DIAGNOSIS — Z6838 Body mass index (BMI) 38.0-38.9, adult: Secondary | ICD-10-CM | POA: Insufficient documentation

## 2017-06-10 DIAGNOSIS — F29 Unspecified psychosis not due to a substance or known physiological condition: Secondary | ICD-10-CM | POA: Insufficient documentation

## 2017-06-10 DIAGNOSIS — Z888 Allergy status to other drugs, medicaments and biological substances status: Secondary | ICD-10-CM | POA: Insufficient documentation

## 2017-06-10 DIAGNOSIS — Z79899 Other long term (current) drug therapy: Secondary | ICD-10-CM | POA: Diagnosis not present

## 2017-06-10 DIAGNOSIS — M545 Low back pain, unspecified: Secondary | ICD-10-CM

## 2017-06-10 DIAGNOSIS — I10 Essential (primary) hypertension: Secondary | ICD-10-CM | POA: Diagnosis not present

## 2017-06-10 DIAGNOSIS — G8929 Other chronic pain: Secondary | ICD-10-CM | POA: Diagnosis present

## 2017-06-10 DIAGNOSIS — Z885 Allergy status to narcotic agent status: Secondary | ICD-10-CM | POA: Diagnosis not present

## 2017-06-10 DIAGNOSIS — E559 Vitamin D deficiency, unspecified: Secondary | ICD-10-CM | POA: Diagnosis not present

## 2017-06-10 DIAGNOSIS — E785 Hyperlipidemia, unspecified: Secondary | ICD-10-CM | POA: Diagnosis not present

## 2017-06-10 DIAGNOSIS — F419 Anxiety disorder, unspecified: Secondary | ICD-10-CM | POA: Diagnosis not present

## 2017-06-10 DIAGNOSIS — F319 Bipolar disorder, unspecified: Secondary | ICD-10-CM | POA: Diagnosis not present

## 2017-06-10 DIAGNOSIS — M25511 Pain in right shoulder: Secondary | ICD-10-CM | POA: Insufficient documentation

## 2017-06-10 DIAGNOSIS — Z87891 Personal history of nicotine dependence: Secondary | ICD-10-CM | POA: Diagnosis not present

## 2017-06-10 DIAGNOSIS — E669 Obesity, unspecified: Secondary | ICD-10-CM | POA: Insufficient documentation

## 2017-06-10 NOTE — Progress Notes (Signed)
Patient ID: Lorraine Ryan, female    DOB: Mar 15, 1955  MRN: [id number]  CC: Back Pain   Subjective: Lorraine Ryan is a 62 y.o. female who presents for chronic ds management Her concerns today include:  Patient with history of HTN, obesity, HL, LBP with history of laminectomy, Bipolar (followed at [facility]), vit D def   Continues to c/o chronic LBP. -worse with standing for more than 10 mins and with walking for more than 2 blocks. No numbness or tingling in legs. She feels legs are weak.  Also continue to c/o RT shoulder pain.  Feels it is getting worse.  Hurts if she tries to carry bags, with overhead reaching  and even combing hair.  Flexeril, Naprosyn and Gabapentin help.  Takes Naprosyn once a day  HTN:  Reports compliance with Lis/HCTZ.  She has not taken it as yet for the morning.  No CP/SOB  Bipolar:  Last seen by Beverly Sessions couple of mths ago.  She is not sure of next appt Still on Cymbalta.  She is also on another med but does not recall the name.  "I hear voices."  They tell her "you're not going to do this and you're not going to do that and don't eat this."   -She states that her son's house at nights but during the day she is not allowed to stay at the house.  She goes to [employer] over [employer] and some days she just sits in her vehicle  Patient Active Problem List   Diagnosis Date Noted  . Depression   . Anxiety   . Abdominal pain, epigastric 03/30/2015  . Encounter for screening colonoscopy 03/30/2015  . Dysphagia 03/30/2015  . History of lumbar laminectomy for spinal cord decompression 02/08/2015  . Orthostatic hypotension 04/20/2014  . Hyperlipidemia 04/20/2014  . Low back pain radiating to right leg 03/01/2013  . Numbness in right leg 03/01/2013  . Chest pain 03/01/2013  . Hypertension   . Back pain   . Stroke [facility])      Current Outpatient Medications on File Prior to Visit  Medication Sig Dispense Refill  . acetaminophen (TYLENOL) 500 MG tablet  Take 1 tablet (500 mg total) by mouth every 6 (six) hours as needed. 40 tablet 1  . cyclobenzaprine (FLEXERIL) 10 MG tablet Take 1 tablet (10 mg total) by mouth 2 (two) times daily as needed for muscle spasms. (Patient not taking: Reported on 06/10/2017) 10 tablet 0  . cyclobenzaprine (FLEXERIL) 5 MG tablet Take 1 tablet (5 mg total) by mouth daily as needed for muscle spasms. 30 tablet 3  . DULoxetine (CYMBALTA) 30 MG capsule Take 30 mg by mouth daily.    . famotidine (PEPCID) 40 MG tablet Take 1 tablet (40 mg total) by mouth every evening. 30 tablet 1  . gabapentin (NEURONTIN) 300 MG capsule Take 1 capsule (300 mg total) by mouth 3 (three) times daily. 90 capsule 3  . lisinopril-hydrochlorothiazide (PRINZIDE,ZESTORETIC) 20-25 MG tablet Take 1 tablet by mouth daily. 90 tablet 3  . naproxen (NAPROSYN) 375 MG tablet Take 1 tablet (375 mg total) by mouth 2 (two) times daily. (Patient not taking: Reported on 06/10/2017) 10 tablet 0  . naproxen (NAPROSYN) 500 MG tablet Take 1 tablet (500 mg total) by mouth daily as needed for moderate pain or headache. With food (Patient not taking: Reported on 06/10/2017) 60 tablet 2  . omeprazole (PRILOSEC) 20 MG capsule TAKE ONE CAPSULE BY MOUTH TWICE A DAY BEFORE A MEAL (  Patient not taking: Reported on 01/29/2017) 60 capsule 5  . pravastatin (PRAVACHOL) 20 MG tablet Take 1 tablet (20 mg total) by mouth daily. 90 tablet 3  . sucralfate (CARAFATE) 1 g tablet Take 1 tablet (1 g total) by mouth 4 (four) times daily. 60 tablet 0  . traZODone (DESYREL) 100 MG tablet Take 100 mg by mouth at bedtime.    . Vitamin D, Ergocalciferol, (DRISDOL) 50000 units CAPS capsule Take 1 capsule (50,000 Units total) by mouth every 7 (seven) days. 16 capsule 0  . VOLTAREN 1 % GEL Apply 2 g topically 4 (four) times daily. 100 g 2   No current facility-administered medications on file prior to visit.     Allergies  Allergen Reactions  . Codeine Other (See Comments)    Headache  . Oxycodone  Nausea Only and Palpitations  . Prednisone Other (See Comments)    Headache  . Tramadol Nausea Only    Social History   Socioeconomic History  . Marital status: Divorced    Spouse name: Not on file  . Number of children: 5  . Years of education: Not on file  . Highest education level: Not on file  Occupational History  . Not on file  Social Needs  . Financial resource strain: Not on file  . Food insecurity:    Worry: Not on file    Inability: Not on file  . Transportation needs:    Medical: Not on file    Non-medical: Not on file  Tobacco Use  . Smoking status: Former Smoker    Last attempt to quit: 03/29/1985    Years since quitting: 32.2  . Smokeless tobacco: Never Used  Substance and Sexual Activity  . Alcohol use: No    Alcohol/week: 0.0 oz    Comment: Used to drink heavily, no ETOH in "30-some years"  . Drug use: No    Comment: History of crack, "no drugs in 30-some years"  . Sexual activity: Not on file  Lifestyle  . Physical activity:    Days per week: Not on file    Minutes per session: Not on file  . Stress: Not on file  Relationships  . Social connections:    Talks on phone: Not on file    Gets together: Not on file    Attends religious service: Not on file    Active member of club or organization: Not on file    Attends meetings of clubs or organizations: Not on file    Relationship status: Not on file  . Intimate partner violence:    Fear of current or ex partner: Not on file    Emotionally abused: Not on file    Physically abused: Not on file    Forced sexual activity: Not on file  Other Topics Concern  . Not on file  Social History Narrative  . Not on file    Family History  Problem Relation Age of Onset  . Diabetes Mother   . Hypertension Mother   . Heart disease Unknown        No family history  . Breast cancer Sister        diagnosed in her 91's  . Colon cancer Neg Hx     Past Surgical History:  Procedure Laterality Date  .  ABDOMINAL HYSTERECTOMY    . COLONOSCOPY WITH PROPOFOL N/A 04/18/2015   TDV:VOHYWVPX hemorrhoids/diverticulosis sigmoid colon/  . ESOPHAGOGASTRODUODENOSCOPY (EGD) WITH PROPOFOL N/A 04/18/2015   SLF:web in the proximal esophagus/dilated/  .  GANGLION CYST EXCISION    . LUMBAR LAMINECTOMY/DECOMPRESSION MICRODISCECTOMY N/A 02/08/2015   Procedure: L4-5 Decompression;  Surgeon: Marybelle Killings, MD;  Location: [place];  Service: Orthopedics;  Laterality: N/A;  . POLYPECTOMY  04/18/2015   Procedure: POLYPECTOMY;  Surgeon: Danie Binder, MD;  [facility];  Service: Endoscopy;;  ascending colon polyp  . TONSILLECTOMY      ROS: Review of Systems Negative except as above PHYSICAL EXAM: BP (!) 144/86   Pulse (!) 56   Temp 97.8 F (36.6 C) (Oral)   Resp 16   Wt 237 lb 9.6 oz (107.8 kg)   SpO2 96%   BMI 38.35 kg/m    Physical Exam  General appearance -middle-age 3 older African-American female who appears slightly unkept Mental status -flat affect.  Answers questions appropriately Chest - clear to auscultation, no wheezes, rales or rhonchi, symmetric air entry Heart - normal rate, regular rhythm, normal S1, S2, no murmurs, rubs, clicks or gallops Extremities - peripheral pulses normal, no pedal edema, no clubbing or cyanosis MSK: Right shoulder- no point tenderness.  Fairly good range of motion.  Drop arm test negative. LS-spine- no point tenderness.  Straight leg raise produces pain in the lower back on both sides.  Power in both lower extremities 5 out of 5  X-ray lumbar spine 04/2017 IMPRESSION: Lumbar disc and facet degeneration without evidence of acute osseous Abnormality.  Lab Results  Component Value Date   WBC 4.2 03/07/2017   HGB 14.6 03/07/2017   HCT 43.8 03/07/2017   MCV 89.6 03/07/2017   PLT 208 03/07/2017     Chemistry      Component Value Date/Time   NA 138 03/07/2017 1145   NA 144 01/29/2017 1039   K 3.5 03/07/2017 1145   CL 104 03/07/2017 1145   CO2 24  03/07/2017 1145   BUN 12 03/07/2017 1145   BUN 6 (L) 01/29/2017 1039   CREATININE 0.65 03/07/2017 1145   CREATININE 0.71 02/12/2016 1628      Component Value Date/Time   CALCIUM 9.2 03/07/2017 1145   ALKPHOS 73 03/07/2017 1145   AST 26 03/07/2017 1145   ALT 11 (L) 03/07/2017 1145   BILITOT 1.3 (H) 03/07/2017 1145   BILITOT 1.3 (H) 01/29/2017 1039      ASSESSMENT AND PLAN: 1. Essential hypertension Not at goal.  Patient has not taken her medicine as yet for the morning.  She will take it after she eats.  2. Chronic right shoulder pain Continue Naprosyn as needed - DG Shoulder Right; Future - Ambulatory referral to Orthopedic Surgery  3. Chronic midline low back pain without sciatica Continue Naprosyn, Flexeril and gabapentin - Ambulatory referral to Orthopedic Surgery  4. Psychosis, unspecified psychosis type [facility]) Encourage patient to go by [facility] to get a follow-up appointment with her psychiatrist as soon as possible  Patient was given the opportunity to ask questions.  Patient verbalized understanding of the plan and was able to repeat key elements of the plan.   Orders Placed This Encounter  Procedures  . DG Shoulder Right  . Ambulatory referral to Orthopedic Surgery     Requested Prescriptions    No prescriptions requested or ordered in this encounter    Return in about 4 months (around 10/11/2017).  Karle Plumber, MD, FACP

## 2017-06-10 NOTE — Patient Instructions (Signed)
Please go to [facility] and schedule a follow up appointment with the psychiatrist.    Go to [facility] to get x-ray of right shoulder.

## 2017-06-19 MED FILL — NAPROXEN 500 MG TABLET: 500 | 30 days supply | Qty: 30 | Fill #4

## 2017-06-19 MED FILL — LISINOPRIL-HCTZ 20-25 MG TA: 20-25 | 30 days supply | Qty: 30 | Fill #5

## 2017-06-19 MED FILL — CYCLOBENZAPRINE 5 MG TABLET: 5 | 30 days supply | Qty: 30 | Fill #2

## 2017-06-19 MED FILL — GABAPENTIN 300 MG CAPSULE: 300 | 30 days supply | Qty: 90 | Fill #2

## 2017-06-20 MED FILL — PRAVASTATIN NA 20 MG TAB: 20 | 90 days supply | Qty: 90 | Fill #0

## 2017-07-02 ENCOUNTER — Ambulatory Visit (INDEPENDENT_AMBULATORY_CARE_PROVIDER_SITE_OTHER): Payer: Medicare HMO | Admitting: Orthopaedic Surgery

## 2017-07-02 ENCOUNTER — Encounter (INDEPENDENT_AMBULATORY_CARE_PROVIDER_SITE_OTHER): Payer: Self-pay | Admitting: Orthopaedic Surgery

## 2017-07-02 VITALS — BP 146/91 | HR 73 | Ht 66.0 in | Wt 240.0 lb

## 2017-07-02 DIAGNOSIS — M7541 Impingement syndrome of right shoulder: Secondary | ICD-10-CM

## 2017-07-02 DIAGNOSIS — M7542 Impingement syndrome of left shoulder: Secondary | ICD-10-CM

## 2017-07-02 MED ORDER — LIDOCAINE HCL 1 % IJ SOLN
0.5000 mL | INTRAMUSCULAR | Status: AC | PRN
  Administered 2017-07-02: .5 mL

## 2017-07-02 MED ORDER — BUPIVACAINE HCL 0.25 % IJ SOLN
4.0000 mL | INTRAMUSCULAR | Status: AC | PRN
  Administered 2017-07-02: 4 mL via INTRA_ARTICULAR

## 2017-07-02 MED ORDER — METHYLPREDNISOLONE ACETATE 40 MG/ML IJ SUSP
40.0000 mg | INTRAMUSCULAR | Status: AC | PRN
  Administered 2017-07-02: 40 mg via INTRA_ARTICULAR
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
## 2017-07-03 ENCOUNTER — Telehealth (INDEPENDENT_AMBULATORY_CARE_PROVIDER_SITE_OTHER): Payer: Self-pay | Admitting: Orthopaedic Surgery

## 2017-07-03 NOTE — Telephone Encounter (Signed)
Patient called advised she got sick on her stomach after the injection. Patient said she got really sleepy and there was  a sharp pain was running down her back. The number to contact patient is [phone]

## 2017-07-04 NOTE — Telephone Encounter (Signed)
Fyi. Patient was just calling to inform us of symptoms she had after the injection. She is feeling better and states her shoulder is starting to feel better too.

## 2017-07-18 ENCOUNTER — Encounter: Payer: Self-pay | Admitting: Internal Medicine

## 2017-07-18 ENCOUNTER — Ambulatory Visit: Payer: Medicare Other | Attending: Internal Medicine | Admitting: Internal Medicine

## 2017-07-18 VITALS — BP 130/84 | HR 59 | Temp 97.5°F | Resp 16 | Wt 235.8 lb

## 2017-07-18 DIAGNOSIS — Z86010 Personal history of colonic polyps: Secondary | ICD-10-CM | POA: Diagnosis not present

## 2017-07-18 DIAGNOSIS — Z888 Allergy status to other drugs, medicaments and biological substances status: Secondary | ICD-10-CM | POA: Insufficient documentation

## 2017-07-18 DIAGNOSIS — Z885 Allergy status to narcotic agent status: Secondary | ICD-10-CM | POA: Insufficient documentation

## 2017-07-18 DIAGNOSIS — Z79899 Other long term (current) drug therapy: Secondary | ICD-10-CM | POA: Insufficient documentation

## 2017-07-18 DIAGNOSIS — E559 Vitamin D deficiency, unspecified: Secondary | ICD-10-CM | POA: Insufficient documentation

## 2017-07-18 DIAGNOSIS — Z6838 Body mass index (BMI) 38.0-38.9, adult: Secondary | ICD-10-CM | POA: Diagnosis not present

## 2017-07-18 DIAGNOSIS — F319 Bipolar disorder, unspecified: Secondary | ICD-10-CM | POA: Insufficient documentation

## 2017-07-18 DIAGNOSIS — M25511 Pain in right shoulder: Secondary | ICD-10-CM | POA: Diagnosis not present

## 2017-07-18 DIAGNOSIS — G8929 Other chronic pain: Secondary | ICD-10-CM | POA: Diagnosis not present

## 2017-07-18 DIAGNOSIS — I10 Essential (primary) hypertension: Secondary | ICD-10-CM | POA: Insufficient documentation

## 2017-07-18 DIAGNOSIS — Z8249 Family history of ischemic heart disease and other diseases of the circulatory system: Secondary | ICD-10-CM | POA: Insufficient documentation

## 2017-07-18 DIAGNOSIS — Z90710 Acquired absence of both cervix and uterus: Secondary | ICD-10-CM | POA: Insufficient documentation

## 2017-07-18 DIAGNOSIS — F419 Anxiety disorder, unspecified: Secondary | ICD-10-CM | POA: Insufficient documentation

## 2017-07-18 DIAGNOSIS — E785 Hyperlipidemia, unspecified: Secondary | ICD-10-CM | POA: Diagnosis not present

## 2017-07-18 DIAGNOSIS — E669 Obesity, unspecified: Secondary | ICD-10-CM | POA: Diagnosis not present

## 2017-07-18 DIAGNOSIS — Z833 Family history of diabetes mellitus: Secondary | ICD-10-CM | POA: Insufficient documentation

## 2017-07-18 DIAGNOSIS — Z87891 Personal history of nicotine dependence: Secondary | ICD-10-CM | POA: Diagnosis not present

## 2017-07-18 DIAGNOSIS — Z8673 Personal history of transient ischemic attack (TIA), and cerebral infarction without residual deficits: Secondary | ICD-10-CM | POA: Insufficient documentation

## 2017-07-18 DIAGNOSIS — Z803 Family history of malignant neoplasm of breast: Secondary | ICD-10-CM | POA: Diagnosis not present

## 2017-07-18 MED ORDER — NAPROXEN 500 MG PO TABS: 500.0000 mg | ORAL_TABLET | Freq: Every day | ORAL | 2 refills | Status: DC | PRN

## 2017-07-18 NOTE — Progress Notes (Signed)
Patient ID: Kagan Hietpas, female    DOB: 22-Dec-1955  MRN: [id number]  CC: Hypertension   Subjective: Lorraine Ryan is a 62 y.o. female who presents for UC visit.  Last seen 06/2017 Her concerns today include:  Patient with history of HTN, obesity,HL,LBP with history of laminectomy, Bipolar (followed at [facility]), vit D def  RT shoulder:  Saw Dr. Lorin Mercy.  Had steroid inj which gave 80% relief for a wk.  Has f/u appt with him again next mth Request RF on Naprosyn  HTN:  Took Lis/HCTZ already for this a.m.  No HA, dizziness.   Patient Active Problem List   Diagnosis Date Noted  . Depression   . Anxiety   . Abdominal pain, epigastric 03/30/2015  . Encounter for screening colonoscopy 03/30/2015  . Dysphagia 03/30/2015  . History of lumbar laminectomy for spinal cord decompression 02/08/2015  . Orthostatic hypotension 04/20/2014  . Hyperlipidemia 04/20/2014  . Low back pain radiating to right leg 03/01/2013  . Numbness in right leg 03/01/2013  . Chest pain 03/01/2013  . Hypertension   . Back pain   . Stroke [facility])      Current Outpatient Medications on File Prior to Visit  Medication Sig Dispense Refill  . acetaminophen (TYLENOL) 500 MG tablet Take 1 tablet (500 mg total) by mouth every 6 (six) hours as needed. 40 tablet 1  . cyclobenzaprine (FLEXERIL) 10 MG tablet Take 1 tablet (10 mg total) by mouth 2 (two) times daily as needed for muscle spasms. (Patient not taking: Reported on 06/10/2017) 10 tablet 0  . cyclobenzaprine (FLEXERIL) 5 MG tablet Take 1 tablet (5 mg total) by mouth daily as needed for muscle spasms. 30 tablet 3  . DULoxetine (CYMBALTA) 30 MG capsule Take 30 mg by mouth daily.    . famotidine (PEPCID) 40 MG tablet Take 1 tablet (40 mg total) by mouth every evening. 30 tablet 1  . gabapentin (NEURONTIN) 300 MG capsule Take 1 capsule (300 mg total) by mouth 3 (three) times daily. 90 capsule 3  . lisinopril-hydrochlorothiazide (PRINZIDE,ZESTORETIC) 20-25 MG tablet  Take 1 tablet by mouth daily. 90 tablet 3  . omeprazole (PRILOSEC) 20 MG capsule TAKE ONE CAPSULE BY MOUTH TWICE A DAY BEFORE A MEAL (Patient not taking: Reported on 01/29/2017) 60 capsule 5  . pravastatin (PRAVACHOL) 20 MG tablet Take 1 tablet (20 mg total) by mouth daily. 90 tablet 3  . sucralfate (CARAFATE) 1 g tablet Take 1 tablet (1 g total) by mouth 4 (four) times daily. 60 tablet 0  . traZODone (DESYREL) 100 MG tablet Take 100 mg by mouth at bedtime.    . Vitamin D, Ergocalciferol, (DRISDOL) 50000 units CAPS capsule Take 1 capsule (50,000 Units total) by mouth every 7 (seven) days. 16 capsule 0   No current facility-administered medications on file prior to visit.     Allergies  Allergen Reactions  . Codeine Other (See Comments)    Headache  . Oxycodone Nausea Only and Palpitations  . Prednisone Other (See Comments)    Headache  . Tramadol Nausea Only    Social History   Socioeconomic History  . Marital status: Divorced    Spouse name: Not on file  . Number of children: 5  . Years of education: Not on file  . Highest education level: Not on file  Occupational History  . Not on file  Social Needs  . Financial resource strain: Not on file  . Food insecurity:    Worry: Not on  file    Inability: Not on file  . Transportation needs:    Medical: Not on file    Non-medical: Not on file  Tobacco Use  . Smoking status: Former Smoker    Last attempt to quit: 03/29/1985    Years since quitting: 32.3  . Smokeless tobacco: Never Used  Substance and Sexual Activity  . Alcohol use: No    Alcohol/week: 0.0 oz    Comment: Used to drink heavily, no ETOH in "30-some years"  . Drug use: No    Comment: History of crack, "no drugs in 30-some years"  . Sexual activity: Not on file  Lifestyle  . Physical activity:    Days per week: Not on file    Minutes per session: Not on file  . Stress: Not on file  Relationships  . Social connections:    Talks on phone: Not on file    Gets  together: Not on file    Attends religious service: Not on file    Active member of club or organization: Not on file    Attends meetings of clubs or organizations: Not on file    Relationship status: Not on file  . Intimate partner violence:    Fear of current or ex partner: Not on file    Emotionally abused: Not on file    Physically abused: Not on file    Forced sexual activity: Not on file  Other Topics Concern  . Not on file  Social History Narrative  . Not on file    Family History  Problem Relation Age of Onset  . Diabetes Mother   . Hypertension Mother   . Heart disease Unknown        No family history  . Breast cancer Sister        diagnosed in her 68's  . Colon cancer Neg Hx     Past Surgical History:  Procedure Laterality Date  . ABDOMINAL HYSTERECTOMY    . COLONOSCOPY WITH PROPOFOL N/A 04/18/2015   TDD:UKGURKYH hemorrhoids/diverticulosis sigmoid colon/  . ESOPHAGOGASTRODUODENOSCOPY (EGD) WITH PROPOFOL N/A 04/18/2015   SLF:web in the proximal esophagus/dilated/  . GANGLION CYST EXCISION    . LUMBAR LAMINECTOMY/DECOMPRESSION MICRODISCECTOMY N/A 02/08/2015   Procedure: L4-5 Decompression;  Surgeon: Marybelle Killings, MD;  Location: [place];  Service: Orthopedics;  Laterality: N/A;  . POLYPECTOMY  04/18/2015   Procedure: POLYPECTOMY;  Surgeon: Danie Binder, MD;  [facility];  Service: Endoscopy;;  ascending colon polyp  . TONSILLECTOMY      ROS: Review of Systems Neg except as stated above  PHYSICAL EXAM: BP 130/84   Pulse (!) 59   Temp (!) 97.5 F (36.4 C) (Oral)   Resp 16   Wt 235 lb 12.8 oz (107 kg)   SpO2 96%   BMI 38.06 kg/m   Physical Exam  General appearance -pt in NAD. Appears  Mental status -oriented to person, place and date Musculoskeletal - RT shoulder:  No edema.  Good ROM   ASSESSMENT AND PLAN: 1. Chronic right shoulder pain Keep appt with Dr. Lorin Mercy - naproxen (NAPROSYN) 500 MG tablet; Take 1 tablet (500 mg total) by mouth daily  as needed for moderate pain. With food  Dispense: 60 tablet; Refill: 2  2. Essential hypertension At goal   Patient was given the opportunity to ask questions.  Patient verbalized understanding of the plan and was able to repeat key elements of the plan.   No orders of the  defined types were placed in this encounter.    Requested Prescriptions   Signed Prescriptions Disp Refills  . naproxen (NAPROSYN) 500 MG tablet 60 tablet 2    Sig: Take 1 tablet (500 mg total) by mouth daily as needed for moderate pain. With food    Return in about 3 months (around 10/18/2017).  Karle Plumber, MD, FACP

## 2017-07-18 NOTE — Patient Instructions (Signed)
Continue Naprosyn as need for shoulder pain. Keep appointment with the orthopedic specialist next mth.

## 2017-07-20 ENCOUNTER — Encounter (HOSPITAL_COMMUNITY): Payer: Self-pay | Admitting: *Deleted

## 2017-07-20 ENCOUNTER — Other Ambulatory Visit: Payer: Self-pay

## 2017-07-20 ENCOUNTER — Emergency Department (HOSPITAL_COMMUNITY): Payer: Medicare Other

## 2017-07-20 ENCOUNTER — Emergency Department (HOSPITAL_COMMUNITY)
Admission: EM | Admit: 2017-07-20 | Discharge: 2017-07-20 | Disposition: A | Discharge status: Home / Self Care | Payer: Medicare Other | Attending: Emergency Medicine | Admitting: Emergency Medicine

## 2017-07-20 DIAGNOSIS — Z8673 Personal history of transient ischemic attack (TIA), and cerebral infarction without residual deficits: Secondary | ICD-10-CM | POA: Insufficient documentation

## 2017-07-20 DIAGNOSIS — R531 Weakness: Secondary | ICD-10-CM | POA: Insufficient documentation

## 2017-07-20 DIAGNOSIS — M545 Low back pain, unspecified: Secondary | ICD-10-CM

## 2017-07-20 DIAGNOSIS — S0990XA Unspecified injury of head, initial encounter: Secondary | ICD-10-CM | POA: Diagnosis present

## 2017-07-20 DIAGNOSIS — E119 Type 2 diabetes mellitus without complications: Secondary | ICD-10-CM | POA: Insufficient documentation

## 2017-07-20 DIAGNOSIS — Z23 Encounter for immunization: Secondary | ICD-10-CM | POA: Insufficient documentation

## 2017-07-20 DIAGNOSIS — F0390 Unspecified dementia without behavioral disturbance: Secondary | ICD-10-CM | POA: Insufficient documentation

## 2017-07-20 DIAGNOSIS — Y998 Other external cause status: Secondary | ICD-10-CM | POA: Insufficient documentation

## 2017-07-20 DIAGNOSIS — R0789 Other chest pain: Secondary | ICD-10-CM | POA: Diagnosis not present

## 2017-07-20 DIAGNOSIS — W01198A Fall on same level from slipping, tripping and stumbling with subsequent striking against other object, initial encounter: Secondary | ICD-10-CM | POA: Insufficient documentation

## 2017-07-20 DIAGNOSIS — Z87891 Personal history of nicotine dependence: Secondary | ICD-10-CM | POA: Diagnosis not present

## 2017-07-20 DIAGNOSIS — S0083XA Contusion of other part of head, initial encounter: Secondary | ICD-10-CM | POA: Diagnosis not present

## 2017-07-20 DIAGNOSIS — I10 Essential (primary) hypertension: Secondary | ICD-10-CM | POA: Diagnosis not present

## 2017-07-20 DIAGNOSIS — Z7902 Long term (current) use of antithrombotics/antiplatelets: Secondary | ICD-10-CM | POA: Insufficient documentation

## 2017-07-20 DIAGNOSIS — Y929 Unspecified place or not applicable: Secondary | ICD-10-CM | POA: Insufficient documentation

## 2017-07-20 DIAGNOSIS — Z79899 Other long term (current) drug therapy: Secondary | ICD-10-CM | POA: Diagnosis not present

## 2017-07-20 DIAGNOSIS — Y9301 Activity, walking, marching and hiking: Secondary | ICD-10-CM | POA: Insufficient documentation

## 2017-07-20 DIAGNOSIS — W19XXXA Unspecified fall, initial encounter: Secondary | ICD-10-CM

## 2017-07-20 DIAGNOSIS — R079 Chest pain, unspecified: Secondary | ICD-10-CM | POA: Diagnosis not present

## 2017-07-20 LAB — CBC
HCT: 42.2 % (ref 36.0–46.0)
Hemoglobin: 13.5 g/dL (ref 12.0–15.0)
MCH: 29.8 pg (ref 26.0–34.0)
MCHC: 32 g/dL (ref 30.0–36.0)
MCV: 93.2 fL (ref 78.0–100.0)
Platelets: 227 10*3/uL (ref 150–400)
RBC: 4.53 MIL/uL (ref 3.87–5.11)
RDW: 13.2 % (ref 11.5–15.5)
WBC: 5.2 10*3/uL (ref 4.0–10.5)

## 2017-07-20 LAB — BASIC METABOLIC PANEL
Anion gap: 10 (ref 5–15)
BUN: 8 mg/dL (ref 6–20)
CO2: 25 mmol/L (ref 22–32)
Calcium: 9.1 mg/dL (ref 8.9–10.3)
Chloride: 107 mmol/L (ref 101–111)
Creatinine, Ser: 0.76 mg/dL (ref 0.44–1.00)
GFR calc Af Amer: >60 mL/min (ref >60)
GFR calc non Af Amer: >60 mL/min (ref >60)
Glucose, Bld: 112 mg/dL — ABNORMAL HIGH (ref 65–99)
Potassium: 3.6 mmol/L (ref 3.5–5.1)
Sodium: 142 mmol/L (ref 135–145)

## 2017-07-20 LAB — URINALYSIS, ROUTINE W REFLEX MICROSCOPIC
BACTERIA UA: NONE SEEN
BILIRUBIN URINE: NEGATIVE
GLUCOSE, UA: NEGATIVE mg/dL
Ketones, ur: NEGATIVE mg/dL
NITRITE: NEGATIVE
PH: 6 (ref 5.0–8.0)
Protein, ur: NEGATIVE mg/dL
SPECIFIC GRAVITY, URINE: 1.016 (ref 1.005–1.030)

## 2017-07-20 LAB — CBG MONITORING, ED: GLUCOSE-CAPILLARY: 104 mg/dL — AB (ref 65–99)

## 2017-07-20 MED ORDER — TETANUS-DIPHTH-ACELL PERTUSSIS 5-2.5-18.5 LF-MCG/0.5 IM SUSP
0.5000 mL | Freq: Once | INTRAMUSCULAR | Status: AC
  Administered 2017-07-20: 0.5 mL via INTRAMUSCULAR
  Filled 2017-07-20: qty 0.5

## 2017-07-20 NOTE — ED Notes (Signed)
Patient transported to X-ray 

## 2017-07-20 NOTE — ED Provider Notes (Signed)
[facility] EMERGENCY DEPARTMENT Provider Note   CSN: [id number] Arrival date & time: 07/20/17  1353     History   Chief Complaint Chief Complaint  Patient presents with  . Fall    HPI Lorraine Ryan is a 63 y.o. female.  Patient states she fell today striking her head and ribs left side on the ground.  She denies LOC.  She is not totally sure why she fell.  She is feeling just generally weak right now.  She denies headache blurry vision difficulty breathing chest pain abdominal pain numbness  or any focal weakness.  She has not taken anything for the pain and she will not really rate the pain either.  The history is provided by the patient.  Fall  This is a new problem. The current episode started 1 to 2 hours ago. The problem occurs constantly. The problem has not changed since onset.Associated symptoms include chest pain (lateral chest wall) and headaches. Pertinent negatives include no abdominal pain and no shortness of breath. Nothing aggravates the symptoms. Nothing relieves the symptoms. She has tried nothing for the symptoms. The treatment provided no relief.    Past Medical History:  Diagnosis Date  . Anxiety   . Back pain   . Bipolar disorder ([place])   . Chronic headaches   . Dementia   . Depression   . Diabetes mellitus without complication ([place])   . GERD (gastroesophageal reflux disease)   . Hypertension    No meds prescribed; states intermittent  . Neuromuscular disorder ([place])   . Shortness of breath dyspnea   . Stroke [facility])    caused numbness in leg    Patient Active Problem List   Diagnosis Date Noted  . Depression   . Anxiety   . Abdominal pain, epigastric 03/30/2015  . Encounter for screening colonoscopy 03/30/2015  . Dysphagia 03/30/2015  . History of lumbar laminectomy for spinal cord decompression 02/08/2015  . Orthostatic hypotension 04/20/2014  . Hyperlipidemia 04/20/2014  . Low back pain radiating to right leg 03/01/2013  .  Numbness in right leg 03/01/2013  . Chest pain 03/01/2013  . Hypertension   . Back pain   . Stroke [facility])     Past Surgical History:  Procedure Laterality Date  . ABDOMINAL HYSTERECTOMY    . COLONOSCOPY WITH PROPOFOL N/A 04/18/2015   YQI:[id number] hemorrhoids/diverticulosis sigmoid colon/  . ESOPHAGOGASTRODUODENOSCOPY (EGD) WITH PROPOFOL N/A 04/18/2015   SLF:web in the proximal esophagus/dilated/  . GANGLION CYST EXCISION    . LUMBAR LAMINECTOMY/DECOMPRESSION MICRODISCECTOMY N/A 02/08/2015   Procedure: L4-5 Decompression;  Surgeon: Marybelle Killings, MD;  Location: [place];  Service: Orthopedics;  Laterality: N/A;  . POLYPECTOMY  04/18/2015   Procedure: POLYPECTOMY;  Surgeon: Danie Binder, MD;  [facility];  Service: Endoscopy;;  ascending colon polyp  . TONSILLECTOMY       OB History   None      Home Medications    Prior to Admission medications   Medication Sig Start Date End Date Taking? Authorizing Provider  acetaminophen (TYLENOL) 500 MG tablet Take 1 tablet (500 mg total) by mouth every 6 (six) hours as needed. 05/06/17   Ladell Pier, MD  cyclobenzaprine (FLEXERIL) 10 MG tablet Take 1 tablet (10 mg total) by mouth 2 (two) times daily as needed for muscle spasms. Patient not taking: Reported on 06/10/2017 04/13/17   Blanchie Dessert, MD  cyclobenzaprine (FLEXERIL) 5 MG tablet Take 1 tablet (5 mg total) by mouth  daily as needed for muscle spasms. 04/11/17   Ladell Pier, MD  DULoxetine (CYMBALTA) 30 MG capsule Take 30 mg by mouth daily.    [provider]  famotidine (PEPCID) 40 MG tablet Take 1 tablet (40 mg total) by mouth every evening. 03/07/17 03/07/18  Nance Pear, MD  gabapentin (NEURONTIN) 300 MG capsule Take 1 capsule (300 mg total) by mouth 3 (three) times daily. 04/11/17   Ladell Pier, MD  lisinopril-hydrochlorothiazide (PRINZIDE,ZESTORETIC) 20-25 MG tablet Take 1 tablet by mouth daily. 01/29/17   Argentina Donovan, PA-C  naproxen  (NAPROSYN) 500 MG tablet Take 1 tablet (500 mg total) by mouth daily as needed for moderate pain. With food 07/18/17   Ladell Pier, MD  omeprazole (PRILOSEC) 20 MG capsule TAKE ONE CAPSULE BY MOUTH TWICE A DAY BEFORE A MEAL Patient not taking: Reported on 01/29/2017 07/04/16   Carlis Stable, NP  pravastatin (PRAVACHOL) 20 MG tablet Take 1 tablet (20 mg total) by mouth daily. 04/11/17   Ladell Pier, MD  sucralfate (CARAFATE) 1 g tablet Take 1 tablet (1 g total) by mouth 4 (four) times daily. 03/07/17   Nance Pear, MD  traZODone (DESYREL) 100 MG tablet Take 100 mg by mouth at bedtime.    [provider]  Vitamin D, Ergocalciferol, (DRISDOL) 50000 units CAPS capsule Take 1 capsule (50,000 Units total) by mouth every 7 (seven) days. 01/30/17   Argentina Donovan, PA-C    Family History Family History  Problem Relation Age of Onset  . Diabetes Mother   . Hypertension Mother   . Heart disease Unknown        No family history  . Breast cancer Sister        diagnosed in her 77's  . Colon cancer Neg Hx     Social History Social History   Tobacco Use  . Smoking status: Former Smoker    Last attempt to quit: 03/29/1985    Years since quitting: 32.3  . Smokeless tobacco: Never Used  Substance Use Topics  . Alcohol use: No    Alcohol/week: 0.0 oz    Comment: Used to drink heavily, no ETOH in "30-some years"  . Drug use: No    Comment: History of crack, "no drugs in 30-some years"     Allergies   Codeine; Oxycodone; Prednisone; and Tramadol   Review of Systems Review of Systems  Constitutional: Negative for fever.  HENT: Negative for sore throat.   Eyes: Negative for visual disturbance.  Respiratory: Negative for shortness of breath.   Cardiovascular: Positive for chest pain (lateral chest wall).  Gastrointestinal: Negative for abdominal pain.  Genitourinary: Negative for dysuria.  Musculoskeletal: Positive for back pain. Negative for neck pain.  Skin:  Positive for wound. Negative for rash.  Neurological: Positive for headaches.     Physical Exam Updated Vital Signs BP 113/75 (BP Location: Right Arm)   Pulse 81   Temp 98.1 F (36.7 C) (Oral)   Resp 18   Ht 5\' 6"  (1.676 m)   Wt 106.6 kg (235 lb)   SpO2 97%   BMI 37.93 kg/m   Physical Exam  Constitutional: She appears well-developed and well-nourished.  HENT:  Head: Normocephalic and atraumatic.  Right Ear: External ear normal.  Left Ear: External ear normal.  Nose: Nose normal.  Mouth/Throat: Oropharynx is clear and moist.  She has some superficial abrasions on her forehead with a mild hematoma.  Eyes: Pupils are equal, round, and reactive  to light. Conjunctivae and EOM are normal.  Neck: Normal range of motion. Neck supple.  Cardiovascular: Normal rate, regular rhythm, normal heart sounds and intact distal pulses.  Pulmonary/Chest: Effort normal and breath sounds normal. She has no wheezes. She has no rales.  Abdominal: Soft. She exhibits no distension and no mass. There is no tenderness. There is no guarding.  Musculoskeletal: Normal range of motion. She exhibits no tenderness or deformity.  Full range of motion of all extremities with no obvious deformities.  Neurological: She is alert. She has normal strength. No sensory deficit. GCS eye subscore is 4. GCS verbal subscore is 5. GCS motor subscore is 6.  Skin: Skin is warm and dry. Capillary refill takes less than 2 seconds.  Psychiatric: She has a normal mood and affect.  Nursing note and vitals reviewed.    ED Treatments / Results  Labs (all labs ordered are listed, but only abnormal results are displayed) Labs Reviewed  BASIC METABOLIC PANEL - Abnormal; Notable for the following components:      Result Value   Glucose, Bld 112 (*)    All other components within normal limits  URINALYSIS, ROUTINE W REFLEX MICROSCOPIC - Abnormal; Notable for the following components:   APPearance HAZY (*)    Hgb urine dipstick  MODERATE (*)    Leukocytes, UA SMALL (*)    All other components within normal limits  CBG MONITORING, ED - Abnormal; Notable for the following components:   Glucose-Capillary 104 (*)    All other components within normal limits  CBC    EKG EKG Interpretation  Date/Time:  Sunday July 20 2017 14:05:42 EDT Ventricular Rate:  76 PR Interval:  146 QRS Duration: 92 QT Interval:  418 QTC Calculation: 470 R Axis:   24 Text Interpretation:  Sinus rhythm with frequent Premature ventricular complexes Cannot rule out Anterior infarct , age undetermined No significant change since last tracing Confirmed by Blanchie Dessert [phone]) on 07/20/2017 2:22:20 PM   Radiology Dg Chest 2 View  Result Date: 07/20/2017 CLINICAL DATA:  Fall, chest pain EXAM: CHEST - 2 VIEW COMPARISON:  03/07/2017 FINDINGS: The heart size and mediastinal contours are within normal limits. Both lungs are clear. The visualized skeletal structures are unremarkable. IMPRESSION: No active cardiopulmonary disease. Electronically Signed   By: Franchot Gallo M.D.   On: 07/20/2017 15:51   Dg Lumbar Spine Complete  Result Date: 07/20/2017 CLINICAL DATA:  Fall.  Back pain EXAM: LUMBAR SPINE - COMPLETE 4+ VIEW COMPARISON:  04/13/2017 FINDINGS: Negative for fracture Mild anterolisthesis L4-5 with associated disc and facet degeneration. Mild disc degeneration at L2-3 and L3-4. Negative for pars defect Atherosclerotic aorta without aneurysm IMPRESSION: Negative for fracture Electronically Signed   By: Franchot Gallo M.D.   On: 07/20/2017 15:53    Procedures Procedures (including critical care time)  Medications Ordered in ED Medications  Tdap (BOOSTRIX) injection 0.5 mL (has no administration in time range)     Initial Impression / Assessment and Plan / ED Course  I have reviewed the triage vital signs and the nursing notes.  Pertinent labs & imaging results that were available during my care of the patient were reviewed by me  and considered in my medical decision making (see chart for details).  Clinical Course as of Jul 22 1115  Sun Jul 20, 1869  5041 62 year old female not on anticoagulation here with a fall.  Patient vague on why she fell but denies LOC.  She has some abrasions on her  head and some vague tenderness in her left paralumbar area.  She had an EKG that was sinus with a few PVCs.  Is getting some screening labs and x-rays were chest and low back.  She did not want anything for pain.  We are updating her tetanus.   [MB]  8413 I reviewed the lab work and imaging with the patient.  She is comfortable going home and I told her ice Tylenol and follow-up with her doctor.  She understands to return if any problems.   [MB]    Clinical Course User Index [MB] Hayden Rasmussen, MD    Final Clinical Impressions(s) / ED Diagnoses   Final diagnoses:  Fall, initial encounter  Contusion of other part of head, initial encounter  Acute left-sided low back pain without sciatica    ED Discharge Orders    None       Hayden Rasmussen, MD 07/21/17 1120

## 2017-07-20 NOTE — Discharge Instructions (Addendum)
You were evaluated in the emergency department for a fall in the she struck your head and injured your low back.  You had some x-rays and lab work that did not show any obvious signs of injury.  You should take Tylenol for pain and use ice to the areas that were injured.  Please follow-up with your doctor and return if any concerns.

## 2017-07-20 NOTE — ED Triage Notes (Cosign Needed Addendum)
Pt states she was standing behind her truck and she fell, without tripping.  Denies dizziness, but does c/o weakness since she awoke at 4 am.  Abrasion to R forehead.  C/o pain to R forehead, L ribs, L flank.  States she fell last week as well.  Weakness noted in all extremities, though no focal weakness noted.

## 2017-08-11 MED FILL — GABAPENTIN 300 MG CAPSULE: 300 | 30 days supply | Qty: 90 | Fill #3

## 2017-08-11 MED FILL — CYCLOBENZAPRINE 5 MG TABLET: 5 | 30 days supply | Qty: 30 | Fill #3

## 2017-08-11 MED FILL — NAPROXEN 500 MG TABLET: 500 | 30 days supply | Qty: 30 | Fill #5

## 2017-08-11 MED FILL — LISINOPRIL-HCTZ 20-25 MG TA: 20-25 | 30 days supply | Qty: 30 | Fill #6

## 2017-08-26 ENCOUNTER — Telehealth: Payer: Self-pay | Admitting: Internal Medicine

## 2017-08-26 NOTE — Telephone Encounter (Signed)
Pt called to request a referral to a [place] mental facility she states she has previously discussed this with her PCP, please follow up.

## 2017-08-27 NOTE — Telephone Encounter (Signed)
Jasmine could you please follow up with pt

## 2017-08-28 ENCOUNTER — Telehealth: Payer: Self-pay | Admitting: Licensed Clinical Social Worker

## 2017-08-28 DIAGNOSIS — F32A Depression, unspecified: Secondary | ICD-10-CM

## 2017-08-28 DIAGNOSIS — F329 Major depressive disorder, single episode, unspecified: Secondary | ICD-10-CM

## 2017-08-28 NOTE — Telephone Encounter (Signed)
Call placed to patient regarding pt's request to initiate behavioral health services with [facility].   Pt stated that she attempted to schedule an appointment with them a few days ago; however, pt was directed to obtain a psych referral from PCP. She does not want to return to [facility], where she has received psychotherapy and medication management in the past.   LCSWA inquired about SI/HI/AVH, to which pt denied. She reports no immediate need stating that she wants assistance in managing her anxiety and depression. Pt is currently taking her medication as prescribed and shared that she will be eligible for a refill 09/10/17. LCSWA encouraged pt to contact her current pharmacy, [facility]. Pt verbalized understanding. No additional concerns noted.   Please follow-up with referral.

## 2017-08-28 NOTE — Telephone Encounter (Signed)
Will send message to Dr. Wynetta Emery Monday morning when she returns

## 2017-08-29 NOTE — Telephone Encounter (Signed)
Referral has been placed. 

## 2017-08-29 NOTE — Addendum Note (Signed)
Addended byCharlott Rakes on: 08/29/2017 03:17 PM   Modules accepted: Orders

## 2017-09-02 ENCOUNTER — Encounter (INDEPENDENT_AMBULATORY_CARE_PROVIDER_SITE_OTHER): Payer: Self-pay | Admitting: Orthopaedic Surgery

## 2017-09-02 ENCOUNTER — Ambulatory Visit (INDEPENDENT_AMBULATORY_CARE_PROVIDER_SITE_OTHER): Payer: Medicare HMO | Admitting: Orthopaedic Surgery

## 2017-09-02 VITALS — BP 104/74 | HR 80 | Ht 66.0 in | Wt 235.0 lb

## 2017-09-02 DIAGNOSIS — M47812 Spondylosis without myelopathy or radiculopathy, cervical region: Secondary | ICD-10-CM | POA: Diagnosis not present

## 2017-09-02 DIAGNOSIS — M7541 Impingement syndrome of right shoulder: Secondary | ICD-10-CM | POA: Diagnosis not present

## 2017-09-02 MED ORDER — METHYLPREDNISOLONE ACETATE 40 MG/ML IJ SUSP
40.0000 mg | INTRAMUSCULAR | Status: AC | PRN
  Administered 2017-09-02: 40 mg via INTRA_ARTICULAR

## 2017-09-02 MED ORDER — LIDOCAINE HCL 1 % IJ SOLN
0.5000 mL | INTRAMUSCULAR | Status: AC | PRN
  Administered 2017-09-02: .5 mL

## 2017-09-02 MED ORDER — BUPIVACAINE HCL 0.25 % IJ SOLN
4.0000 mL | INTRAMUSCULAR | Status: AC | PRN
  Administered 2017-09-02: 4 mL via INTRA_ARTICULAR

## 2017-09-02 NOTE — Progress Notes (Signed)
Office Visit Note   Patient: Lorraine Ryan           Date of Birth: [birth year]           MRN: [id number] Visit Date: 09/02/2017              Requested by: Ladell Pier, MD [address] [place], [place] [id number] PCP: Ladell Pier, MD   Assessment & Plan: Visit Diagnoses:  1. Impingement syndrome of right shoulder   2. Spondylosis without myelopathy or radiculopathy, cervical region     Plan: Second subacromial injection performed right shoulder.  Some of her symptoms may be related to cervical spondylosis.  We offered physical therapy but she states in the past when therapy was done and actually made her back worse and she is afraid to try it again for her neck.  She can return if she has increased symptoms.  Follow-Up Instructions: No follow-ups on file.   Orders:  Orders Placed This Encounter  Procedures  . Large Joint Inj: R subacromial bursa   No orders of the defined types were placed in this encounter.     Procedures: Large Joint Inj: R subacromial bursa on 09/02/2017 9:31 AM Indications: pain Details: 22 G 1.5 in needle  Arthrogram: No  Medications: 4 mL bupivacaine 0.25 %; 40 mg methylPREDNISolone acetate 40 MG/ML; 0.5 mL lidocaine 1 % Outcome: tolerated well, no immediate complications Procedure, treatment alternatives, risks and benefits explained, specific risks discussed. Consent was given by the patient. Immediately prior to procedure a time out was called to verify the correct patient, procedure, equipment, support staff and site/side marked as required. Patient was prepped and draped in the usual sterile fashion.       Clinical Data: No additional findings.   Subjective: Chief Complaint  Patient presents with  . Left Shoulder - Follow-up    HPI 62 year old female returns she had previous shoulder injection done in [place] and states that helped for several weeks and as she is had recurrence of pain in her shoulder pain with activity  range of motion and also pain in the right side of her neck that radiates down to the radial 3 fingers of her hand.  It bothers her with increased activity she has problems with dressing and bathing.  She did note really good relief for several weeks after the right subacromial injection.  Previous C-spine x-ray showed cervical spondylosis at C6-7 and some foraminal stenosis at C3-4.  No myelopathic symptoms no fever or chills.  Previous lumbar decompression 2017 with still some ongoing back pain issues.  She can stand longer but states she still cannot walk significant distance.  Review of Systems 14 point review of systems updated unchanged from 06/30/2017.  She does have problems with depression, flat affect.  She states she stays at home and sits a lot all day long.   Objective: Vital Signs: BP 104/74   Pulse 80   Ht 5\' 6"  (1.676 m)   Wt 235 lb (106.6 kg)   BMI 37.93 kg/m   Physical Exam  Constitutional: She is oriented to person, place, and time. She appears well-developed.  HENT:  Head: Normocephalic.  Right Ear: External ear normal.  Left Ear: External ear normal.  Eyes: Pupils are equal, round, and reactive to light.  Neck: No tracheal deviation present. No thyromegaly present.  Cardiovascular: Normal rate.  Pulmonary/Chest: Effort normal.  Abdominal: Soft.  Neurological: She is alert and oriented to person, place, and time.  Skin: Skin is warm and dry.  Psychiatric: She has a normal mood and affect. Her behavior is normal.    Ortho Exam patient has some discomfort with impingement negative drop arm test.  There is brachial plexus tenderness on the right positive Spurling on the right.  Biceps brachioradialis reflex are 2+ triceps is 1+ and symmetrical.  Specialty Comments:  No specialty comments available.  Imaging: No results found.   PMFS History: Patient Active Problem List   Diagnosis Date Noted  . Depression   . Anxiety   . Abdominal pain, epigastric 03/30/2015   . Encounter for screening colonoscopy 03/30/2015  . Dysphagia 03/30/2015  . History of lumbar laminectomy for spinal cord decompression 02/08/2015  . Orthostatic hypotension 04/20/2014  . Hyperlipidemia 04/20/2014  . Low back pain radiating to right leg 03/01/2013  . Numbness in right leg 03/01/2013  . Chest pain 03/01/2013  . Hypertension   . Back pain   . Stroke [facility])    Past Medical History:  Diagnosis Date  . Anxiety   . Back pain   . Bipolar disorder ([place])   . Chronic headaches   . Dementia   . Depression   . Diabetes mellitus without complication ([place])   . GERD (gastroesophageal reflux disease)   . Hypertension    No meds prescribed; states intermittent  . Neuromuscular disorder ([place])   . Shortness of breath dyspnea   . Stroke [facility])    caused numbness in leg    Family History  Problem Relation Age of Onset  . Diabetes Mother   . Hypertension Mother   . Heart disease Unknown        No family history  . Breast cancer Sister        diagnosed in her 67's  . Colon cancer Neg Hx     Past Surgical History:  Procedure Laterality Date  . ABDOMINAL HYSTERECTOMY    . COLONOSCOPY WITH PROPOFOL N/A 04/18/2015   IWL:[id number] hemorrhoids/diverticulosis sigmoid colon/  . ESOPHAGOGASTRODUODENOSCOPY (EGD) WITH PROPOFOL N/A 04/18/2015   SLF:web in the proximal esophagus/dilated/  . GANGLION CYST EXCISION    . LUMBAR LAMINECTOMY/DECOMPRESSION MICRODISCECTOMY N/A 02/08/2015   Procedure: L4-5 Decompression;  Surgeon: Marybelle Killings, MD;  Location: [facility];  Service: Orthopedics;  Laterality: N/A;  . POLYPECTOMY  04/18/2015   Procedure: POLYPECTOMY;  Surgeon: Danie Binder, MD;  [facility];  Service: Endoscopy;;  ascending colon polyp  . TONSILLECTOMY     Social History   Occupational History  . Not on file  Tobacco Use  . Smoking status: Former Smoker    Last attempt to quit: 03/29/1985    Years since quitting: 32.4  . Smokeless tobacco: Never Used  Substance and  Sexual Activity  . Alcohol use: No    Alcohol/week: 0.0 oz    Comment: Used to drink heavily, no ETOH in "30-some years"  . Drug use: No    Comment: History of crack, "no drugs in 30-some years"  . Sexual activity: Not on file

## 2017-09-08 ENCOUNTER — Other Ambulatory Visit: Payer: Self-pay | Admitting: Internal Medicine

## 2017-09-08 DIAGNOSIS — M545 Low back pain: Principal | ICD-10-CM

## 2017-09-08 DIAGNOSIS — G8929 Other chronic pain: Secondary | ICD-10-CM

## 2017-09-08 MED FILL — LISINOPRIL-HCTZ 20-25 MG TA: 20-25 | 30 days supply | Qty: 30 | Fill #7

## 2017-09-10 MED FILL — GABAPENTIN 300 MG CAPSULE: 300 | 30 days supply | Qty: 90 | Fill #0

## 2017-09-17 MED FILL — PRAVASTATIN SODIUM 20 MG TA: 20 | 90 days supply | Qty: 90 | Fill #1

## 2017-10-03 ENCOUNTER — Telehealth: Payer: Self-pay | Admitting: Internal Medicine

## 2017-10-03 NOTE — Telephone Encounter (Signed)
Patient states Dr. Elissa Hefty told her that she needed a referral for a surgery on her neck to remove fluid, please follow up.

## 2017-10-03 NOTE — Telephone Encounter (Signed)
Will forward to pcp

## 2017-10-03 NOTE — Telephone Encounter (Signed)
Returned pt call left a detailed vm informing pt of Dr. Wynetta Emery response and if she has any questions or concerns to give me a call

## 2017-10-07 ENCOUNTER — Other Ambulatory Visit: Payer: Self-pay | Admitting: Internal Medicine

## 2017-10-07 DIAGNOSIS — M545 Low back pain: Secondary | ICD-10-CM

## 2017-10-07 DIAGNOSIS — G8929 Other chronic pain: Secondary | ICD-10-CM

## 2017-10-07 DIAGNOSIS — M62838 Other muscle spasm: Secondary | ICD-10-CM

## 2017-10-07 MED FILL — NAPROXEN 500 MG TABLET: 500 | 30 days supply | Qty: 30 | Fill #0

## 2017-10-07 MED FILL — GABAPENTIN 300 MG CAPSULE: 300 | 30 days supply | Qty: 90 | Fill #1

## 2017-10-07 MED FILL — CYCLOBENZAPRINE 5 MG TABLET: 5 | 30 days supply | Qty: 30 | Fill #0

## 2017-10-07 MED FILL — LISINOPRIL-HCTZ 20-25 MG TA: 20-25 | 30 days supply | Qty: 30 | Fill #8

## 2017-10-23 ENCOUNTER — Encounter: Payer: Self-pay | Admitting: Internal Medicine

## 2017-10-23 ENCOUNTER — Other Ambulatory Visit: Payer: Self-pay

## 2017-10-23 ENCOUNTER — Ambulatory Visit: Payer: Medicare HMO | Attending: Internal Medicine | Admitting: Internal Medicine

## 2017-10-23 VITALS — BP 149/87 | HR 64 | Temp 97.8°F | Resp 16 | Wt 223.0 lb

## 2017-10-23 DIAGNOSIS — R269 Unspecified abnormalities of gait and mobility: Secondary | ICD-10-CM

## 2017-10-23 DIAGNOSIS — R079 Chest pain, unspecified: Secondary | ICD-10-CM | POA: Diagnosis not present

## 2017-10-23 DIAGNOSIS — Z87891 Personal history of nicotine dependence: Secondary | ICD-10-CM | POA: Insufficient documentation

## 2017-10-23 DIAGNOSIS — Z9181 History of falling: Secondary | ICD-10-CM

## 2017-10-23 DIAGNOSIS — E669 Obesity, unspecified: Secondary | ICD-10-CM | POA: Diagnosis not present

## 2017-10-23 DIAGNOSIS — E785 Hyperlipidemia, unspecified: Secondary | ICD-10-CM | POA: Insufficient documentation

## 2017-10-23 DIAGNOSIS — Z8249 Family history of ischemic heart disease and other diseases of the circulatory system: Secondary | ICD-10-CM | POA: Insufficient documentation

## 2017-10-23 DIAGNOSIS — E559 Vitamin D deficiency, unspecified: Secondary | ICD-10-CM | POA: Diagnosis not present

## 2017-10-23 DIAGNOSIS — I10 Essential (primary) hypertension: Secondary | ICD-10-CM | POA: Diagnosis present

## 2017-10-23 DIAGNOSIS — Z23 Encounter for immunization: Secondary | ICD-10-CM | POA: Diagnosis not present

## 2017-10-23 DIAGNOSIS — Z79899 Other long term (current) drug therapy: Secondary | ICD-10-CM | POA: Insufficient documentation

## 2017-10-23 DIAGNOSIS — Z8673 Personal history of transient ischemic attack (TIA), and cerebral infarction without residual deficits: Secondary | ICD-10-CM | POA: Diagnosis not present

## 2017-10-23 DIAGNOSIS — F319 Bipolar disorder, unspecified: Secondary | ICD-10-CM | POA: Insufficient documentation

## 2017-10-23 DIAGNOSIS — Z6835 Body mass index (BMI) 35.0-35.9, adult: Secondary | ICD-10-CM | POA: Insufficient documentation

## 2017-10-23 DIAGNOSIS — I493 Ventricular premature depolarization: Secondary | ICD-10-CM | POA: Insufficient documentation

## 2017-10-23 DIAGNOSIS — F419 Anxiety disorder, unspecified: Secondary | ICD-10-CM | POA: Diagnosis not present

## 2017-10-23 DIAGNOSIS — H538 Other visual disturbances: Secondary | ICD-10-CM

## 2017-10-23 MED ORDER — AMLODIPINE BESYLATE 5 MG PO TABS: 5.0000 mg | ORAL_TABLET | Freq: Every day | ORAL | 3 refills | Status: DC

## 2017-10-23 MED FILL — AMLODIPINE BESYLATE 5 MG TA: 5 | 90 days supply | Qty: 90 | Fill #0

## 2017-10-23 NOTE — Patient Instructions (Addendum)
You have been referred to cardiologist and ophthalmologist. Your blood pressure is not well controlled.  We have added a new medication called amlodipine 5 mg once a day.  Please limit the salt in your foods.  Influenza Virus Vaccine injection (Fluarix) What is this medicine? INFLUENZA VIRUS VACCINE (in floo EN zuh VAHY ruhs vak SEEN) helps to reduce the risk of getting influenza also known as the flu. This medicine may be used for other purposes; ask your health care provider or pharmacist if you have questions. COMMON BRAND NAME(S): Fluarix, Fluzone What should I tell my health care provider before I take this medicine? They need to know if you have any of these conditions: -bleeding disorder like hemophilia -fever or infection -Guillain-Barre syndrome or other neurological problems -immune system problems -infection with the human immunodeficiency virus (HIV) or AIDS -low blood platelet counts -multiple sclerosis -an unusual or allergic reaction to influenza virus vaccine, eggs, chicken proteins, latex, gentamicin, other medicines, foods, dyes or preservatives -pregnant or trying to get pregnant -breast-feeding How should I use this medicine? This vaccine is for injection into a muscle. It is given by a health care professional. A copy of Vaccine Information Statements will be given before each vaccination. Read this sheet carefully each time. The sheet may change frequently. Talk to your pediatrician regarding the use of this medicine in children. Special care may be needed. Overdosage: If you think you have taken too much of this medicine contact a poison control center or emergency room at once. NOTE: This medicine is only for you. Do not share this medicine with others. What if I miss a dose? This does not apply. What may interact with this medicine? -chemotherapy or radiation therapy -medicines that lower your immune system like etanercept, anakinra, infliximab, and  adalimumab -medicines that treat or prevent blood clots like warfarin -phenytoin -steroid medicines like prednisone or cortisone -theophylline -vaccines This list may not describe all possible interactions. Give your health care provider a list of all the medicines, herbs, non-prescription drugs, or dietary supplements you use. Also tell them if you smoke, drink alcohol, or use illegal drugs. Some items may interact with your medicine. What should I watch for while using this medicine? Report any side effects that do not go away within 3 days to your doctor or health care professional. Call your health care provider if any unusual symptoms occur within 6 weeks of receiving this vaccine. You may still catch the flu, but the illness is not usually as bad. You cannot get the flu from the vaccine. The vaccine will not protect against colds or other illnesses that may cause fever. The vaccine is needed every year. What side effects may I notice from receiving this medicine? Side effects that you should report to your doctor or health care professional as soon as possible: -allergic reactions like skin rash, itching or hives, swelling of the face, lips, or tongue Side effects that usually do not require medical attention (report to your doctor or health care professional if they continue or are bothersome): -fever -headache -muscle aches and pains -pain, tenderness, redness, or swelling at site where injected -weak or tired This list may not describe all possible side effects. Call your doctor for medical advice about side effects. You may report side effects to FDA at 1-800-FDA-1088. Where should I keep my medicine? This vaccine is only given in a clinic, pharmacy, doctor's office, or other health care setting and will not be stored at home. NOTE: This  sheet is a summary. It may not cover all possible information. If you have questions about this medicine, talk to your doctor, pharmacist, or health  care provider.  2018 Elsevier/Gold Standard (2007-08-19 09:30:40)

## 2017-10-23 NOTE — Progress Notes (Signed)
Patient ID: Lorraine Ryan, female    DOB: [birth year]  MRN: [id number]  CC: Hypertension   Subjective: Lorraine Ryan is a 62 y.o. female who presents for chronic ds management Her concerns today include:  Patient with history of HTN, obesity,HL,LBP with history of laminectomy, Bipolar (followed at [facility]), vit D def  HM:   Agreeable to flu vaccine  Still stays at her son's house at night. During the day "I just hang out at different places."  HTN:  Reports compliance with Lisinopril-HCTZ.  No device to check BP.  She tries to limit salt in foods.  Endorses CP/SOB/diziness and feeling like she will pass out after walking about 1 block.  CP sharp and sometimes goes to the left arm. Goes away with rest.  Had a fall in 07/2017 of this yr and was seen in ER for it.  No LOC.Feels she needs a motorized wheelchair.   Noted to have a cane with her today that is normally used by the blind.  Pt states "I am blind."   Has problems with near vision.  Last eye exam was a few yrs ago.  Wears rxn glasses  Mental Health: Reports that she is in the process of changing providers from [facility] to [facility].  Has an appointment next month.   Patient Active Problem List   Diagnosis Date Noted  . Depression   . Anxiety   . Abdominal pain, epigastric 03/30/2015  . Encounter for screening colonoscopy 03/30/2015  . Dysphagia 03/30/2015  . History of lumbar laminectomy for spinal cord decompression 02/08/2015  . Orthostatic hypotension 04/20/2014  . Hyperlipidemia 04/20/2014  . Low back pain radiating to right leg 03/01/2013  . Numbness in right leg 03/01/2013  . Chest pain 03/01/2013  . Hypertension   . Back pain   . Stroke [facility])      Current Outpatient Medications on File Prior to Visit  Medication Sig Dispense Refill  . acetaminophen (TYLENOL) 500 MG tablet Take 1 tablet (500 mg total) by mouth every 6 (six) hours as needed. 40 tablet 1  . cyclobenzaprine (FLEXERIL) 5 MG tablet TAKE 1  TABLET (5 MG TOTAL) BY MOUTH DAILY AS NEEDED FOR MUSCLE SPASMS. 30 tablet 0  . DULoxetine (CYMBALTA) 30 MG capsule Take 30 mg by mouth daily.    Marland Kitchen gabapentin (NEURONTIN) 300 MG capsule TAKE 1 CAPSULE (300 MG TOTAL) BY MOUTH 3 (THREE) TIMES DAILY. 90 capsule 3  . lisinopril-hydrochlorothiazide (PRINZIDE,ZESTORETIC) 20-25 MG tablet Take 1 tablet by mouth daily. 90 tablet 3  . naproxen (NAPROSYN) 500 MG tablet Take 1 tablet (500 mg total) by mouth daily as needed for moderate pain. With food 60 tablet 2  . pravastatin (PRAVACHOL) 20 MG tablet Take 1 tablet (20 mg total) by mouth daily. 90 tablet 3  . traZODone (DESYREL) 100 MG tablet Take 100 mg by mouth at bedtime.    . Vitamin D, Ergocalciferol, (DRISDOL) 50000 units CAPS capsule Take 1 capsule (50,000 Units total) by mouth every 7 (seven) days. 16 capsule 0   No current facility-administered medications on file prior to visit.     Allergies  Allergen Reactions  . Codeine Other (See Comments)    Headache  . Oxycodone Nausea Only and Palpitations  . Prednisone Other (See Comments)    Headache  . Tramadol Nausea Only    Social History   Socioeconomic History  . Marital status: Divorced    Spouse name: Not on file  . Number of children: 5  .  Years of education: Not on file  . Highest education level: Not on file  Occupational History  . Not on file  Social Needs  . Financial resource strain: Not on file  . Food insecurity:    Worry: Not on file    Inability: Not on file  . Transportation needs:    Medical: Not on file    Non-medical: Not on file  Tobacco Use  . Smoking status: Former Smoker    Last attempt to quit: 03/29/1985    Years since quitting: 32.5  . Smokeless tobacco: Never Used  Substance and Sexual Activity  . Alcohol use: No    Alcohol/week: 0.0 standard drinks    Comment: Used to drink heavily, no ETOH in "30-some years"  . Drug use: No    Comment: History of crack, "no drugs in 30-some years"  . Sexual  activity: Not on file  Lifestyle  . Physical activity:    Days per week: Not on file    Minutes per session: Not on file  . Stress: Not on file  Relationships  . Social connections:    Talks on phone: Not on file    Gets together: Not on file    Attends religious service: Not on file    Active member of club or organization: Not on file    Attends meetings of clubs or organizations: Not on file    Relationship status: Not on file  . Intimate partner violence:    Fear of current or ex partner: Not on file    Emotionally abused: Not on file    Physically abused: Not on file    Forced sexual activity: Not on file  Other Topics Concern  . Not on file  Social History Narrative  . Not on file    Family History  Problem Relation Age of Onset  . Diabetes Mother   . Hypertension Mother   . Heart disease Unknown        No family history  . Breast cancer Sister        diagnosed in her 51's  . Colon cancer Neg Hx     Past Surgical History:  Procedure Laterality Date  . ABDOMINAL HYSTERECTOMY    . COLONOSCOPY WITH PROPOFOL N/A 04/18/2015   EVO:[id number] hemorrhoids/diverticulosis sigmoid colon/  . ESOPHAGOGASTRODUODENOSCOPY (EGD) WITH PROPOFOL N/A 04/18/2015   SLF:web in the proximal esophagus/dilated/  . GANGLION CYST EXCISION    . LUMBAR LAMINECTOMY/DECOMPRESSION MICRODISCECTOMY N/A 02/08/2015   Procedure: L4-5 Decompression;  Surgeon: Marybelle Killings, MD;  Location: [place] [facility];  Service: Orthopedics;  Laterality: N/A;  . POLYPECTOMY  04/18/2015   Procedure: POLYPECTOMY;  Surgeon: Danie Binder, MD;  [facility];  Service: Endoscopy;;  ascending colon polyp  . TONSILLECTOMY      ROS: Review of Systems negative PHYSICAL EXAM: BP (!) 149/87   Pulse 64   Temp 97.8 F (36.6 C) (Oral)   Resp 16   Wt 223 lb (101.2 kg)   SpO2 100%   BMI 35.99 kg/m   Physical Exam  General appearance - alert, well appearing, and in no distress Mental status -flat affect with poor eye  contact. Neck - supple, no significant adenopathy Chest -breath sounds decreased bilaterally without crackles, wheezes or rhonchi Heart -regular rate and rhythm with premature beats.  2 out of 6 early systolic murmur left upper sternal border Musculoskeletal -gait is slow with decreased foot floor clearance. Extremities -trace lower extremity edema  EKG: Sinus  rhythm with rate of 60 with frequent PVCs.  ASSESSMENT AND PLAN: 1. Essential hypertension Not at goal.  Add low-dose amlodipine. - amLODipine (NORVASC) 5 MG tablet; Take 1 tablet (5 mg total) by mouth daily.  Dispense: 90 tablet; Refill: 3  2. Chest pain on exertion Given her complaint of chest pain, shortness of breath and near syncope with exertion and EKG showing frequent PVCs, will refer to cardiology for evaluation - Ambulatory referral to Cardiology - EKG 12-Lead  3. Blurred vision - Ambulatory referral to Ophthalmology  4. History of fall 5. Gait disturbance Patient declines referral to physical therapy  6. Need for immunization against influenza - Flu Vaccine QUAD 36+ mos IM  Patient was given the opportunity to ask questions.  Patient verbalized understanding of the plan and was able to repeat key elements of the plan.   Orders Placed This Encounter  Procedures  . Flu Vaccine QUAD 36+ mos IM  . Ambulatory referral to Cardiology  . Ambulatory referral to Ophthalmology  . EKG 12-Lead     Requested Prescriptions   Signed Prescriptions Disp Refills  . amLODipine (NORVASC) 5 MG tablet 90 tablet 3    Sig: Take 1 tablet (5 mg total) by mouth daily.    Return in about 2 months (around 12/23/2017).  Karle Plumber, MD, FACP

## 2017-10-30 ENCOUNTER — Ambulatory Visit (INDEPENDENT_AMBULATORY_CARE_PROVIDER_SITE_OTHER): Payer: Medicare HMO | Admitting: Internal Medicine

## 2017-10-30 ENCOUNTER — Encounter: Payer: Self-pay | Admitting: Internal Medicine

## 2017-10-30 DIAGNOSIS — R079 Chest pain, unspecified: Secondary | ICD-10-CM

## 2017-10-30 DIAGNOSIS — I10 Essential (primary) hypertension: Secondary | ICD-10-CM | POA: Diagnosis not present

## 2017-10-30 DIAGNOSIS — E785 Hyperlipidemia, unspecified: Secondary | ICD-10-CM | POA: Diagnosis not present

## 2017-10-30 DIAGNOSIS — I493 Ventricular premature depolarization: Secondary | ICD-10-CM | POA: Diagnosis not present

## 2017-10-30 DIAGNOSIS — R002 Palpitations: Secondary | ICD-10-CM

## 2017-10-30 LAB — LIPID PANEL
CHOL/HDL RATIO: 4.2 ratio (ref 0.0–4.4)
Cholesterol, Total: 280 mg/dL — ABNORMAL HIGH (ref 100–199)
HDL: 66 mg/dL (ref >39)
LDL CALC: 195 mg/dL — AB (ref 0–99)
Triglycerides: 95 mg/dL (ref 0–149)
VLDL CHOLESTEROL CAL: 19 mg/dL (ref 5–40)

## 2017-10-30 NOTE — Patient Instructions (Addendum)
Your physician recommends that you continue on your current medications as directed. Please refer to the Current Medication list given to you today.  Your physician has recommended that you wear a holter monitor. Holter monitors are medical devices that record the heart's electrical activity. Doctors most often use these monitors to diagnose arrhythmias. Arrhythmias are problems with the speed or rhythm of the heartbeat. The monitor is a small, portable device. You can wear one while you do your normal daily activities. This is usually used to diagnose what is causing palpitations/syncope (passing out).  Your physician recommends that you return for lab work today (lipids)  Follow up with your physician will depend on test results.

## 2017-10-30 NOTE — Progress Notes (Signed)
Cardiology Office Note   Date:  10/30/2017   ID:  Lorraine Ryan, DOB May 21, 1955, MRN [id number]  PCP:  Ladell Pier, MD  Cardiologist:   Dorris Carnes, MD    Pt referred by Dr Wynetta Emery for CP     History of Present Illness: Lorraine Ryan is a 62 y.o. female with a history of HTN     Has chest pain  She says fluttering actually  WIll stab   Go away then come back  NOt associated with activity  Not pleuritic   Has had for 3 years   Seen by cardiology 3 years ago  ([facility]   Does not walk due to pain in back    Pt got hot   Was outside in shade  Got up   ALmost passed out    Dizzy at other times     Blames it on meds      Current Meds  Medication Sig  . acetaminophen (TYLENOL) 500 MG tablet Take 1 tablet (500 mg total) by mouth every 6 (six) hours as needed.  Marland Kitchen amLODipine (NORVASC) 5 MG tablet Take 1 tablet (5 mg total) by mouth daily.  . cyclobenzaprine (FLEXERIL) 5 MG tablet TAKE 1 TABLET (5 MG TOTAL) BY MOUTH DAILY AS NEEDED FOR MUSCLE SPASMS.  . DULoxetine (CYMBALTA) 60 MG capsule Take 60 mg by mouth daily.  Marland Kitchen gabapentin (NEURONTIN) 300 MG capsule TAKE 1 CAPSULE (300 MG TOTAL) BY MOUTH 3 (THREE) TIMES DAILY.  Marland Kitchen lisinopril-hydrochlorothiazide (PRINZIDE,ZESTORETIC) 20-25 MG tablet Take 1 tablet by mouth daily.  . naproxen (NAPROSYN) 500 MG tablet Take 1 tablet (500 mg total) by mouth daily as needed for moderate pain. With food  . pravastatin (PRAVACHOL) 20 MG tablet Take 1 tablet (20 mg total) by mouth daily.  . traZODone (DESYREL) 100 MG tablet Take 100 mg by mouth at bedtime.  . Vitamin D, Ergocalciferol, (DRISDOL) 50000 units CAPS capsule Take 1 capsule (50,000 Units total) by mouth every 7 (seven) days.  . ziprasidone (GEODON) 60 MG capsule Take 60 mg by mouth daily.     Allergies:   Codeine; Oxycodone; Prednisone; and Tramadol   Past Medical History:  Diagnosis Date  . Anxiety   . Back pain   . Bipolar disorder ([place])   .  Chronic headaches   . Dementia   . Depression   . Diabetes mellitus without complication ([place])   . GERD (gastroesophageal reflux disease)   . Hypertension    No meds prescribed; states intermittent  . Neuromuscular disorder ([place])   . Shortness of breath dyspnea   . Stroke [facility])    caused numbness in leg    Past Surgical History:  Procedure Laterality Date  . ABDOMINAL HYSTERECTOMY    . COLONOSCOPY WITH PROPOFOL N/A 04/18/2015   GEX:[id number] hemorrhoids/diverticulosis sigmoid colon/  . ESOPHAGOGASTRODUODENOSCOPY (EGD) WITH PROPOFOL N/A 04/18/2015   SLF:web in the proximal esophagus/dilated/  . GANGLION CYST EXCISION    . LUMBAR LAMINECTOMY/DECOMPRESSION MICRODISCECTOMY N/A 02/08/2015   Procedure: L4-5 Decompression;  Surgeon: Marybelle Killings, MD;  Location: [facility];  Service: Orthopedics;  Laterality: N/A;  . POLYPECTOMY  04/18/2015   Procedure: POLYPECTOMY;  Surgeon: Danie Binder, MD;  [facility];  Service: Endoscopy;;  ascending colon polyp  . TONSILLECTOMY       Social History:  The patient  reports that she quit smoking about 32 years ago. She has never used smokeless tobacco. She reports that she  does not drink alcohol or use drugs.   Family History:  The patient's family history includes Breast cancer in her sister; Diabetes in her mother; Heart disease in her unknown relative; Hypertension in her mother.    ROS:  Please see the history of present illness. All other systems are reviewed and  Negative to the above problem except as noted.    PHYSICAL EXAM: VS:  BP 130/82   Pulse 75   Ht 5\' 6"  (1.676 m)   Wt 221 lb (100.2 kg)   BMI 35.67 kg/m   GEN: Well nourished, well developed, in no acute distress  HEENT: normal  Neck: no JVD, carotid bruits, or masses Cardiac: RRR; no murmurs, rubs, or gallops,no edema  Respiratory:  clear to auscultation bilaterally, normal work of breathing GI: soft, nontender, nondistended, + BS  No hepatomegaly  MS: no deformity  Moving all extremities   Skin: warm and dry, no rash Neuro:  Strength and sensation are intact Psych: euthymic mood, full affect   EKG:  EKG is not  ordered today.  On 9/19   SR with PVCs   Lipid Panel    Component Value Date/Time   CHOL 260 (H) 01/29/2017 1039   TRIG 87 01/29/2017 1039   HDL 74 01/29/2017 1039   CHOLHDL 3.5 01/29/2017 1039   CHOLHDL 3.8 03/07/2014 1005   VLDL 17 03/07/2014 1005   LDLCALC 169 (H) 01/29/2017 1039      Wt Readings from Last 3 Encounters:  10/30/17 221 lb (100.2 kg)  10/23/17 223 lb (101.2 kg)  09/02/17 235 lb (106.6 kg)      ASSESSMENT AND PLAN:  1  Chest pain   Pain is atypical for coronary ischemia      2   PVCs   Frequent on EKG   Will set up for 24 hour holter   Keep on same meds for now  3  Dizziness   Spells I am not convinced due to arrhythmia    Encouragaed her to stay hydrated      Will follow     4   HCM   Will set up for lipid pane   On pravastatin  5   HTN   BP is OK today  Keep meds the same    Current medicines are reviewed at length with the patient today.  The patient does not have concerns regarding medicines.  Signed, Dorris Carnes, MD  10/30/2017 9:26 AM    [address], [place], [place]  [address] Phone: [phone]; Fax: [phone]

## 2017-11-04 ENCOUNTER — Telehealth: Payer: Self-pay | Admitting: *Deleted

## 2017-11-04 ENCOUNTER — Other Ambulatory Visit: Payer: Self-pay | Admitting: Internal Medicine

## 2017-11-04 DIAGNOSIS — M62838 Other muscle spasm: Secondary | ICD-10-CM

## 2017-11-04 DIAGNOSIS — G8929 Other chronic pain: Secondary | ICD-10-CM

## 2017-11-04 DIAGNOSIS — Z79899 Other long term (current) drug therapy: Secondary | ICD-10-CM

## 2017-11-04 DIAGNOSIS — M545 Low back pain: Secondary | ICD-10-CM

## 2017-11-04 MED ORDER — ROSUVASTATIN CALCIUM 20 MG PO TABS: 20.0000 mg | ORAL_TABLET | Freq: Every day | ORAL | 3 refills | Status: DC

## 2017-11-04 MED ORDER — ROSUVASTATIN CALCIUM 40 MG PO TABS: 40.0000 mg | ORAL_TABLET | Freq: Every day | ORAL | 3 refills | Status: DC

## 2017-11-04 MED FILL — NAPROXEN 500 MG TABLET: 500 | 30 days supply | Qty: 30 | Fill #1

## 2017-11-04 MED FILL — LISINOPRIL-HCTZ 20-25 MG TA: 20-25 | 30 days supply | Qty: 30 | Fill #9

## 2017-11-04 MED FILL — GABAPENTIN 300 MG CAPSULE: 300 | 30 days supply | Qty: 90 | Fill #2

## 2017-11-04 MED FILL — CYCLOBENZAPRINE 5 MG TABLET: 5 | 30 days supply | Qty: 30 | Fill #0

## 2017-11-04 MED FILL — ROSUVASTATIN CALCIUM 40 MG: 40 | 90 days supply | Qty: 90 | Fill #0

## 2017-11-04 NOTE — Telephone Encounter (Signed)
-----   Message from Dorris Carnes V, MD sent at 11/03/2017  1:16 AM EDT ----- LDL is very very high   195   Needs to be low    Would stop pravastatin  Start Crestor 40    F?U lipid panel and AST in 8 wk Dietary referral

## 2017-11-04 NOTE — Addendum Note (Signed)
Addended by: Gaetano Net on: 11/04/2017 01:51 PM   Modules accepted: Orders

## 2017-11-11 ENCOUNTER — Ambulatory Visit (INDEPENDENT_AMBULATORY_CARE_PROVIDER_SITE_OTHER): Payer: Medicare HMO

## 2017-11-11 DIAGNOSIS — R002 Palpitations: Secondary | ICD-10-CM

## 2017-11-28 ENCOUNTER — Other Ambulatory Visit: Payer: Self-pay

## 2017-11-28 MED ORDER — METOPROLOL SUCCINATE ER 25 MG PO TB24: 25.0000 mg | ORAL_TABLET | Freq: Every day | ORAL | 3 refills | Status: DC

## 2017-11-28 MED FILL — METOPROLOL SUCCINATE ER 25: 25 | 90 days supply | Qty: 90 | Fill #0

## 2017-12-02 MED FILL — NAPROXEN 500 MG TABLET: 500 | 90 days supply | Qty: 90 | Fill #2

## 2017-12-02 MED FILL — LISINOPRIL-HCTZ 20-25 MG TA: 20-25 | 30 days supply | Qty: 30 | Fill #10

## 2017-12-02 MED FILL — CYCLOBENZAPRINE 5 MG TABLET: 5 | 30 days supply | Qty: 30 | Fill #1

## 2017-12-02 MED FILL — GABAPENTIN 300 MG CAPSULE: 300 | 30 days supply | Qty: 90 | Fill #3

## 2017-12-03 ENCOUNTER — Ambulatory Visit (HOSPITAL_COMMUNITY): Payer: Self-pay | Admitting: Psychiatry

## 2017-12-10 DIAGNOSIS — R69 Illness, unspecified: Secondary | ICD-10-CM | POA: Diagnosis not present

## 2017-12-16 ENCOUNTER — Telehealth: Payer: Self-pay | Admitting: Internal Medicine

## 2017-12-16 NOTE — Telephone Encounter (Signed)
Patient came and dropped off handicap placard. Please call patient once complete

## 2017-12-17 NOTE — Telephone Encounter (Signed)
Contacted pt and left a detailed vm informing pt that handicapp application is ready for pickup at the front desk

## 2017-12-25 ENCOUNTER — Ambulatory Visit: Payer: Medicare HMO | Admitting: Internal Medicine

## 2017-12-27 ENCOUNTER — Other Ambulatory Visit: Payer: Self-pay

## 2017-12-27 ENCOUNTER — Ambulatory Visit (INDEPENDENT_AMBULATORY_CARE_PROVIDER_SITE_OTHER): Payer: Medicare HMO | Admitting: Psychiatry

## 2017-12-27 ENCOUNTER — Encounter (HOSPITAL_COMMUNITY): Payer: Self-pay | Admitting: Psychiatry

## 2017-12-27 VITALS — BP 142/86 | HR 98 | Temp 98.2°F | Wt 224.4 lb

## 2017-12-27 DIAGNOSIS — F319 Bipolar disorder, unspecified: Secondary | ICD-10-CM | POA: Diagnosis not present

## 2017-12-27 MED ORDER — DULOXETINE HCL 60 MG PO CPEP: 60.0000 mg | ORAL_CAPSULE | Freq: Every day | ORAL | 2 refills | Status: DC

## 2017-12-27 MED ORDER — ZIPRASIDONE HCL 60 MG PO CAPS: 60.0000 mg | ORAL_CAPSULE | Freq: Every day | ORAL | 2 refills | Status: DC

## 2017-12-27 MED ORDER — TRAZODONE HCL 100 MG PO TABS: 100.0000 mg | ORAL_TABLET | Freq: Every day | ORAL | 2 refills | Status: DC

## 2017-12-27 NOTE — Progress Notes (Signed)
Psychiatric Initial Adult Assessment   Patient Identification: Lorraine Ryan MRN:  [id number] Date of Evaluation:  12/27/2017 Referral Source: Karle Plumber, MD Chief Complaint:   Chief Complaint    Establish Care; Medication Refill; Depression; Anxiety     Visit Diagnosis:    ICD-10-CM   1. Bipolar I disorder ([place]) F31.9     History of Present Illness: This patient is a 62 year old divorced black female who claims that she is homeless.  She states that she stays with her son at night but mostly is in her car during the day or visiting other family members.  Her son resides in [place] but her home address is listed as what set [place].  She is on disability for back injury.  She used to work as a Quarry manager.  The patient was referred by her primary physician, Karle Plumber, MD, for further treatment and assessment of possible bipolar disorder.  The patient is a very poor historian.  She spent her entire time rifling through her purse looking for a card yet she has a white cane and claims that she is blind despite wearing bifocals and claiming that she drove herself here.  She states that she has been depressed since age 56 when her mother died in a car accident.  She never went to treatment even though it was offered.  She is never been in a psychiatric facility or hospital.  The patient states that she started going to [facility] in 2014.  She states this was because she was depressed because she did not have anywhere to stay.  She did have low income housing at one point but claims that she saw snakes on the porch and she did not want to live around snakes and she left.  Now she claims she cannot afford any sort of low income housing.  She states that sometimes she still sees snakes it is unclear whether or not these are real.  She also states that she hears voices "telling me different things."  She was very nonspecific but claims that she does not have command hallucinations telling her  to harm herself or others.  She states that she use drugs like crack cocaine and alcohol in the 80s but does not use them anymore and does not drink anymore.  She does not smoke.  She is not sexually active.  She states that her mood is "depressed" but this is primarily because she does not have stable housing.  She denies manic symptoms at this point.  The patient states that she no longer wants to go to [place].  She does think the Geodon has helped with the voices, trazodone helps her sleep and Cymbalta has helped her mood and she wants to continue these medications.  Apparently she asked someone at [facility] for a "nerve pill" and this was declined.  She is on gabapentin for neuropathy and perhaps this also helps anxiety.  Associated Signs/Symptoms: Depression Symptoms:  depressed mood, anhedonia, psychomotor retardation, fatigue, hopelessness, anxiety, (Hypo) Manic Symptoms:  Hallucinations, Irritable Mood, Anxiety Symptoms:  Excessive Worry, Psychotic Symptoms:  Hallucinations: Auditory Visual PTSD Symptoms:  Past Psychiatric History: Past outpatient treatment at [facility].  Patient denies prior hospitalizations  Previous Psychotropic Medications: Yes That she has had trials of Abilify and Latuda  Substance Abuse History in the last 12 months:  No.  Consequences of Substance Abuse: Negative  Past Medical History:  Past Medical History:  Diagnosis Date  . Anxiety   . Back pain   .  Bipolar disorder ([place])   . Chronic headaches   . Dementia ([place])   . Depression   . Diabetes mellitus without complication ([place])   . GERD (gastroesophageal reflux disease)   . Hypertension    No meds prescribed; states intermittent  . Neuromuscular disorder ([place])   . Shortness of breath dyspnea   . Stroke [facility])    caused numbness in leg    Past Surgical History:  Procedure Laterality Date  . ABDOMINAL HYSTERECTOMY    . COLONOSCOPY WITH PROPOFOL N/A 04/18/2015   [id number]  hemorrhoids/diverticulosis sigmoid colon/  . ESOPHAGOGASTRODUODENOSCOPY (EGD) WITH PROPOFOL N/A 04/18/2015   SLF:web in the proximal esophagus/dilated/  . GANGLION CYST EXCISION    . LUMBAR LAMINECTOMY/DECOMPRESSION MICRODISCECTOMY N/A 02/08/2015   Procedure: L4-5 Decompression;  Surgeon: Marybelle Killings, MD;  Location: [facility];  Service: Orthopedics;  Laterality: N/A;  . POLYPECTOMY  04/18/2015   Procedure: POLYPECTOMY;  Surgeon: Danie Binder, MD;  [facility];  Service: Endoscopy;;  ascending colon polyp  . TONSILLECTOMY      Family Psychiatric History: Patient's sister and 2 brothers also have depression and anxiety  Family History:  Family History  Problem Relation Age of Onset  . Diabetes Mother   . Hypertension Mother   . Heart disease Unknown        No family history  . Breast cancer Sister        diagnosed in her 34's  . Anxiety disorder Sister   . Depression Sister   . Anxiety disorder Brother   . Depression Brother   . Anxiety disorder Brother   . Depression Brother   . Colon cancer Neg Hx     Social History:   Social History   Socioeconomic History  . Marital status: Divorced    Spouse name: Not on file  . Number of children: 5  . Years of education: Not on file  . Highest education level: Associate degree: occupational, Hotel manager, or vocational program  Occupational History  . Not on file  Social Needs  . Financial resource strain: Very hard  . Food insecurity:    Worry: Often true    Inability: Often true  . Transportation needs:    Medical: No    Non-medical: No  Tobacco Use  . Smoking status: Former Smoker    Types: Cigarettes    Last attempt to quit: 03/29/1985    Years since quitting: 32.7  . Smokeless tobacco: Never Used  Substance and Sexual Activity  . Alcohol use: No    Alcohol/week: 0.0 standard drinks    Comment: Used to drink heavily, no ETOH in "30-some years"  . Drug use: No    Comment: History of crack, "no drugs in 30-some  years"  . Sexual activity: Not Currently  Lifestyle  . Physical activity:    Days per week: 1 day    Minutes per session: 60 min  . Stress: Very much  Relationships  . Social connections:    Talks on phone: Not on file    Gets together: Not on file    Attends religious service: Never    Active member of club or organization: Yes    Attends meetings of clubs or organizations: More than 4 times per year    Relationship status: Divorced  Other Topics Concern  . Not on file  Social History Narrative  . Not on file    Additional Social History: Patient claims to be homeless and stays with her son  only at night.  It is unclear whether she is actually applied to get any further housing.  Allergies:   Allergies  Allergen Reactions  . Codeine Other (See Comments)    Headache  . Oxycodone Nausea Only and Palpitations  . Prednisone Other (See Comments)    Headache  . Tramadol Nausea Only    Metabolic Disorder Labs: Lab Results  Component Value Date   HGBA1C 5.1 03/19/2016   No results found for: PROLACTIN Lab Results  Component Value Date   CHOL 280 (H) 10/30/2017   TRIG 95 10/30/2017   HDL 66 10/30/2017   CHOLHDL 4.2 10/30/2017   VLDL 17 03/07/2014   LDLCALC 195 (H) 10/30/2017   LDLCALC 169 (H) 01/29/2017   Lab Results  Component Value Date   TSH 1.061 03/07/2014    Therapeutic Level Labs: No results found for: LITHIUM No results found for: CBMZ No results found for: VALPROATE  Current Medications: Current Outpatient Medications  Medication Sig Dispense Refill  . acetaminophen (TYLENOL) 500 MG tablet Take 1 tablet (500 mg total) by mouth every 6 (six) hours as needed. 40 tablet 1  . amLODipine (NORVASC) 5 MG tablet Take 1 tablet (5 mg total) by mouth daily. 90 tablet 3  . cyclobenzaprine (FLEXERIL) 5 MG tablet TAKE 1 TABLET (5 MG TOTAL) BY MOUTH DAILY AS NEEDED FOR MUSCLE SPASMS. 30 tablet 1  . DULoxetine (CYMBALTA) 60 MG capsule Take 1 capsule (60 mg total) by  mouth daily. 30 capsule 2  . gabapentin (NEURONTIN) 300 MG capsule TAKE 1 CAPSULE (300 MG TOTAL) BY MOUTH 3 (THREE) TIMES DAILY. 90 capsule 3  . lisinopril-hydrochlorothiazide (PRINZIDE,ZESTORETIC) 20-25 MG tablet Take 1 tablet by mouth daily. 90 tablet 3  . metoprolol succinate (TOPROL XL) 25 MG 24 hr tablet Take 1 tablet (25 mg total) by mouth daily. 90 tablet 3  . naproxen (NAPROSYN) 500 MG tablet Take 1 tablet (500 mg total) by mouth daily as needed for moderate pain. With food 60 tablet 2  . rosuvastatin (CRESTOR) 40 MG tablet Take 1 tablet (40 mg total) by mouth daily. 90 tablet 3  . traZODone (DESYREL) 100 MG tablet Take 1 tablet (100 mg total) by mouth at bedtime. 30 tablet 2  . Vitamin D, Ergocalciferol, (DRISDOL) 50000 units CAPS capsule Take 1 capsule (50,000 Units total) by mouth every 7 (seven) days. 16 capsule 0  . ziprasidone (GEODON) 60 MG capsule Take 1 capsule (60 mg total) by mouth daily. Take with food 30 capsule 2   No current facility-administered medications for this visit.     Musculoskeletal: Strength & Muscle Tone: decreased Gait & Station: normal Patient leans: N/A  Psychiatric Specialty Exam: Review of Systems  Musculoskeletal: Positive for back pain and falls.  Psychiatric/Behavioral: Positive for depression and hallucinations. The patient is nervous/anxious.   All other systems reviewed and are negative.   Blood pressure (!) 142/86, pulse 98, temperature 98.2 F (36.8 C), temperature source Oral, weight 224 lb 6.4 oz (101.8 kg).Body mass index is 36.22 kg/m.  General Appearance: Casual and Disheveled  Eye Contact:  Poor  Speech:  Garbled  Volume:  Decreased  Mood:  Dysphoric and Irritable  Affect:  Blunt and Constricted  Thought Process:  Goal Directed and Descriptions of Associations: Tangential  Orientation:  Full (Time, Place, and Person)  Thought Content:  Hallucinations: Auditory Visual and Rumination  Suicidal Thoughts:  No  Homicidal  Thoughts:  No  Memory:  Immediate;   Fair Recent;   Poor  Remote;   Poor  Judgement:  Poor  Insight:  Lacking  Psychomotor Activity:  Decreased and Shuffling Gait  Concentration:  Concentration: Poor and Attention Span: Poor  Recall:  Poor  Fund of Knowledge:Poor  Language: Fair  Akathisia:  No  Handed:  Right  AIMS (if indicated):  not done  Assets:  Resilience Social Support  ADL's:  Intact  Cognition: WNL  Sleep:  Good   Screenings: GAD-7     Office Visit from 02/29/2016 in [place] [facility] Office Visit from 02/12/2016 in [place] Office Visit from 01/12/2016 in [place] [facility] Office Visit from 11/07/2015 in [facility]  Total GAD-7 Score  15  13  21  18     PHQ2-9     Office Visit from 06/10/2017 in [facility] Office Visit from 02/29/2016 in [facility] Office Visit from 02/12/2016 in [facility] Office Visit from 01/12/2016 in [facility] Office Visit from 11/07/2015 in [facility]  PHQ-2 Total Score  6  5  4  3  6   PHQ-9 Total Score  12  12  15  14  14       Assessment and Plan: This patient is a 62 year old female with a history of depression anxiety and poorly articulated hallucinations.  I am not sure where she falls diagnostically.  There does seem to be a strong element of secondary gain and she is using a cane for the blind even though she is obviously able to read and see and she drove herself here.  She does feel that the current medications are helpful so for now she will continue Geodon 60 mg daily for hallucinations, trazodone 100 mg at bedtime for sleep and Cymbalta 60 mg daily for depression.  She will return to see a psychiatrist in this clinic in 2 months   Levonne Spiller, MD 11/23/20191:51 PM

## 2017-12-29 ENCOUNTER — Encounter: Payer: Self-pay | Admitting: Internal Medicine

## 2017-12-29 ENCOUNTER — Ambulatory Visit: Payer: Medicare HMO | Attending: Internal Medicine | Admitting: Internal Medicine

## 2017-12-29 VITALS — BP 153/94 | HR 82 | Temp 97.9°F | Resp 16 | Wt 225.0 lb

## 2017-12-29 DIAGNOSIS — I493 Ventricular premature depolarization: Secondary | ICD-10-CM | POA: Insufficient documentation

## 2017-12-29 DIAGNOSIS — H538 Other visual disturbances: Secondary | ICD-10-CM

## 2017-12-29 DIAGNOSIS — M545 Low back pain, unspecified: Secondary | ICD-10-CM

## 2017-12-29 DIAGNOSIS — G8929 Other chronic pain: Secondary | ICD-10-CM | POA: Diagnosis not present

## 2017-12-29 DIAGNOSIS — M62838 Other muscle spasm: Secondary | ICD-10-CM | POA: Diagnosis not present

## 2017-12-29 DIAGNOSIS — I10 Essential (primary) hypertension: Secondary | ICD-10-CM

## 2017-12-29 DIAGNOSIS — E785 Hyperlipidemia, unspecified: Secondary | ICD-10-CM

## 2017-12-29 MED ORDER — GABAPENTIN 300 MG PO CAPS: 300.0000 mg | ORAL_CAPSULE | Freq: Three times a day (TID) | ORAL | 3 refills | Status: DC

## 2017-12-29 MED ORDER — LISINOPRIL-HYDROCHLOROTHIAZIDE 20-25 MG PO TABS: 1.0000 | ORAL_TABLET | Freq: Every day | ORAL | 3 refills | Status: DC

## 2017-12-29 MED ORDER — AMLODIPINE BESYLATE 5 MG PO TABS: 5.0000 mg | ORAL_TABLET | Freq: Every day | ORAL | 3 refills | Status: DC

## 2017-12-29 MED ORDER — CYCLOBENZAPRINE HCL 5 MG PO TABS: 5.0000 mg | ORAL_TABLET | Freq: Every day | ORAL | 1 refills | Status: DC | PRN

## 2017-12-29 MED FILL — traZODone HCL 100 MG TABS: 100 | 30 days supply | Qty: 30 | Fill #0

## 2017-12-29 MED FILL — ZIPRASIDONE HCL 60 MG CAP: 60 | 30 days supply | Qty: 30 | Fill #0

## 2017-12-29 MED FILL — DULoxetine HCL 60 MG CPEP: 60 | 30 days supply | Qty: 30 | Fill #0

## 2017-12-29 NOTE — Progress Notes (Signed)
Patient ID: Lorraine Ryan, female    DOB: 1955/02/20  MRN: [id number]  CC: Hypertension   Subjective: Lorraine Ryan is a 63 y.o. female who presents for chronic ds management Her concerns today include:  Patient with history of HTN, obesity,HL,LBP with history of laminectomy, Bipolar (followed at [facility]), vit D def  Vision: Patient is here today still using a cane for the blind.  She is wearing her glasses.  She was referred to ophthalmology on last visit.  Missed appt with Dr. Gershon Crane and did not call to reschedule.  She would like for [place] to resubmit the referral again  HTN:  Reports compliance with Lisinopril/HCTZ and Norvasc Saw cardiologist for chest pains.  Deemed to be atypical.  Started on metoprolol for frequent PVCs.  Patient thinks she was told to stop the amlodipine but on review of cardiology note and lab note, it was the Pravachol that was discontinued and patient placed on Crestor instead. She tries to limit salt in the foods. Denies any further chest pains or shortness of breath  Saw psychiatry through [facility] last wk and had refills on her psychiatric medications.  She is requesting refill on several of her medications including Flexeril. Patient Active Problem List   Diagnosis Date Noted  . History of fall 10/23/2017  . Depression   . Anxiety   . Abdominal pain, epigastric 03/30/2015  . Dysphagia 03/30/2015  . History of lumbar laminectomy for spinal cord decompression 02/08/2015  . Hyperlipidemia 04/20/2014  . Low back pain radiating to right leg 03/01/2013  . Numbness in right leg 03/01/2013  . Chest pain on exertion 03/01/2013  . Hypertension   . Stroke [facility])      Current Outpatient Medications on File Prior to Visit  Medication Sig Dispense Refill  . acetaminophen (TYLENOL) 500 MG tablet Take 1 tablet (500 mg total) by mouth every 6 (six) hours as needed. 40 tablet 1  . amLODipine (NORVASC) 5 MG tablet Take 1 tablet (5 mg total) by mouth  daily. 90 tablet 3  . cyclobenzaprine (FLEXERIL) 5 MG tablet TAKE 1 TABLET (5 MG TOTAL) BY MOUTH DAILY AS NEEDED FOR MUSCLE SPASMS. 30 tablet 1  . DULoxetine (CYMBALTA) 60 MG capsule Take 1 capsule (60 mg total) by mouth daily. 30 capsule 2  . gabapentin (NEURONTIN) 300 MG capsule TAKE 1 CAPSULE (300 MG TOTAL) BY MOUTH 3 (THREE) TIMES DAILY. 90 capsule 3  . lisinopril-hydrochlorothiazide (PRINZIDE,ZESTORETIC) 20-25 MG tablet Take 1 tablet by mouth daily. 90 tablet 3  . metoprolol succinate (TOPROL XL) 25 MG 24 hr tablet Take 1 tablet (25 mg total) by mouth daily. 90 tablet 3  . naproxen (NAPROSYN) 500 MG tablet Take 1 tablet (500 mg total) by mouth daily as needed for moderate pain. With food 60 tablet 2  . rosuvastatin (CRESTOR) 40 MG tablet Take 1 tablet (40 mg total) by mouth daily. 90 tablet 3  . traZODone (DESYREL) 100 MG tablet Take 1 tablet (100 mg total) by mouth at bedtime. 30 tablet 2  . Vitamin D, Ergocalciferol, (DRISDOL) 50000 units CAPS capsule Take 1 capsule (50,000 Units total) by mouth every 7 (seven) days. 16 capsule 0  . ziprasidone (GEODON) 60 MG capsule Take 1 capsule (60 mg total) by mouth daily. Take with food 30 capsule 2   No current facility-administered medications on file prior to visit.     Allergies  Allergen Reactions  . Codeine Other (See Comments)    Headache  . Oxycodone Nausea  Only and Palpitations  . Prednisone Other (See Comments)    Headache  . Tramadol Nausea Only    Social History   Socioeconomic History  . Marital status: Divorced    Spouse name: Not on file  . Number of children: 5  . Years of education: Not on file  . Highest education level: Associate degree: occupational, Hotel manager, or vocational program  Occupational History  . Not on file  Social Needs  . Financial resource strain: Very hard  . Food insecurity:    Worry: Often true    Inability: Often true  . Transportation needs:    Medical: No    Non-medical: No  Tobacco Use    . Smoking status: Former Smoker    Types: Cigarettes    Last attempt to quit: 03/29/1985    Years since quitting: 32.7  . Smokeless tobacco: Never Used  Substance and Sexual Activity  . Alcohol use: No    Alcohol/week: 0.0 standard drinks    Comment: Used to drink heavily, no ETOH in "30-some years"  . Drug use: No    Comment: History of crack, "no drugs in 30-some years"  . Sexual activity: Not Currently  Lifestyle  . Physical activity:    Days per week: 1 day    Minutes per session: 60 min  . Stress: Very much  Relationships  . Social connections:    Talks on phone: Not on file    Gets together: Not on file    Attends religious service: Never    Active member of club or organization: Yes    Attends meetings of clubs or organizations: More than 4 times per year    Relationship status: Divorced  . Intimate partner violence:    Fear of current or ex partner: No    Emotionally abused: No    Physically abused: No    Forced sexual activity: No  Other Topics Concern  . Not on file  Social History Narrative  . Not on file    Family History  Problem Relation Age of Onset  . Diabetes Mother   . Hypertension Mother   . Heart disease Unknown        No family history  . Breast cancer Sister        diagnosed in her 45's  . Anxiety disorder Sister   . Depression Sister   . Anxiety disorder Brother   . Depression Brother   . Anxiety disorder Brother   . Depression Brother   . Colon cancer Neg Hx     Past Surgical History:  Procedure Laterality Date  . ABDOMINAL HYSTERECTOMY    . COLONOSCOPY WITH PROPOFOL N/A 04/18/2015   IOX:BDZHGDJM hemorrhoids/diverticulosis sigmoid colon/  . ESOPHAGOGASTRODUODENOSCOPY (EGD) WITH PROPOFOL N/A 04/18/2015   SLF:web in the proximal esophagus/dilated/  . GANGLION CYST EXCISION    . LUMBAR LAMINECTOMY/DECOMPRESSION MICRODISCECTOMY N/A 02/08/2015   Procedure: L4-5 Decompression;  Surgeon: Marybelle Killings, MD;  Location: [place];  Service:  Orthopedics;  Laterality: N/A;  . POLYPECTOMY  04/18/2015   Procedure: POLYPECTOMY;  Surgeon: Danie Binder, MD;  [facility];  Service: Endoscopy;;  ascending colon polyp  . TONSILLECTOMY      ROS: Review of Systems Negative except as above. PHYSICAL EXAM: BP (!) 153/94   Pulse 82   Temp 97.9 F (36.6 C) (Oral)   Resp 16   Wt 225 lb (102.1 kg)   SpO2 100%   BMI 36.32 kg/m   Physical Exam  BP 154/90 General appearance - alert, well appearing, and in no distress Mental status -on and flat affect. Chest - clear to auscultation, no wheezes, rales or rhonchi, symmetric air entry Heart - normal rate, regular rhythm, normal S1, S2, no murmurs, rubs, clicks or gallops Extremities -no lower extremity edema. MSK: Ambulates with a cane for the blind.  Lab Results  Component Value Date   CHOL 280 (H) 10/30/2017   HDL 66 10/30/2017   LDLCALC 195 (H) 10/30/2017   TRIG 95 10/30/2017   CHOLHDL 4.2 10/30/2017     Chemistry      Component Value Date/Time   NA 142 07/20/2017 1423   NA 144 01/29/2017 1039   K 3.6 07/20/2017 1423   CL 107 07/20/2017 1423   CO2 25 07/20/2017 1423   BUN 8 07/20/2017 1423   BUN 6 (L) 01/29/2017 1039   CREATININE 0.76 07/20/2017 1423   CREATININE 0.71 02/12/2016 1628      Component Value Date/Time   CALCIUM 9.1 07/20/2017 1423   ALKPHOS 73 03/07/2017 1145   AST 26 03/07/2017 1145   ALT 11 (L) 03/07/2017 1145   BILITOT 1.3 (H) 03/07/2017 1145   BILITOT 1.3 (H) 01/29/2017 1039       ASSESSMENT AND PLAN:  1. Essential hypertension Not at goal.  Patient to continue lisinopril/HCTZ.  Patient told to restart the amlodipine. - lisinopril-hydrochlorothiazide (PRINZIDE,ZESTORETIC) 20-25 MG tablet; Take 1 tablet by mouth daily.  Dispense: 90 tablet; Refill: 3 - amLODipine (NORVASC) 5 MG tablet; Take 1 tablet (5 mg total) by mouth daily.  Dispense: 90 tablet; Refill: 3  2. Frequent PVCs On metoprolol.  3. Hyperlipidemia, unspecified  hyperlipidemia type Pravachol changed to Crestor.  4. Chronic midline low back pain without sciatica - gabapentin (NEURONTIN) 300 MG capsule; Take 1 capsule (300 mg total) by mouth 3 (three) times daily.  Dispense: 90 capsule; Refill: 3 - cyclobenzaprine (FLEXERIL) 5 MG tablet; Take 1 tablet (5 mg total) by mouth daily as needed for muscle spasms.  Dispense: 30 tablet; Refill: 1  5. Muscle spasm - cyclobenzaprine (FLEXERIL) 5 MG tablet; Take 1 tablet (5 mg total) by mouth daily as needed for muscle spasms.  Dispense: 30 tablet; Refill: 1  6. Blurred vision - Ambulatory referral to Ophthalmology   Patient was given the opportunity to ask questions.  Patient verbalized understanding of the plan and was able to repeat key elements of the plan.   No orders of the defined types were placed in this encounter.    Requested Prescriptions   Pending Prescriptions Disp Refills  . gabapentin (NEURONTIN) 300 MG capsule 90 capsule 3    Sig: Take 1 capsule (300 mg total) by mouth 3 (three) times daily.  . cyclobenzaprine (FLEXERIL) 5 MG tablet 30 tablet 1    Sig: Take 1 tablet (5 mg total) by mouth daily as needed for muscle spasms.  Marland Kitchen lisinopril-hydrochlorothiazide (PRINZIDE,ZESTORETIC) 20-25 MG tablet 90 tablet 3    Sig: Take 1 tablet by mouth daily.    No follow-ups on file.  Karle Plumber, MD, FACP

## 2017-12-29 NOTE — Progress Notes (Signed)
Pt states she is also having neck pain  Pt stated she is needing refills on bp medicine pt has enough amlodipine but lisinopril-hctz pt is not taking constantly   Per Lurena Joiner pt had filled lisinopril-hctz on 12/02/17 but rx was originally written 01/29/2017

## 2017-12-30 MED FILL — GABAPENTIN 300 MG CAPSULE: 300 | 30 days supply | Qty: 90 | Fill #0

## 2017-12-30 MED FILL — LISINOPRIL-HCTZ 20-25 MG TA: 20-25 | 90 days supply | Qty: 90 | Fill #0

## 2017-12-30 MED FILL — CYCLOBENZAPRINE 5 MG TABLET: 5 | 30 days supply | Qty: 30 | Fill #0

## 2018-01-05 ENCOUNTER — Other Ambulatory Visit: Payer: Medicare HMO

## 2018-01-07 ENCOUNTER — Ambulatory Visit (INDEPENDENT_AMBULATORY_CARE_PROVIDER_SITE_OTHER): Payer: Medicare HMO | Admitting: Orthopaedic Surgery

## 2018-01-07 ENCOUNTER — Ambulatory Visit (INDEPENDENT_AMBULATORY_CARE_PROVIDER_SITE_OTHER): Payer: Medicare HMO

## 2018-01-07 ENCOUNTER — Encounter (INDEPENDENT_AMBULATORY_CARE_PROVIDER_SITE_OTHER): Payer: Self-pay | Admitting: Orthopaedic Surgery

## 2018-01-07 VITALS — BP 139/95 | HR 85 | Ht 66.0 in | Wt 225.0 lb

## 2018-01-07 DIAGNOSIS — M47812 Spondylosis without myelopathy or radiculopathy, cervical region: Secondary | ICD-10-CM | POA: Diagnosis not present

## 2018-01-07 DIAGNOSIS — M25511 Pain in right shoulder: Secondary | ICD-10-CM

## 2018-01-07 NOTE — Progress Notes (Signed)
Office Visit Note   Patient: Lorraine Ryan           Date of Birth: 04/01/1955           MRN: [id number] Visit Date: 01/07/2018              Requested by: Ladell Pier, MD [address] [place], [place] [address] PCP: Ladell Pier, MD   Assessment & Plan: Visit Diagnoses:  1. Acute pain of right shoulder   2. Spondylosis without myelopathy or radiculopathy, cervical region     Plan: We will set patient up for some physical therapy recheck her in 5 weeks.  If she does not respond to continue conservative treatment will consider diagnostic MRI imaging to rule out compression on the right side.  Follow-Up Instructions: No follow-ups on file.   Orders:  Orders Placed This Encounter  Procedures  . XR Cervical Spine 2 or 3 views  . XR Shoulder Right   No orders of the defined types were placed in this encounter.     Procedures: No procedures performed   Clinical Data: No additional findings.   Subjective: Chief Complaint  Patient presents with  . Neck - Pain  . Right Shoulder - Pain    HPI 62 year old female here with neck and right shoulder pain.  She is had previous subacromial injection her shoulder which gave her some temporary improvement and returns with difficulty rotating her neck.  Patient states she stays with her son who was in [place] she states she is legally blind has a white cane but has bifocals on, tracks with her eyes visually also drives a car.  She states she has some cataracts but not bad enough for surgery.  The therapy for her neck.  Is fever associated chills.  Patient's been on gabapentin and also Naprosyn.  Patient take trazodone that helps her sleep at night.  Patient states she is had numbness that radiates in the radial 3 fingers it does not wake her up at night.  Review of Systems positive for drug use in the remote past.  Previous lumbar laminectomy 2017 doing well.  Positive for depression, followed by psychiatry.  Previous  lumbar decompression by me 2017 doing well.  Positive hyperlipidemia, hypertension.  Otherwise negative as it pertains HPI.   Objective: Vital Signs: BP (!) 139/95   Pulse 85   Ht 5\' 6"  (1.676 m)   Wt 225 lb (102.1 kg)   BMI 36.32 kg/m   Physical Exam  Constitutional: She is oriented to person, place, and time. She appears well-developed.  HENT:  Head: Normocephalic.  Right Ear: External ear normal.  Left Ear: External ear normal.  Eyes: Pupils are equal, round, and reactive to light.  Neck: No tracheal deviation present. No thyromegaly present.  Cardiovascular: Normal rate.  Pulmonary/Chest: Effort normal.  Abdominal: Soft.  Neurological: She is alert and oriented to person, place, and time.  Skin: Skin is warm and dry.  Psychiatric: She has a normal mood and affect. Her behavior is normal.    Ortho Exam get her arm up overhead minimal discomfort with impingement AC joint is nontender negative crossarm adduction test.  Upper extremity reflexes are 2+ and symmetrical no isolated motor weakness upper extremities.  Normal visual tracking, she looks in her purse to identify objects and pull them out.  Specialty Comments:  No specialty comments available.  Imaging: Xr Cervical Spine 2 Or 3 Views  Result Date: 01/07/2018 [facility] lateral cervical spine x-rays  are obtained and reviewed.  This shows anterior spurring at C5-6 and more spurring at C6-7.  No anterolisthesis some loss of normal cervical lordosis.  Uncovertebral changes noted worse at C5-6 and C6-7. Impression: Cervical spondylosis C5-6 C6-7 without acute changes.  Xr Shoulder Right  Result Date: 01/07/2018 Three-view x-rays right shoulder obtained and reviewed.  This shows tiny inferior glenoid spurs.  Mild acromioclavicular narrowing with spurring.  Negative for acute fracture. Impression: Minimal glenohumeral degenerative changes and moderate acromioclavicular degenerative changes.    PMFS History: Patient Active Problem  List   Diagnosis Date Noted  . Frequent PVCs 12/29/2017  . History of fall 10/23/2017  . Depression   . Anxiety   . Abdominal pain, epigastric 03/30/2015  . Dysphagia 03/30/2015  . History of lumbar laminectomy for spinal cord decompression 02/08/2015  . Hyperlipidemia 04/20/2014  . Low back pain radiating to right leg 03/01/2013  . Numbness in right leg 03/01/2013  . Chest pain on exertion 03/01/2013  . Hypertension   . Stroke [facility])    Past Medical History:  Diagnosis Date  . Anxiety   . Back pain   . Bipolar disorder ([place])   . Chronic headaches   . Dementia ([place])   . Depression   . Diabetes mellitus without complication ([place])   . GERD (gastroesophageal reflux disease)   . Hypertension    No meds prescribed; states intermittent  . Neuromuscular disorder ([place])   . Shortness of breath dyspnea   . Stroke [facility])    caused numbness in leg    Family History  Problem Relation Age of Onset  . Diabetes Mother   . Hypertension Mother   . Heart disease Unknown        No family history  . Breast cancer Sister        diagnosed in her 35's  . Anxiety disorder Sister   . Depression Sister   . Anxiety disorder Brother   . Depression Brother   . Anxiety disorder Brother   . Depression Brother   . Colon cancer Neg Hx     Past Surgical History:  Procedure Laterality Date  . ABDOMINAL HYSTERECTOMY    . COLONOSCOPY WITH PROPOFOL N/A 04/18/2015   VZC:HYIFOYDX hemorrhoids/diverticulosis sigmoid colon/  . ESOPHAGOGASTRODUODENOSCOPY (EGD) WITH PROPOFOL N/A 04/18/2015   SLF:web in the proximal esophagus/dilated/  . GANGLION CYST EXCISION    . LUMBAR LAMINECTOMY/DECOMPRESSION MICRODISCECTOMY N/A 02/08/2015   Procedure: L4-5 Decompression;  Surgeon: Marybelle Killings, MD;  Location: [place];  Service: Orthopedics;  Laterality: N/A;  . POLYPECTOMY  04/18/2015   Procedure: POLYPECTOMY;  Surgeon: Danie Binder, MD;  [facility];  Service: Endoscopy;;  ascending colon polyp  .  TONSILLECTOMY     Social History   Occupational History  . Not on file  Tobacco Use  . Smoking status: Former Smoker    Types: Cigarettes    Last attempt to quit: 03/29/1985    Years since quitting: 32.8  . Smokeless tobacco: Never Used  Substance and Sexual Activity  . Alcohol use: No    Alcohol/week: 0.0 standard drinks    Comment: Used to drink heavily, no ETOH in "30-some years"  . Drug use: No    Comment: History of crack, "no drugs in 30-some years"  . Sexual activity: Not Currently

## 2018-01-12 ENCOUNTER — Other Ambulatory Visit: Payer: Medicare HMO | Admitting: *Deleted

## 2018-01-12 DIAGNOSIS — Z79899 Other long term (current) drug therapy: Secondary | ICD-10-CM

## 2018-01-12 LAB — LIPID PANEL
CHOLESTEROL TOTAL: 175 mg/dL (ref 100–199)
Chol/HDL Ratio: 2.2 ratio (ref 0.0–4.4)
HDL: 79 mg/dL (ref >39)
LDL Calculated: 84 mg/dL (ref 0–99)
TRIGLYCERIDES: 62 mg/dL (ref 0–149)
VLDL Cholesterol Cal: 12 mg/dL (ref 5–40)

## 2018-01-12 LAB — HEPATIC FUNCTION PANEL
ALBUMIN: 4.2 g/dL (ref 3.6–4.8)
ALT: 14 IU/L (ref 0–32)
AST: 25 IU/L (ref 0–40)
Alkaline Phosphatase: 85 IU/L (ref 39–117)
Bilirubin Total: 1.1 mg/dL (ref 0.0–1.2)
Bilirubin, Direct: 0.25 mg/dL (ref 0.00–0.40)
Total Protein: 6.7 g/dL (ref 6.0–8.5)

## 2018-01-22 MED FILL — AMLODIPINE BESYLATE 5 MG TA: 5 | 90 days supply | Qty: 90 | Fill #1

## 2018-01-23 MED FILL — GABAPENTIN 300 MG CAPSULE: 300 | 30 days supply | Qty: 90 | Fill #1

## 2018-01-23 MED FILL — traZODone HCL 100 MG TABS: 100 | 30 days supply | Qty: 30 | Fill #1

## 2018-01-23 MED FILL — CYCLOBENZAPRINE 5 MG TABLET: 5 | 30 days supply | Qty: 30 | Fill #1

## 2018-01-23 MED FILL — DULoxetine HCL 60 MG CPEP: 60 | 30 days supply | Qty: 30 | Fill #1

## 2018-01-30 MED FILL — ZIPRASIDONE HCL 60 MG CAP: 60 | 30 days supply | Qty: 30 | Fill #1

## 2018-02-02 MED FILL — ROSUVASTATIN CALCIUM 40 MG: 40 | 90 days supply | Qty: 90 | Fill #1

## 2018-02-26 ENCOUNTER — Encounter (HOSPITAL_COMMUNITY): Payer: Self-pay | Admitting: Psychiatry

## 2018-02-26 ENCOUNTER — Ambulatory Visit (INDEPENDENT_AMBULATORY_CARE_PROVIDER_SITE_OTHER): Payer: Medicare Other | Admitting: Psychiatry

## 2018-02-26 VITALS — BP 128/80 | HR 76 | Ht 66.0 in | Wt 220.0 lb

## 2018-02-26 DIAGNOSIS — F411 Generalized anxiety disorder: Secondary | ICD-10-CM

## 2018-02-26 DIAGNOSIS — F319 Bipolar disorder, unspecified: Secondary | ICD-10-CM | POA: Diagnosis not present

## 2018-02-26 MED ORDER — TRAZODONE HCL 100 MG PO TABS: 100.0000 mg | ORAL_TABLET | Freq: Every day | ORAL | 2 refills | Status: DC

## 2018-02-26 MED ORDER — DULOXETINE HCL 60 MG PO CPEP: 60.0000 mg | ORAL_CAPSULE | Freq: Every day | ORAL | 2 refills | Status: DC

## 2018-02-26 MED ORDER — ZIPRASIDONE HCL 80 MG PO CAPS: 80.0000 mg | ORAL_CAPSULE | Freq: Every day | ORAL | 2 refills | Status: DC

## 2018-02-26 NOTE — Progress Notes (Signed)
BH MD/PA/NP OP Progress Note  02/26/2018 8:24 AM Lorraine Ryan  MRN:  [id number]  Chief Complaint: Still hear voices.  I feel paranoid and does not go outside.  HPI: Lorraine Ryan a 63 year old divorced African-American unemployed female who was seen first time by Dr. Harrington Challenger on November 23 as initial evaluation.  Patient has a history of psychiatric illness.  She was referred from her primary care physician Dr. Karle Plumber.  She carries a diagnosis of bipolar disorder and seen [facility] the [facility] but did not like the care there.  She is taking Geodon, Cymbalta and trazodone which was continued by Dr. Levonne Spiller.  Patient lives most of the time at her son's house and then with the family members.  She is trying to have her own place.  She had a place but 3 years ago when she saw a snake on the porch she left.  Patient admitted that she continues to hear voices and feel paranoid around people.  She is a poor historian and most of the information was obtained through electronic medical record.  She complained that she has a vision impairment and she uses the cane to help her balance and aid for walking but she also drive to doctor's appointment.  Her primary care physician referred her to see ophthalmology but she did not keep that appointment.  She endorsed that voices are rambling and sometimes she see images and insects but there are no command auditory hallucination.  She reports that there was never a day when she did not hear voices but usually intensity is variable.  Patient has no history of suicidal attempt or any aggressive behavior.  She has no tremors, shakes or any EPS.  She has health issues including back pain, neck pain and recently she seen orthopedic for the shoulder pain.  She denies any crying spells, feeling of hopelessness or worthlessness.  She denies any mania in recent months.  However she continued to exhibit paranoia and hallucinations.  She had a history of drug use but claims to  be sober for a while.  Her biggest challenges to find a stable house which she is working.  She feel the trazodone and Cymbalta and Geodon is working most of the time except that she has hallucinations and paranoia.  She has no tremors, shakes or any EPS.  Her energy level is fair.  In the past she had tried Abilify but unclear why it was switched to Geodon.  She also tried [place] but claims that it made her sick.  Patient has 5 children with multiple relationship.  She endorsed history of anger and mania in the past.  Patient has chronic headaches, diabetes, GERD, hypertension and chronic pain.  Visit Diagnosis:    ICD-10-CM   1. Bipolar I disorder (HCC) F31.9 ziprasidone (GEODON) 80 MG capsule    traZODone (DESYREL) 100 MG tablet  2. GAD (generalized anxiety disorder) F41.1 traZODone (DESYREL) 100 MG tablet    DULoxetine (CYMBALTA) 60 MG capsule    Past Psychiatric History: Reviewed. No history of psychiatric inpatient treatment or any suicidal attempt.  History of paranoia, hallucination, mania, anger and drug use.  Seen in the past at [facility] and then [facility] and prescribe Latuda which made her sick.  Tried Abilify but do not remember why it was switched to Geodon.  No history of abuse, no history of panic attacks.  Past Medical History:  Past Medical History:  Diagnosis Date  . Anxiety   . Back pain   .  Bipolar disorder ([place])   . Chronic headaches   . Dementia ([place])   . Depression   . Diabetes mellitus without complication ([place])   . GERD (gastroesophageal reflux disease)   . Hypertension    No meds prescribed; states intermittent  . Neuromuscular disorder ([place])   . Shortness of breath dyspnea   . Stroke [facility])    caused numbness in leg    Past Surgical History:  Procedure Laterality Date  . ABDOMINAL HYSTERECTOMY    . COLONOSCOPY WITH PROPOFOL N/A 04/18/2015   QIH:[id number] hemorrhoids/diverticulosis sigmoid colon/  . ESOPHAGOGASTRODUODENOSCOPY (EGD) WITH PROPOFOL N/A 04/18/2015    SLF:web in the proximal esophagus/dilated/  . GANGLION CYST EXCISION    . LUMBAR LAMINECTOMY/DECOMPRESSION MICRODISCECTOMY N/A 02/08/2015   Procedure: L4-5 Decompression;  Surgeon: Marybelle Killings, MD;  Location: [place];  Service: Orthopedics;  Laterality: N/A;  . POLYPECTOMY  04/18/2015   Procedure: POLYPECTOMY;  Surgeon: Danie Binder, MD;  Location[facility];  Service: Endoscopy;;  ascending colon polyp  . TONSILLECTOMY      Family Psychiatric History: Reviewed. Sister and brother has depression and anxiety.  Family History:  Family History  Problem Relation Age of Onset  . Diabetes Mother   . Hypertension Mother   . Heart disease Other        No family history  . Breast cancer Sister        diagnosed in her 58's  . Anxiety disorder Sister   . Depression Sister   . Anxiety disorder Brother   . Depression Brother   . Anxiety disorder Brother   . Depression Brother   . Colon cancer Neg Hx     Social History:  Social History   Socioeconomic History  . Marital status: Divorced    Spouse name: Not on file  . Number of children: 5  . Years of education: Not on file  . Highest education level: Associate degree: occupational, Hotel manager, or vocational program  Occupational History  . Not on file  Social Needs  . Financial resource strain: Very hard  . Food insecurity:    Worry: Often true    Inability: Often true  . Transportation needs:    Medical: No    Non-medical: No  Tobacco Use  . Smoking status: Former Smoker    Types: Cigarettes    Last attempt to quit: 03/29/1985    Years since quitting: 32.9  . Smokeless tobacco: Never Used  Substance and Sexual Activity  . Alcohol use: No    Alcohol/week: 0.0 standard drinks    Comment: Used to drink heavily, no ETOH in "30-some years"  . Drug use: No    Comment: History of crack, "no drugs in 30-some years"  . Sexual activity: Not Currently  Lifestyle  . Physical activity:    Days per week: 1 day    Minutes per  session: 60 min  . Stress: Very much  Relationships  . Social connections:    Talks on phone: Not on file    Gets together: Not on file    Attends religious service: Never    Active member of club or organization: Yes    Attends meetings of clubs or organizations: More than 4 times per year    Relationship status: Divorced  Other Topics Concern  . Not on file  Social History Narrative  . Not on file    Allergies:  Allergies  Allergen Reactions  . Codeine Other (See Comments)    Headache  .  Oxycodone Nausea Only and Palpitations  . Prednisone Other (See Comments)    Headache  . Tramadol Nausea Only    Metabolic Disorder Labs: Lab Results  Component Value Date   HGBA1C 5.1 03/19/2016   No results found for: PROLACTIN Lab Results  Component Value Date   CHOL 175 01/12/2018   TRIG 62 01/12/2018   HDL 79 01/12/2018   CHOLHDL 2.2 01/12/2018   VLDL 17 03/07/2014   LDLCALC 84 01/12/2018   LDLCALC 195 (H) 10/30/2017   Lab Results  Component Value Date   TSH 1.061 03/07/2014   TSH 1.238 05/27/2013    Therapeutic Level Labs: No results found for: LITHIUM No results found for: VALPROATE No components found for:  CBMZ  Current Medications: Current Outpatient Medications  Medication Sig Dispense Refill  . amLODipine (NORVASC) 5 MG tablet Take 1 tablet (5 mg total) by mouth daily. 90 tablet 3  . cyclobenzaprine (FLEXERIL) 5 MG tablet Take 1 tablet (5 mg total) by mouth daily as needed for muscle spasms. 30 tablet 1  . DULoxetine (CYMBALTA) 60 MG capsule Take 1 capsule (60 mg total) by mouth daily. 30 capsule 2  . gabapentin (NEURONTIN) 300 MG capsule Take 1 capsule (300 mg total) by mouth 3 (three) times daily. 90 capsule 3  . lisinopril-hydrochlorothiazide (PRINZIDE,ZESTORETIC) 20-25 MG tablet Take 1 tablet by mouth daily. 90 tablet 3  . metoprolol succinate (TOPROL XL) 25 MG 24 hr tablet Take 1 tablet (25 mg total) by mouth daily. 90 tablet 3  . naproxen (NAPROSYN)  500 MG tablet Take 1 tablet (500 mg total) by mouth daily as needed for moderate pain. With food 60 tablet 2  . rosuvastatin (CRESTOR) 40 MG tablet Take 1 tablet (40 mg total) by mouth daily. 90 tablet 3  . traZODone (DESYREL) 100 MG tablet Take 1 tablet (100 mg total) by mouth at bedtime. 30 tablet 2  . ziprasidone (GEODON) 60 MG capsule Take 1 capsule (60 mg total) by mouth daily. Take with food 30 capsule 2   No current facility-administered medications for this visit.      Musculoskeletal: Strength & Muscle Tone: decreased Gait & Station: use cain to help balance Patient leans: N/A  Psychiatric Specialty Exam: Review of Systems  Musculoskeletal: Positive for back pain and joint pain.  Psychiatric/Behavioral: Positive for hallucinations.    Blood pressure 128/80, pulse 76, height 5\' 6"  (1.676 m), weight 220 lb (99.8 kg).Body mass index is 35.51 kg/m.  General Appearance: Casual and Fairly Groomed  Eye Contact:  Poor  Speech:  Slow  Volume:  Decreased  Mood:  Dysphoric  Affect:  Constricted  Thought Process:  Descriptions of Associations: Circumstantial  Orientation:  Full (Time, Place, and Person)  Thought Content: Hallucinations: Auditory Visual people rambling and Paranoid Ideation   Suicidal Thoughts:  No  Homicidal Thoughts:  No  Memory:  Immediate;   Fair Recent;   Poor Remote;   Poor  Judgement:  Fair  Insight:  Fair  Psychomotor Activity:  Decreased  Concentration:  Concentration: Fair and Attention Span: Fair  Recall:  Poor  Fund of Knowledge: Fair  Language: Fair  Akathisia:  No  Handed:  Right  AIMS (if indicated): not done  Assets:  Desire for Improvement Transportation  ADL's:  Intact  Cognition: Impaired,  Mild  Sleep:  Fair   Screenings: GAD-7     Office Visit from 02/29/2016 in [place] Office Visit from 02/12/2016 in [facility] Office Visit from 01/12/2016 in [place] Office Visit from 11/07/2015 in [place]  Total GAD-7 Score  15  13  21  18     PHQ2-9     Office Visit from 06/10/2017 in [place] Office Visit from 02/29/2016 in [place] Office Visit from 02/12/2016 in [place] Office Visit from 01/12/2016 in [place] Office Visit from 11/07/2015 in [place]  PHQ-2 Total Score  6  5  4  3  6   PHQ-9 Total Score  12  12  15  14  14        Assessment and Plan: Nettye is 63 year old African-American divorced unemployed female who came for her follow-up appointment.  I reviewed her symptoms, history, current medication.  Her psychiatric condition is chronic but stable.  She is still have hallucinations and paranoia and she does not leave the house because of paranoia.  We discussed trying increasing Geodon to 80 mg at bedtime to help the symptoms.  She agreed with the plan.  I also encouraged that she should seek eye doctor as she is complaining of vision impairment and require cane to help her balance.  She promised to keep appointment.  She also actively looking for a stable housing.  We discussed medication side effects and benefits.  We discussed safety concerns at any time having active suicidal thoughts or homicidal thought that she need to call 911 or go to local emergency room.  We will try to get records from [place].  I will see her again in 3 months. Time spent 30 minutes.  More than 50% of the time spent in psychoeducation, counseling and coordination of care.  Discuss safety plan that anytime having active suicidal thoughts or homicidal thoughts then patient need to call 911 or go to the local emergency room.     Kathlee Nations, MD 02/26/2018, 8:24 AM

## 2018-03-03 ENCOUNTER — Encounter: Payer: Self-pay | Admitting: Family Medicine

## 2018-03-03 ENCOUNTER — Ambulatory Visit (INDEPENDENT_AMBULATORY_CARE_PROVIDER_SITE_OTHER): Payer: Medicare Other | Admitting: Family Medicine

## 2018-03-03 VITALS — BP 144/88 | HR 79 | Temp 98.1°F | Ht 66.0 in | Wt 221.8 lb

## 2018-03-03 DIAGNOSIS — M25511 Pain in right shoulder: Secondary | ICD-10-CM | POA: Insufficient documentation

## 2018-03-03 DIAGNOSIS — M545 Low back pain, unspecified: Secondary | ICD-10-CM

## 2018-03-03 DIAGNOSIS — I10 Essential (primary) hypertension: Secondary | ICD-10-CM | POA: Diagnosis not present

## 2018-03-03 DIAGNOSIS — M62838 Other muscle spasm: Secondary | ICD-10-CM | POA: Diagnosis not present

## 2018-03-03 DIAGNOSIS — G8929 Other chronic pain: Secondary | ICD-10-CM | POA: Diagnosis not present

## 2018-03-03 MED ORDER — GABAPENTIN 300 MG PO CAPS: 300.0000 mg | ORAL_CAPSULE | Freq: Three times a day (TID) | ORAL | 3 refills | Status: DC

## 2018-03-03 MED ORDER — AMLODIPINE BESYLATE 10 MG PO TABS: 10.0000 mg | ORAL_TABLET | Freq: Every day | ORAL | 0 refills | Status: DC

## 2018-03-03 MED ORDER — CYCLOBENZAPRINE HCL 5 MG PO TABS: 5.0000 mg | ORAL_TABLET | Freq: Every day | ORAL | 1 refills | Status: DC | PRN

## 2018-03-03 NOTE — Assessment & Plan Note (Signed)
Refill gabapentin and flexeril. Discussed trying to decrease the frequency of taking flexeril.

## 2018-03-03 NOTE — Patient Instructions (Addendum)
#  High Blood pressure - Start taking 10 mg of amlodipine - return in 2 months   #Shoulder pain - Go to physical therapy - Call back the orthopedic doctor

## 2018-03-03 NOTE — Assessment & Plan Note (Signed)
BP elevated on 4 medications. Though difficult historian and unclear if she is taking everything. Will increase amlodipine and recheck in a couple of months

## 2018-03-03 NOTE — Assessment & Plan Note (Signed)
Saw ortho who recommended PT which she has not done. PT referral today and advised f/u with ortho

## 2018-03-03 NOTE — Progress Notes (Signed)
Subjective:     Lorraine Ryan is a 63 y.o. female presenting for Establish Care (previous PCP Karle Plumber. No gyn. Patient does see psychiatrist-Dr. Carrolyn Meiers in [place].)     HPI    #neck/shoulder pain - recently went to the orthopedic doctor but no additional help - taking naproxen and gabapentin - taking flexeril almost every day - ortho note 01/07/2018 was suppose to go to PT - had some transportation issues -   #prior CVA - still with numbness in the leg  #Financial resources - stays with son but no heat - already has disability  - does not currently use food stamps and has not looked into seeing if she qualifies  #bipolar - seeing a psychiatrist - they are prescribing the psych medications - feels like things are going alright  #HTN - checks bp at home - can be high at home  #vision issues - using a cane to walk - planning to make an appointment  - "blind really" - occasionally blurry vision  Review of Systems  Respiratory: Negative for shortness of breath.   Cardiovascular: Positive for chest pain (was started on muscle relaxants for this).  Neurological: Negative for headaches.     Social History   Tobacco Use  Smoking Status Former Smoker  . Packs/day: 0.50  . Years: 10.00  . Pack years: 5.00  . Types: Cigarettes  . Last attempt to quit: 03/29/1985  . Years since quitting: 32.9  Smokeless Tobacco Never Used        Objective:    BP Readings from Last 3 Encounters:  03/03/18 (!) 144/88  01/07/18 (!) 139/95  12/29/17 (!) 153/94   Wt Readings from Last 3 Encounters:  03/03/18 221 lb 12 oz (100.6 kg)  01/07/18 225 lb (102.1 kg)  12/29/17 225 lb (102.1 kg)    BP (!) 144/88   Pulse 79   Temp 98.1 F (36.7 C)   Ht 5\' 6"  (1.676 m)   Wt 221 lb 12 oz (100.6 kg)   SpO2 100%   BMI 35.79 kg/m    Physical Exam Constitutional:      General: She is not in acute distress.    Appearance: She is well-developed. She is not  diaphoretic.     Comments: Wearing eye glasses, has blind cane, intermittent eye contact  HENT:     Right Ear: External ear normal.     Left Ear: External ear normal.     Nose: Nose normal.  Eyes:     Conjunctiva/sclera: Conjunctivae normal.  Neck:     Musculoskeletal: Neck supple.  Cardiovascular:     Rate and Rhythm: Normal rate and regular rhythm.     Heart sounds: No murmur.  Pulmonary:     Effort: Pulmonary effort is normal. No respiratory distress.     Breath sounds: Normal breath sounds. No wheezing.  Musculoskeletal:     Comments: Decreased right shoulder ROM 2/2 to pain  Skin:    General: Skin is warm and dry.     Capillary Refill: Capillary refill takes less than 2 seconds.  Neurological:     Mental Status: She is alert. Mental status is at baseline.  Psychiatric:        Mood and Affect: Mood normal.        Behavior: Behavior normal.           Assessment & Plan:   Problem List Items Addressed This Visit      Cardiovascular and Mediastinum  Hypertension - Primary    BP elevated on 4 medications. Though difficult historian and unclear if she is taking everything. Will increase amlodipine and recheck in a couple of months      Relevant Medications   rosuvastatin (CRESTOR) 40 MG tablet   amLODipine (NORVASC) 10 MG tablet     Other   Chronic midline low back pain without sciatica    Refill gabapentin and flexeril. Discussed trying to decrease the frequency of taking flexeril.       Relevant Medications   cyclobenzaprine (FLEXERIL) 5 MG tablet   gabapentin (NEURONTIN) 300 MG capsule   Other Relevant Orders   Ambulatory referral to Physical Therapy   Acute pain of right shoulder    Saw ortho who recommended PT which she has not done. PT referral today and advised f/u with ortho      Relevant Orders   Ambulatory referral to Physical Therapy   Muscle spasm   Relevant Medications   cyclobenzaprine (FLEXERIL) 5 MG tablet       Return in about 2  months (around 05/02/2018).  Lesleigh Noe, MD

## 2018-03-05 ENCOUNTER — Encounter: Payer: Self-pay | Admitting: Family Medicine

## 2018-03-09 ENCOUNTER — Ambulatory Visit: Payer: Self-pay | Admitting: Family Medicine

## 2018-03-11 ENCOUNTER — Ambulatory Visit: Payer: Self-pay | Admitting: Internal Medicine

## 2018-03-13 ENCOUNTER — Telehealth: Payer: Self-pay | Admitting: *Deleted

## 2018-03-13 DIAGNOSIS — M542 Cervicalgia: Secondary | ICD-10-CM | POA: Diagnosis not present

## 2018-03-13 DIAGNOSIS — M25511 Pain in right shoulder: Secondary | ICD-10-CM | POA: Diagnosis not present

## 2018-03-13 NOTE — Telephone Encounter (Signed)
New referral placed for neck pain.   Also routing to MA and referral coordinator with help for sending medication list.

## 2018-03-13 NOTE — Telephone Encounter (Signed)
Lorraine Ryan physical therapist with [facility] called stating that she saw the patient today and had an order for shoulder and back PT. Lorraine Ryan stated that she would like an order for neck also because that seems to be where most of her problems are coming from. Lorraine Ryan also stated that she would like a medication list because patient was not able to give her much information today. Advised Lorraine Ryan that Dr. Einar Pheasant is out today and will hear back from her Monday. Lorraine Ryan stated that is fine because they close today at 1:00.

## 2018-03-16 NOTE — Telephone Encounter (Signed)
Order and medication list faxed over to [phone]

## 2018-03-19 MED FILL — ROSUVASTATIN CALCIUM 40 MG: 40 | 90 days supply | Qty: 90 | Fill #1

## 2018-03-27 ENCOUNTER — Other Ambulatory Visit: Payer: Self-pay | Admitting: Family Medicine

## 2018-03-27 DIAGNOSIS — Z1231 Encounter for screening mammogram for malignant neoplasm of breast: Secondary | ICD-10-CM

## 2018-04-03 MED FILL — traZODone HCL 100 MG TABS: 100 | 30 days supply | Qty: 30 | Fill #2

## 2018-04-03 MED FILL — METOPROLOL SUCCINATE ER 25: 25 | 90 days supply | Qty: 90 | Fill #1

## 2018-04-03 MED FILL — LISINOPRIL-HCTZ 20-25 MG TA: 20-25 | 90 days supply | Qty: 90 | Fill #1

## 2018-04-03 MED FILL — GABAPENTIN 300 MG CAPSULE: 300 | 30 days supply | Qty: 90 | Fill #2

## 2018-04-03 MED FILL — ZIPRASIDONE HCL 60 MG CAP: 60 | 30 days supply | Qty: 30 | Fill #2

## 2018-04-03 MED FILL — DULoxetine HCL 60 MG CPEP: 60 | 30 days supply | Qty: 30 | Fill #2

## 2018-04-03 MED FILL — NAPROXEN 500 MG TABLET: 500 | 30 days supply | Qty: 30 | Fill #3

## 2018-04-20 ENCOUNTER — Emergency Department (HOSPITAL_BASED_OUTPATIENT_CLINIC_OR_DEPARTMENT_OTHER)
Admission: EM | Admit: 2018-04-20 | Discharge: 2018-04-20 | Disposition: A | Discharge status: Home / Self Care | Payer: Medicare Other | Attending: Emergency Medicine | Admitting: Emergency Medicine

## 2018-04-20 ENCOUNTER — Other Ambulatory Visit: Payer: Self-pay

## 2018-04-20 ENCOUNTER — Encounter (HOSPITAL_BASED_OUTPATIENT_CLINIC_OR_DEPARTMENT_OTHER): Payer: Self-pay | Admitting: Emergency Medicine

## 2018-04-20 DIAGNOSIS — F319 Bipolar disorder, unspecified: Secondary | ICD-10-CM | POA: Diagnosis not present

## 2018-04-20 DIAGNOSIS — Z79899 Other long term (current) drug therapy: Secondary | ICD-10-CM | POA: Insufficient documentation

## 2018-04-20 DIAGNOSIS — E119 Type 2 diabetes mellitus without complications: Secondary | ICD-10-CM | POA: Diagnosis not present

## 2018-04-20 DIAGNOSIS — F419 Anxiety disorder, unspecified: Secondary | ICD-10-CM | POA: Insufficient documentation

## 2018-04-20 DIAGNOSIS — Z8673 Personal history of transient ischemic attack (TIA), and cerebral infarction without residual deficits: Secondary | ICD-10-CM | POA: Diagnosis not present

## 2018-04-20 DIAGNOSIS — F0390 Unspecified dementia without behavioral disturbance: Secondary | ICD-10-CM | POA: Insufficient documentation

## 2018-04-20 DIAGNOSIS — Z87891 Personal history of nicotine dependence: Secondary | ICD-10-CM | POA: Diagnosis not present

## 2018-04-20 DIAGNOSIS — I10 Essential (primary) hypertension: Secondary | ICD-10-CM | POA: Insufficient documentation

## 2018-04-20 DIAGNOSIS — R1030 Lower abdominal pain, unspecified: Secondary | ICD-10-CM | POA: Diagnosis present

## 2018-04-20 DIAGNOSIS — N3001 Acute cystitis with hematuria: Secondary | ICD-10-CM | POA: Diagnosis not present

## 2018-04-20 LAB — URINALYSIS, ROUTINE W REFLEX MICROSCOPIC
Bilirubin Urine: NEGATIVE
Glucose, UA: NEGATIVE mg/dL
Ketones, ur: NEGATIVE mg/dL
Nitrite: NEGATIVE
Protein, ur: 30 mg/dL — AB
Specific Gravity, Urine: <1.005 — ABNORMAL LOW (ref 1.005–1.030)
pH: 7 (ref 5.0–8.0)

## 2018-04-20 LAB — URINALYSIS, MICROSCOPIC (REFLEX): RBC / HPF: >50 RBC/hpf (ref 0–5)

## 2018-04-20 MED ORDER — CEPHALEXIN 250 MG PO CAPS
500.0000 mg | ORAL_CAPSULE | Freq: Once | ORAL | Status: AC
  Administered 2018-04-20: 500 mg via ORAL
  Filled 2018-04-20: qty 2

## 2018-04-20 MED ORDER — CEPHALEXIN 500 MG PO CAPS: 500.0000 mg | ORAL_CAPSULE | Freq: Two times a day (BID) | ORAL | 0 refills | Status: AC

## 2018-04-20 NOTE — ED Triage Notes (Signed)
Pt states she has been having abdominal pain and pressure since 3-11. Pt states she went to restroom this am and felt pressure while she was urinating and noticed blood.

## 2018-04-20 NOTE — ED Notes (Signed)
Pt walked to restroom to attempt to collect urine sample

## 2018-04-20 NOTE — ED Provider Notes (Signed)
[facility] EMERGENCY DEPARTMENT Provider Note   CSN: [id number] Arrival date & time: 04/20/18  2376    History   Chief Complaint No chief complaint on file.   HPI Lorraine Ryan is a 63 y.o. female.     HPI Patient is a 63 year old female presents the emergency department complaints of discomfort across her lower abdomen when she tries to urinate.  Some discomfort with urination.  Denies significant flank pain.  No unilateral flank pain.  No fevers or chills.  No nausea or vomiting.  She has a history of intermittent UTI.  She reports this feels similar.  Her symptoms have been present for several days and not improving.  No recent antibiotics.  No diarrhea.  No upper abdominal pain.   Past Medical History:  Diagnosis Date  . Anxiety   . Arthritis   . Back pain   . Bipolar disorder ([place])   . Chronic headaches   . Dementia ([place])   . Depression   . Diabetes mellitus without complication ([place])   . GERD (gastroesophageal reflux disease)   . Hypertension    No meds prescribed; states intermittent  . Neuromuscular disorder ([place])   . Shortness of breath dyspnea   . Stroke [facility])    caused numbness in leg    Patient Active Problem List   Diagnosis Date Noted  . Acute pain of right shoulder 03/03/2018  . Muscle spasm 03/03/2018  . Frequent PVCs 12/29/2017  . History of fall 10/23/2017  . Depression   . Anxiety   . Abdominal pain, epigastric 03/30/2015  . Dysphagia 03/30/2015  . History of lumbar laminectomy for spinal cord decompression 02/08/2015  . Hyperlipidemia 04/20/2014  . Chronic midline low back pain without sciatica 03/01/2013  . Numbness in right leg 03/01/2013  . Chest pain on exertion 03/01/2013  . Hypertension   . Stroke [facility])     Past Surgical History:  Procedure Laterality Date  . ABDOMINAL HYSTERECTOMY     no cervix  . COLONOSCOPY WITH PROPOFOL N/A 04/18/2015   EGB:TDVVOHYW hemorrhoids/diverticulosis sigmoid colon/  .  ESOPHAGOGASTRODUODENOSCOPY (EGD) WITH PROPOFOL N/A 04/18/2015   SLF:web in the proximal esophagus/dilated/  . GANGLION CYST EXCISION    . LUMBAR LAMINECTOMY/DECOMPRESSION MICRODISCECTOMY N/A 02/08/2015   Procedure: L4-5 Decompression;  Surgeon: Marybelle Killings, MD;  Location: [facility];  Service: Orthopedics;  Laterality: N/A;  . POLYPECTOMY  04/18/2015   Procedure: POLYPECTOMY;  Surgeon: Danie Binder, MD;  Location[facility];  Service: Endoscopy;;  ascending colon polyp  . TONSILLECTOMY       OB History   No obstetric history on file.      Home Medications    Prior to Admission medications   Medication Sig Start Date End Date Taking? Authorizing Provider  amLODipine (NORVASC) 10 MG tablet Take 1 tablet (10 mg total) by mouth daily. 03/03/18   Lesleigh Noe, MD  cephALEXin (KEFLEX) 500 MG capsule Take 1 capsule (500 mg total) by mouth 2 (two) times daily for 7 days. 04/20/18 04/27/18  Jola Schmidt, MD  cyclobenzaprine (FLEXERIL) 5 MG tablet Take 1 tablet (5 mg total) by mouth daily as needed for muscle spasms. 03/03/18   Lesleigh Noe, MD  DULoxetine (CYMBALTA) 60 MG capsule Take 1 capsule (60 mg total) by mouth daily. 02/26/18   Arfeen, Arlyce Harman, MD  gabapentin (NEURONTIN) 300 MG capsule Take 1 capsule (300 mg total) by mouth 3 (three) times daily. 03/03/18   Lesleigh Noe, MD  lisinopril-hydrochlorothiazide (PRINZIDE,ZESTORETIC) 20-25 MG tablet Take 1 tablet by mouth daily. 12/29/17   Ladell Pier, MD  metoprolol succinate (TOPROL XL) 25 MG 24 hr tablet Take 1 tablet (25 mg total) by mouth daily. 11/28/17   Fay Records, MD  naproxen (NAPROSYN) 500 MG tablet Take 1 tablet (500 mg total) by mouth daily as needed for moderate pain. With food 07/18/17   Ladell Pier, MD  rosuvastatin (CRESTOR) 40 MG tablet Take 40 mg by mouth daily.    [provider]  traZODone (DESYREL) 100 MG tablet Take 1 tablet (100 mg total) by mouth at bedtime. 02/26/18   Arfeen, Arlyce Harman, MD   ziprasidone (GEODON) 80 MG capsule Take 1 capsule (80 mg total) by mouth daily. Take with food 02/26/18   Arfeen, Arlyce Harman, MD    Family History Family History  Problem Relation Age of Onset  . Diabetes Mother   . Hypertension Mother   . Heart disease Other        No family history  . Breast cancer Sister        diagnosed in her 65's  . Anxiety disorder Sister   . Depression Sister   . Anxiety disorder Brother   . Depression Brother   . Anxiety disorder Brother   . Depression Brother   . Cancer Maternal Grandmother        unknown type  . Colon cancer Neg Hx     Social History Social History   Tobacco Use  . Smoking status: Former Smoker    Packs/day: 0.50    Years: 10.00    Pack years: 5.00    Types: Cigarettes    Last attempt to quit: 03/29/1985    Years since quitting: 33.0  . Smokeless tobacco: Never Used  Substance Use Topics  . Alcohol use: No    Alcohol/week: 0.0 standard drinks    Comment: Used to drink heavily, no ETOH in "30-some years"  . Drug use: No    Comment: History of crack, "no drugs in 30-some years"     Allergies   Codeine; Oxycodone; Prednisone; and Tramadol   Review of Systems Review of Systems  All other systems reviewed and are negative.    Physical Exam Updated Vital Signs BP (!) 149/97 (BP Location: Right Arm)   Pulse 78   Temp 97.8 F (36.6 C) (Oral)   Resp 20   Ht 5\' 6"  (1.676 m)   Wt 104.3 kg   SpO2 100%   BMI 37.12 kg/m   Physical Exam Vitals signs and nursing note reviewed.  Constitutional:      General: She is not in acute distress.    Appearance: She is well-developed.  HENT:     Head: Normocephalic and atraumatic.  Neck:     Musculoskeletal: Normal range of motion.  Cardiovascular:     Rate and Rhythm: Normal rate and regular rhythm.     Heart sounds: Normal heart sounds.  Pulmonary:     Effort: Pulmonary effort is normal.     Breath sounds: Normal breath sounds.  Abdominal:     General: There is no  distension.     Palpations: Abdomen is soft.     Tenderness: There is no abdominal tenderness.  Musculoskeletal: Normal range of motion.  Skin:    General: Skin is warm and dry.  Neurological:     Mental Status: She is alert and oriented to person, place, and time.  Psychiatric:  Judgment: Judgment normal.      ED Treatments / Results  Labs (all labs ordered are listed, but only abnormal results are displayed) Labs Reviewed  URINALYSIS, ROUTINE W REFLEX MICROSCOPIC - Abnormal; Notable for the following components:      Result Value   Color, Urine RED (*)    APPearance CLOUDY (*)    Specific Gravity, Urine <1.005 (*)    Hgb urine dipstick LARGE (*)    Protein, ur 30 (*)    Leukocytes,Ua SMALL (*)    All other components within normal limits  URINALYSIS, MICROSCOPIC (REFLEX) - Abnormal; Notable for the following components:   Bacteria, UA RARE (*)    All other components within normal limits  URINE CULTURE    EKG None  Radiology No results found.  Procedures Procedures (including critical care time)  Medications Ordered in ED Medications  cephALEXin (KEFLEX) capsule 500 mg (500 mg Oral Given 04/20/18 0744)     Initial Impression / Assessment and Plan / ED Course  I have reviewed the triage vital signs and the nursing notes.  Pertinent labs & imaging results that were available during my care of the patient were reviewed by me and considered in my medical decision making (see chart for details).        Overall well-appearing.  Suspect acute cystitis with associated hematuria.  Doubt infected stone.  No unilateral flank pain.  Vital signs are stable.  Urine culture sent.  Home with Keflex.  Ibuprofen and Tylenol for symptom control.  Patient understands to return to the emergency department for new or worsening symptoms.  Final Clinical Impressions(s) / ED Diagnoses   Final diagnoses:  Acute cystitis with hematuria    ED Discharge Orders          Ordered    cephALEXin (KEFLEX) 500 MG capsule  2 times daily     04/20/18 0751           Jola Schmidt, MD 04/20/18 [phone]

## 2018-04-21 LAB — URINE CULTURE: Culture: NO GROWTH

## 2018-04-30 ENCOUNTER — Telehealth: Payer: Self-pay

## 2018-04-30 ENCOUNTER — Other Ambulatory Visit (HOSPITAL_COMMUNITY): Payer: Self-pay

## 2018-04-30 DIAGNOSIS — M545 Low back pain: Secondary | ICD-10-CM

## 2018-04-30 DIAGNOSIS — F411 Generalized anxiety disorder: Secondary | ICD-10-CM

## 2018-04-30 DIAGNOSIS — G8929 Other chronic pain: Secondary | ICD-10-CM

## 2018-04-30 DIAGNOSIS — F319 Bipolar disorder, unspecified: Secondary | ICD-10-CM

## 2018-04-30 DIAGNOSIS — M62838 Other muscle spasm: Secondary | ICD-10-CM

## 2018-04-30 MED ORDER — ZIPRASIDONE HCL 80 MG PO CAPS: 80.0000 mg | ORAL_CAPSULE | Freq: Every day | ORAL | 0 refills | Status: DC

## 2018-04-30 MED ORDER — DULOXETINE HCL 60 MG PO CPEP: 60.0000 mg | ORAL_CAPSULE | Freq: Every day | ORAL | 0 refills | Status: DC

## 2018-04-30 MED ORDER — TRAZODONE HCL 100 MG PO TABS: 100.0000 mg | ORAL_TABLET | Freq: Every day | ORAL | 0 refills | Status: DC

## 2018-04-30 MED ORDER — CYCLOBENZAPRINE HCL 5 MG PO TABS: 5.0000 mg | ORAL_TABLET | Freq: Every day | ORAL | 0 refills | Status: DC | PRN

## 2018-04-30 NOTE — Telephone Encounter (Signed)
Urine culture did not show infection. Would recommend appointment for repeat urine and consideration for pelvic exam w/ testing for possible yeast infection or other diagnosis.   Flexeril refilled

## 2018-04-30 NOTE — Telephone Encounter (Signed)
Received IBC from patient requesting a refill for Flexeril and Cephalexin.   Recently seen in ED on 04/20/18 with dx of acute cystitis with hematuria. Reports continued concern with hematuria and itching in vagina. Denies vaginal discharge.   Patient can be reached at [phone].

## 2018-05-01 ENCOUNTER — Ambulatory Visit: Payer: Self-pay

## 2018-05-01 NOTE — Telephone Encounter (Signed)
Patient advised. Patient will see how she does over the weekend and will call us back if she needs an appointment.

## 2018-05-04 ENCOUNTER — Ambulatory Visit: Payer: Self-pay | Admitting: Family Medicine

## 2018-05-04 ENCOUNTER — Ambulatory Visit: Payer: Self-pay | Admitting: Nurse Practitioner

## 2018-05-11 MED FILL — traZODone HCL 100 MG TABS: 100 | 30 days supply | Qty: 30 | Fill #0

## 2018-05-11 MED FILL — CYCLOBENZAPRINE 5 MG TABLET: 5 | 30 days supply | Qty: 30 | Fill #0

## 2018-05-11 MED FILL — GABAPENTIN 300 MG CAPSULE: 300 | 30 days supply | Qty: 90 | Fill #3

## 2018-05-11 MED FILL — ZIPRASIDONE HCL 80 MG CAP: 80 | 30 days supply | Qty: 30 | Fill #0

## 2018-05-11 MED FILL — DULoxetine HCL 60 MG CPEP: 60 | 30 days supply | Qty: 30 | Fill #0

## 2018-05-28 ENCOUNTER — Encounter (HOSPITAL_COMMUNITY): Payer: Self-pay | Admitting: Psychiatry

## 2018-05-28 ENCOUNTER — Ambulatory Visit (INDEPENDENT_AMBULATORY_CARE_PROVIDER_SITE_OTHER): Payer: Medicare Other | Admitting: Psychiatry

## 2018-05-28 ENCOUNTER — Other Ambulatory Visit: Payer: Self-pay

## 2018-05-28 DIAGNOSIS — F319 Bipolar disorder, unspecified: Secondary | ICD-10-CM

## 2018-05-28 DIAGNOSIS — F411 Generalized anxiety disorder: Secondary | ICD-10-CM

## 2018-05-28 MED ORDER — TRAZODONE HCL 150 MG PO TABS: 150.0000 mg | ORAL_TABLET | Freq: Every day | ORAL | 0 refills | Status: DC

## 2018-05-28 MED ORDER — ZIPRASIDONE HCL 80 MG PO CAPS: 80.0000 mg | ORAL_CAPSULE | Freq: Every day | ORAL | 0 refills | Status: DC

## 2018-05-28 MED ORDER — DULOXETINE HCL 60 MG PO CPEP: 60.0000 mg | ORAL_CAPSULE | Freq: Every day | ORAL | 0 refills | Status: DC

## 2018-05-28 NOTE — Progress Notes (Signed)
Virtual Visit via Telephone Note  I connected with Lorraine Ryan on 05/28/18 at 10:00 AM EDT by telephone and verified that I am speaking with the correct person using two identifiers.   I discussed the limitations, risks, security and privacy concerns of performing an evaluation and management service by telephone and the availability of in person appointments. I also discussed with the patient that there may be a patient responsible charge related to this service. The patient expressed understanding and agreed to proceed.   History of Present Illness: Patient was evaluated through phone session.  On her last visit in January we increased Geodon from 60 mg to 80 mg as she was complaining of paranoia, hallucination and irritability.  She has a diagnosis of bipolar disorder and used to see at [facility] but did not like there.  We have requested records from [facility] but have not received any documentation.  She still have residual paranoia and sometimes does not sleep well.  Currently she is living with her son but hoping to have her own place.  She admitted having visual hallucinations as seeing some time images and insects but denies any agitation, anger, aggression.  She reported her mood is up-and-down but feels the medicine working.  Recently she was seen in the emergency room because of abdominal pain with suspicious of UTI.  She was given antibiotic.  Her biggest challenge is to find a stable house where she can live and she is working on it but due to current pandemic coronavirus thing is not moving.  She reported no tremors, shakes or any EPS.  She admitted most of the time medicine working very well but wondering if sleep medicine can increase so she can have a better sleep.  She also complaining of vision impairment and she supposed to see eye physician but due to pandemic coronavirus her appointment is rescheduled.  Patient has chronic headaches, diabetes, GERD, hypertension and chronic pain.  She  reported her appetite is okay.  Her energy level is fair.   Past Psychiatric History: Reviewed. No history of psychiatric inpatient treatment or any suicidal attempt.  History of paranoia, hallucination, mania, anger and drug use.  Seen in the past at [facility] and then [facility] and prescribe Latuda which made her sick.  Tried Abilify but do not remember why it was switched to Geodon.  No history of abuse, no history of panic attacks.    Recent Results (from the past 2160 hour(s))  Urinalysis, Routine w reflex microscopic     Status: Abnormal   Collection Time: 04/20/18  6:48 AM  Result Value Ref Range   Color, Urine RED (A) YELLOW    Comment: BIOCHEMICALS MAY BE AFFECTED BY COLOR   APPearance CLOUDY (A) CLEAR   Specific Gravity, Urine <1.005 (L) 1.005 - 1.030   pH 7.0 5.0 - 8.0   Glucose, UA NEGATIVE NEGATIVE mg/dL   Hgb urine dipstick LARGE (A) NEGATIVE   Bilirubin Urine NEGATIVE NEGATIVE   Ketones, ur NEGATIVE NEGATIVE mg/dL   Protein, ur 30 (A) NEGATIVE mg/dL   Nitrite NEGATIVE NEGATIVE   Leukocytes,Ua SMALL (A) NEGATIVE    Comment: Performed at [facility], [address] [place], [place] [address]  Urinalysis, Microscopic (reflex)     Status: Abnormal   Collection Time: 04/20/18  6:48 AM  Result Value Ref Range   RBC / HPF >50 0 - 5 RBC/hpf   WBC, UA 11-20 0 - 5 WBC/hpf   Bacteria, UA RARE (A)  NONE SEEN   Squamous Epithelial / LPF 0-5 0 - 5    Comment: Performed at [facility], [address] [place], [place] [address]  Urine culture     Status: None   Collection Time: 04/20/18  6:48 AM  Result Value Ref Range   Specimen Description      URINE, CLEAN CATCH Performed at [facility], [address] [place], [place] [address]    Special Requests      NONE Performed at [facility], [address] [place], [place] [address]    Culture      NO GROWTH Performed at [facility], [place] [address], [place], [place]  [address]    Report Status 04/21/2018 FINAL     Observations/Objective: Mental status examination done on the phone.  Patient described her mood sad and disappointed as she could not get her own place.  Her speech is slow and at times distracted.  Her thought process circumstantial.  She admitted auditory and visual hallucination at seeing people and images but denies any command hallucinations.  She denies any suicidal thoughts or homicidal thought.  Her attention concentration is distracted.  She admitted paranoia but no grandiosity or agitation.  She is alert and oriented x3.  Her fund of knowledge is below average.  Her cognition is fair.  Her insight judgment is fair.  She reported no tremors, shakes or any EPS.  Assessment and Plan: Bipolar disorder type I.  Generalized anxiety disorder.  Rule out schizophrenia chronic paranoid type.  I review her current medication, recent visit to the emergency room and labs.  We are still awaiting records from [facility].  Recommend to try trazodone 150 mg at bedtime to help insomnia.  She is tolerating Geodon 80 mg at bedtime and reported no side effects.  Continue Cymbalta 60 mg daily.  I encourage to keep trying to get appointment to see eye physician for visual impairment.  At this time patient is not interested in therapy.  Recommended to call us back if she has any question or any concern.  Follow-up in 3 months.  [facility]  Follow Up Instructions:    I discussed the assessment and treatment plan with the patient. The patient was provided an opportunity to ask questions and all were answered. The patient agreed with the plan and demonstrated an understanding of the instructions.   The patient was advised to call back or seek an in-person evaluation if the symptoms worsen or if the condition fails to improve as anticipated.  I provided 35 minutes of non-face-to-face time during this  encounter.   Kathlee Nations, MD

## 2018-06-01 ENCOUNTER — Other Ambulatory Visit: Payer: Self-pay | Admitting: Family Medicine

## 2018-06-01 ENCOUNTER — Other Ambulatory Visit: Payer: Self-pay | Admitting: Internal Medicine

## 2018-06-01 DIAGNOSIS — M545 Low back pain, unspecified: Secondary | ICD-10-CM

## 2018-06-01 DIAGNOSIS — G8929 Other chronic pain: Secondary | ICD-10-CM

## 2018-06-01 DIAGNOSIS — M62838 Other muscle spasm: Secondary | ICD-10-CM

## 2018-06-01 MED FILL — CYCLOBENZAPRINE 5 MG TABLET: 5 | 30 days supply | Qty: 30 | Fill #0

## 2018-06-01 MED FILL — ZIPRASIDONE HCL 80 MG CAP: 80 | 90 days supply | Qty: 90 | Fill #0

## 2018-06-01 MED FILL — DULoxetine HCL 60 MG CPEP: 60 | 90 days supply | Qty: 90 | Fill #0

## 2018-06-01 MED FILL — traZODone HCL 150 MG TABS: 150 | 90 days supply | Qty: 90 | Fill #0

## 2018-06-01 NOTE — Telephone Encounter (Signed)
Last office visit 03/03/2018 for establish care.  Last refilled 04/30/2018 for #30 with no refills. No future appointments.  Ok to refill?

## 2018-06-05 ENCOUNTER — Other Ambulatory Visit: Payer: Self-pay | Admitting: Internal Medicine

## 2018-06-05 DIAGNOSIS — G8929 Other chronic pain: Secondary | ICD-10-CM

## 2018-06-05 DIAGNOSIS — M545 Low back pain: Principal | ICD-10-CM

## 2018-06-11 ENCOUNTER — Telehealth: Payer: Self-pay

## 2018-06-11 NOTE — Telephone Encounter (Signed)
Left message for patient to call back. Need to schedule virtual visit for f/u on B/P and needs fasting labs prior to the appointment and nurse visit for b/p check.

## 2018-06-12 ENCOUNTER — Other Ambulatory Visit: Payer: Self-pay

## 2018-06-12 ENCOUNTER — Encounter: Payer: Self-pay | Admitting: Nurse Practitioner

## 2018-06-12 ENCOUNTER — Ambulatory Visit: Payer: Medicare Other | Attending: Nurse Practitioner | Admitting: Nurse Practitioner

## 2018-06-12 DIAGNOSIS — F329 Major depressive disorder, single episode, unspecified: Secondary | ICD-10-CM

## 2018-06-12 DIAGNOSIS — F419 Anxiety disorder, unspecified: Secondary | ICD-10-CM

## 2018-06-12 DIAGNOSIS — N950 Postmenopausal bleeding: Secondary | ICD-10-CM

## 2018-06-12 DIAGNOSIS — I10 Essential (primary) hypertension: Secondary | ICD-10-CM | POA: Diagnosis not present

## 2018-06-12 DIAGNOSIS — E559 Vitamin D deficiency, unspecified: Secondary | ICD-10-CM

## 2018-06-12 DIAGNOSIS — Z79899 Other long term (current) drug therapy: Secondary | ICD-10-CM

## 2018-06-12 DIAGNOSIS — G8929 Other chronic pain: Secondary | ICD-10-CM

## 2018-06-12 MED ORDER — NAPROXEN 500 MG PO TABS: 500.0000 mg | ORAL_TABLET | Freq: Every day | ORAL | 2 refills | Status: DC | PRN

## 2018-06-12 MED ORDER — AMLODIPINE BESYLATE 10 MG PO TABS: 10.0000 mg | ORAL_TABLET | Freq: Every day | ORAL | 0 refills | Status: DC

## 2018-06-12 MED FILL — NAPROXEN 500 MG TABLET: 500 | 60 days supply | Qty: 60 | Fill #0

## 2018-06-12 MED FILL — AMLODIPINE BESYLATE 10 MG T: 10 | 90 days supply | Qty: 90 | Fill #0

## 2018-06-12 NOTE — Progress Notes (Signed)
Virtual Visit via Telephone Note Due to national recommendations of social distancing due to [place] 19, telehealth visit is felt to be most appropriate for this patient at this time.  I discussed the limitations, risks, security and privacy concerns of performing an evaluation and management service by telephone and the availability of in person appointments. I also discussed with the patient that there may be a patient responsible charge related to this service. The patient expressed understanding and agreed to proceed.    I connected with Lorraine Ryan on 06/12/18  at  10:30 AM EDT  EDT by telephone and verified that I am speaking with the correct person using two identifiers.   Consent I discussed the limitations, risks, security and privacy concerns of performing an evaluation and management service by telephone and the availability of in person appointments. I also discussed with the patient that there may be a patient responsible charge related to this service. The patient expressed understanding and agreed to proceed.   Location of Patient: Private Residence   Location of Provider: [place] and [place] participating in Telemedicine visit: [name] FNP-BC [place]    History of Present Illness: Telemedicine visit for: Establish Care PMH is as follows: Past Medical History:  Diagnosis Date  . Anxiety   . Arthritis   . Back pain   . Bipolar disorder ([place])   . Chronic headaches   . Dementia ([place])   . Depression   . Diabetes mellitus without complication ([place])   . GERD (gastroesophageal reflux disease)   . Hypertension    No meds prescribed; states intermittent  . Neuromuscular disorder ([place])   . Shortness of breath dyspnea   . Stroke [facility])    caused numbness in leg    Anxiety and Depression/Bipolar disorder Chronic and well controlled. Current medications Every quarter seeing a therapist for anxiety and  Depression. Current medications include duloxetine 60 mg daily, gabapentin 300 mg TID. She states she is currently not taking Geodon.  Depression screen [facility] 2/9 06/12/2018 06/10/2017 02/29/2016 02/12/2016 01/12/2016  Decreased Interest _0 0  Down, Depressed, Hopeless _1 PHQ - 2 Score _2 Altered sleeping 0 1 0 2 2  Tired, decreased energy _3 Change in appetite 1 0 0 3 3  Feeling bad or failure about yourself  0 0 0 0 0  Trouble concentrating _4 0  Moving slowly or fidgety/restless _5 Suicidal thoughts 0 0 0 0 0  PHQ-9 Score _6 GAD 7 : Generalized Anxiety Score 06/12/2018 10/23/2017 06/10/2017 01/29/2017  Nervous, Anxious, on Edge 3 (No Data) 3 (No Data)  Control/stop worrying 0 (No Data) 3 (No Data)  Worry too much - different things 0 (No Data) (No Data) (No Data)  Trouble relaxing 1 (No Data) 2 (No Data)  Restless 0 (No Data) 3 (No Data)  Easily annoyed or irritable 2 (No Data) 2 (No Data)  Afraid - awful might happen 0 (No Data) 0 (No Data)  Total GAD 7 Score 6 - - -     Essential Hypertension Monitors her blood pressure at home. "Sometimes it's normal and sometimes it's high".  Highs 150/98. She does not take her medications at the same time every day. She denies chest pain, shortness of breath, palpitations, lightheadedness, dizziness, headaches or  BLE edema.  Current medications include metoprolol xl 25 mg daily, lisinopril-hctz 20-25 mg daily, amlodipine 28m daily.  BP Readings from Last 3 Encounters:  04/20/18 (!) 149/97  03/03/18 (!) 144/88  01/07/18 (!) 139/95     Chronic Pain She has chronic pain in her pelvis, neck, back and shoulders. States she sometimes experiences vaginal bleeding. Last episode was 04-2018. She had a hysterectomy many years ago. Will order pelvic [place] she also endorses intermittent swelling in her pelvis.  States current pain medications including cymbalta, gabapentin and naproxen do not help to relieve  her pain.  She was seeing Ortho Dr. YLorin Mercyfor right shoulder pain and cervical spondylosis and was referred for Physical therapy. His recommendations were if therapies were ineffective he would consider diagnostic MRI to rule out compression. She did not follow through with therapies. She is requesting "stronger" pain medications today. She has a history of lumbar decompression (2017) and shoulder injections in the past. She has had physical therapy many years ago but states it made her pain worse so she does not wish to repeat. May need tramadol or tylenol #3. But will need UDS first.      Past Surgical History:  Procedure Laterality Date  . ABDOMINAL HYSTERECTOMY     no cervix  . COLONOSCOPY WITH PROPOFOL N/A 04/18/2015   [id number]/diverticulosis sigmoid colon/  . ESOPHAGOGASTRODUODENOSCOPY (EGD) WITH PROPOFOL N/A 04/18/2015   SLF:web in the proximal esophagus/dilated/  . GANGLION CYST EXCISION    . LUMBAR LAMINECTOMY/DECOMPRESSION MICRODISCECTOMY N/A 02/08/2015   Procedure: L4-5 Decompression;  Surgeon: MMarybelle Killings MD;  Location: [facility]  Service: Orthopedics;  Laterality: N/A;  . POLYPECTOMY  04/18/2015   Procedure: POLYPECTOMY;  Surgeon: SDanie Binder MD;  [facility];  Service: Endoscopy;;  ascending colon polyp  . TONSILLECTOMY      Family History  Problem Relation Age of Onset  . Diabetes Mother   . Hypertension Mother   . Heart disease Other        No family history  . Breast cancer Sister        diagnosed in her 419's . Anxiety disorder Sister   . Depression Sister   . Anxiety disorder Brother   . Depression Brother   . Anxiety disorder Brother   . Depression Brother   . Cancer Maternal Grandmother        unknown type  . Colon cancer Neg Hx     Social History   Socioeconomic History  . Marital status: Divorced    Spouse name: Not on file  . Number of children: 5  . Years of education: Not on file  . Highest education level: Associate  degree: occupational, tHotel manager or vocational program  Occupational History  . Not on file  Social Needs  . Financial resource strain: Very hard  . Food insecurity:    Worry: Sometimes true    Inability: Sometimes true  . Transportation needs:    Medical: No    Non-medical: No  Tobacco Use  . Smoking status: Former Smoker    Packs/day: 0.50    Years: 10.00    Pack years: 5.00    Types: Cigarettes    Last attempt to quit: 03/29/1985    Years since quitting: 33.2  . Smokeless tobacco: Never Used  Substance and Sexual Activity  . Alcohol use: No    Alcohol/week: 0.0 standard drinks    Comment: Used to drink heavily, no ETOH in "30-some years"  .  Drug use: No    Comment: History of crack, "no drugs in 30-some years"  . Sexual activity: Not Currently  Lifestyle  . Physical activity:    Days per week: 1 day    Minutes per session: 60 min  . Stress: Very much  Relationships  . Social connections:    Talks on phone: Not on file    Gets together: Not on file    Attends religious service: Never    Active member of club or organization: Yes    Attends meetings of clubs or organizations: More than 4 times per year    Relationship status: Divorced  Other Topics Concern  . Not on file  Social History Narrative   Lives with Son Lorraine Ryan - most of the time   Has a good relationship with Lorraine Ryan   Has 5 children total - 2 are in [place] and the other 2 live farther away and she does not speak with them often   Exercise: gets in the pool    Diet: eats a lot of fast food     Observations/Objective: Awake, alert and oriented x 3   Review of Systems  Constitutional: Negative for fever, malaise/fatigue and weight loss.  HENT: Negative.  Negative for nosebleeds.   Eyes: Negative.  Negative for blurred vision, double vision and photophobia.  Respiratory: Negative.  Negative for cough and shortness of breath.   Cardiovascular: Negative.  Negative for chest pain, palpitations and leg  swelling.  Gastrointestinal: Negative.  Negative for heartburn, nausea and vomiting.  Genitourinary:       SEE HPI  Musculoskeletal: Positive for back pain, joint pain, myalgias and neck pain. Negative for falls.       SEE HPI  Neurological: Negative.  Negative for dizziness, focal weakness, seizures and headaches.  Psychiatric/Behavioral: Positive for depression and hallucinations (auditory). Negative for substance abuse (previous crack cocaine ; >30 years ago) and suicidal ideas. The patient is nervous/anxious.     Assessment and Plan: Diagnoses and all orders for this visit:  Essential hypertension -     amLODipine (NORVASC) 10 MG tablet; Take 1 tablet (10 mg total) by mouth daily. -     CBC; Future -     CMP14+EGFR; Future -     Lipid panel; Future Continue all antihypertensives as prescribed.  Remember to bring in your blood pressure log with you for your follow up appointment.  DASH/Mediterranean Diets are healthier choices for HTN.    Anxiety and depression Continue follow up with therapist as instructed.   Chronic right shoulder pain -     Ambulatory referral to Orthopedic Surgery -     naproxen (NAPROSYN) 500 MG tablet; Take 1 tablet (500 mg total) by mouth daily as needed for moderate pain. With food  May alternate with heat and ice application for pain relief. May also alternate with acetaminophen as prescribed pain relief. Other alternatives include massage, acupuncture and water aerobics.  You must stay active and avoid a sedentary lifestyle.   Post-menopausal bleeding -     US PELVIC COMPLETE WITH TRANSVAGINAL; Future -     Urinalysis, Complete; Future  Encounter for drug therapy -     Drug Screen 12+Alcohol+CRT, Ur; Future  Vitamin D deficiency -     VITAMIN D 25 Hydroxy (Vit-D Deficiency, Fractures); Future     Follow Up Instructions Return in about 3 months (around 09/12/2018).     I discussed the assessment and treatment plan with the patient. The  patient was provided an opportunity to ask questions and all were answered. The patient agreed with the plan and demonstrated an understanding of the instructions.   The patient was advised to call back or seek an in-person evaluation if the symptoms worsen or if the condition fails to improve as anticipated.  I provided 25 minutes of non-face-to-face time during this encounter including median intraservice time, reviewing previous notes, labs, imaging, medications and explaining diagnosis and management.  Gildardo Pounds, FNP-BC

## 2018-06-15 ENCOUNTER — Telehealth: Payer: Self-pay

## 2018-06-15 ENCOUNTER — Other Ambulatory Visit: Payer: Self-pay

## 2018-06-15 ENCOUNTER — Ambulatory Visit: Payer: Medicare Other | Attending: Family Medicine

## 2018-06-15 DIAGNOSIS — E559 Vitamin D deficiency, unspecified: Secondary | ICD-10-CM | POA: Diagnosis not present

## 2018-06-15 DIAGNOSIS — Z79899 Other long term (current) drug therapy: Secondary | ICD-10-CM

## 2018-06-15 DIAGNOSIS — N950 Postmenopausal bleeding: Secondary | ICD-10-CM

## 2018-06-15 DIAGNOSIS — I10 Essential (primary) hypertension: Secondary | ICD-10-CM | POA: Diagnosis not present

## 2018-06-15 MED FILL — GABAPENTIN 300 MG CAPSULE: 300 | 30 days supply | Qty: 90 | Fill #0

## 2018-06-15 NOTE — Telephone Encounter (Signed)
See other message. Patient no longer comes here.

## 2018-06-15 NOTE — Telephone Encounter (Signed)
Received note from [facility] where patient saw NP to establish care. Called patient x 2 and left message to confirm. Looked at appt desk and on 04/24/2018 patient cancelled appointment with Dr. Einar Pheasant and note said "pt stated she don't need this appt she is going back to her other doctor." Removing Dr. Einar Pheasant as patient's PCP. Have not been able to get in touch with patient.

## 2018-06-16 ENCOUNTER — Telehealth: Payer: Self-pay | Admitting: Internal Medicine

## 2018-06-16 LAB — CMP14+EGFR
ALT: 27 IU/L (ref 0–32)
AST: 30 IU/L (ref 0–40)
Albumin/Globulin Ratio: 1.6 (ref 1.2–2.2)
Albumin: 4.3 g/dL (ref 3.8–4.8)
Alkaline Phosphatase: 102 IU/L (ref 39–117)
BUN/Creatinine Ratio: 8 — ABNORMAL LOW (ref 12–28)
BUN: 6 mg/dL — ABNORMAL LOW (ref 8–27)
Bilirubin Total: 0.4 mg/dL (ref 0.0–1.2)
CO2: 24 mmol/L (ref 20–29)
Calcium: 9.9 mg/dL (ref 8.7–10.3)
Chloride: 104 mmol/L (ref 96–106)
Creatinine, Ser: 0.71 mg/dL (ref 0.57–1.00)
GFR calc Af Amer: 105 mL/min/{1.73_m2} (ref >59)
GFR calc non Af Amer: 91 mL/min/{1.73_m2} (ref >59)
Globulin, Total: 2.7 g/dL (ref 1.5–4.5)
Glucose: 91 mg/dL (ref 65–99)
Potassium: 4.2 mmol/L (ref 3.5–5.2)
Sodium: 144 mmol/L (ref 134–144)
Total Protein: 7 g/dL (ref 6.0–8.5)

## 2018-06-16 LAB — DRUG SCREEN 12+ALCOHOL+CRT, UR
Amphetamines, Urine: NEGATIVE ng/mL
BENZODIAZ UR QL: NEGATIVE ng/mL
Barbiturate: NEGATIVE ng/mL
Cannabinoids: NEGATIVE ng/mL
Cocaine (Metabolite): NEGATIVE ng/mL
Creatinine, Urine: 90.3 mg/dL (ref 20.0–300.0)
Ethanol, Urine: NEGATIVE %
Meperidine: NEGATIVE ng/mL
Methadone: NEGATIVE ng/mL
OPIATE SCREEN URINE: NEGATIVE ng/mL
Oxycodone/Oxymorphone, Urine: NEGATIVE ng/mL
Phencyclidine: NEGATIVE ng/mL
Propoxyphene: NEGATIVE ng/mL
Tramadol: NEGATIVE ng/mL

## 2018-06-16 LAB — CBC
Hematocrit: 39.8 % (ref 34.0–46.6)
Hemoglobin: 13.6 g/dL (ref 11.1–15.9)
MCH: 29.7 pg (ref 26.6–33.0)
MCHC: 34.2 g/dL (ref 31.5–35.7)
MCV: 87 fL (ref 79–97)
Platelets: 287 10*3/uL (ref 150–450)
RBC: 4.58 x10E6/uL (ref 3.77–5.28)
RDW: 13 % (ref 11.7–15.4)
WBC: 4.6 10*3/uL (ref 3.4–10.8)

## 2018-06-16 LAB — URINALYSIS, COMPLETE
Bilirubin, UA: NEGATIVE
Glucose, UA: NEGATIVE
Ketones, UA: NEGATIVE
Nitrite, UA: NEGATIVE
Protein,UA: NEGATIVE
RBC, UA: NEGATIVE
Specific Gravity, UA: 1.015 (ref 1.005–1.030)
Urobilinogen, Ur: 0.2 mg/dL (ref 0.2–1.0)
pH, UA: 7.5 (ref 5.0–7.5)

## 2018-06-16 LAB — MICROSCOPIC EXAMINATION: Casts: NONE SEEN /lpf

## 2018-06-16 LAB — LIPID PANEL
Chol/HDL Ratio: 2.9 ratio (ref 0.0–4.4)
Cholesterol, Total: 235 mg/dL — ABNORMAL HIGH (ref 100–199)
HDL: 82 mg/dL (ref >39)
LDL Calculated: 128 mg/dL — ABNORMAL HIGH (ref 0–99)
Triglycerides: 127 mg/dL (ref 0–149)
VLDL Cholesterol Cal: 25 mg/dL (ref 5–40)

## 2018-06-16 LAB — VITAMIN D 25 HYDROXY (VIT D DEFICIENCY, FRACTURES): Vit D, 25-Hydroxy: 25.2 ng/mL — ABNORMAL LOW (ref 30.0–100.0)

## 2018-06-16 NOTE — Telephone Encounter (Signed)
Patient called today stating that the arm the phlebotomist took her blood from back in November is swollen.  She said it's not warm to the touch, just hurts a little, but swollen.

## 2018-06-16 NOTE — Telephone Encounter (Signed)
Went to VM, left message to call back.

## 2018-06-17 ENCOUNTER — Other Ambulatory Visit: Payer: Self-pay | Admitting: Nurse Practitioner

## 2018-06-17 ENCOUNTER — Other Ambulatory Visit: Payer: Self-pay

## 2018-06-17 ENCOUNTER — Ambulatory Visit (HOSPITAL_COMMUNITY)
Admission: RE | Admit: 2018-06-17 | Discharge: 2018-06-17 | Disposition: A | Discharge status: Home / Self Care | Payer: Medicare Other | Source: Ambulatory Visit | Attending: Nurse Practitioner | Admitting: Nurse Practitioner

## 2018-06-17 DIAGNOSIS — N950 Postmenopausal bleeding: Secondary | ICD-10-CM

## 2018-06-17 MED ORDER — ACETAMINOPHEN-CODEINE #3 300-30 MG PO TABS: 1.0000 | ORAL_TABLET | Freq: Three times a day (TID) | ORAL | 0 refills | Status: AC | PRN

## 2018-06-17 MED ORDER — ROSUVASTATIN CALCIUM 40 MG PO TABS: 40.0000 mg | ORAL_TABLET | Freq: Every day | ORAL | 2 refills | Status: DC

## 2018-06-18 MED FILL — ROSUVASTATIN CALCIUM 40 MG: 40 | 90 days supply | Qty: 90 | Fill #0

## 2018-06-18 MED FILL — ACETAMINOPHEN/COD #3 TABLET: 300-30 | 7 days supply | Qty: 21 | Fill #0
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
## 2018-06-24 NOTE — Telephone Encounter (Signed)
Left another message for patient to call back if there is anything we can help her with at our office.

## 2018-07-01 ENCOUNTER — Other Ambulatory Visit: Payer: Self-pay

## 2018-07-01 ENCOUNTER — Emergency Department: Payer: Medicare Other

## 2018-07-01 ENCOUNTER — Emergency Department
Admission: EM | Admit: 2018-07-01 | Discharge: 2018-07-01 | Disposition: A | Discharge status: Home / Self Care | Payer: Medicare Other | Attending: Student in an Organized Health Care Education/Training Program | Admitting: Student in an Organized Health Care Education/Training Program

## 2018-07-01 DIAGNOSIS — R51 Headache: Secondary | ICD-10-CM | POA: Diagnosis not present

## 2018-07-01 DIAGNOSIS — J111 Influenza due to unidentified influenza virus with other respiratory manifestations: Secondary | ICD-10-CM | POA: Diagnosis not present

## 2018-07-01 DIAGNOSIS — Z79899 Other long term (current) drug therapy: Secondary | ICD-10-CM | POA: Insufficient documentation

## 2018-07-01 DIAGNOSIS — I10 Essential (primary) hypertension: Secondary | ICD-10-CM | POA: Insufficient documentation

## 2018-07-01 DIAGNOSIS — F0390 Unspecified dementia without behavioral disturbance: Secondary | ICD-10-CM | POA: Insufficient documentation

## 2018-07-01 DIAGNOSIS — Z87891 Personal history of nicotine dependence: Secondary | ICD-10-CM | POA: Diagnosis not present

## 2018-07-01 DIAGNOSIS — Z20828 Contact with and (suspected) exposure to other viral communicable diseases: Secondary | ICD-10-CM | POA: Diagnosis not present

## 2018-07-01 DIAGNOSIS — R6889 Other general symptoms and signs: Secondary | ICD-10-CM

## 2018-07-01 DIAGNOSIS — R05 Cough: Secondary | ICD-10-CM | POA: Diagnosis not present

## 2018-07-01 DIAGNOSIS — R350 Frequency of micturition: Secondary | ICD-10-CM | POA: Diagnosis not present

## 2018-07-01 DIAGNOSIS — E119 Type 2 diabetes mellitus without complications: Secondary | ICD-10-CM | POA: Insufficient documentation

## 2018-07-01 DIAGNOSIS — R1013 Epigastric pain: Secondary | ICD-10-CM | POA: Insufficient documentation

## 2018-07-01 DIAGNOSIS — Z8673 Personal history of transient ischemic attack (TIA), and cerebral infarction without residual deficits: Secondary | ICD-10-CM | POA: Insufficient documentation

## 2018-07-01 DIAGNOSIS — R42 Dizziness and giddiness: Secondary | ICD-10-CM | POA: Insufficient documentation

## 2018-07-01 LAB — CBC WITH DIFFERENTIAL/PLATELET
Abs Immature Granulocytes: 0.02 10*3/uL (ref 0.00–0.07)
Basophils Absolute: 0.1 10*3/uL (ref 0.0–0.1)
Basophils Relative: 1 %
Eosinophils Absolute: 0.1 10*3/uL (ref 0.0–0.5)
Eosinophils Relative: 2 %
HCT: 41.4 % (ref 36.0–46.0)
Hemoglobin: 13.3 g/dL (ref 12.0–15.0)
Immature Granulocytes: 0 %
Lymphocytes Relative: 36 %
Lymphs Abs: 1.8 10*3/uL (ref 0.7–4.0)
MCH: 29.6 pg (ref 26.0–34.0)
MCHC: 32.1 g/dL (ref 30.0–36.0)
MCV: 92 fL (ref 80.0–100.0)
Monocytes Absolute: 0.5 10*3/uL (ref 0.1–1.0)
Monocytes Relative: 10 %
Neutro Abs: 2.6 10*3/uL (ref 1.7–7.7)
Neutrophils Relative %: 51 %
Platelets: 255 10*3/uL (ref 150–400)
RBC: 4.5 MIL/uL (ref 3.87–5.11)
RDW: 13.4 % (ref 11.5–15.5)
WBC: 5.1 10*3/uL (ref 4.0–10.5)
nRBC: 0 % (ref 0.0–0.2)

## 2018-07-01 LAB — COMPREHENSIVE METABOLIC PANEL
ALT: 33 U/L (ref 0–44)
AST: 31 U/L (ref 15–41)
Albumin: 3.8 g/dL (ref 3.5–5.0)
Alkaline Phosphatase: 88 U/L (ref 38–126)
Anion gap: 8 (ref 5–15)
BUN: 12 mg/dL (ref 8–23)
CO2: 26 mmol/L (ref 22–32)
Calcium: 8.9 mg/dL (ref 8.9–10.3)
Chloride: 106 mmol/L (ref 98–111)
Creatinine, Ser: 0.55 mg/dL (ref 0.44–1.00)
GFR calc Af Amer: >60 mL/min (ref >60)
GFR calc non Af Amer: >60 mL/min (ref >60)
Glucose, Bld: 88 mg/dL (ref 70–99)
Potassium: 3.6 mmol/L (ref 3.5–5.1)
Sodium: 140 mmol/L (ref 135–145)
Total Bilirubin: 0.8 mg/dL (ref 0.3–1.2)
Total Protein: 7.4 g/dL (ref 6.5–8.1)

## 2018-07-01 LAB — URINALYSIS, ROUTINE W REFLEX MICROSCOPIC
Bilirubin Urine: NEGATIVE
Glucose, UA: NEGATIVE mg/dL
Ketones, ur: NEGATIVE mg/dL
Leukocytes,Ua: NEGATIVE
Nitrite: NEGATIVE
Protein, ur: NEGATIVE mg/dL
Specific Gravity, Urine: 1.01 (ref 1.005–1.030)
pH: 7 (ref 5.0–8.0)

## 2018-07-01 MED ORDER — ACETAMINOPHEN 500 MG PO TABS
1000.0000 mg | ORAL_TABLET | Freq: Once | ORAL | Status: AC
  Administered 2018-07-01: 1000 mg via ORAL
  Filled 2018-07-01: qty 2

## 2018-07-01 MED ORDER — SODIUM CHLORIDE 0.9 % IV BOLUS
500.0000 mL | Freq: Once | INTRAVENOUS | Status: AC
  Administered 2018-07-01: 500 mL via INTRAVENOUS

## 2018-07-01 NOTE — ED Notes (Signed)
Pt sitting on the side of the bed speaking with this RN with NAD. A&Ox4. Pt also c/o pain in right side of groin along with lower abdominal pain.

## 2018-07-01 NOTE — ED Triage Notes (Signed)
Pt c/o dizziness, cough, HA, abd pain with nausea and one episode of diarrhea since Sunday.

## 2018-07-01 NOTE — ED Notes (Signed)
Pt ambulated to bathroom to collect urine sample.

## 2018-07-01 NOTE — Discharge Instructions (Signed)
You have a viral illness which can have symptoms like muscle aches, fevers, chills, runny nose, cough, sneezing, sore throat, vomiting or diarrhea. One of the viruses that can cause this is SARS- CoV-2, the virus that causes COVID-19, also known as the novel coronavirus. You could also have a different viral infection such as the common cold or flu. Most patients with viral illness including COVID-19 have mild symptoms and recover on their own. Resting, staying hydrated, and sleeping are helpful. Today we think you are well enough to go home and treat your symptoms with oral liquids, and medicine for fevers, cough, and pain.  We generally do not do COVID-19 testing on people with mild symptoms who are being discharged from the Emergency Department or Clinic.   If we did a test for COVID-19 the results will not be available for several days. We will call you with the result. Please DO NOT CONTACT THE EMERGENCY DEPARTMENT OR CLINIC FOR RESULTS OF THIS TEST.   Please follow the precautions below:  Stay home for at least 7 days after your symptoms began OR for 3 days after your fever ends, whichever takes longer. ?  If people live with you they should also stay home and avoid contact with others for 14 days.?   ISOLATION GUIDANCE FOR POSSIBLE COVID Most people with cough and fever have an illness caused by a virus. One is COVID-19. Not all people with these infections are being tested for the virus that causes COVID-19. People who might have COVID but are not being tested should still try to prevent the spread of the infection.  These instructions are modified recommendations from the [place].  People who might have COVID-19 should follow the instructions below until a doctor or health department says they can stop.  Stay home unless you need to see a doctor Stop doing things outside your home except for getting medical care. Do not go to work, school,  or public areas; and do not use public transportation or taxis.  Call ahead before visiting the doctor Before your appointment, call the doctor's office and tell them about your symptoms. This will help them take steps to keep other people from getting infected.   Keep track of your symptoms Symptoms are the things you feel, like fever or trouble breathing. Go to your doctor or the ER if you think you are getting worse, like having more trouble breathing. Call the doctor's office and tell them about your symptoms. This will help them take steps to keep other people from getting sick.   Wear a face mask You should wear a face mask that covers your nose and mouth when you are in the same room with other people and when you visit the doctor's office. People who live with you or visit you should also wear a face mask when they are in the same room with you.  Separate yourself from other people in your home You should stay in a different room from other people in your home. You should stay separate from your family members as much as possible. You should use a separate bathroom if you can.  Avoid sharing things in your house Don't share dishes, drinking glasses, cups, eating utensils, towels, bedding, or other things with people in your home. After using these things please wash them really well with soap and water.  Cover your coughs and sneezes Cover your mouth and nose with a tissue when you  cough or sneeze, or you can cough or sneeze into your sleeve. Throw used tissues in a trash can that has a bag in it, and immediately wash your hands with soap and water for at least 20 seconds. If you use an alcohol-based hand rub please rub your hands together for 20 seconds.  Wash your [facility] your hands often and very well with soap and water for at least 20 seconds. If your hands are not visibly dirty you can use an alcohol-based hand sanitizer. Don't touch your eyes, nose, or mouth with unwashed  hands.    Instructions for People Helping Care for Patients with Possible COVID-19 Follow your doctor's instructions Make sure that you understand and can help the patient follow any instructions for care.  Provide for the patient's basic needs You should help the patient with basic needs in the home and provide support for getting groceries, prescriptions, and other personal needs.  Keep track of the patient's symptoms If they are getting sicker, call his or her doctor. This will help the doctor's office take steps to keep other people from getting infected.  Limit the number of people who have contact with the patient If possible, have only one caregiver for the patient. Other family members should stay in another home or place of residence. If they can't, they should stay in another room and stay separated from the patient as much as possible. Keep one bathroom JUST for the patient if you can.  Only allow visitors if they MUST be in the home.  Keep older adults, very young children, and other sick people away from the patient Keep older adults, very young children, and people who have compromised immune systems or chronic health conditions away from the patient. This includes people with chronic heart, lung, or kidney conditions, diabetes, and cancer.  Wash your hands often Avoid touching your eyes, nose, and mouth with unwashed hands. Wash your hands often and thoroughly with soap and water for at least 20 seconds. You can use an alcohol-based hand sanitizer if soap and water are not available and if your hands are not visibly dirty.  Use disposable paper towels to dry your hands. If not available, use dedicated cloth towels and replace them when they become wet.  Avoid contamination from face masks and gloves Wear a disposable face mask and gloves whenever you are touching the patient, things in their room or bathroom, or things that can have their body fluid on them, like bedding  or dishes, or blood, vomit, urine, or feces (poop).  Ensure the mask fits over your nose and mouth tightly, and do not touch it at all while you are wearing it. Throw out disposable facemasks and gloves after using them. Do not reuse. Wash your hands immediately after removing your facemask and gloves. If your personal clothing becomes dirty with a patient's body fluids, carefully remove clothing and launder. Wash your hands after handling dirty clothing. Place all used disposable facemasks, gloves, and other waste in a lined container before disposing them with other household garbage.  Remove gloves and wash your hands immediately after handling these items.  Do not share dishes, glasses, or other household items with the patient Avoid sharing household items. You should not share dishes, drinking glasses, cups, eating utensils, towels, bedding, or other items with a patient who is confirmed to have, or being evaluated for, COVID-19 infection.  After the person uses these items, you should wash them very well with soap and  water.  Wash laundry thoroughly Immediately remove and wash clothes or bedding that have blood, body fluids, and/or secretions or excretions, such as sweat, saliva, sputum, nasal mucus, vomit, urine, or feces, on them.  Wear gloves when handling laundry from the patient.  Read and follow directions on labels of laundry or clothing items and detergent. In general, wash and dry with the warmest temperatures recommended on the label.  Clean all areas the individual has used  Clean all touchable surfaces, such as counters, tabletops, doorknobs, bathroom fixtures, toilets, phones, keyboards, tablets, and bedside tables, every day. Also, clean any surfaces that may have blood, body fluids, and/or secretions or excretions on them. Wear gloves when cleaning surfaces the patient has come in contact with.  Use a diluted bleach solution (dilute bleach with 1 part bleach and 10 parts  water) or a household disinfectant with a label that says EPA-registered for coronaviruses. To make a bleach solution at home, add 1 tablespoon of bleach to 1 quart (4 cups) of water. For a larger supply, add  cup of bleach to 1 gallon (16 cups) of water.  Read labels of cleaning products and follow recommendations provided on product labels. Labels contain instructions for safe and effective use of the cleaning product including precautions you should take when applying the product, such as wearing gloves or eye protection and making sure you have good ventilation during use of the product.  Remove gloves and wash hands immediately after cleaning.  Monitor yourself for signs and symptoms of illness Caregivers and household members are considered close contacts, should monitor their health, and will be asked to limit movement outside of the home as much as possible.   If you have additional questions Nutritional therapist or Healthcare Provider The [facility] website (AssistantPositions.pl) The [place] hotline 336-70COVID Your local health department   This guidance is subject to change. For the most up to date guidance, check the state Department of Health website at [email or web address]

## 2018-07-01 NOTE — ED Provider Notes (Signed)
[facility] Emergency Department Provider Note    First MD Initiated Contact with Patient 07/01/18 [phone]     (approximate)  I have reviewed the triage vital signs and the nursing notes.   HISTORY  Chief Complaint Dizziness and Abdominal Pain    HPI Lorraine Ryan is a 63 y.o. female the below listed past medical history presents with several days of flulike illness.  Her symptoms have included headache cough nausea 1 episode of loose stool.  No measured fevers but feels like she has had some chills.  States that she was recently on antibiotics but cannot remember the name of them.  States that she is having epigastric discomfort with her nausea but has been able to eat and drink.  Feels dizzy and a bit dehydrated.  Denies any dysuria but states that she is been urinating more frequently.  Denies any flank pain.    Past Medical History:  Diagnosis Date  . Anxiety   . Arthritis   . Back pain   . Bipolar disorder ([place])   . Chronic headaches   . Dementia ([place])   . Depression   . Diabetes mellitus without complication ([place])   . GERD (gastroesophageal reflux disease)   . Hypertension    No meds prescribed; states intermittent  . Neuromuscular disorder ([place])   . Shortness of breath dyspnea   . Stroke [facility])    caused numbness in leg   Family History  Problem Relation Age of Onset  . Diabetes Mother   . Hypertension Mother   . Heart disease Other        No family history  . Breast cancer Sister        diagnosed in her 6's  . Anxiety disorder Sister   . Depression Sister   . Anxiety disorder Brother   . Depression Brother   . Anxiety disorder Brother   . Depression Brother   . Cancer Maternal Grandmother        unknown type  . Colon cancer Neg Hx    Past Surgical History:  Procedure Laterality Date  . ABDOMINAL HYSTERECTOMY     no cervix  . COLONOSCOPY WITH PROPOFOL N/A 04/18/2015   TKP:TWSFKCLE hemorrhoids/diverticulosis sigmoid colon/  .  ESOPHAGOGASTRODUODENOSCOPY (EGD) WITH PROPOFOL N/A 04/18/2015   SLF:web in the proximal esophagus/dilated/  . GANGLION CYST EXCISION    . LUMBAR LAMINECTOMY/DECOMPRESSION MICRODISCECTOMY N/A 02/08/2015   Procedure: L4-5 Decompression;  Surgeon: Marybelle Killings, MD;  Location: [place];  Service: Orthopedics;  Laterality: N/A;  . POLYPECTOMY  04/18/2015   Procedure: POLYPECTOMY;  Surgeon: Danie Binder, MD;  [facility];  Service: Endoscopy;;  ascending colon polyp  . TONSILLECTOMY     Patient Active Problem List   Diagnosis Date Noted  . Acute pain of right shoulder 03/03/2018  . Muscle spasm 03/03/2018  . Frequent PVCs 12/29/2017  . History of fall 10/23/2017  . Depression   . Anxiety   . Abdominal pain, epigastric 03/30/2015  . Dysphagia 03/30/2015  . History of lumbar laminectomy for spinal cord decompression 02/08/2015  . Hyperlipidemia 04/20/2014  . Chronic midline low back pain without sciatica 03/01/2013  . Numbness in right leg 03/01/2013  . Chest pain on exertion 03/01/2013  . Hypertension   . Stroke [facility])       Prior to Admission medications   Medication Sig Start Date End Date Taking? Authorizing Provider  acetaminophen-codeine (TYLENOL #3) 300-30 MG tablet Take 1-2 tablets by mouth  every 8 (eight) hours as needed for up to 30 days for moderate pain. 06/17/18 07/17/18  Gildardo Pounds, NP  amLODipine (NORVASC) 10 MG tablet Take 1 tablet (10 mg total) by mouth daily. 06/12/18   Gildardo Pounds, NP  cyclobenzaprine (FLEXERIL) 5 MG tablet TAKE 1 TABLET (5 MG TOTAL) BY MOUTH DAILY AS NEEDED FOR MUSCLE SPASMS. 06/01/18   Lesleigh Noe, MD  DULoxetine (CYMBALTA) 60 MG capsule Take 1 capsule (60 mg total) by mouth daily. 05/28/18   Arfeen, Arlyce Harman, MD  gabapentin (NEURONTIN) 300 MG capsule TAKE 1 CAPSULE (300 MG TOTAL) BY MOUTH 3 (THREE) TIMES DAILY. 06/05/18   Lesleigh Noe, MD  lisinopril-hydrochlorothiazide (PRINZIDE,ZESTORETIC) 20-25 MG tablet Take 1 tablet by mouth  daily. 12/29/17   Ladell Pier, MD  metoprolol succinate (TOPROL XL) 25 MG 24 hr tablet Take 1 tablet (25 mg total) by mouth daily. 11/28/17   Fay Records, MD  naproxen (NAPROSYN) 500 MG tablet Take 1 tablet (500 mg total) by mouth daily as needed for moderate pain. With food 06/12/18   Gildardo Pounds, NP  rosuvastatin (CRESTOR) 40 MG tablet Take 1 tablet (40 mg total) by mouth daily. 06/17/18 09/15/18  Gildardo Pounds, NP  traZODone (DESYREL) 150 MG tablet Take 1 tablet (150 mg total) by mouth at bedtime. 05/28/18   Arfeen, Arlyce Harman, MD  ziprasidone (GEODON) 80 MG capsule Take 1 capsule (80 mg total) by mouth daily. Take with food Patient not taking: Reported on 06/12/2018 05/28/18   Kathlee Nations, MD    Allergies Codeine; Oxycodone; Prednisone; and Tramadol    Social History Social History   Tobacco Use  . Smoking status: Former Smoker    Packs/day: 0.50    Years: 10.00    Pack years: 5.00    Types: Cigarettes    Last attempt to quit: 03/29/1985    Years since quitting: 33.2  . Smokeless tobacco: Never Used  Substance Use Topics  . Alcohol use: No    Alcohol/week: 0.0 standard drinks    Comment: Used to drink heavily, no ETOH in "30-some years"  . Drug use: No    Comment: History of crack, "no drugs in 30-some years"    Review of Systems Patient denies headaches, rhinorrhea, blurry vision, numbness, shortness of breath, chest pain, edema, cough, abdominal pain, nausea, vomiting, diarrhea, dysuria, fevers, rashes or hallucinations unless otherwise stated above in HPI. ____________________________________________   PHYSICAL EXAM:  VITAL SIGNS: Vitals:   07/01/18 0941 07/01/18 0951  BP: (!) 148/103 (!) 129/102  Pulse: 75 68  Resp:  18  Temp:    SpO2: 99% 99%    Constitutional: Alert and oriented.  Eyes: Conjunctivae are normal.  Head: Atraumatic. Nose: No congestion/rhinnorhea. Mouth/Throat: Mucous membranes are moist.   Neck: No stridor. Painless ROM.   Cardiovascular: Normal rate, regular rhythm. Grossly normal heart sounds.  Good peripheral circulation. Respiratory: Normal respiratory effort.  No retractions. Lungs CTAB. Gastrointestinal: Soft and nontender in all four quadrants. No distention. No abdominal bruits. No CVA tenderness. Genitourinary: deferred Musculoskeletal: No lower extremity tenderness nor edema.  No joint effusions. Neurologic:  Normal speech and language. No gross focal neurologic deficits are appreciated. No facial droop Skin:  Skin is warm, dry and intact. No rash noted. Psychiatric: Mood and affect are normal. Speech and behavior are normal.  ____________________________________________   LABS (all labs ordered are listed, but only abnormal results are displayed)  Results for orders placed or performed during  the hospital encounter of 07/01/18 (from the past 24 hour(s))  CBC with Differential/Platelet     Status: None   Collection Time: 07/01/18  8:57 AM  Result Value Ref Range   WBC 5.1 4.0 - 10.5 K/uL   RBC 4.50 3.87 - 5.11 MIL/uL   Hemoglobin 13.3 12.0 - 15.0 g/dL   HCT 41.4 36.0 - 46.0 %   MCV 92.0 80.0 - 100.0 fL   MCH 29.6 26.0 - 34.0 pg   MCHC 32.1 30.0 - 36.0 g/dL   RDW 13.4 11.5 - 15.5 %   Platelets 255 150 - 400 K/uL   nRBC 0.0 0.0 - 0.2 %   Neutrophils Relative % 51 %   Neutro Abs 2.6 1.7 - 7.7 K/uL   Lymphocytes Relative 36 %   Lymphs Abs 1.8 0.7 - 4.0 K/uL   Monocytes Relative 10 %   Monocytes Absolute 0.5 0.1 - 1.0 K/uL   Eosinophils Relative 2 %   Eosinophils Absolute 0.1 0.0 - 0.5 K/uL   Basophils Relative 1 %   Basophils Absolute 0.1 0.0 - 0.1 K/uL   Immature Granulocytes 0 %   Abs Immature Granulocytes 0.02 0.00 - 0.07 K/uL  Comprehensive metabolic panel     Status: None   Collection Time: 07/01/18  8:57 AM  Result Value Ref Range   Sodium 140 135 - 145 mmol/L   Potassium 3.6 3.5 - 5.1 mmol/L   Chloride 106 98 - 111 mmol/L   CO2 26 22 - 32 mmol/L   Glucose, Bld 88 70 - 99  mg/dL   BUN 12 8 - 23 mg/dL   Creatinine, Ser 0.55 0.44 - 1.00 mg/dL   Calcium 8.9 8.9 - 10.3 mg/dL   Total Protein 7.4 6.5 - 8.1 g/dL   Albumin 3.8 3.5 - 5.0 g/dL   AST 31 15 - 41 U/L   ALT 33 0 - 44 U/L   Alkaline Phosphatase 88 38 - 126 U/L   Total Bilirubin 0.8 0.3 - 1.2 mg/dL   GFR calc non Af Amer >60 >60 mL/min   GFR calc Af Amer >60 >60 mL/min   Anion gap 8 5 - 15  Urinalysis, Routine w reflex microscopic     Status: Abnormal   Collection Time: 07/01/18  9:07 AM  Result Value Ref Range   Color, Urine YELLOW (A) YELLOW   APPearance CLEAR (A) CLEAR   Specific Gravity, Urine 1.010 1.005 - 1.030   pH 7.0 5.0 - 8.0   Glucose, UA NEGATIVE NEGATIVE mg/dL   Hgb urine dipstick MODERATE (A) NEGATIVE   Bilirubin Urine NEGATIVE NEGATIVE   Ketones, ur NEGATIVE NEGATIVE mg/dL   Protein, ur NEGATIVE NEGATIVE mg/dL   Nitrite NEGATIVE NEGATIVE   Leukocytes,Ua NEGATIVE NEGATIVE   RBC / HPF 0-5 0 - 5 RBC/hpf   WBC, UA 0-5 0 - 5 WBC/hpf   Bacteria, UA RARE (A) NONE SEEN   Squamous Epithelial / LPF 0-5 0 - 5   ____________________________________________ ____________________________________________  RADIOLOGY  I personally reviewed all radiographic images ordered to evaluate for the above acute complaints and reviewed radiology reports and findings.  These findings were personally discussed with the patient.  Please see medical record for radiology report.  ____________________________________________   PROCEDURES  Procedure(s) performed:  Procedures    Critical Care performed: no ____________________________________________   INITIAL IMPRESSION / ASSESSMENT AND PLAN / ED COURSE  Pertinent labs & imaging results that were available during my care of the patient were reviewed by  me and considered in my medical decision making (see chart for details).   DDX: ili, covid 19, pna, uti, gastritis, enteritis, doubt cholelithiasis or cholecystitis  Aurie Harroun is a 63 y.o.  who presents to the ED with symptoms as described above.  Likely viral illness.  She is afebrile and hemodynamically stable.  Exam is reassuring.  Has a history of chronic migraines and states this 1 is mild.  No new focal neuro deficits.  No indication for CT imaging based on exam.  Will provide IV fluids order chest x-ray given her cough to evaluate for pneumonia or infiltrates.  She is otherwise well-appearing.  Clinical Course as of Jul 01 951  Wed Jul 01, 2018  0951 Patient reassessed and feels improved after IV fluids.  Repeat exam is reassuring with stable vital signs.  Mild hypertension which is known her.  No evidence of pneumonia or infiltrate.  Possible coronavirus.  Twill result in the next day or 2.  We discussed conservative and symptomatic management and signs and symptoms for which she should return to the ER.  Have discussed with the patient and available family all diagnostics and treatments performed thus far and all questions were answered to the best of my ability. The patient demonstrates understanding and agreement with plan.    [PR]    Clinical Course User Index [PR] Merlyn Lot, MD    The patient was evaluated in Emergency Department today for the symptoms described in the history of present illness. He/she was evaluated in the context of the global COVID-19 pandemic, which necessitated consideration that the patient might be at risk for infection with the SARS-CoV-2 virus that causes COVID-19. Institutional protocols and algorithms that pertain to the evaluation of patients at risk for COVID-19 are in a state of rapid change based on information released by regulatory bodies including the CDC and federal and state organizations. These policies and algorithms were followed during the patient's care in the ED.  As part of my medical decision making, I reviewed the following data within the [place] notes reviewed and incorporated, Labs reviewed,  notes from prior ED visits and [place] Controlled Substance Database   ____________________________________________   FINAL CLINICAL IMPRESSION(S) / ED DIAGNOSES  Final diagnoses:  Lightheadedness  Flu-like symptoms      NEW MEDICATIONS STARTED DURING THIS VISIT:  New Prescriptions   No medications on file     Note:  This document was prepared using Dragon voice recognition software and may include unintentional dictation errors.    Merlyn Lot, MD 07/01/18 [phone]

## 2018-07-02 LAB — NOVEL CORONAVIRUS, NAA (HOSP ORDER, SEND-OUT TO REF LAB; TAT 18-24 HRS): SARS-CoV-2, NAA: NOT DETECTED

## 2018-07-06 NOTE — ED Notes (Signed)
Patient called for her covid 39 results.  I gave her the result which is negative.

## 2018-07-16 ENCOUNTER — Other Ambulatory Visit: Payer: Self-pay | Admitting: Nurse Practitioner

## 2018-07-16 DIAGNOSIS — I10 Essential (primary) hypertension: Secondary | ICD-10-CM

## 2018-07-16 DIAGNOSIS — G8929 Other chronic pain: Secondary | ICD-10-CM

## 2018-07-16 MED FILL — METOPROLOL SUCCINATE ER 25: 25 | 90 days supply | Qty: 90 | Fill #2 | Status: TO

## 2018-07-16 MED FILL — GABAPENTIN 300 MG CAPSULE: 300 | 30 days supply | Qty: 90 | Fill #1 | Status: TO

## 2018-07-16 NOTE — Telephone Encounter (Signed)
1) Medication(s) Requested (by name): Gabapentin Lisinopril-hydrochlorothiazide Ziprasidone Amlodipine duloxetine 2) Pharmacy of Choice:  [facility]

## 2018-07-20 NOTE — Telephone Encounter (Signed)
Duloxetine and Ziprasidone come from Dr. Marguerite Olea office, we will not be refilling those prescriptions.

## 2018-07-20 NOTE — Addendum Note (Signed)
Addended by: Frederich Cha on: 07/20/2018 09:03 AM   Modules accepted: Orders

## 2018-07-21 ENCOUNTER — Telehealth: Payer: Self-pay | Admitting: Nurse Practitioner

## 2018-07-21 NOTE — Telephone Encounter (Signed)
[facility] called stating they have faxed over forms to be filled out by PCP for med refill. Pharmacy states they have not received any forms back. Please f/u

## 2018-07-22 MED ORDER — AMLODIPINE BESYLATE 10 MG PO TABS: 10.0000 mg | ORAL_TABLET | Freq: Every day | ORAL | 0 refills | Status: DC

## 2018-07-22 MED ORDER — LISINOPRIL-HYDROCHLOROTHIAZIDE 20-25 MG PO TABS: 1.0000 | ORAL_TABLET | Freq: Every day | ORAL | 0 refills | Status: DC

## 2018-07-22 MED ORDER — GABAPENTIN 300 MG PO CAPS: 300.0000 mg | ORAL_CAPSULE | Freq: Three times a day (TID) | ORAL | 1 refills | Status: DC

## 2018-07-29 NOTE — Telephone Encounter (Signed)
The clinical pharmacist refilled what was requested.

## 2018-08-04 ENCOUNTER — Ambulatory Visit (INDEPENDENT_AMBULATORY_CARE_PROVIDER_SITE_OTHER): Payer: Medicare Other | Admitting: Family Medicine

## 2018-08-04 ENCOUNTER — Encounter: Payer: Self-pay | Admitting: Family Medicine

## 2018-08-04 ENCOUNTER — Other Ambulatory Visit: Payer: Self-pay

## 2018-08-04 VITALS — BP 133/84 | HR 84

## 2018-08-04 DIAGNOSIS — G8929 Other chronic pain: Secondary | ICD-10-CM

## 2018-08-04 DIAGNOSIS — M25511 Pain in right shoulder: Secondary | ICD-10-CM

## 2018-08-04 DIAGNOSIS — R600 Localized edema: Secondary | ICD-10-CM | POA: Diagnosis not present

## 2018-08-04 NOTE — Progress Notes (Signed)
Lorraine Ryan - 63 y.o. female MRN [id number]  Date of birth: 1956/01/03    SUBJECTIVE:      Chief Complaint: Right shoulder pain, right foot pain  HPI:  63 year old female presents with right shoulder pain and right foot pain.  Patient has a history of chronic right shoulder pain and has responded well to steroid injections in the past.  She would like this again today.  She reports several months of relief from this.  She states her pain is similar in nature to her previous pain is worse with reaching behind her.  She denies any new injuries.  She localizes her pain to the lateral shoulder/deltoid area.  Today, she denies any pain rating in the distal arm.  No numbness or tingling in the hand.  She also reports about 2-3 weeks of pain over the dorsal right foot.  She denies any injury.  Additionally, she reports swelling of the foot.  She is not certain what makes the pain worse.  Nothing particular makes it better.  She denies any associated numbness or tingling.  No erythema.   ROS:     See HPI. All other reviewed systems negative.  PERTINENT  PMH / PSH FH / / SH:  Past Medical, Surgical, Social, and Family History Reviewed & Updated in the EMR.  Pertinent findings include:  Of note, patient takes Norvasc  OBJECTIVE: BP 133/84 (BP Location: Left Arm, Patient Position: Sitting, Cuff Size: Large)   Pulse 84   Physical Exam:  Vital signs are reviewed.  GEN: Alert and oriented, NAD Pulm: Lungs clear to auscultation bilaterally.  No wheezes, rales, rhonchi breathing unlabored PSY: blunted affect  MSK: Right shoulder: No obvious deformity or asymmetry. No bruising. No swelling Some mild diffuse tenderness over the lateral shoulder Patient is limited in abduction to about 130 degrees which causes her pain.  She has mild limitation bilaterally in flexion.  Normal internal rotation NV intact distally Special Tests:  - Impingement: Patient reports pain reproduced with both [place]  and Neers.  - Supraspinatus: Mild pain with empty can.  5/5 strength - Infraspinatus/Teres: 5/5 strength with ER - Subscapularis: negative belly press, negative bear hug. 5/5 strength with IR   Right foot: 2+ Pitting edema of the foot extending proximally to the proximal tibia.  No obvious erythema.  No bruising Diffuse tenderness over the dorsum of the foot She has slightly reduced dorsiflexion plantarflexion but this is present bilaterally Normal strength bilaterally N/V intact to light touch  Left foot: 2+ Pitting edema of the foot extending proximally to the proximal tibia.  No obvious erythema.  No bruising No TTP She has slightly reduced dorsiflexion plantarflexion but this is present bilaterally Normal strength bilaterally N/V intact to light touch   ASSESSMENT & PLAN:  1.  Right shoulder pain- secondary to impingement.  She does have a history of cervical radiculopathy, however her pain today seems more localized to the shoulder. -Since she did get good relief previously, subacromial injection was performed today - f/u for this as needed  2. Right foot pain -on exam, her foot pain does not seem to be orthopedic in nature..  She does have new and fairly extensive pitting edema in the bilateral lower extremities up to the proximal tibia.  We reviewed some of her medications and medical history.  Other than currently taking Norvasc, no significant medical history to explain her pitting edema.  Based on chart review, unknown emergency department note from 07/01/2018, no lower extremity edema  is documented on exam.  On questioning, she does note occasional shortness of breath, however this seems to be mostly related to her wearing a surgical mask as it resolves if she removes it from her face. - Recommend patient follow-up with her PCP for further medical evaluation - Message sent to PCP through EMR    Procedure performed: subacromial corticosteroid injection; palpation guided  Consent obtained and verified. Time-out conducted. Noted no overlying erythema, induration, or other signs of local infection. The RIGHT posterior subacromial space was palpated and marked. The overlying skin was prepped in a sterile fashion. Topical analgesic spray: Ethyl chloride. Joint: RIGHT subacromial Needle: 25GA, 1.5" Completed without difficulty. Meds: depomedrol 40mg , lidocaine 5cc

## 2018-08-04 NOTE — Progress Notes (Signed)
I saw and examined the patient with Dr. Okey Dupre and agree with assessment and plan as outlined.    Right shoulder impingement vs. Cervical radicular pain.  Injection today in subacromial space.  Neck MRI if no improvement.  R>L leg edema, etiology uncertain.  Lungs sound clear today.  No change in norvasc dosage recently.  Suggest evaluation by PCP in near future.

## 2018-08-11 ENCOUNTER — Other Ambulatory Visit: Payer: Self-pay

## 2018-08-11 ENCOUNTER — Ambulatory Visit
Admission: RE | Admit: 2018-08-11 | Discharge: 2018-08-11 | Disposition: A | Discharge status: Home / Self Care | Payer: Medicare Other | Source: Ambulatory Visit | Attending: Family Medicine | Admitting: Family Medicine

## 2018-08-11 DIAGNOSIS — Z1231 Encounter for screening mammogram for malignant neoplasm of breast: Secondary | ICD-10-CM | POA: Diagnosis not present

## 2018-08-15 ENCOUNTER — Emergency Department (HOSPITAL_COMMUNITY): Payer: Medicare Other

## 2018-08-15 ENCOUNTER — Other Ambulatory Visit: Payer: Self-pay

## 2018-08-15 ENCOUNTER — Encounter (HOSPITAL_COMMUNITY): Payer: Self-pay | Admitting: *Deleted

## 2018-08-15 ENCOUNTER — Emergency Department (HOSPITAL_COMMUNITY)
Admission: EM | Admit: 2018-08-15 | Discharge: 2018-08-16 | Disposition: A | Discharge status: Home / Self Care | Payer: Medicare Other | Attending: Emergency Medicine | Admitting: Emergency Medicine

## 2018-08-15 DIAGNOSIS — M7989 Other specified soft tissue disorders: Secondary | ICD-10-CM | POA: Diagnosis not present

## 2018-08-15 DIAGNOSIS — R21 Rash and other nonspecific skin eruption: Secondary | ICD-10-CM | POA: Insufficient documentation

## 2018-08-15 DIAGNOSIS — Z8673 Personal history of transient ischemic attack (TIA), and cerebral infarction without residual deficits: Secondary | ICD-10-CM | POA: Insufficient documentation

## 2018-08-15 DIAGNOSIS — Z87891 Personal history of nicotine dependence: Secondary | ICD-10-CM | POA: Diagnosis not present

## 2018-08-15 DIAGNOSIS — L304 Erythema intertrigo: Secondary | ICD-10-CM | POA: Insufficient documentation

## 2018-08-15 DIAGNOSIS — R2243 Localized swelling, mass and lump, lower limb, bilateral: Secondary | ICD-10-CM | POA: Insufficient documentation

## 2018-08-15 DIAGNOSIS — E119 Type 2 diabetes mellitus without complications: Secondary | ICD-10-CM | POA: Insufficient documentation

## 2018-08-15 DIAGNOSIS — I10 Essential (primary) hypertension: Secondary | ICD-10-CM | POA: Insufficient documentation

## 2018-08-15 DIAGNOSIS — Z79899 Other long term (current) drug therapy: Secondary | ICD-10-CM | POA: Insufficient documentation

## 2018-08-15 DIAGNOSIS — R600 Localized edema: Secondary | ICD-10-CM | POA: Diagnosis not present

## 2018-08-15 NOTE — ED Triage Notes (Signed)
Pt noticed bilateral leg swelling for the past several weeks. Also reports rash to groin

## 2018-08-15 NOTE — ED Notes (Signed)
Patient transported to X-ray 

## 2018-08-16 LAB — URINALYSIS, ROUTINE W REFLEX MICROSCOPIC
Bilirubin Urine: NEGATIVE
Glucose, UA: NEGATIVE mg/dL
Ketones, ur: NEGATIVE mg/dL
Nitrite: NEGATIVE
Protein, ur: NEGATIVE mg/dL
Specific Gravity, Urine: 1.006 (ref 1.005–1.030)
pH: 6 (ref 5.0–8.0)

## 2018-08-16 LAB — CBC WITH DIFFERENTIAL/PLATELET
Abs Immature Granulocytes: 0.02 10*3/uL (ref 0.00–0.07)
Basophils Absolute: 0.1 10*3/uL (ref 0.0–0.1)
Basophils Relative: 1 %
Eosinophils Absolute: 0.3 10*3/uL (ref 0.0–0.5)
Eosinophils Relative: 4 %
HCT: 42.4 % (ref 36.0–46.0)
Hemoglobin: 13.6 g/dL (ref 12.0–15.0)
Immature Granulocytes: 0 %
Lymphocytes Relative: 27 %
Lymphs Abs: 1.9 10*3/uL (ref 0.7–4.0)
MCH: 30.2 pg (ref 26.0–34.0)
MCHC: 32.1 g/dL (ref 30.0–36.0)
MCV: 94.2 fL (ref 80.0–100.0)
Monocytes Absolute: 0.7 10*3/uL (ref 0.1–1.0)
Monocytes Relative: 10 %
Neutro Abs: 4.1 10*3/uL (ref 1.7–7.7)
Neutrophils Relative %: 58 %
Platelets: 245 10*3/uL (ref 150–400)
RBC: 4.5 MIL/uL (ref 3.87–5.11)
RDW: 13.3 % (ref 11.5–15.5)
WBC: 7.1 10*3/uL (ref 4.0–10.5)
nRBC: 0 % (ref 0.0–0.2)

## 2018-08-16 LAB — COMPREHENSIVE METABOLIC PANEL
ALT: 22 U/L (ref 0–44)
AST: 20 U/L (ref 15–41)
Albumin: 3.5 g/dL (ref 3.5–5.0)
Alkaline Phosphatase: 86 U/L (ref 38–126)
Anion gap: 6 (ref 5–15)
BUN: 15 mg/dL (ref 8–23)
CO2: 27 mmol/L (ref 22–32)
Calcium: 8.9 mg/dL (ref 8.9–10.3)
Chloride: 106 mmol/L (ref 98–111)
Creatinine, Ser: 0.69 mg/dL (ref 0.44–1.00)
GFR calc Af Amer: >60 mL/min (ref >60)
GFR calc non Af Amer: >60 mL/min (ref >60)
Glucose, Bld: 70 mg/dL (ref 70–99)
Potassium: 3.6 mmol/L (ref 3.5–5.1)
Sodium: 139 mmol/L (ref 135–145)
Total Bilirubin: 0.6 mg/dL (ref 0.3–1.2)
Total Protein: 6.9 g/dL (ref 6.5–8.1)

## 2018-08-16 LAB — BRAIN NATRIURETIC PEPTIDE: B Natriuretic Peptide: 18.4 pg/mL (ref 0.0–100.0)

## 2018-08-16 MED ORDER — CLOTRIMAZOLE 1 % EX CREA: TOPICAL_CREAM | CUTANEOUS | 0 refills | Status: DC

## 2018-08-16 NOTE — Discharge Instructions (Signed)
Use the cream 2 times a day until rash resolves.  It is very important that you follow up with your primary care doctor next week for further evaluation of your leg swelling.  Wear compression socks to help with swelling. Use lotion for dry skin/itching of the legs.  Return to the ER with any new, worsening, or concerning symptoms.

## 2018-08-16 NOTE — ED Provider Notes (Signed)
[facility] EMERGENCY DEPARTMENT Provider Note   CSN: [id number] Arrival date & time: 08/15/18  2100     History   Chief Complaint Chief Complaint  Patient presents with  . Rash  . Leg Swelling    HPI Lorraine Ryan is a 63 y.o. female presenting for evaluation of bilateral leg swelling and groin rash.  Patient states of the past several months, she has been having progressively worsening bilateral leg swelling.  She reports mild pain and itching of the skin.  She saw her orthopedist, who felt this was not an orthopedic issue, recommend follow-up with primary care.  She has not been able to do so yet.  She reports new new chest pain or shortness of breath.  No new medication changes.  Patient states she has been told that she has extra fluid about 11 months ago, but is not on any fluid pills.  She does take amlodipine, no change in dose recently.  She denies urinary symptoms.  She denies fevers, chills, cough, nausea, vomiting, abdominal pain.  She denies history of the same.  Swelling is not improved when she first wakes up in the morning or after keeping her feet elevated. Patient states 3 days ago she started to develop a rash in her groin.  It is bilateral and itchy.  She has been placing a dry towel there, which has improved but not completely resolved her symptoms.  She has not tried any creams or powders.     HPI  Past Medical History:  Diagnosis Date  . Anxiety   . Arthritis   . Back pain   . Bipolar disorder ([place])   . Chronic headaches   . Dementia ([place])   . Depression   . Diabetes mellitus without complication ([place])   . GERD (gastroesophageal reflux disease)   . Hypertension    No meds prescribed; states intermittent  . Neuromuscular disorder ([place])   . Shortness of breath dyspnea   . Stroke [facility])    caused numbness in leg    Patient Active Problem List   Diagnosis Date Noted  . Acute pain of right shoulder 03/03/2018  . Muscle spasm  03/03/2018  . Frequent PVCs 12/29/2017  . History of fall 10/23/2017  . Depression   . Anxiety   . Abdominal pain, epigastric 03/30/2015  . Dysphagia 03/30/2015  . History of lumbar laminectomy for spinal cord decompression 02/08/2015  . Hyperlipidemia 04/20/2014  . Chronic midline low back pain without sciatica 03/01/2013  . Numbness in right leg 03/01/2013  . Chest pain on exertion 03/01/2013  . Hypertension   . Stroke [facility])     Past Surgical History:  Procedure Laterality Date  . ABDOMINAL HYSTERECTOMY     no cervix  . COLONOSCOPY WITH PROPOFOL N/A 04/18/2015   ZWC:HENIDPOE hemorrhoids/diverticulosis sigmoid colon/  . ESOPHAGOGASTRODUODENOSCOPY (EGD) WITH PROPOFOL N/A 04/18/2015   SLF:web in the proximal esophagus/dilated/  . GANGLION CYST EXCISION    . LUMBAR LAMINECTOMY/DECOMPRESSION MICRODISCECTOMY N/A 02/08/2015   Procedure: L4-5 Decompression;  Surgeon: Marybelle Killings, MD;  Location: [facility];  Service: Orthopedics;  Laterality: N/A;  . POLYPECTOMY  04/18/2015   Procedure: POLYPECTOMY;  Surgeon: Danie Binder, MD;  [facility];  Service: Endoscopy;;  ascending colon polyp  . TONSILLECTOMY       OB History   No obstetric history on file.      Home Medications    Prior to Admission medications   Medication Sig Start Date  End Date Taking? Authorizing Provider  amLODipine (NORVASC) 10 MG tablet Take 1 tablet (10 mg total) by mouth daily. 07/22/18  Yes Gildardo Pounds, NP  DULoxetine (CYMBALTA) 60 MG capsule Take 1 capsule (60 mg total) by mouth daily. 05/28/18  Yes Arfeen, Arlyce Harman, MD  gabapentin (NEURONTIN) 300 MG capsule Take 1 capsule (300 mg total) by mouth 3 (three) times daily. 07/22/18  Yes Gildardo Pounds, NP  lisinopril-hydrochlorothiazide (ZESTORETIC) 20-25 MG tablet Take 1 tablet by mouth daily. 07/22/18  Yes Gildardo Pounds, NP  metoprolol succinate (TOPROL XL) 25 MG 24 hr tablet Take 1 tablet (25 mg total) by mouth daily. 11/28/17  Yes Fay Records, MD   rosuvastatin (CRESTOR) 40 MG tablet Take 1 tablet (40 mg total) by mouth daily. 06/17/18 09/15/18 Yes Gildardo Pounds, NP  traZODone (DESYREL) 150 MG tablet Take 1 tablet (150 mg total) by mouth at bedtime. 05/28/18  Yes Arfeen, Arlyce Harman, MD  ziprasidone (GEODON) 80 MG capsule Take 1 capsule (80 mg total) by mouth daily. Take with food 05/28/18  Yes Arfeen, Arlyce Harman, MD  clotrimazole (LOTRIMIN) 1 % cream Apply to affected area 2 times daily 08/16/18   Nilesh Stegall, PA-C  cyclobenzaprine (FLEXERIL) 5 MG tablet TAKE 1 TABLET (5 MG TOTAL) BY MOUTH DAILY AS NEEDED FOR MUSCLE SPASMS. Patient not taking: Reported on 08/16/2018 06/01/18   Lesleigh Noe, MD  naproxen (NAPROSYN) 500 MG tablet Take 1 tablet (500 mg total) by mouth daily as needed for moderate pain. With food Patient not taking: Reported on 08/16/2018 06/12/18   Gildardo Pounds, NP    Family History Family History  Problem Relation Age of Onset  . Diabetes Mother   . Hypertension Mother   . Heart disease Other        No family history  . Breast cancer Sister        diagnosed in her 18's  . Anxiety disorder Sister   . Depression Sister   . Anxiety disorder Brother   . Depression Brother   . Anxiety disorder Brother   . Depression Brother   . Cancer Maternal Grandmother        unknown type  . Colon cancer Neg Hx     Social History Social History   Tobacco Use  . Smoking status: Former Smoker    Packs/day: 0.50    Years: 10.00    Pack years: 5.00    Types: Cigarettes    Quit date: 03/29/1985    Years since quitting: 33.4  . Smokeless tobacco: Never Used  Substance Use Topics  . Alcohol use: No    Alcohol/week: 0.0 standard drinks    Comment: Used to drink heavily, no ETOH in "30-some years"  . Drug use: No    Comment: History of crack, "no drugs in 30-some years"     Allergies   Codeine, Oxycodone, Prednisone, and Tramadol   Review of Systems Review of Systems  Cardiovascular: Positive for leg swelling.  Skin:  Positive for rash.  All other systems reviewed and are negative.    Physical Exam Updated Vital Signs BP 124/71   Pulse 81   Temp 97.9 F (36.6 C) (Oral)   Resp 20   SpO2 97%   Physical Exam Vitals signs and nursing note reviewed.  Constitutional:      General: She is not in acute distress.    Appearance: She is well-developed.     Comments: Appears nontoxic.  HENT:  Head: Normocephalic and atraumatic.  Eyes:     Extraocular Movements: Extraocular movements intact.     Conjunctiva/sclera: Conjunctivae normal.     Pupils: Pupils are equal, round, and reactive to light.  Neck:     Musculoskeletal: Normal range of motion and neck supple.  Cardiovascular:     Rate and Rhythm: Normal rate and regular rhythm.     Pulses: Normal pulses.  Pulmonary:     Effort: Pulmonary effort is normal. No respiratory distress.     Breath sounds: Normal breath sounds. No wheezing.     Comments: No respiratory distress.  Speaking in full sentences.  Clear lung sounds in all fields. Abdominal:     General: There is no distension.     Palpations: Abdomen is soft. There is no mass.     Tenderness: There is no abdominal tenderness. There is no guarding or rebound.  Genitourinary:    Comments: Rash of the inguinal area consistent with intertrigo Musculoskeletal: Normal range of motion.     Right lower leg: Edema present.     Left lower leg: Edema present.     Comments: 2+ pitting edema bilaterally.  Pedal pulses palpable.  Sensation of lower extremities intact bilaterally.  Good distal cap refill.  No erythema.  Skin:    General: Skin is warm and dry.     Capillary Refill: Capillary refill takes less than 2 seconds.  Neurological:     Mental Status: She is alert and oriented to person, place, and time.      ED Treatments / Results  Labs (all labs ordered are listed, but only abnormal results are displayed) Labs Reviewed  URINALYSIS, ROUTINE W REFLEX MICROSCOPIC - Abnormal; Notable for  the following components:      Result Value   Color, Urine STRAW (*)    Hgb urine dipstick SMALL (*)    Leukocytes,Ua TRACE (*)    Bacteria, UA RARE (*)    All other components within normal limits  CBC WITH DIFFERENTIAL/PLATELET  COMPREHENSIVE METABOLIC PANEL  BRAIN NATRIURETIC PEPTIDE    EKG None  Radiology Dg Chest 2 View  Result Date: 08/15/2018 CLINICAL DATA:  Peripheral edema EXAM: CHEST - 2 VIEW COMPARISON:  07/01/2018 FINDINGS: Cardiac shadow is stable. Lungs are well aerated bilaterally. No focal infiltrate or sizable effusion is seen. No acute bony abnormality is noted. IMPRESSION: No active cardiopulmonary disease. Electronically Signed   By: Inez Catalina M.D.   On: 08/15/2018 23:56    Procedures Procedures (including critical care time)  Medications Ordered in ED Medications - No data to display   Initial Impression / Assessment and Plan / ED Course  I have reviewed the triage vital signs and the nursing notes.  Pertinent labs & imaging results that were available during my care of the patient were reviewed by me and considered in my medical decision making (see chart for details).        Patient presenting for evaluation of peripheral edema and inguinal rash.  On exam, patient appears nontoxic.  No respiratory distress.  Consider new onset CHF.  Also consider amlodipine causing leg swelling.  Consider renal dysfunction.  As such, obtain labs, chest x-ray, and urine.  Inguinal rash consistent with intertrigo.  Labs reassuring, BNP negative.  Electrolytes stable.  Creatinine stable.  Urine without obvious infection, and as she is not having urinary symptoms will hold on any treatment.  X-ray viewed interpreted by me, no pneumonia, pneumothorax, effusion, or cardiomegaly.  Discussed findings with  patient.  Discussed at this time, no signs of heart failure or renal dysfunction.  Discussed symptomatic treatment with compression socks.  Discussed possibility of  amlodipine causing leg swelling.  Encourage follow-up with primary care to see if anti-hypertensive medication needs to be changed.  At this time, patient appears safe for discharge.  Return precautions given.  Patient states she understands and agrees to plan.  Final Clinical Impressions(s) / ED Diagnoses   Final diagnoses:  Leg swelling  Intertrigo    ED Discharge Orders         Ordered    clotrimazole (LOTRIMIN) 1 % cream     08/16/18 0129           Franchot Heidelberg, PA-C 08/16/18 0202    Mesner, Corene Cornea, MD 08/16/18 5183

## 2018-08-28 ENCOUNTER — Ambulatory Visit (HOSPITAL_COMMUNITY): Payer: Medicare Other | Admitting: Psychiatry

## 2018-08-31 ENCOUNTER — Ambulatory Visit: Payer: Medicare Other | Attending: Nurse Practitioner | Admitting: Nurse Practitioner

## 2018-08-31 ENCOUNTER — Encounter: Payer: Self-pay | Admitting: Nurse Practitioner

## 2018-08-31 ENCOUNTER — Other Ambulatory Visit: Payer: Self-pay

## 2018-08-31 DIAGNOSIS — G8929 Other chronic pain: Secondary | ICD-10-CM

## 2018-08-31 DIAGNOSIS — R600 Localized edema: Secondary | ICD-10-CM

## 2018-08-31 MED ORDER — TIZANIDINE HCL 4 MG PO TABS: 4.0000 mg | ORAL_TABLET | Freq: Four times a day (QID) | ORAL | 0 refills | Status: DC | PRN

## 2018-08-31 NOTE — Progress Notes (Signed)
Virtual Visit via Telephone Note Due to national recommendations of social distancing due to [place] 19, telehealth visit is felt to be most appropriate for this patient at this time.  I discussed the limitations, risks, security and privacy concerns of performing an evaluation and management service by telephone and the availability of in person appointments. I also discussed with the patient that there may be a patient responsible charge related to this service. The patient expressed understanding and agreed to proceed.    I connected with Lorraine Ryan on 08/31/18  at   4:10 PM EDT  EDT by telephone and verified that I am speaking with the correct person using two identifiers.   Consent I discussed the limitations, risks, security and privacy concerns of performing an evaluation and management service by telephone and the availability of in person appointments. I also discussed with the patient that there may be a patient responsible charge related to this service. The patient expressed understanding and agreed to proceed.   Location of Patient: Private  Residence   Location of Provider: [place] participating in Telemedicine visit: [name] FNP-BC [place]    History of Present Illness: Telemedicine visit for: BLE swelling  Edema: Patient complains of edema. The location of the edema is lower leg(s) bilateral, ankle(s) bilateral, feet bilateral.  The edema has been mild and moderate.  Onset of symptoms was a few months ago, unchanged since that time. The edema is present all day.  The swelling has been aggravated by increased salt intake and poor dietary choices, possibly amlodipine 10mg , relieved by nothing, although she does not wear compression socks nor has she made any dietary changes, and been associated with use of calcium channel blockers. She eats out often, poor food choices, not drinking enough water.  Endorses R >L edema.  BNP (last 3 results) Recent Labs    08/15/18 2333  BNP 18.4   CHEST - 2 VIEW 08-15-2018 COMPARISON:  07/01/2018 FINDINGS: Cardiac shadow is stable. Lungs are well aerated bilaterally. No focal infiltrate or sizable effusion is seen. No acute bony abnormality is noted. IMPRESSION: No active cardiopulmonary disease.  BP Readings from Last 3 Encounters:  08/16/18 124/71  08/04/18 133/84  07/01/18 (!) 129/102    Past Medical History:  Diagnosis Date  . Anxiety   . Arthritis   . Back pain   . Bipolar disorder ([place])   . Chronic headaches   . Dementia ([place])   . Depression   . Diabetes mellitus without complication ([place])   . GERD (gastroesophageal reflux disease)   . Hypertension    No meds prescribed; states intermittent  . Neuromuscular disorder ([place])   . Shortness of breath dyspnea   . Stroke [facility])    caused numbness in leg    Past Surgical History:  Procedure Laterality Date  . ABDOMINAL HYSTERECTOMY     no cervix  . COLONOSCOPY WITH PROPOFOL N/A 04/18/2015   GGY:IRSWNIOE hemorrhoids/diverticulosis sigmoid colon/  . ESOPHAGOGASTRODUODENOSCOPY (EGD) WITH PROPOFOL N/A 04/18/2015   SLF:web in the proximal esophagus/dilated/  . GANGLION CYST EXCISION    . LUMBAR LAMINECTOMY/DECOMPRESSION MICRODISCECTOMY N/A 02/08/2015   Procedure: L4-5 Decompression;  Surgeon: Marybelle Killings, MD;  Location: [facility];  Service: Orthopedics;  Laterality: N/A;  . POLYPECTOMY  04/18/2015   Procedure: POLYPECTOMY;  Surgeon: Danie Binder, MD;  [facility];  Service: Endoscopy;;  ascending colon polyp  . TONSILLECTOMY  Family History  Problem Relation Age of Onset  . Diabetes Mother   . Hypertension Mother   . Heart disease Other        No family history  . Breast cancer Sister        diagnosed in her 58's  . Anxiety disorder Sister   . Depression Sister   . Anxiety disorder Brother   . Depression Brother   . Anxiety disorder Brother   . Depression  Brother   . Cancer Maternal Grandmother        unknown type  . Colon cancer Neg Hx     Social History   Socioeconomic History  . Marital status: Divorced    Spouse name: Not on file  . Number of children: 5  . Years of education: Not on file  . Highest education level: Associate degree: occupational, Hotel manager, or vocational program  Occupational History  . Not on file  Social Needs  . Financial resource strain: Very hard  . Food insecurity    Worry: Sometimes true    Inability: Sometimes true  . Transportation needs    Medical: No    Non-medical: No  Tobacco Use  . Smoking status: Former Smoker    Packs/day: 0.50    Years: 10.00    Pack years: 5.00    Types: Cigarettes    Quit date: 03/29/1985    Years since quitting: 33.4  . Smokeless tobacco: Never Used  Substance and Sexual Activity  . Alcohol use: No    Alcohol/week: 0.0 standard drinks    Comment: Used to drink heavily, no ETOH in "30-some years"  . Drug use: No    Comment: History of crack, "no drugs in 30-some years"  . Sexual activity: Not Currently  Lifestyle  . Physical activity    Days per week: 1 day    Minutes per session: 60 min  . Stress: Very much  Relationships  . Social Herbalist on phone: Not on file    Gets together: Not on file    Attends religious service: Never    Active member of club or organization: Yes    Attends meetings of clubs or organizations: More than 4 times per year    Relationship status: Divorced  Other Topics Concern  . Not on file  Social History Narrative   Lives with Son Lorraine Ryan - most of the time   Has a good relationship with Lorraine Ryan   Has 5 children total - 2 are in [place] and the other 2 live farther away and she does not speak with them often   Exercise: gets in the pool    Diet: eats a lot of fast food     Observations/Objective: Awake, alert and oriented x 3   ROS  Assessment and Plan: [name] was seen today for edema.  Diagnoses and all  orders for this visit:  Bilateral lower extremity edema Stop amlodipine. Recheck BP and evaluate for decreased swelling 7-10 days. She will notify me by mychart for any elevated home BP readings.   Chronic midline low back pain without sciatica -     tiZANidine (ZANAFLEX) 4 MG tablet; Take 1 tablet (4 mg total) by mouth every 6 (six) hours as needed for muscle spasms.  Work on losing weight to help reduce back pain. May alternate with heat and ice application for pain relief. May also alternate with acetaminophen and Ibuprofen as prescribed for back pain. Other alternatives include massage, acupuncture  and water aerobics.  You must stay active and avoid a sedentary lifestyle.    Follow Up Instructions Return in about 10 days (around 09/10/2018) for BP recheck..     I discussed the assessment and treatment plan with the patient. The patient was provided an opportunity to ask questions and all were answered. The patient agreed with the plan and demonstrated an understanding of the instructions.   The patient was advised to call back or seek an in-person evaluation if the symptoms worsen or if the condition fails to improve as anticipated.  I provided 24 minutes of non-face-to-face time during this encounter including median intraservice time, reviewing previous notes, labs, imaging, medications and explaining diagnosis and management.  Lorraine Pounds, FNP-BC

## 2018-09-02 ENCOUNTER — Other Ambulatory Visit: Payer: Self-pay

## 2018-09-02 ENCOUNTER — Ambulatory Visit (INDEPENDENT_AMBULATORY_CARE_PROVIDER_SITE_OTHER): Payer: Medicare Other | Admitting: Psychiatry

## 2018-09-02 ENCOUNTER — Encounter (HOSPITAL_COMMUNITY): Payer: Self-pay | Admitting: Psychiatry

## 2018-09-02 DIAGNOSIS — F411 Generalized anxiety disorder: Secondary | ICD-10-CM

## 2018-09-02 DIAGNOSIS — F319 Bipolar disorder, unspecified: Secondary | ICD-10-CM | POA: Diagnosis not present

## 2018-09-02 MED ORDER — TRAZODONE HCL 150 MG PO TABS: 150.0000 mg | ORAL_TABLET | Freq: Every day | ORAL | 0 refills | Status: DC

## 2018-09-02 MED ORDER — ZIPRASIDONE HCL 80 MG PO CAPS: 80.0000 mg | ORAL_CAPSULE | Freq: Every day | ORAL | 0 refills | Status: DC

## 2018-09-02 MED ORDER — DULOXETINE HCL 60 MG PO CPEP: 60.0000 mg | ORAL_CAPSULE | Freq: Every day | ORAL | 0 refills | Status: DC

## 2018-09-02 NOTE — Progress Notes (Signed)
Virtual Visit via Telephone Note  I connected with Lorraine Ryan on 09/02/18 at  8:40 AM EDT by telephone and verified that I am speaking with the correct person using two identifiers.   I discussed the limitations, risks, security and privacy concerns of performing an evaluation and management service by telephone and the availability of in person appointments. I also discussed with the patient that there may be a patient responsible charge related to this service. The patient expressed understanding and agreed to proceed.   History of Present Illness: Patient was evaluated by phone session.  On her last visit we increased trazodone and Geodon.  She still hear voices sometimes and see people but they are not as intense.  Her biggest concern is her living situation.  Her son is moving and she is not sure where.  She stays most of the time in her truck and looking for a stable place.  She denies any agitation, anger, irritability.  She feels the medicine helping her calm and she does not get into argument with people.  She has residual paranoia but she is able to handle it and does not get agitated.  Recently she was seen in the emergency room for shoulder pain and also seen PCP for leg swelling.  She admitted some concern about her health issues as she still have swelling and not getting better.  She is tolerating her medication and reported no tremors shakes or any EPS.  Patient has chronic health issues including headaches, diabetes, GERD, hypertension and chronic neck pain.  She admitted her weight is increased but she believe due to leg swelling.  Her energy level is fair.  She denies drinking or using any illegal substances.  She wants to continue her medication.  She is not interested in therapy.   Past Psychiatric History:Reviewed. No history of psychiatric inpatient treatment or any suicidal attempt. History of paranoia, hallucination, mania, anger and drug use. Seen in the past at [facility]  and then [facility] and prescribe Latuda which made her sick. Tried Abilify but do not remember why it was switched to Geodon. No history of abuse, no history of panic attacks.   Recent Results (from the past 2160 hour(s))  Drug Screen 12+Alcohol+CRT, Ur     Status: None   Collection Time: 06/15/18 10:00 AM  Result Value Ref Range   Ethanol, Urine Negative Cutoff=0.020 %   Amphetamines, Urine Negative Cutoff=1000 ng/mL    Comment: Amphetamine test includes Amphetamine and Methamphetamine.   Barbiturate Negative Cutoff=200 ng/mL   BENZODIAZ UR QL Negative Cutoff=200 ng/mL   Cannabinoids Negative Cutoff=20 ng/mL   Cocaine (Metabolite) Negative Cutoff=300 ng/mL   OPIATE SCREEN URINE Negative Cutoff=300 ng/mL    Comment: Opiate test includes Codeine, Morphine, Hydromorphone, Hydrocodone.   Oxycodone/Oxymorphone, Urine Negative Cutoff=300 ng/mL    Comment: Test includes Oxycodone and Oxymorphone   Phencyclidine Negative Cutoff=25 ng/mL   Methadone Negative Cutoff=300 ng/mL   Propoxyphene Negative Cutoff=300 ng/mL   Meperidine Negative Cutoff=200 ng/mL    Comment: This test was developed and its performance characteristics determined by [facility]. It has not been cleared or approved by the Food and Drug Administration.    Tramadol Negative Cutoff=200 ng/mL   Creatinine, Urine 90.3 20.0 - 300.0 mg/dL  Urinalysis, Complete     Status: Abnormal   Collection Time: 06/15/18 10:00 AM  Result Value Ref Range   Specific Gravity, UA 1.015 1.005 - 1.030   pH, UA 7.5 5.0 - 7.5   Color, UA Yellow Yellow  Appearance Ur Clear Clear   Leukocytes,UA 2+ (A) Negative   Protein,UA Negative Negative/Trace   Glucose, UA Negative Negative   Ketones, UA Negative Negative   RBC, UA Negative Negative   Bilirubin, UA Negative Negative   Urobilinogen, Ur 0.2 0.2 - 1.0 mg/dL   Nitrite, UA Negative Negative   Microscopic Examination See below:     Comment: Microscopic was indicated and was performed.   Microscopic Examination     Status: Abnormal   Collection Time: 06/15/18 10:00 AM  Result Value Ref Range   WBC, UA 6-10 (A) 0 - 5 /hpf   RBC 0-2 0 - 2 /hpf   Epithelial Cells (non renal) 0-10 0 - 10 /hpf   Casts None seen None seen /lpf   Mucus, UA Present Not Estab.   Bacteria, UA Few None seen/Few  Lipid panel     Status: Abnormal   Collection Time: 06/15/18 10:32 AM  Result Value Ref Range   Cholesterol, Total 235 (H) 100 - 199 mg/dL   Triglycerides 127 0 - 149 mg/dL   HDL 82 >39 mg/dL   VLDL Cholesterol Cal 25 5 - 40 mg/dL   LDL Calculated 128 (H) 0 - 99 mg/dL   Chol/HDL Ratio 2.9 0.0 - 4.4 ratio    Comment:                                   T. Chol/HDL Ratio                                             Men  Women                               1/2 Avg.Risk  3.4    3.3                                   Avg.Risk  5.0    4.4                                2X Avg.Risk  9.6    7.1                                3X Avg.Risk 23.4   11.0   VITAMIN D 25 Hydroxy (Vit-D Deficiency, Fractures)     Status: Abnormal   Collection Time: 06/15/18 10:32 AM  Result Value Ref Range   Vit D, 25-Hydroxy 25.2 (L) 30.0 - 100.0 ng/mL    Comment: Vitamin D deficiency has been defined by the [facility] practice guideline as a level of serum 25-OH vitamin D less than 20 ng/mL (1,2). The Endocrine Society went on to further define vitamin D insufficiency as a level between 21 and 29 ng/mL (2). 1. IOM (Institute of Medicine). 2010. Dietary reference    intakes for calcium and D. [place]: The    [employer]. 2. Holick MF, Binkley [place], Bischoff-Ferrari HA, et al.    Evaluation, treatment, and prevention of vitamin D  deficiency: an Endocrine Society clinical practice    guideline. JCEM. 2011 Jul; 96(7):1911-30.   CMP14+EGFR     Status: Abnormal   Collection Time: 06/15/18 10:32 AM  Result Value Ref Range   Glucose 91 65 - 99 mg/dL   BUN 6  (L) 8 - 27 mg/dL   Creatinine, Ser 0.71 0.57 - 1.00 mg/dL   GFR calc non Af Amer 91 >59 mL/min/1.73   GFR calc Af Amer 105 >59 mL/min/1.73   BUN/Creatinine Ratio 8 (L) 12 - 28   Sodium 144 134 - 144 mmol/L   Potassium 4.2 3.5 - 5.2 mmol/L   Chloride 104 96 - 106 mmol/L   CO2 24 20 - 29 mmol/L   Calcium 9.9 8.7 - 10.3 mg/dL   Total Protein 7.0 6.0 - 8.5 g/dL   Albumin 4.3 3.8 - 4.8 g/dL   Globulin, Total 2.7 1.5 - 4.5 g/dL   Albumin/Globulin Ratio 1.6 1.2 - 2.2   Bilirubin Total 0.4 0.0 - 1.2 mg/dL   Alkaline Phosphatase 102 39 - 117 IU/L   AST 30 0 - 40 IU/L   ALT 27 0 - 32 IU/L  CBC     Status: None   Collection Time: 06/15/18 10:32 AM  Result Value Ref Range   WBC 4.6 3.4 - 10.8 x10E3/uL   RBC 4.58 3.77 - 5.28 x10E6/uL   Hemoglobin 13.6 11.1 - 15.9 g/dL   Hematocrit 39.8 34.0 - 46.6 %   MCV 87 79 - 97 fL   MCH 29.7 26.6 - 33.0 pg   MCHC 34.2 31.5 - 35.7 g/dL   RDW 13.0 11.7 - 15.4 %   Platelets 287 150 - 450 x10E3/uL  CBC with Differential/Platelet     Status: None   Collection Time: 07/01/18  8:57 AM  Result Value Ref Range   WBC 5.1 4.0 - 10.5 K/uL   RBC 4.50 3.87 - 5.11 MIL/uL   Hemoglobin 13.3 12.0 - 15.0 g/dL   HCT 41.4 36.0 - 46.0 %   MCV 92.0 80.0 - 100.0 fL   MCH 29.6 26.0 - 34.0 pg   MCHC 32.1 30.0 - 36.0 g/dL   RDW 13.4 11.5 - 15.5 %   Platelets 255 150 - 400 K/uL   nRBC 0.0 0.0 - 0.2 %   Neutrophils Relative % 51 %   Neutro Abs 2.6 1.7 - 7.7 K/uL   Lymphocytes Relative 36 %   Lymphs Abs 1.8 0.7 - 4.0 K/uL   Monocytes Relative 10 %   Monocytes Absolute 0.5 0.1 - 1.0 K/uL   Eosinophils Relative 2 %   Eosinophils Absolute 0.1 0.0 - 0.5 K/uL   Basophils Relative 1 %   Basophils Absolute 0.1 0.0 - 0.1 K/uL   Immature Granulocytes 0 %   Abs Immature Granulocytes 0.02 0.00 - 0.07 K/uL    Comment: Performed at [facility], [address] [place], [place] [address]  Comprehensive metabolic panel     Status: None   Collection Time: 07/01/18   8:57 AM  Result Value Ref Range   Sodium 140 135 - 145 mmol/L   Potassium 3.6 3.5 - 5.1 mmol/L   Chloride 106 98 - 111 mmol/L   CO2 26 22 - 32 mmol/L   Glucose, Bld 88 70 - 99 mg/dL   BUN 12 8 - 23 mg/dL   Creatinine, Ser 0.55 0.44 - 1.00 mg/dL   Calcium 8.9 8.9 - 10.3 mg/dL   Total Protein 7.4 6.5 - 8.1 g/dL  Albumin 3.8 3.5 - 5.0 g/dL   AST 31 15 - 41 U/L   ALT 33 0 - 44 U/L   Alkaline Phosphatase 88 38 - 126 U/L   Total Bilirubin 0.8 0.3 - 1.2 mg/dL   GFR calc non Af Amer >60 >60 mL/min   GFR calc Af Amer >60 >60 mL/min   Anion gap 8 5 - 15    Comment: Performed at [facility], [address] [place], [place] [address]  Novel Coronavirus, NAA (hospital order; send-out to ref lab)     Status: None   Collection Time: 07/01/18  8:57 AM   Specimen: Nasopharyngeal Swab; Respiratory  Result Value Ref Range   SARS-CoV-2, NAA NOT DETECTED NOT DETECTED    Comment: (NOTE) This test was developed and its performance characteristics determined by [employer]. This test has not been FDA cleared or approved. This test has been authorized by FDA under an Emergency Use Authorization (EUA). This test is only authorized for the duration of time the declaration that circumstances exist justifying the authorization of the emergency use of in vitro diagnostic tests for detection of SARS-CoV-2 virus and/or diagnosis of COVID-19 infection under section 564(b)(1) of the Act, 21 U.S.C. 308MVH-8(I)(6), unless the authorization is terminated or revoked sooner. When diagnostic testing is negative, the possibility of a false negative result should be considered in the context of a patient's recent exposures and the presence of clinical signs and symptoms consistent with COVID-19. An individual without symptoms of COVID-19 and who is not shedding SARS-CoV-2 virus would expect to have a negative (not detected) result in this assay. Performed  At: [facility] [place], [place] [id number] Rush Farmer MD LK:[id number]    Coronavirus Source NASOPHARYNGEAL     Comment: Performed at [facility], [address] [place], [place] [address]  Urinalysis, Routine w reflex microscopic     Status: Abnormal   Collection Time: 07/01/18  9:07 AM  Result Value Ref Range   Color, Urine YELLOW (A) YELLOW   APPearance CLEAR (A) CLEAR   Specific Gravity, Urine 1.010 1.005 - 1.030   pH 7.0 5.0 - 8.0   Glucose, UA NEGATIVE NEGATIVE mg/dL   Hgb urine dipstick MODERATE (A) NEGATIVE   Bilirubin Urine NEGATIVE NEGATIVE   Ketones, ur NEGATIVE NEGATIVE mg/dL   Protein, ur NEGATIVE NEGATIVE mg/dL   Nitrite NEGATIVE NEGATIVE   Leukocytes,Ua NEGATIVE NEGATIVE   RBC / HPF 0-5 0 - 5 RBC/hpf   WBC, UA 0-5 0 - 5 WBC/hpf   Bacteria, UA RARE (A) NONE SEEN   Squamous Epithelial / LPF 0-5 0 - 5    Comment: Performed at [facility], [address] [place], [place] [address]  CBC with Differential     Status: None   Collection Time: 08/15/18 11:33 PM  Result Value Ref Range   WBC 7.1 4.0 - 10.5 K/uL   RBC 4.50 3.87 - 5.11 MIL/uL   Hemoglobin 13.6 12.0 - 15.0 g/dL   HCT 42.4 36.0 - 46.0 %   MCV 94.2 80.0 - 100.0 fL   MCH 30.2 26.0 - 34.0 pg   MCHC 32.1 30.0 - 36.0 g/dL   RDW 13.3 11.5 - 15.5 %   Platelets 245 150 - 400 K/uL   nRBC 0.0 0.0 - 0.2 %   Neutrophils Relative % 58 %   Neutro Abs 4.1 1.7 - 7.7 K/uL   Lymphocytes Relative 27 %   Lymphs Abs 1.9 0.7 -  4.0 K/uL   Monocytes Relative 10 %   Monocytes Absolute 0.7 0.1 - 1.0 K/uL   Eosinophils Relative 4 %   Eosinophils Absolute 0.3 0.0 - 0.5 K/uL   Basophils Relative 1 %   Basophils Absolute 0.1 0.0 - 0.1 K/uL   Immature Granulocytes 0 %   Abs Immature Granulocytes 0.02 0.00 - 0.07 K/uL    Comment: Performed at [facility], [place] [address] [place], [place] [address]  Comprehensive metabolic panel     Status: None   Collection Time: 08/15/18 11:33 PM  Result Value Ref Range    Sodium 139 135 - 145 mmol/L   Potassium 3.6 3.5 - 5.1 mmol/L   Chloride 106 98 - 111 mmol/L   CO2 27 22 - 32 mmol/L   Glucose, Bld 70 70 - 99 mg/dL   BUN 15 8 - 23 mg/dL   Creatinine, Ser 0.69 0.44 - 1.00 mg/dL   Calcium 8.9 8.9 - 10.3 mg/dL   Total Protein 6.9 6.5 - 8.1 g/dL   Albumin 3.5 3.5 - 5.0 g/dL   AST 20 15 - 41 U/L   ALT 22 0 - 44 U/L   Alkaline Phosphatase 86 38 - 126 U/L   Total Bilirubin 0.6 0.3 - 1.2 mg/dL   GFR calc non Af Amer >60 >60 mL/min   GFR calc Af Amer >60 >60 mL/min   Anion gap 6 5 - 15    Comment: Performed at [facility], [place] [address], [place], [place] [address]  Brain natriuretic peptide     Status: None   Collection Time: 08/15/18 11:33 PM  Result Value Ref Range   B Natriuretic Peptide 18.4 0.0 - 100.0 pg/mL    Comment: Performed at [facility] [address], [place], [place] [address]  Urinalysis, Routine w reflex microscopic     Status: Abnormal   Collection Time: 08/15/18 11:59 PM  Result Value Ref Range   Color, Urine STRAW (A) YELLOW   APPearance CLEAR CLEAR   Specific Gravity, Urine 1.006 1.005 - 1.030   pH 6.0 5.0 - 8.0   Glucose, UA NEGATIVE NEGATIVE mg/dL   Hgb urine dipstick SMALL (A) NEGATIVE   Bilirubin Urine NEGATIVE NEGATIVE   Ketones, ur NEGATIVE NEGATIVE mg/dL   Protein, ur NEGATIVE NEGATIVE mg/dL   Nitrite NEGATIVE NEGATIVE   Leukocytes,Ua TRACE (A) NEGATIVE   RBC / HPF 0-5 0 - 5 RBC/hpf   WBC, UA 0-5 0 - 5 WBC/hpf   Bacteria, UA RARE (A) NONE SEEN   Squamous Epithelial / LPF 0-5 0 - 5   Mucus PRESENT     Comment: Performed at [facility], [place] [address], [place], [place] [address]     Psychiatric Specialty Exam: Physical Exam  ROS  There were no vitals taken for this visit.There is no height or weight on file to calculate BMI.  General Appearance: NA  Eye Contact:  NA  Speech:  Slow  Volume:  Decreased  Mood:  Euthymic  Affect:  NA  Thought Process:  Descriptions of Associations: Intact   Orientation:  Full (Time, Place, and Person)  Thought Content:  Hallucinations: Auditory Visual and Rumination seeing people and hearing voices which are nonspecific.    Suicidal Thoughts:  No  Homicidal Thoughts:  No  Memory:  Immediate;   Fair Recent;   Fair Remote;   Fair  Judgement:  Fair  Insight:  Fair  Psychomotor Activity:  NA  Concentration:  Concentration: Fair and Attention Span: Fair  Recall:  Good  Fund of Knowledge:  Good  Language:  Fair  Akathisia:  No  Handed:  Right  AIMS (if indicated):     Assets:  Communication Skills Desire for Improvement Resilience  ADL's:  Intact  Cognition:  WNL  Sleep:   improved      Assessment and Plan: Bipolar disorder type I.  Generalized anxiety disorder.  I reviewed her blood work results, current medication.  She feels the current medicine is working since we increase the trazodone and Geodon.  She is actively looking for her own place as this is her biggest challenge.  Now her son is moving she is more concerned about her future living.  She is not interested in therapy.  Continue Cymbalta 60 mg daily, trazodone 150 mg at bedtime and Geodon 80 mg at bedtime.  Patient reported no tremors, shakes, rash or any itching.  Recommended to call us back if she has any question or any concern.  Follow-up in 3 months.  Follow Up Instructions:    I discussed the assessment and treatment plan with the patient. The patient was provided an opportunity to ask questions and all were answered. The patient agreed with the plan and demonstrated an understanding of the instructions.   The patient was advised to call back or seek an in-person evaluation if the symptoms worsen or if the condition fails to improve as anticipated.  I provided 24 minutes of non-face-to-face time during this encounter.   Kathlee Nations, MD

## 2018-09-08 ENCOUNTER — Other Ambulatory Visit: Payer: Self-pay | Admitting: Family Medicine

## 2018-09-08 ENCOUNTER — Other Ambulatory Visit (HOSPITAL_COMMUNITY): Payer: Self-pay | Admitting: Psychiatry

## 2018-09-08 DIAGNOSIS — G8929 Other chronic pain: Secondary | ICD-10-CM

## 2018-09-08 DIAGNOSIS — F411 Generalized anxiety disorder: Secondary | ICD-10-CM

## 2018-09-08 DIAGNOSIS — M62838 Other muscle spasm: Secondary | ICD-10-CM

## 2018-09-10 ENCOUNTER — Other Ambulatory Visit: Payer: Self-pay | Admitting: Nurse Practitioner

## 2018-09-10 DIAGNOSIS — G8929 Other chronic pain: Secondary | ICD-10-CM

## 2018-09-11 ENCOUNTER — Ambulatory Visit: Payer: Medicare Other | Admitting: Pharmacist

## 2018-09-14 ENCOUNTER — Other Ambulatory Visit: Payer: Self-pay | Admitting: Nurse Practitioner

## 2018-09-14 ENCOUNTER — Other Ambulatory Visit: Payer: Self-pay

## 2018-09-14 ENCOUNTER — Encounter: Payer: Self-pay | Admitting: Pharmacist

## 2018-09-14 ENCOUNTER — Ambulatory Visit: Payer: Medicare Other | Attending: Nurse Practitioner | Admitting: Pharmacist

## 2018-09-14 VITALS — BP 129/85 | HR 78

## 2018-09-14 DIAGNOSIS — R600 Localized edema: Secondary | ICD-10-CM

## 2018-09-14 DIAGNOSIS — I10 Essential (primary) hypertension: Secondary | ICD-10-CM

## 2018-09-14 MED ORDER — FUROSEMIDE 20 MG PO TABS: 20.0000 mg | ORAL_TABLET | Freq: Every day | ORAL | 0 refills | Status: DC

## 2018-09-14 MED ORDER — MISC. DEVICES MISC: 0 refills | Status: DC

## 2018-09-14 NOTE — Patient Instructions (Signed)
Thank you for coming to see [place] today.   Blood pressure today is very close to goal.   Continue taking blood pressure medications as prescribed.   Limiting salt and caffeine, as well as exercising as able for at least 30 minutes for 5 days out of the week, can also help you lower your blood pressure.  Take your blood pressure at home if you are able. Please write down these numbers and bring them to your visits.  If you have any questions about medications, please call me [phone].  Lurena Joiner

## 2018-09-14 NOTE — Progress Notes (Signed)
   S:    PCP: Zelda   Patient arrives in good spirits. Presents to the clinic for BP check.  Patient was referred and last seen via telemedicine by PCP on 08/31/18.   Patient reports adherence with medications.  Current BP Medications include:  Lisinopril-HCTZ 20-25 mg daily, Toprol-XL 25 mg daily  Dietary habits include:  - Noncompliant with salt restriction - Denies drinking caffeine  Exercise habits include:  - Reports walking daily but unable to provide an amount of time  Family / Social history:  - FHx: HTN (mother); DM (mother) - Former smoker (quit in 27) - Does not drink alcohol  O:  Home BP readings:  - Per pt, this varies  - Unable to give specific range   Last 3 Office BP readings: BP Readings from Last 3 Encounters:  09/14/18 129/85  08/16/18 124/71  08/04/18 133/84   BMET    Component Value Date/Time   NA 139 08/15/2018 2333   NA 144 06/15/2018 1032   K 3.6 08/15/2018 2333   CL 106 08/15/2018 2333   CO2 27 08/15/2018 2333   GLUCOSE 70 08/15/2018 2333   BUN 15 08/15/2018 2333   BUN 6 (L) 06/15/2018 1032   CREATININE 0.69 08/15/2018 2333   CREATININE 0.71 02/12/2016 1628   CALCIUM 8.9 08/15/2018 2333   GFRNONAA >60 08/15/2018 2333   GFRNONAA >89 02/12/2016 1628   GFRAA >60 08/15/2018 2333   GFRAA >89 02/12/2016 1628   Renal function: CrCl cannot be calculated (Patient's most recent lab result is older than the maximum 21 days allowed.).  Clinical ASCVD: Yes  The ASCVD Risk score Mikey Bussing DC Jr., et al., 2013) failed to calculate for the following reasons:   The patient has a prior MI or stroke diagnosis  A/P: Hypertension longstanding currently close to goal on current medications. BP Goal = <130/80 mmHg. Patient is adherent with current medications. PCP came in briefly to see pt and check for edema; PCP is adding Lasix and further emphasized salt restriction.  -Continued current regimen. -Add Lasix per Zelda for LE edema. -Counseled on lifestyle  modifications for blood pressure control including reduced dietary sodium, increased exercise, adequate sleep  Results reviewed and written information provided. Total time in face-to-face counseling 15 minutes.   F/U Clinic Visit in 2-3 weeks with Zelda.    Benard Halsted, PharmD, [place] [phone]

## 2018-09-22 ENCOUNTER — Other Ambulatory Visit: Payer: Self-pay | Admitting: Nurse Practitioner

## 2018-09-22 DIAGNOSIS — G8929 Other chronic pain: Secondary | ICD-10-CM

## 2018-09-22 DIAGNOSIS — I10 Essential (primary) hypertension: Secondary | ICD-10-CM

## 2018-09-23 ENCOUNTER — Telehealth: Payer: Self-pay | Admitting: Nurse Practitioner

## 2018-09-23 NOTE — Telephone Encounter (Signed)
Will route to PCP 

## 2018-09-23 NOTE — Telephone Encounter (Signed)
Pt would like something prescribed for her swollen feet...please follow up

## 2018-09-24 NOTE — Telephone Encounter (Signed)
Please confirm she picked up her furosemide from several days ago.

## 2018-09-25 NOTE — Telephone Encounter (Signed)
CMA attempt to reach patient to verified if she had picked up her Furosemide.  No answer and LVM for patient to call back.

## 2018-09-30 ENCOUNTER — Telehealth: Payer: Self-pay | Admitting: Nurse Practitioner

## 2018-09-30 NOTE — Telephone Encounter (Signed)
1) Medication(s) Requested (by name): Gabapentin  2) Pharmacy of Choice: [facility]  Pharmacy states they dont have the additional refill  3) Special Requests:   Approved medications will be sent to the pharmacy, we will reach out if there is an issue.  Requests made after 3pm may not be addressed until the following business day!  If a patient is unsure of the name of the medication(s) please note and ask patient to call back when they are able to provide all info, do not send to responsible party until all information is available!

## 2018-09-30 NOTE — Telephone Encounter (Signed)
This was sent 09/12/18 to [facility]. Pt will need to contact them for refills.

## 2018-10-06 ENCOUNTER — Encounter: Payer: Self-pay | Admitting: Nurse Practitioner

## 2018-10-06 ENCOUNTER — Ambulatory Visit: Payer: Medicare Other | Attending: Nurse Practitioner | Admitting: Nurse Practitioner

## 2018-10-06 ENCOUNTER — Other Ambulatory Visit: Payer: Self-pay

## 2018-10-06 VITALS — BP 110/66 | HR 94 | Temp 98.6°F | Ht 66.0 in | Wt 284.0 lb

## 2018-10-06 DIAGNOSIS — I10 Essential (primary) hypertension: Secondary | ICD-10-CM

## 2018-10-06 DIAGNOSIS — R0602 Shortness of breath: Secondary | ICD-10-CM

## 2018-10-06 DIAGNOSIS — R600 Localized edema: Secondary | ICD-10-CM | POA: Diagnosis not present

## 2018-10-06 LAB — POCT ABI - SCREENING FOR PILOT NO CHARGE
Left ABI: 1.13
Right ABI: 1.14

## 2018-10-06 MED ORDER — FUROSEMIDE 20 MG PO TABS: 20.0000 mg | ORAL_TABLET | Freq: Every day | ORAL | 1 refills | Status: DC

## 2018-10-06 NOTE — Progress Notes (Signed)
Assessment & Plan:  Lorraine Ryan was seen today for follow-up.  Diagnoses and all orders for this visit:  Essential hypertension Continue all antihypertensives as prescribed.  Remember to bring in your blood pressure log with you for your follow up appointment.  DASH/Mediterranean Diets are healthier choices for HTN.   Bilateral edema of lower extremity -     ECHOCARDIOGRAM COMPLETE; Future -     furosemide (LASIX) 20 MG tablet; Take 1 tablet (20 mg total) by mouth daily. -     POCT ABI Screening Pilot No Charge Will discontinue amlodipine at this time   Shortness of breath -     ECHOCARDIOGRAM COMPLETE; Future -     furosemide (LASIX) 20 MG tablet; Take 1 tablet (20 mg total) by mouth daily.    Patient has been counseled on age-appropriate routine health concerns for screening and prevention. These are reviewed and up-to-date. Referrals have been placed accordingly. Immunizations are up-to-date or declined.    Subjective:   Chief Complaint  Patient presents with  . Follow-up    Pt. is here for HTN. Both of pt's leg are still swollen.    HPI Nikkole Gilster 63 y.o. female presents to office today with bilateral LE edema and HTN.  has a past medical history of Anxiety, Arthritis, Back pain, Bipolar disorder ([place]), Chronic headaches, Dementia ([place]), Depression, Diabetes mellitus without complication ([place]), GERD (gastroesophageal reflux disease), Hypertension, Neuromuscular disorder ([place]), Shortness of breath dyspnea, and Stroke ([place]).    Essential Hypertension Blood pressure is well controlled.  Current medications include lisinopril-hydrochlorothiazide 20-25 mg daily, metoprolol succinate 25 mg daily.  She ran out of furosemide. Denies chest pain,palpitations, lightheadedness, dizziness, headaches or visual disturbances. BP Readings from Last 3 Encounters:  10/06/18 110/66  09/14/18 129/85  08/16/18 124/71    Edema: Patient complains of bilateral lower extremity edema. The  edema has been mild and moderate.  Onset of symptoms was 3 months ago, unchanged since that time. The edema is present all day.   The swelling has been aggravated by :Unknown as patient reports she has significantly reduced her salt intake, relieved by nothing, and been associated with shortness of breath, use of calcium channel blockers and weight gain. Cardiac risk factors include dyslipidemia, hypertension, obesity (BMI >= 30 kg/m2) and sedentary lifestyle. She was prescribed Lasix 20 mg for 2 weeks however she has currently run out.  Will refill today.  BNP normal.  Review of Systems  Constitutional: Negative for fever, malaise/fatigue and weight loss.  HENT: Negative.  Negative for nosebleeds.   Eyes: Negative.  Negative for blurred vision, double vision and photophobia.  Respiratory: Positive for shortness of breath. Negative for cough.   Cardiovascular: Positive for leg swelling. Negative for chest pain, palpitations, claudication and PND.  Gastrointestinal: Negative.  Negative for heartburn, nausea and vomiting.  Musculoskeletal: Negative.  Negative for myalgias.  Neurological: Negative.  Negative for dizziness, focal weakness, seizures and headaches.  Psychiatric/Behavioral: Negative.  Negative for suicidal ideas.    Past Medical History:  Diagnosis Date  . Anxiety   . Arthritis   . Back pain   . Bipolar disorder ([place])   . Chronic headaches   . Dementia ([place])   . Depression   . Diabetes mellitus without complication ([place])   . GERD (gastroesophageal reflux disease)   . Hypertension    No meds prescribed; states intermittent  . Neuromuscular disorder ([place])   . Shortness of breath dyspnea   . Stroke [facility])    caused  numbness in leg    Past Surgical History:  Procedure Laterality Date  . ABDOMINAL HYSTERECTOMY     no cervix  . COLONOSCOPY WITH PROPOFOL N/A 04/18/2015   [id number] hemorrhoids/diverticulosis sigmoid colon/  . ESOPHAGOGASTRODUODENOSCOPY (EGD) WITH PROPOFOL  N/A 04/18/2015   SLF:web in the proximal esophagus/dilated/  . GANGLION CYST EXCISION    . LUMBAR LAMINECTOMY/DECOMPRESSION MICRODISCECTOMY N/A 02/08/2015   Procedure: L4-5 Decompression;  Surgeon: Marybelle Killings, MD;  Location: [facility];  Service: Orthopedics;  Laterality: N/A;  . POLYPECTOMY  04/18/2015   Procedure: POLYPECTOMY;  Surgeon: Danie Binder, MD;  [facility];  Service: Endoscopy;;  ascending colon polyp  . TONSILLECTOMY      Family History  Problem Relation Age of Onset  . Diabetes Mother   . Hypertension Mother   . Heart disease Other        No family history  . Breast cancer Sister        diagnosed in her 67's  . Anxiety disorder Sister   . Depression Sister   . Anxiety disorder Brother   . Depression Brother   . Anxiety disorder Brother   . Depression Brother   . Cancer Maternal Grandmother        unknown type  . Colon cancer Neg Hx     Social History Reviewed with no changes to be made today.   Outpatient Medications Prior to Visit  Medication Sig Dispense Refill  . clotrimazole (LOTRIMIN) 1 % cream Apply to affected area 2 times daily 15 g 0  . DULoxetine (CYMBALTA) 60 MG capsule Take 1 capsule (60 mg total) by mouth daily. 90 capsule 0  . gabapentin (NEURONTIN) 300 MG capsule TAKE 1 CAPSULE BY MOUTH THREE TIMES DAILY 90 capsule 1  . lisinopril-hydrochlorothiazide (ZESTORETIC) 20-25 MG tablet TAKE 1 TABLET BY MOUTH DAILY. 90 tablet 11  . metoprolol succinate (TOPROL XL) 25 MG 24 hr tablet Take 1 tablet (25 mg total) by mouth daily. 90 tablet 3  . Misc. Devices MISC Please provide patient with insurance approved custom compression stockings 1 each 0  . tiZANidine (ZANAFLEX) 4 MG tablet TAKE 1 TABLET BY MOUTH EVERY 6 HOURS AS NEEDED FOR MUSCLE SPASMS 60 tablet 3  . traZODone (DESYREL) 150 MG tablet Take 1 tablet (150 mg total) by mouth at bedtime. 90 tablet 0  . ziprasidone (GEODON) 80 MG capsule Take 1 capsule (80 mg total) by mouth daily. Take with food  90 capsule 0  . amLODipine (NORVASC) 10 MG tablet TAKE 1 TABLET (10 MG TOTAL) BY MOUTH DAILY. 90 tablet 11  . naproxen (NAPROSYN) 500 MG tablet Take 1 tablet (500 mg total) by mouth daily as needed for moderate pain. With food (Patient not taking: Reported on 08/16/2018) 60 tablet 2  . furosemide (LASIX) 20 MG tablet Take 1 tablet (20 mg total) by mouth daily for 14 days. 14 tablet 0  . rosuvastatin (CRESTOR) 40 MG tablet Take 1 tablet (40 mg total) by mouth daily. 90 tablet 2   No facility-administered medications prior to visit.     Allergies  Allergen Reactions  . Codeine Other (See Comments)    Headache  . Oxycodone Nausea Only and Palpitations  . Prednisone Other (See Comments)    Headache  . Tramadol Nausea Only      Objective:    BP 110/66 (BP Location: Left Arm, Patient Position: Sitting, Cuff Size: Large)   Pulse 94   Temp 98.6 F (37 C) (Oral)  Ht 5\' 6"  (1.676 m)   Wt 284 lb (128.8 kg)   SpO2 95%   BMI 45.84 kg/m  Wt Readings from Last 3 Encounters:  10/06/18 284 lb (128.8 kg)  07/01/18 230 lb (104.3 kg)  04/20/18 230 lb (104.3 kg)    Physical Exam Vitals signs and nursing note reviewed.  Constitutional:      Appearance: She is well-developed.  HENT:     Head: Normocephalic and atraumatic.  Neck:     Musculoskeletal: Normal range of motion.  Cardiovascular:     Rate and Rhythm: Normal rate and regular rhythm.     Heart sounds: Normal heart sounds. No murmur. No friction rub. No gallop.   Pulmonary:     Effort: Pulmonary effort is normal. No tachypnea or respiratory distress.     Breath sounds: Normal breath sounds. No decreased breath sounds, wheezing, rhonchi or rales.  Chest:     Chest wall: No tenderness.  Abdominal:     General: Bowel sounds are normal.     Palpations: Abdomen is soft.  Musculoskeletal: Normal range of motion.  Skin:    General: Skin is warm and dry.  Neurological:     Mental Status: She is alert and oriented to person, place,  and time.     Coordination: Coordination normal.  Psychiatric:        Behavior: Behavior normal. Behavior is cooperative.        Thought Content: Thought content normal.        Judgment: Judgment normal.          Patient has been counseled extensively about nutrition and exercise as well as the importance of adherence with medications and regular follow-up. The patient was given clear instructions to go to ER or return to medical center if symptoms don't improve, worsen or new problems develop. The patient verbalized understanding.   Follow-up: Return in about 4 weeks (around 11/03/2018).  Recheck BP  Gildardo Pounds, FNP-BC [facility] [place], [place]   10/07/2018, 1:26 PM

## 2018-10-07 ENCOUNTER — Encounter: Payer: Self-pay | Admitting: Nurse Practitioner

## 2018-10-07 MED ORDER — ROSUVASTATIN CALCIUM 40 MG PO TABS: 40.0000 mg | ORAL_TABLET | Freq: Every day | ORAL | 2 refills | Status: DC

## 2018-10-13 ENCOUNTER — Ambulatory Visit (HOSPITAL_COMMUNITY): Admission: RE | Admit: 2018-10-13 | Payer: Medicare Other | Source: Ambulatory Visit

## 2018-10-19 ENCOUNTER — Other Ambulatory Visit: Payer: Self-pay | Admitting: Nurse Practitioner

## 2018-10-19 ENCOUNTER — Other Ambulatory Visit (HOSPITAL_COMMUNITY): Payer: Self-pay | Admitting: Psychiatry

## 2018-10-19 DIAGNOSIS — G8929 Other chronic pain: Secondary | ICD-10-CM

## 2018-10-19 DIAGNOSIS — F411 Generalized anxiety disorder: Secondary | ICD-10-CM

## 2018-10-19 DIAGNOSIS — F319 Bipolar disorder, unspecified: Secondary | ICD-10-CM

## 2018-10-19 DIAGNOSIS — R600 Localized edema: Secondary | ICD-10-CM

## 2018-10-19 DIAGNOSIS — R0602 Shortness of breath: Secondary | ICD-10-CM

## 2018-10-30 ENCOUNTER — Ambulatory Visit: Payer: Medicare Other | Admitting: Pharmacist

## 2018-11-03 ENCOUNTER — Telehealth: Payer: Self-pay | Admitting: Nurse Practitioner

## 2018-11-03 ENCOUNTER — Encounter: Payer: Self-pay | Admitting: Pharmacist

## 2018-11-03 ENCOUNTER — Other Ambulatory Visit: Payer: Self-pay

## 2018-11-03 ENCOUNTER — Ambulatory Visit: Payer: Medicare Other | Attending: Nurse Practitioner | Admitting: Pharmacist

## 2018-11-03 ENCOUNTER — Other Ambulatory Visit: Payer: Self-pay | Admitting: Nurse Practitioner

## 2018-11-03 VITALS — BP 134/82 | HR 79

## 2018-11-03 DIAGNOSIS — I10 Essential (primary) hypertension: Secondary | ICD-10-CM | POA: Diagnosis not present

## 2018-11-03 MED ORDER — TRIAMCINOLONE ACETONIDE 0.1 % EX CREA: 1.0000 "application " | TOPICAL_CREAM | Freq: Two times a day (BID) | CUTANEOUS | 2 refills | Status: DC

## 2018-11-03 MED ORDER — MISC. DEVICES MISC: 0 refills | Status: DC

## 2018-11-03 MED ORDER — AMLODIPINE BESYLATE 10 MG PO TABS: 10.0000 mg | ORAL_TABLET | Freq: Every day | ORAL | 2 refills | Status: DC

## 2018-11-03 NOTE — Patient Instructions (Signed)

## 2018-11-03 NOTE — Progress Notes (Signed)
   S:    PCP: Zelda   Patient arrives in good spirits. Presents to the clinic for BP check.  Patient was referred and last seen by PCP on 10/06/18.    Patient reports adherence with medications but is taking differently than prescribed. She continues to take amlodipine even though this was discontinued. Additionally, she does not believe her Lasix is helping decrease LE edema and endorses compliance. She did not show for her Echo scheduled on 9/8.  Current BP Medications include:  Lisinopril-HCTZ 20-25 mg daily, Toprol-XL 25 mg daily  Dietary habits include:  - Non-compliant with salt restriction - Denies drinking caffeine  Exercise habits include:  - Reports walking daily but unable to provide an amount of time  Family / Social history:  - FHx: HTN (mother); DM (mother) - Former smoker (quit in 85) - Does not drink alcohol  O:  Home BP readings:  - Per pt, this varies  - Unable to give specific range but reports 120/80 taken yesterday  Last 3 Office BP readings: BP Readings from Last 3 Encounters:  10/06/18 110/66  09/14/18 129/85  08/16/18 124/71   BMET    Component Value Date/Time   NA 139 08/15/2018 2333   NA 144 06/15/2018 1032   K 3.6 08/15/2018 2333   CL 106 08/15/2018 2333   CO2 27 08/15/2018 2333   GLUCOSE 70 08/15/2018 2333   BUN 15 08/15/2018 2333   BUN 6 (L) 06/15/2018 1032   CREATININE 0.69 08/15/2018 2333   CREATININE 0.71 02/12/2016 1628   CALCIUM 8.9 08/15/2018 2333   GFRNONAA >60 08/15/2018 2333   GFRNONAA >89 02/12/2016 1628   GFRAA >60 08/15/2018 2333   GFRAA >89 02/12/2016 1628   Renal function: CrCl cannot be calculated (Patient's most recent lab result is older than the maximum 21 days allowed.).  Clinical ASCVD: Yes  The ASCVD Risk score Mikey Bussing DC Jr., et al., 2013) failed to calculate for the following reasons:   The patient has a prior MI or stroke diagnosis  A/P: Hypertension longstanding currently close to goal on current  medications. BP Goal = <130/80 mmHg. Patient is adherent with current medications but continues to take amlodipine. PCP saw patient briefly today. We will add back amlodipine to her list.  -Continued current regimen. -Add amlodipine back to medication list.  -Counseled on lifestyle modifications for blood pressure control including reduced dietary sodium, increased exercise, adequate sleep  Results reviewed and written information provided. Total time in face-to-face counseling 15 minutes.   F/U Clinic Visit in 2-3 weeks with Zelda.    Benard Halsted, PharmD, [place] [phone]

## 2018-11-03 NOTE — Telephone Encounter (Signed)
Pt called to request her medication resent to another pharmacy  1) Medication(s) Requested (by name): -Misc. Devices MISC  -amLODipine (NORVASC) 10 MG tablet  -triamcinolone cream (KENALOG) 0.1 %  2) Pharmacy of Choice: -[facility]  Phone-[phone] p   -Please change patients pharmacy to this location

## 2018-11-17 ENCOUNTER — Ambulatory Visit: Payer: Medicare Other | Admitting: Gastroenterology

## 2018-11-23 ENCOUNTER — Other Ambulatory Visit (HOSPITAL_COMMUNITY): Payer: Self-pay

## 2018-11-23 DIAGNOSIS — F411 Generalized anxiety disorder: Secondary | ICD-10-CM

## 2018-11-23 DIAGNOSIS — F319 Bipolar disorder, unspecified: Secondary | ICD-10-CM

## 2018-11-23 MED ORDER — ZIPRASIDONE HCL 80 MG PO CAPS: 80.0000 mg | ORAL_CAPSULE | Freq: Every day | ORAL | 0 refills | Status: DC

## 2018-11-23 MED ORDER — TRAZODONE HCL 150 MG PO TABS: 150.0000 mg | ORAL_TABLET | Freq: Every day | ORAL | 0 refills | Status: DC

## 2018-11-23 MED ORDER — DULOXETINE HCL 60 MG PO CPEP: 60.0000 mg | ORAL_CAPSULE | Freq: Every day | ORAL | 0 refills | Status: DC

## 2018-12-02 ENCOUNTER — Ambulatory Visit (INDEPENDENT_AMBULATORY_CARE_PROVIDER_SITE_OTHER): Payer: Medicare Other | Admitting: Psychiatry

## 2018-12-02 ENCOUNTER — Other Ambulatory Visit: Payer: Self-pay

## 2018-12-02 ENCOUNTER — Encounter (HOSPITAL_COMMUNITY): Payer: Self-pay | Admitting: Psychiatry

## 2018-12-02 DIAGNOSIS — F319 Bipolar disorder, unspecified: Secondary | ICD-10-CM | POA: Diagnosis not present

## 2018-12-02 DIAGNOSIS — F411 Generalized anxiety disorder: Secondary | ICD-10-CM | POA: Diagnosis not present

## 2018-12-02 MED ORDER — ZIPRASIDONE HCL 80 MG PO CAPS: 80.0000 mg | ORAL_CAPSULE | Freq: Every day | ORAL | 0 refills | Status: DC

## 2018-12-02 MED ORDER — TRAZODONE HCL 100 MG PO TABS: 100.0000 mg | ORAL_TABLET | Freq: Every day | ORAL | 0 refills | Status: DC

## 2018-12-02 MED ORDER — DULOXETINE HCL 60 MG PO CPEP: 60.0000 mg | ORAL_CAPSULE | Freq: Every day | ORAL | 0 refills | Status: DC

## 2018-12-02 NOTE — Progress Notes (Signed)
Virtual Visit via Telephone Note  I connected with Lorraine Ryan on 12/02/18 at  8:20 AM EDT by telephone and verified that I am speaking with the correct person using two identifiers.   I discussed the limitations, risks, security and privacy concerns of performing an evaluation and management service by telephone and the availability of in person appointments. I also discussed with the patient that there may be a patient responsible charge related to this service. The patient expressed understanding and agreed to proceed.   History of Present Illness: Patient was evaluated by phone session.  Patient complaining of multiple health issues.  She reported fluid in her legs and recently she spoke to her primary care physician.  She also endorsed sleeping too much.  Her living situation remain the same.  She lives with her son and so far unable to find her own place.  She is also having issues with her brother.  We have increase Geodon and trazodone 6 months ago.  Now she is complaining sleeping too much.  She believes that she is taking too many medication and that causing excessive sleep.  In the morning she is tired and has no energy.  She is still hearing voices but sometimes auditory and visual.  She complains people calling in some time she is seeing shadows but they are chronic and has not changed.  She admitted her irritability and anger is much better since the Geodon was increased.  She has no tremors, shakes or any EPS.  Her appetite is okay.  She reported her weight is unchanged from the past.  She denies any suicidal thoughts or homicidal thoughts.  She denies drinking or using any illegal substances.  She is not interested in therapy.  Past Psychiatric History:Reviewed. No h/o inpatient treatment or any suicidal attempt. H/O paranoia, hallucination, mania, anger and drug use. Seen in the past at [facility] and then [facility] and prescribe Latuda but made sick. Tried Abilify but it was switched  to Geodon. No h/o abuse, no history of panic attacks.    Psychiatric Specialty Exam: Physical Exam  ROS  There were no vitals taken for this visit.There is no height or weight on file to calculate BMI.  General Appearance: NA  Eye Contact:  NA  Speech:  Slow  Volume:  Decreased  Mood:  Euthymic  Affect:  NA  Thought Process:  Descriptions of Associations: Intact  Orientation:  Full (Time, Place, and Person)  Thought Content:  Hallucinations: Auditory Visual seeing and hearing people voices and Paranoid Ideation  Suicidal Thoughts:  No  Homicidal Thoughts:  No  Memory:  Immediate;   Fair Recent;   Fair Remote;   Fair  Judgement:  Fair  Insight:  Present  Psychomotor Activity:  NA  Concentration:  Concentration: Fair and Attention Span: Fair  Recall:  [employer] of Knowledge:  Good  Language:  Good  Akathisia:  No  Handed:  Right  AIMS (if indicated):     Assets:  Communication Skills Desire for Improvement Resilience  ADL's:  Intact  Cognition:  WNL  Sleep:   sleeping too much      Assessment and Plan: Bipolar disorder type I.  Generalized anxiety disorder.  Discuss her excessive sleep.  Review other medication.  She is taking gabapentin and pain medicine.  I recommend to cut down her trazodone back to 100 mg at bedtime.  Continue Geodon 80 mg which is helping her paranoia and mood.  Continue Cymbalta 60 mg which  is helping her anxiety and have some advantage on her chronic pain.  One more time I encouraged to see a therapist but patient refused.  She believe her biggest concern is living situation.  She is hoping to have her own place in the future.  Discussed medication side effects and benefits.  Recommended to call us back if she has any question or any concern.  Follow-up in 3 months.  Follow Up Instructions:    I discussed the assessment and treatment plan with the patient. The patient was provided an opportunity to ask questions and all were answered. The  patient agreed with the plan and demonstrated an understanding of the instructions.   The patient was advised to call back or seek an in-person evaluation if the symptoms worsen or if the condition fails to improve as anticipated.  I provided 20 minutes of non-face-to-face time during this encounter.   Kathlee Nations, MD

## 2018-12-09 ENCOUNTER — Telehealth: Payer: Self-pay | Admitting: Nurse Practitioner

## 2018-12-09 NOTE — Telephone Encounter (Signed)
Patient called stating her leg is still swollen and has been putting her cream on and her skin is still breaking out. Please follow up.

## 2018-12-10 NOTE — Telephone Encounter (Signed)
Advised patient to   -limit salt intake- She does not feel she takes in that much salt.   -elevate feet and legs- Unable to do so because she does not have a place to stay. Does not have her own place and is unable to do this in someone's house. " I have to keep moving I can't lay up in someone else's place all day".   - keep a log of her weights for her next appointment- Does not have a place to keep her scale. In storage.    Scheduled an appointment for January 15, 2019 with PCP Informed she could call on Monday to schedule an appointment with the Walk-in provider. If swelling and pain worsens, can go to UC or ED.    See that her Furosemide was good for 30 doses. Denies worsening SOB or new onset cough.   Patient verbalized understanding.

## 2018-12-11 NOTE — Telephone Encounter (Signed)
Noted  

## 2018-12-19 ENCOUNTER — Emergency Department (HOSPITAL_COMMUNITY): Payer: Medicare Other

## 2018-12-19 ENCOUNTER — Other Ambulatory Visit: Payer: Self-pay

## 2018-12-19 ENCOUNTER — Emergency Department (HOSPITAL_COMMUNITY)
Admission: EM | Admit: 2018-12-19 | Discharge: 2018-12-19 | Disposition: A | Discharge status: Home / Self Care | Payer: Medicare Other | Attending: Emergency Medicine | Admitting: Emergency Medicine

## 2018-12-19 DIAGNOSIS — N3001 Acute cystitis with hematuria: Secondary | ICD-10-CM | POA: Diagnosis not present

## 2018-12-19 DIAGNOSIS — R319 Hematuria, unspecified: Secondary | ICD-10-CM | POA: Diagnosis not present

## 2018-12-19 DIAGNOSIS — Z79899 Other long term (current) drug therapy: Secondary | ICD-10-CM | POA: Insufficient documentation

## 2018-12-19 DIAGNOSIS — F0390 Unspecified dementia without behavioral disturbance: Secondary | ICD-10-CM | POA: Diagnosis not present

## 2018-12-19 DIAGNOSIS — I10 Essential (primary) hypertension: Secondary | ICD-10-CM | POA: Diagnosis not present

## 2018-12-19 DIAGNOSIS — E119 Type 2 diabetes mellitus without complications: Secondary | ICD-10-CM | POA: Diagnosis not present

## 2018-12-19 DIAGNOSIS — Z8673 Personal history of transient ischemic attack (TIA), and cerebral infarction without residual deficits: Secondary | ICD-10-CM | POA: Diagnosis not present

## 2018-12-19 DIAGNOSIS — Z87891 Personal history of nicotine dependence: Secondary | ICD-10-CM | POA: Diagnosis not present

## 2018-12-19 LAB — URINALYSIS, ROUTINE W REFLEX MICROSCOPIC
Bilirubin Urine: NEGATIVE
Glucose, UA: NEGATIVE mg/dL
Ketones, ur: NEGATIVE mg/dL
Nitrite: NEGATIVE
Protein, ur: 100 mg/dL — AB
RBC / HPF: >50 RBC/hpf — ABNORMAL HIGH (ref 0–5)
Specific Gravity, Urine: 1.021 (ref 1.005–1.030)
WBC, UA: >50 WBC/hpf — ABNORMAL HIGH (ref 0–5)
pH: 6 (ref 5.0–8.0)

## 2018-12-19 LAB — BASIC METABOLIC PANEL
Anion gap: 10 (ref 5–15)
BUN: 6 mg/dL — ABNORMAL LOW (ref 8–23)
CO2: 25 mmol/L (ref 22–32)
Calcium: 9.1 mg/dL (ref 8.9–10.3)
Chloride: 105 mmol/L (ref 98–111)
Creatinine, Ser: 0.81 mg/dL (ref 0.44–1.00)
GFR calc Af Amer: >60 mL/min (ref >60)
GFR calc non Af Amer: >60 mL/min (ref >60)
Glucose, Bld: 93 mg/dL (ref 70–99)
Potassium: 3.6 mmol/L (ref 3.5–5.1)
Sodium: 140 mmol/L (ref 135–145)

## 2018-12-19 LAB — CBC
HCT: 41.3 % (ref 36.0–46.0)
Hemoglobin: 13.2 g/dL (ref 12.0–15.0)
MCH: 29.7 pg (ref 26.0–34.0)
MCHC: 32 g/dL (ref 30.0–36.0)
MCV: 92.8 fL (ref 80.0–100.0)
Platelets: 253 10*3/uL (ref 150–400)
RBC: 4.45 MIL/uL (ref 3.87–5.11)
RDW: 13.1 % (ref 11.5–15.5)
WBC: 5.4 10*3/uL (ref 4.0–10.5)
nRBC: 0 % (ref 0.0–0.2)

## 2018-12-19 LAB — BRAIN NATRIURETIC PEPTIDE: B Natriuretic Peptide: 20.5 pg/mL (ref 0.0–100.0)

## 2018-12-19 MED ORDER — CEPHALEXIN 500 MG PO CAPS: 500.0000 mg | ORAL_CAPSULE | Freq: Four times a day (QID) | ORAL | 0 refills | Status: AC

## 2018-12-19 NOTE — ED Triage Notes (Addendum)
Pt to ED, reports blood in urine that started today, also reports pain and frequency with urination. Reports lower back pain

## 2018-12-19 NOTE — ED Notes (Signed)
Patient verbalizes understanding of discharge instructions. Opportunity for questioning and answers were provided. Armband removed by staff, pt discharged from ED.  

## 2018-12-19 NOTE — ED Provider Notes (Signed)
[facility] EMERGENCY DEPARTMENT Provider Note   CSN: [id number] Arrival date & time: 12/19/18  1328     History   Chief Complaint Chief Complaint  Patient presents with  . Hematuria    HPI Lorraine Ryan is a 63 y.o. female.     HPI   Patient presents today for multiple complaints including hematuria that has been present for the past one 1 day, with mild suprapubic abdominal pain and increased urinary frequency.  She also reports lower back pain that she suspects might be "muscle spraining".  She states that she has had a generalized malaise feeling that has caused her to have difficulty ambulating when she is at her worst over the last couple of days.  No other associated symptoms is that patient has had bilateral lower extremity edema that has been present for several months.  She denies any fevers, chills, sweats, current chest pain or shortness of breath.  Nothing is made her symptoms better or worse.  This is happened one time before at which time she was diagnosed with a UTI.  Patient had a hysterectomy years ago in the 90s, and has not had a menstrual period since then.  Past Medical History:  Diagnosis Date  . Anxiety   . Arthritis   . Back pain   . Bipolar disorder ([place])   . Chronic headaches   . Dementia ([place])   . Depression   . Diabetes mellitus without complication ([place])   . GERD (gastroesophageal reflux disease)   . Hypertension    No meds prescribed; states intermittent  . Neuromuscular disorder ([place])   . Shortness of breath dyspnea   . Stroke [facility])    caused numbness in leg    Patient Active Problem List   Diagnosis Date Noted  . Acute pain of right shoulder 03/03/2018  . Muscle spasm 03/03/2018  . Frequent PVCs 12/29/2017  . History of fall 10/23/2017  . Depression   . Anxiety   . Abdominal pain, epigastric 03/30/2015  . Dysphagia 03/30/2015  . History of lumbar laminectomy for spinal cord decompression 02/08/2015  .  Hyperlipidemia 04/20/2014  . Chronic midline low back pain without sciatica 03/01/2013  . Numbness in right leg 03/01/2013  . Chest pain on exertion 03/01/2013  . Hypertension   . Stroke [facility])     Past Surgical History:  Procedure Laterality Date  . ABDOMINAL HYSTERECTOMY     no cervix  . COLONOSCOPY WITH PROPOFOL N/A 04/18/2015   [id number] hemorrhoids/diverticulosis sigmoid colon/  . ESOPHAGOGASTRODUODENOSCOPY (EGD) WITH PROPOFOL N/A 04/18/2015   SLF:web in the proximal esophagus/dilated/  . GANGLION CYST EXCISION    . LUMBAR LAMINECTOMY/DECOMPRESSION MICRODISCECTOMY N/A 02/08/2015   Procedure: L4-5 Decompression;  Surgeon: Marybelle Killings, MD;  Location: [place];  Service: Orthopedics;  Laterality: N/A;  . POLYPECTOMY  04/18/2015   Procedure: POLYPECTOMY;  Surgeon: Danie Binder, MD;  [facility];  Service: Endoscopy;;  ascending colon polyp  . TONSILLECTOMY       OB History   No obstetric history on file.      Home Medications    Prior to Admission medications   Medication Sig Start Date End Date Taking? Authorizing Provider  amLODipine (NORVASC) 10 MG tablet Take 1 tablet (10 mg total) by mouth daily. 11/03/18   Gildardo Pounds, NP  cephALEXin (KEFLEX) 500 MG capsule Take 1 capsule (500 mg total) by mouth 4 (four) times daily for 10 days. 12/19/18 12/29/18  Julianne Rice,  MD  clotrimazole (LOTRIMIN) 1 % cream Apply to affected area 2 times daily 08/16/18   Caccavale, Sophia, PA-C  DULoxetine (CYMBALTA) 60 MG capsule Take 1 capsule (60 mg total) by mouth daily. 12/02/18   Arfeen, Arlyce Harman, MD  furosemide (LASIX) 20 MG tablet Take 1 tablet (20 mg total) by mouth daily. 10/06/18 11/05/18  Gildardo Pounds, NP  gabapentin (NEURONTIN) 300 MG capsule TAKE 1 CAPSULE BY MOUTH THREE TIMES DAILY 09/12/18   Gildardo Pounds, NP  lisinopril-hydrochlorothiazide (ZESTORETIC) 20-25 MG tablet TAKE 1 TABLET BY MOUTH DAILY. 09/22/18   Gildardo Pounds, NP  metoprolol succinate (TOPROL XL)  25 MG 24 hr tablet Take 1 tablet (25 mg total) by mouth daily. 11/28/17   Fay Records, MD  Misc. Devices MISC Please provide patient with insurance approved custom compression stockings 11/03/18   Charlott Rakes, MD  naproxen (NAPROSYN) 500 MG tablet Take 1 tablet (500 mg total) by mouth daily as needed for moderate pain. With food Patient not taking: Reported on 08/16/2018 06/12/18   Gildardo Pounds, NP  rosuvastatin (CRESTOR) 40 MG tablet Take 1 tablet (40 mg total) by mouth daily. 10/07/18 01/05/19  Gildardo Pounds, NP  tiZANidine (ZANAFLEX) 4 MG tablet TAKE 1 TABLET BY MOUTH EVERY 6 HOURS AS NEEDED FOR MUSCLE SPASMS 09/22/18   Gildardo Pounds, NP  traZODone (DESYREL) 100 MG tablet Take 1 tablet (100 mg total) by mouth at bedtime. 12/02/18   Arfeen, Arlyce Harman, MD  triamcinolone cream (KENALOG) 0.1 % Apply 1 application topically 2 (two) times daily. To right leg 11/03/18   Gildardo Pounds, NP  ziprasidone (GEODON) 80 MG capsule Take 1 capsule (80 mg total) by mouth daily. Take with food 12/02/18   Arfeen, Arlyce Harman, MD    Family History Family History  Problem Relation Age of Onset  . Diabetes Mother   . Hypertension Mother   . Heart disease Other        No family history  . Breast cancer Sister        diagnosed in her 63's  . Anxiety disorder Sister   . Depression Sister   . Anxiety disorder Brother   . Depression Brother   . Anxiety disorder Brother   . Depression Brother   . Cancer Maternal Grandmother        unknown type  . Colon cancer Neg Hx     Social History Social History   Tobacco Use  . Smoking status: Former Smoker    Packs/day: 0.50    Years: 10.00    Pack years: 5.00    Types: Cigarettes    Quit date: 03/29/1985    Years since quitting: 33.7  . Smokeless tobacco: Never Used  Substance Use Topics  . Alcohol use: No    Alcohol/week: 0.0 standard drinks    Comment: Used to drink heavily, no ETOH in "30-some years"  . Drug use: No    Comment: History of crack, "no  drugs in 30-some years"     Allergies   Codeine, Oxycodone, Prednisone, and Tramadol   Review of Systems Review of Systems  Constitutional: Negative for fever.  Respiratory: Negative for cough and shortness of breath.   Gastrointestinal: Positive for abdominal pain. Negative for nausea and vomiting.  Skin: Negative for rash and wound.  All other systems reviewed and are negative.    Physical Exam Updated Vital Signs BP (!) 166/101   Pulse (!) 102   Temp 98.8 F (37.1  C) (Oral)   Resp 18   SpO2 96%   Physical Exam Vitals signs and nursing note reviewed.  Constitutional:      Appearance: She is well-developed. She is obese. She is not ill-appearing.  HENT:     Head: Normocephalic and atraumatic.     Mouth/Throat:     Mouth: Mucous membranes are moist.  Eyes:     Conjunctiva/sclera: Conjunctivae normal.  Neck:     Musculoskeletal: Neck supple.  Cardiovascular:     Rate and Rhythm: Normal rate and regular rhythm.     Pulses: Normal pulses.  Pulmonary:     Effort: Pulmonary effort is normal. No respiratory distress.     Breath sounds: Normal breath sounds.     Comments: Lung sounds clear bilaterally Abdominal:     General: There is no distension.     Palpations: Abdomen is soft.     Tenderness: There is abdominal tenderness.     Comments: Mild suprapubic tenderness to palpation, no rebound or guarding present  Genitourinary:    Comments: Pelvic exam refused by patient Skin:    General: Skin is warm and dry.     Comments: Bilateral lower extremity lymphedema, with pitting edema present in bilateral lower feet  Neurological:     General: No focal deficit present.     Mental Status: She is alert and oriented to person, place, and time.     Comments: Able to ambulate without difficulty or without assistance      ED Treatments / Results  Labs (all labs ordered are listed, but only abnormal results are displayed) Labs Reviewed  URINALYSIS, ROUTINE W REFLEX  MICROSCOPIC - Abnormal; Notable for the following components:      Result Value   APPearance HAZY (*)    Hgb urine dipstick LARGE (*)    Protein, ur 100 (*)    Leukocytes,Ua SMALL (*)    RBC / HPF >50 (*)    WBC, UA >50 (*)    Bacteria, UA RARE (*)    All other components within normal limits  BASIC METABOLIC PANEL - Abnormal; Notable for the following components:   BUN 6 (*)    All other components within normal limits  URINE CULTURE  CBC  BRAIN NATRIURETIC PEPTIDE    EKG None  Radiology Dg Chest Portable 1 View  Result Date: 12/19/2018 CLINICAL DATA:  Hematuria EXAM: PORTABLE CHEST 1 VIEW COMPARISON:  08/15/2018 FINDINGS: Scarring or atelectasis at the lingula. No pleural effusion. Heart size upper limits of normal. Negative for overt edema. IMPRESSION: No active disease.  Mild scarring or atelectasis at the lingula. Electronically Signed   By: Donavan Foil M.D.   On: 12/19/2018 16:05    Procedures Procedures (including critical care time)  Medications Ordered in ED Medications - No data to display   Initial Impression / Assessment and Plan / ED Course  I have reviewed the triage vital signs and the nursing notes.  Pertinent labs & imaging results that were available during my care of the patient were reviewed by me and considered in my medical decision making (see chart for details).        Lorraine Ryan is a 63 y.o. female with a past medical history of arthritis, GERD, diabetes, hypertension presenting today for bilateral lower extremity lymphedema that has been present for several months and well documented in chart review.  Patient had a echo planned by her PCP, but she did not show up per chart review.  She  is encouraged to follow-up with her PCP and get a cardiology echo as well as further cardiology work-up.  Labs and imaging were ordered for differential diagnosis of sepsis, UTI, pulmonary edema, volume overload.  Labs not show significant leukocytosis, UTI  present, no pulmonary edema present on chest x-ray, BNP normal.  At this time, feel patient is stable for discharge with PCP follow-up regarding lymphedema.  NSAIDs recommended for muscle pains.  Patient is encouraged to follow-up for cardiology work-up, but do not feel that she needs any emergent labs or imaging in the emergency department that was not already done.  Doubt current ACS, aortic dissection, other emergent pathology.  Cephalosporin prescribed for UTI.  Care of patient discussed with the supervising attending.  Final Clinical Impressions(s) / ED Diagnoses   Final diagnoses:  Acute cystitis with hematuria    ED Discharge Orders         Ordered    cephALEXin (KEFLEX) 500 MG capsule  4 times daily     12/19/18 1646           Julianne Rice, MD 12/19/18 1727    Little, Wenda Overland, MD 12/20/18 1710

## 2018-12-20 LAB — URINE CULTURE: Culture: NO GROWTH

## 2018-12-25 ENCOUNTER — Other Ambulatory Visit: Payer: Self-pay | Admitting: Nurse Practitioner

## 2018-12-25 DIAGNOSIS — G8929 Other chronic pain: Secondary | ICD-10-CM

## 2018-12-30 ENCOUNTER — Telehealth: Payer: Self-pay | Admitting: Nurse Practitioner

## 2018-12-30 DIAGNOSIS — M545 Low back pain, unspecified: Secondary | ICD-10-CM

## 2018-12-30 DIAGNOSIS — G8929 Other chronic pain: Secondary | ICD-10-CM

## 2018-12-30 MED ORDER — TRIAMCINOLONE ACETONIDE 0.1 % EX CREA: 1.0000 "application " | TOPICAL_CREAM | Freq: Two times a day (BID) | CUTANEOUS | 0 refills | Status: DC

## 2018-12-30 MED ORDER — TIZANIDINE HCL 4 MG PO TABS: ORAL_TABLET | ORAL | 0 refills | Status: DC

## 2018-12-30 MED ORDER — NAPROXEN 500 MG PO TABS: 500.0000 mg | ORAL_TABLET | Freq: Every day | ORAL | 0 refills | Status: DC | PRN

## 2018-12-30 NOTE — Telephone Encounter (Signed)
1) Medication(s) Requested (by name): triamcinolone cream (KENALOG) 0.1 %  tiZANidine (ZANAFLEX) 4 MG tablet naproxen (NAPROSYN) 500 MG tablet    2) Pharmacy of Choice:  Mail Order [facility] [address], [place] [address]  3) Special Requests:   Approved medications will be sent to the pharmacy, we will reach out if there is an issue.  Requests made after 3pm may not be addressed until the following business day!  If a patient is unsure of the name of the medication(s) please note and ask patient to call back when they are able to provide all info, do not send to responsible party until all information is available!

## 2018-12-30 NOTE — Telephone Encounter (Signed)
She needs to be seen and has an appointment next month. Will send 1 month supply to hold her over until then.

## 2019-01-11 ENCOUNTER — Other Ambulatory Visit (HOSPITAL_COMMUNITY): Payer: Self-pay | Admitting: Psychiatry

## 2019-01-11 DIAGNOSIS — F319 Bipolar disorder, unspecified: Secondary | ICD-10-CM

## 2019-01-15 ENCOUNTER — Ambulatory Visit: Payer: Medicare Other | Attending: Nurse Practitioner | Admitting: Nurse Practitioner

## 2019-01-15 ENCOUNTER — Other Ambulatory Visit: Payer: Self-pay

## 2019-01-19 ENCOUNTER — Other Ambulatory Visit: Payer: Self-pay | Admitting: Family Medicine

## 2019-01-19 DIAGNOSIS — G8929 Other chronic pain: Secondary | ICD-10-CM

## 2019-01-19 DIAGNOSIS — M25511 Pain in right shoulder: Secondary | ICD-10-CM

## 2019-01-20 ENCOUNTER — Other Ambulatory Visit (HOSPITAL_COMMUNITY): Payer: Self-pay | Admitting: Psychiatry

## 2019-01-20 DIAGNOSIS — F411 Generalized anxiety disorder: Secondary | ICD-10-CM

## 2019-01-20 DIAGNOSIS — F319 Bipolar disorder, unspecified: Secondary | ICD-10-CM

## 2019-01-22 ENCOUNTER — Other Ambulatory Visit (HOSPITAL_COMMUNITY): Payer: Self-pay | Admitting: Psychiatry

## 2019-01-22 DIAGNOSIS — F411 Generalized anxiety disorder: Secondary | ICD-10-CM

## 2019-02-23 ENCOUNTER — Other Ambulatory Visit: Payer: Self-pay | Admitting: Family Medicine

## 2019-02-23 ENCOUNTER — Other Ambulatory Visit (HOSPITAL_COMMUNITY): Payer: Self-pay | Admitting: Psychiatry

## 2019-02-23 DIAGNOSIS — F319 Bipolar disorder, unspecified: Secondary | ICD-10-CM

## 2019-02-23 DIAGNOSIS — G8929 Other chronic pain: Secondary | ICD-10-CM

## 2019-02-27 ENCOUNTER — Other Ambulatory Visit: Payer: Self-pay | Admitting: Family Medicine

## 2019-02-27 ENCOUNTER — Other Ambulatory Visit: Payer: Self-pay | Admitting: Nurse Practitioner

## 2019-02-27 DIAGNOSIS — R0602 Shortness of breath: Secondary | ICD-10-CM

## 2019-02-27 DIAGNOSIS — G8929 Other chronic pain: Secondary | ICD-10-CM

## 2019-02-27 DIAGNOSIS — R600 Localized edema: Secondary | ICD-10-CM

## 2019-02-28 MED ORDER — ROSUVASTATIN CALCIUM 40 MG PO TABS: 40.0000 mg | ORAL_TABLET | Freq: Every day | ORAL | 2 refills | Status: DC

## 2019-02-28 MED ORDER — TIZANIDINE HCL 4 MG PO TABS: ORAL_TABLET | ORAL | 0 refills | Status: DC

## 2019-02-28 MED ORDER — FUROSEMIDE 20 MG PO TABS: 20.0000 mg | ORAL_TABLET | Freq: Every day | ORAL | 1 refills | Status: DC

## 2019-03-04 ENCOUNTER — Other Ambulatory Visit: Payer: Self-pay

## 2019-03-04 ENCOUNTER — Ambulatory Visit (INDEPENDENT_AMBULATORY_CARE_PROVIDER_SITE_OTHER): Payer: Medicare Other | Admitting: Psychiatry

## 2019-03-04 ENCOUNTER — Encounter (HOSPITAL_COMMUNITY): Payer: Self-pay | Admitting: Psychiatry

## 2019-03-04 DIAGNOSIS — F411 Generalized anxiety disorder: Secondary | ICD-10-CM | POA: Diagnosis not present

## 2019-03-04 DIAGNOSIS — F319 Bipolar disorder, unspecified: Secondary | ICD-10-CM | POA: Diagnosis not present

## 2019-03-04 MED ORDER — DULOXETINE HCL 60 MG PO CPEP: 60.0000 mg | ORAL_CAPSULE | Freq: Every day | ORAL | 0 refills | Status: DC

## 2019-03-04 MED ORDER — TRAZODONE HCL 100 MG PO TABS: 100.0000 mg | ORAL_TABLET | Freq: Every day | ORAL | 0 refills | Status: DC

## 2019-03-04 MED ORDER — ZIPRASIDONE HCL 80 MG PO CAPS: 80.0000 mg | ORAL_CAPSULE | Freq: Every day | ORAL | 0 refills | Status: DC

## 2019-03-04 NOTE — Progress Notes (Signed)
Virtual Visit via Telephone Note  I connected with Lorraine Ryan on 03/04/19 at 10:00 AM EST by telephone and verified that I am speaking with the correct person using two identifiers.   I discussed the limitations, risks, security and privacy concerns of performing an evaluation and management service by telephone and the availability of in person appointments. I also discussed with the patient that there may be a patient responsible charge related to this service. The patient expressed understanding and agreed to proceed.   History of Present Illness: Patient was evaluated by phone session.  She reported her Christmas was quiet.  She is now living by herself as her son moved out.  Sometimes she gets food but happy that occasionally brother called to check on her.  In the past she has issues with her brother.  She is taking trazodone, Geodon and Cymbalta.  She denies any highs and lows.  She denies any suicidal thoughts but continues to have visual and auditory hallucination that people call her name and sometimes seeing shadows but they are not as bad.  Her sleep is good.  She has no tremors, shakes or any EPS.  She admitted occasionally feels dizziness and not sure if her sugar is low.  In November she was in the emergency room because of abdominal pain and UTI symptoms.  She admitted not seen primary care physician in a while for blood sugar.  She is hoping to get a glucometer so she can check sugars periodically.  She is not on any diabetic medication but believes her sugar is high.  She denies any irritability, anger, suicidal thoughts.  Her appetite is okay.  She has multiple health issues and sometimes she worried about her health.  She is not interested in therapy.  She like to continue current medication.   Past Psychiatric History:Reviewed. No h/o inpatient treatment or any suicidal attempt. H/O paranoia, hallucination, mania, anger and drug use. Seen in the past at [facility] and then  [facility] and prescribe Latuda but made sick. Tried Abilify but it was switched to Geodon. No h/o abuse, no history of panic attacks.  Recent Results (from the past 2160 hour(s))  CBC     Status: None   Collection Time: 12/19/18  1:53 PM  Result Value Ref Range   WBC 5.4 4.0 - 10.5 K/uL   RBC 4.45 3.87 - 5.11 MIL/uL   Hemoglobin 13.2 12.0 - 15.0 g/dL   HCT 41.3 36.0 - 46.0 %   MCV 92.8 80.0 - 100.0 fL   MCH 29.7 26.0 - 34.0 pg   MCHC 32.0 30.0 - 36.0 g/dL   RDW 13.1 11.5 - 15.5 %   Platelets 253 150 - 400 K/uL   nRBC 0.0 0.0 - 0.2 %    Comment: Performed at [facility], [place] [address] [place], [place] [id number]  Basic metabolic panel     Status: Abnormal   Collection Time: 12/19/18  1:53 PM  Result Value Ref Range   Sodium 140 135 - 145 mmol/L   Potassium 3.6 3.5 - 5.1 mmol/L   Chloride 105 98 - 111 mmol/L   CO2 25 22 - 32 mmol/L   Glucose, Bld 93 70 - 99 mg/dL   BUN 6 (L) 8 - 23 mg/dL   Creatinine, Ser 0.81 0.44 - 1.00 mg/dL   Calcium 9.1 8.9 - 10.3 mg/dL   GFR calc non Af Amer >60 >60 mL/min   GFR calc Af Amer >60 >60 mL/min  Anion gap 10 5 - 15    Comment: Performed at [facility] [address] [place], [place] [address]  Brain natriuretic peptide     Status: None   Collection Time: 12/19/18  1:53 PM  Result Value Ref Range   B Natriuretic Peptide 20.5 0.0 - 100.0 pg/mL    Comment: Performed at [place] [facility] [address], [place], [place] [address]  Urinalysis, Routine w reflex microscopic- may I&O cath if menses     Status: Abnormal   Collection Time: 12/19/18  1:54 PM  Result Value Ref Range   Color, Urine YELLOW YELLOW   APPearance HAZY (A) CLEAR   Specific Gravity, Urine 1.021 1.005 - 1.030   pH 6.0 5.0 - 8.0   Glucose, UA NEGATIVE NEGATIVE mg/dL   Hgb urine dipstick LARGE (A) NEGATIVE   Bilirubin Urine NEGATIVE NEGATIVE   Ketones, ur NEGATIVE NEGATIVE mg/dL   Protein, ur 100 (A) NEGATIVE mg/dL   Nitrite NEGATIVE NEGATIVE    Leukocytes,Ua SMALL (A) NEGATIVE   RBC / HPF >50 (H) 0 - 5 RBC/hpf   WBC, UA >50 (H) 0 - 5 WBC/hpf   Bacteria, UA RARE (A) NONE SEEN   Squamous Epithelial / LPF 0-5 0 - 5   Mucus PRESENT     Comment: Performed at [facility], [place] [address], [place], [place] [address]  Urine culture     Status: None   Collection Time: 12/19/18  4:09 PM   Specimen: Urine, Clean Catch  Result Value Ref Range   Specimen Description URINE, CLEAN CATCH    Special Requests NONE    Culture      NO GROWTH Performed at [facility], [place] [address] [place], [place] [address]    Report Status 12/20/2018 FINAL       Psychiatric Specialty Exam: Physical Exam  Review of Systems  There were no vitals taken for this visit.There is no height or weight on file to calculate BMI.  General Appearance: NA  Eye Contact:  NA  Speech:  Clear and Coherent and Slow  Volume:  Decreased  Mood:  Euthymic  Affect:  NA  Thought Process:  Descriptions of Associations: Intact  Orientation:  Full (Time, Place, and Person)  Thought Content:  Hallucinations: Auditory Visual people calling her name and seeing shadows  Suicidal Thoughts:  No  Homicidal Thoughts:  No  Memory:  Immediate;   Fair Recent;   Fair Remote;   Fair  Judgement:  Fair  Insight:  Present  Psychomotor Activity:  NA  Concentration:  Concentration: Fair and Attention Span: Fair  Recall:  [employer] of Knowledge:  Fair  Language:  Good  Akathisia:  No  Handed:  Right  AIMS (if indicated):     Assets:  Communication Skills Desire for Improvement Housing  ADL's:  Intact  Cognition:  WNL  Sleep:   ok      Assessment and Plan: Bipolar disorder type I.  Generalized anxiety disorder.  I encouraged she should contact her PCP for occasional dizziness and may need to have comprehensive metabolic panel including hemoglobin A1c she believes her sugar is high.  She is tolerating better lower dose of trazodone as she is sleeping good.   She is also on pain medication.  Discussed polypharmacy especially drug interaction with psychotropic medication.  I encouraged to see therapist but patient refused.  Now she is living by herself she feels, but also boredom.  She like to continue current medication.  I will continue Geodon 80 mg daily, trazodone 100 mg at bedtime and Cymbalta 60 mg daily.  Recommended to call us back if she is any question or any concern.  Follow-up in 3 months.  Follow Up Instructions:    I discussed the assessment and treatment plan with the patient. The patient was provided an opportunity to ask questions and all were answered. The patient agreed with the plan and demonstrated an understanding of the instructions.   The patient was advised to call back or seek an in-person evaluation if the symptoms worsen or if the condition fails to improve as anticipated.  I provided 20 minutes of non-face-to-face time during this encounter.   Kathlee Nations, MD

## 2019-03-11 ENCOUNTER — Other Ambulatory Visit: Payer: Self-pay | Admitting: Pharmacist

## 2019-03-11 ENCOUNTER — Other Ambulatory Visit: Payer: Self-pay

## 2019-03-11 ENCOUNTER — Ambulatory Visit: Payer: Medicare Other | Attending: Nurse Practitioner | Admitting: Physician Assistant

## 2019-03-11 VITALS — BP 160/90 | HR 98 | Temp 97.8°F | Resp 16 | Ht 67.0 in | Wt 290.0 lb

## 2019-03-11 DIAGNOSIS — G8929 Other chronic pain: Secondary | ICD-10-CM

## 2019-03-11 DIAGNOSIS — I10 Essential (primary) hypertension: Secondary | ICD-10-CM

## 2019-03-11 DIAGNOSIS — R600 Localized edema: Secondary | ICD-10-CM | POA: Diagnosis not present

## 2019-03-11 DIAGNOSIS — E785 Hyperlipidemia, unspecified: Secondary | ICD-10-CM

## 2019-03-11 DIAGNOSIS — F319 Bipolar disorder, unspecified: Secondary | ICD-10-CM

## 2019-03-11 DIAGNOSIS — R0602 Shortness of breath: Secondary | ICD-10-CM | POA: Diagnosis not present

## 2019-03-11 DIAGNOSIS — F411 Generalized anxiety disorder: Secondary | ICD-10-CM

## 2019-03-11 DIAGNOSIS — M25511 Pain in right shoulder: Secondary | ICD-10-CM

## 2019-03-11 DIAGNOSIS — M545 Low back pain: Secondary | ICD-10-CM

## 2019-03-11 DIAGNOSIS — Z131 Encounter for screening for diabetes mellitus: Secondary | ICD-10-CM | POA: Diagnosis not present

## 2019-03-11 MED ORDER — TRUE METRIX BLOOD GLUCOSE TEST VI STRP: ORAL_STRIP | 12 refills | Status: DC

## 2019-03-11 MED ORDER — AMLODIPINE BESYLATE 10 MG PO TABS: 10.0000 mg | ORAL_TABLET | Freq: Every day | ORAL | 2 refills | Status: DC

## 2019-03-11 MED ORDER — GABAPENTIN 300 MG PO CAPS: 300.0000 mg | ORAL_CAPSULE | Freq: Three times a day (TID) | ORAL | 1 refills | Status: DC

## 2019-03-11 MED ORDER — TRUE METRIX METER W/DEVICE KIT: 1.0000 | PACK | Freq: Every morning | 0 refills | Status: DC

## 2019-03-11 MED ORDER — CLOTRIMAZOLE 1 % EX CREA: TOPICAL_CREAM | CUTANEOUS | 0 refills | Status: DC

## 2019-03-11 MED ORDER — METOPROLOL SUCCINATE ER 25 MG PO TB24: 25.0000 mg | ORAL_TABLET | Freq: Every day | ORAL | 3 refills | Status: DC

## 2019-03-11 MED ORDER — ROSUVASTATIN CALCIUM 40 MG PO TABS: 40.0000 mg | ORAL_TABLET | Freq: Every day | ORAL | 2 refills | Status: DC

## 2019-03-11 MED ORDER — TRUEPLUS LANCETS 28G MISC: 1.0000 | Freq: Every morning | 1 refills | Status: DC

## 2019-03-11 MED ORDER — DULOXETINE HCL 60 MG PO CPEP: 60.0000 mg | ORAL_CAPSULE | Freq: Every day | ORAL | 0 refills | Status: DC

## 2019-03-11 MED ORDER — NAPROXEN 500 MG PO TABS: 500.0000 mg | ORAL_TABLET | Freq: Every day | ORAL | 0 refills | Status: DC | PRN

## 2019-03-11 MED ORDER — TRAZODONE HCL 100 MG PO TABS: 100.0000 mg | ORAL_TABLET | Freq: Every day | ORAL | 0 refills | Status: DC

## 2019-03-11 MED ORDER — FUROSEMIDE 20 MG PO TABS: 20.0000 mg | ORAL_TABLET | Freq: Every day | ORAL | 1 refills | Status: DC

## 2019-03-11 MED ORDER — LISINOPRIL-HYDROCHLOROTHIAZIDE 20-25 MG PO TABS: 1.0000 | ORAL_TABLET | Freq: Every day | ORAL | 11 refills | Status: DC

## 2019-03-11 NOTE — Progress Notes (Signed)
C /o BL swelling of the feet X 10 months    Did not take BP  meds today  / Needs refills on meds. Made PA aware of BP and screening results

## 2019-03-11 NOTE — Patient Instructions (Signed)
Edema  Edema is when you have too much fluid in your body or under your skin. Edema may make your legs, feet, and ankles swell up. Swelling is also common in looser tissues, like around your eyes. This is a common condition. It gets more common as you get older. There are many possible causes of edema. Eating too much salt (sodium) and being on your feet or sitting for a long time can cause edema in your legs, feet, and ankles. Hot weather may make edema worse. Edema is usually painless. Your skin may look swollen or shiny. Follow these instructions at home:  Keep the swollen body part raised (elevated) above the level of your heart when you are sitting or lying down.  Do not sit still or stand for a long time.  Do not wear tight clothes. Do not wear garters on your upper legs.  Exercise your legs. This can help the swelling go down.  Wear elastic bandages or support stockings as told by your doctor.  Eat a low-salt (low-sodium) diet to reduce fluid as told by your doctor.  Depending on the cause of your swelling, you may need to limit how much fluid you drink (fluid restriction).  Take over-the-counter and prescription medicines only as told by your doctor. Contact a doctor if:  Treatment is not working.  You have heart, liver, or kidney disease and have symptoms of edema.  You have sudden and unexplained weight gain. Get help right away if:  You have shortness of breath or chest pain.  You cannot breathe when you lie down.  You have pain, redness, or warmth in the swollen areas.  You have heart, liver, or kidney disease and get edema all of a sudden.  You have a fever and your symptoms get worse all of a sudden. Summary  Edema is when you have too much fluid in your body or under your skin.  Edema may make your legs, feet, and ankles swell up. Swelling is also common in looser tissues, like around your eyes.  Raise (elevate) the swollen body part above the level of your  heart when you are sitting or lying down.  Follow your doctor's instructions about diet and how much fluid you can drink (fluid restriction). This information is not intended to replace advice given to you by your health care provider. Make sure you discuss any questions you have with your health care provider. Document Revised: 01/24/2017 Document Reviewed: 02/09/2016 Elsevier Patient Education  2020 [employer]  

## 2019-03-11 NOTE — Progress Notes (Signed)
Patient ID: Lorraine Ryan, female   DOB: Nov 24, 1955, 64 y.o.   MRN: [id number]   Lorraine Ryan, is a 64 y.o. female  KDX:[id number]  [id number]  DOB - 06-Mar-1955  Subjective:  Chief Complaint and HPI: Lorraine Ryan is a 64 y.o. female here today c/o increase in leg swelling for about 10 months.  She has not been taking her lasix now for weeks.  Denies increase in SOB.  No CP.    Denies changes in depression.  Says she is "fine" despite continued high PHQ9 scores.  Denies any SI/HI when questioned about this.    Out of meds.  Hasn't taken BP meds in a few days.  Wants a glucose meter and says no one will give her one.  Says she has diabetes but last blood sugar was fine and I do not see that she currently carries that as a diagnosis.  Very difficult to obtain history from.   ROS:   Constitutional:  No f/c, No night sweats, No unexplained weight loss. EENT:  No vision changes, No blurry vision, No hearing changes. No mouth, throat, or ear problems.  Respiratory: No cough, No SOB Cardiac: No CP, no palpitations GI:  No abd pain, No N/V/D. GU: No Urinary s/sx Musculoskeletal: B knee pain Neuro: No headache, no dizziness, no motor weakness.  Skin: No rash Endocrine:  No polydipsia. No polyuria.  Psych: Denies SI/HI  No problems updated.  ALLERGIES: Allergies  Allergen Reactions  . Codeine Other (See Comments)    Headache  . Oxycodone Nausea Only and Palpitations  . Prednisone Other (See Comments)    Headache  . Tramadol Nausea Only    PAST MEDICAL HISTORY: Past Medical History:  Diagnosis Date  . Anxiety   . Arthritis   . Back pain   . Bipolar disorder ([place])   . Chronic headaches   . Dementia ([place])   . Depression   . Diabetes mellitus without complication ([place])   . GERD (gastroesophageal reflux disease)   . Hypertension    No meds prescribed; states intermittent  . Neuromuscular disorder ([place])   . Shortness of breath dyspnea   . Stroke [facility])    caused  numbness in leg    MEDICATIONS AT HOME: Prior to Admission medications   Medication Sig Start Date End Date Taking? Authorizing Provider  amLODipine (NORVASC) 10 MG tablet Take 1 tablet (10 mg total) by mouth daily. 03/11/19  Yes Argentina Donovan, PA-C  DULoxetine (CYMBALTA) 60 MG capsule Take 1 capsule (60 mg total) by mouth daily. 03/11/19  Yes Argentina Donovan, PA-C  gabapentin (NEURONTIN) 300 MG capsule Take 1 capsule (300 mg total) by mouth 3 (three) times daily. 03/11/19  Yes McClung, Angela M, PA-C  lisinopril-hydrochlorothiazide (ZESTORETIC) 20-25 MG tablet Take 1 tablet by mouth daily. 03/11/19  Yes Freeman Caldron M, PA-C  metoprolol succinate (TOPROL XL) 25 MG 24 hr tablet Take 1 tablet (25 mg total) by mouth daily. 03/11/19  Yes Freeman Caldron M, PA-C  naproxen (NAPROSYN) 500 MG tablet Take 1 tablet (500 mg total) by mouth daily as needed. Must have office visit for refills 03/11/19  Yes Argentina Donovan, PA-C  rosuvastatin (CRESTOR) 40 MG tablet Take 1 tablet (40 mg total) by mouth daily. 03/11/19 06/09/19 Yes McClung, Dionne Bucy, PA-C  tiZANidine (ZANAFLEX) 4 MG tablet TAKE 1 TABLET BY MOUTH EVERY 6 HOURS AS NEEDED FOR MUSCLE SPASMS 02/28/19  Yes Gildardo Pounds, NP  traZODone (DESYREL) 100 MG tablet Take 1  tablet (100 mg total) by mouth at bedtime. 03/11/19  Yes McClung, Angela M, PA-C  Blood Glucose Monitoring Suppl (TRUE METRIX METER) w/Device KIT 1 each by Does not apply route every morning. 03/11/19   Argentina Donovan, PA-C  clotrimazole (LOTRIMIN) 1 % cream Apply to affected area 2 times daily 03/11/19   Argentina Donovan, PA-C  furosemide (LASIX) 20 MG tablet Take 1 tablet (20 mg total) by mouth daily. 03/11/19 04/10/19  Argentina Donovan, PA-C  glucose blood (TRUE METRIX BLOOD GLUCOSE TEST) test strip Use as instructed 03/11/19   Argentina Donovan, PA-C  Misc. Devices MISC Please provide patient with insurance approved custom compression stockings 11/03/18   Charlott Rakes, MD  triamcinolone cream  (KENALOG) 0.1 % Apply 1 application topically 2 (two) times daily. To right leg Patient not taking: Reported on 03/11/2019 12/30/18   Charlott Rakes, MD  TRUEplus Lancets 28G MISC 1 each by Does not apply route every morning. 03/11/19   Argentina Donovan, PA-C  ziprasidone (GEODON) 80 MG capsule Take 1 capsule (80 mg total) by mouth daily. Take with food Patient not taking: Reported on 03/11/2019 03/04/19   Kathlee Nations, MD     Objective:  EXAM:   Vitals:   03/11/19 0852  BP: (!) 160/90  Pulse: 98  Resp: 16  Temp: 97.8 F (36.6 C)  SpO2: 100%  Weight: 290 lb (131.5 kg)  Height: _0  (1.702 m)    General appearance : A&OX3. NAD. Non-toxic-appearing HEENT: Atraumatic and Normocephalic. No JVD Chest/Lungs:  Breathing-non-labored, Good air entry bilaterally, breath sounds normal without rales, rhonchi, or wheezing  CVS: S1 S2 regular, no murmurs, gallops, rubs  Extremities: Bilateral Lower Ext shows B 1-2+pitting  edema without weeping, lesions, or secondary infection, both legs are warm to touch with = pulse throughout Neurology:  CN II-XII grossly intact, Non focal.   Psych:  TP linear. J/I WNL. Normal speech. Appropriate eye contact and affect.  Skin:  No Rash  Data Review Lab Results  Component Value Date   HGBA1C 5.1 03/19/2016   HGBA1C 5.70 03/07/2014     Assessment & Plan   1. Bilateral edema of lower extremity resume- furosemide (LASIX) 20 MG tablet; Take 1 tablet (20 mg total) by mouth daily.  Dispense: 30 tablet; Refill: 1 - Comprehensive metabolic panel  2. Shortness of breath - furosemide (LASIX) 20 MG tablet; Take 1 tablet (20 mg total) by mouth daily.  Dispense: 30 tablet; Refill: 1  3. GAD (generalized anxiety disorder) - DULoxetine (CYMBALTA) 60 MG capsule; Take 1 capsule (60 mg total) by mouth daily.  Dispense: 90 capsule; Refill: 0 - traZODone (DESYREL) 100 MG tablet; Take 1 tablet (100 mg total) by mouth at bedtime.  Dispense: 90 tablet; Refill: 0  4.  Chronic midline low back pain without sciatica - gabapentin (NEURONTIN) 300 MG capsule; Take 1 capsule (300 mg total) by mouth 3 (three) times daily.  Dispense: 90 capsule; Refill: 1  5. Essential hypertension Uncontrolled-needs to take meds - amLODipine (NORVASC) 10 MG tablet; Take 1 tablet (10 mg total) by mouth daily.  Dispense: 30 tablet; Refill: 2 - lisinopril-hydrochlorothiazide (ZESTORETIC) 20-25 MG tablet; Take 1 tablet by mouth daily.  Dispense: 90 tablet; Refill: 11 - metoprolol succinate (TOPROL XL) 25 MG 24 hr tablet; Take 1 tablet (25 mg total) by mouth daily.  Dispense: 90 tablet; Refill: 3 - Comprehensive metabolic panel  6. Bipolar I disorder (H[facility]) - traZODone (DESYREL) 100 MG tablet; Take 1  tablet (100 mg total) by mouth at bedtime.  Dispense: 90 tablet; Refill: 0  7. Chronic right shoulder pain - naproxen (NAPROSYN) 500 MG tablet; Take 1 tablet (500 mg total) by mouth daily as needed. Must have office visit for refills  Dispense: 30 tablet; Refill: 0  8. Hyperlipidemia, unspecified hyperlipidemia type - rosuvastatin (CRESTOR) 40 MG tablet; Take 1 tablet (40 mg total) by mouth daily.  Dispense: 90 tablet; Refill: 2  9. Screening for diabetes mellitus I have had a lengthy discussion and provided education about insulin resistance and the intake of too much sugar/refined carbohydrates.  I have advised the patient to work at a goal of eliminating sugary drinks, candy, desserts, sweets, refined sugars, processed foods, and white carbohydrates.  The patient expresses understanding.  - Hemoglobin A1c   Patient have been counseled extensively about nutrition and exercise  Return in about 3 months (around 06/08/2019) for with PCP for chronic conditions.  The patient was given clear instructions to go to ER or return to medical center if symptoms don't improve, worsen or new problems develop. The patient verbalized understanding. The patient was told to call to get lab results if  they haven't heard anything in the next week.     Freeman Caldron, PA-C [facility] [place], [place]   03/11/2019, 9:22 AM

## 2019-03-12 LAB — COMPREHENSIVE METABOLIC PANEL
ALT: 27 IU/L (ref 0–32)
AST: 27 IU/L (ref 0–40)
Albumin/Globulin Ratio: 1.4 (ref 1.2–2.2)
Albumin: 4.2 g/dL (ref 3.8–4.8)
Alkaline Phosphatase: 107 IU/L (ref 39–117)
BUN/Creatinine Ratio: 17 (ref 12–28)
BUN: 12 mg/dL (ref 8–27)
Bilirubin Total: 0.8 mg/dL (ref 0.0–1.2)
CO2: 21 mmol/L (ref 20–29)
Calcium: 9.7 mg/dL (ref 8.7–10.3)
Chloride: 107 mmol/L — ABNORMAL HIGH (ref 96–106)
Creatinine, Ser: 0.72 mg/dL (ref 0.57–1.00)
GFR calc Af Amer: 103 mL/min/{1.73_m2} (ref >59)
GFR calc non Af Amer: 89 mL/min/{1.73_m2} (ref >59)
Globulin, Total: 3 g/dL (ref 1.5–4.5)
Glucose: 82 mg/dL (ref 65–99)
Potassium: 4 mmol/L (ref 3.5–5.2)
Sodium: 144 mmol/L (ref 134–144)
Total Protein: 7.2 g/dL (ref 6.0–8.5)

## 2019-03-12 LAB — HEMOGLOBIN A1C
Est. average glucose Bld gHb Est-mCnc: 111 mg/dL
Hgb A1c MFr Bld: 5.5 % (ref 4.8–5.6)

## 2019-03-23 ENCOUNTER — Other Ambulatory Visit (HOSPITAL_COMMUNITY): Payer: Self-pay | Admitting: Psychiatry

## 2019-03-23 ENCOUNTER — Other Ambulatory Visit: Payer: Self-pay | Admitting: Family Medicine

## 2019-03-23 DIAGNOSIS — F319 Bipolar disorder, unspecified: Secondary | ICD-10-CM

## 2019-03-23 DIAGNOSIS — G8929 Other chronic pain: Secondary | ICD-10-CM

## 2019-03-23 DIAGNOSIS — M545 Low back pain, unspecified: Secondary | ICD-10-CM

## 2019-04-16 ENCOUNTER — Encounter: Payer: Self-pay | Admitting: Family Medicine

## 2019-04-26 ENCOUNTER — Encounter: Payer: Self-pay | Admitting: Family Medicine

## 2019-05-05 ENCOUNTER — Other Ambulatory Visit (HOSPITAL_COMMUNITY): Payer: Self-pay | Admitting: Psychiatry

## 2019-05-05 DIAGNOSIS — F411 Generalized anxiety disorder: Secondary | ICD-10-CM

## 2019-05-05 DIAGNOSIS — F319 Bipolar disorder, unspecified: Secondary | ICD-10-CM

## 2019-05-07 ENCOUNTER — Other Ambulatory Visit (HOSPITAL_COMMUNITY): Payer: Self-pay | Admitting: Psychiatry

## 2019-05-07 DIAGNOSIS — F411 Generalized anxiety disorder: Secondary | ICD-10-CM

## 2019-05-12 ENCOUNTER — Other Ambulatory Visit: Payer: Self-pay

## 2019-05-12 ENCOUNTER — Ambulatory Visit: Payer: Medicare Other | Attending: Nurse Practitioner | Admitting: Nurse Practitioner

## 2019-05-24 ENCOUNTER — Encounter: Payer: Self-pay | Admitting: Internal Medicine

## 2019-06-01 ENCOUNTER — Ambulatory Visit: Payer: Medicare Other | Attending: Nurse Practitioner | Admitting: Internal Medicine

## 2019-06-01 ENCOUNTER — Other Ambulatory Visit: Payer: Self-pay

## 2019-06-01 DIAGNOSIS — Z5329 Procedure and treatment not carried out because of patient's decision for other reasons: Secondary | ICD-10-CM

## 2019-06-01 NOTE — Progress Notes (Signed)
Was on the phone with pt. Asked pt for her pain level on a scale of 1-10 pt hung up. Tried to call pt back pt didn't answer and was unable to lvm due to vm being full

## 2019-06-02 ENCOUNTER — Other Ambulatory Visit: Payer: Self-pay

## 2019-06-02 ENCOUNTER — Ambulatory Visit (INDEPENDENT_AMBULATORY_CARE_PROVIDER_SITE_OTHER): Payer: Medicare Other | Admitting: Psychiatry

## 2019-06-02 ENCOUNTER — Encounter (HOSPITAL_COMMUNITY): Payer: Self-pay | Admitting: Psychiatry

## 2019-06-02 DIAGNOSIS — F319 Bipolar disorder, unspecified: Secondary | ICD-10-CM | POA: Diagnosis not present

## 2019-06-02 DIAGNOSIS — F411 Generalized anxiety disorder: Secondary | ICD-10-CM

## 2019-06-02 MED ORDER — TRAZODONE HCL 100 MG PO TABS: ORAL_TABLET | ORAL | 0 refills | Status: DC

## 2019-06-02 MED ORDER — DULOXETINE HCL 60 MG PO CPEP: 60.0000 mg | ORAL_CAPSULE | Freq: Every day | ORAL | 0 refills | Status: DC

## 2019-06-02 MED ORDER — ZIPRASIDONE HCL 80 MG PO CAPS: 80.0000 mg | ORAL_CAPSULE | Freq: Every day | ORAL | 0 refills | Status: DC

## 2019-06-02 NOTE — Progress Notes (Addendum)
Virtual Visit via Telephone Note  I connected with Lorraine Ryan on 06/02/19 at 10:00 AM EDT by telephone and verified that I am speaking with the correct person using two identifiers.   I discussed the limitations, risks, security and privacy concerns of performing an evaluation and management service by telephone and the availability of in person appointments. I also discussed with the patient that there may be a patient responsible charge related to this service. The patient expressed understanding and agreed to proceed.  Patient Location: Home Provider Location: Home Office.  History of Present Illness: Patient is evaluated by phone session.  She had stopped taking Geodon because she thought that she is taking too many medication and she was frustrated with her hands and leg swelling.  She still have hands and leg swelling and now she is scheduled to see a different provider tomorrow.  She admitted lately having more irritability, mood swing, paranoia and hallucination.  She gets easily frustrated.  She is not sleeping well.  She continues to have visual and auditory hallucination but lately there has been intense.  However she denies any suicidal thoughts.  Recently she had blood work and her hemoglobin A1c and comprehensive metabolic panel was normal.  Since her son moved out she has been trying to live by herself but admitted financial issues.  She does not have a stable housing but hoping to have more resources so she can have her own place.  However she mention if things do not work out then she may go back to [place].  She is not interested in therapy.  Patient has multiple health issues.  She admitted concerns and anxiety about her general health.  She is not interested in therapy.  She is taking Cymbalta and trazodone.     Past Psychiatric History:Reviewed. No h/oinpatient treatment or any suicidal attempt. H/Oparanoia, hallucination, mania, anger and drug use. Seen in the past  at [facility] and then [facility] and prescribe Latudabut madesick. Tried Abilify but it was switched to Geodon. No h/oabuse, no history of panic attacks.  Recent Results (from the past 2160 hour(s))  Hemoglobin A1c     Status: None   Collection Time: 03/11/19  9:30 AM  Result Value Ref Range   Hgb A1c MFr Bld 5.5 4.8 - 5.6 %    Comment:          Prediabetes: 5.7 - 6.4          Diabetes: >6.4          Glycemic control for adults with diabetes: <7.0    Est. average glucose Bld gHb Est-mCnc 111 mg/dL  Comprehensive metabolic panel     Status: Abnormal   Collection Time: 03/11/19  9:30 AM  Result Value Ref Range   Glucose 82 65 - 99 mg/dL   BUN 12 8 - 27 mg/dL   Creatinine, Ser 0.72 0.57 - 1.00 mg/dL   GFR calc non Af Amer 89 >59 mL/min/1.73   GFR calc Af Amer 103 >59 mL/min/1.73   BUN/Creatinine Ratio 17 12 - 28   Sodium 144 134 - 144 mmol/L   Potassium 4.0 3.5 - 5.2 mmol/L   Chloride 107 (H) 96 - 106 mmol/L   CO2 21 20 - 29 mmol/L   Calcium 9.7 8.7 - 10.3 mg/dL   Total Protein 7.2 6.0 - 8.5 g/dL   Albumin 4.2 3.8 - 4.8 g/dL   Globulin, Total 3.0 1.5 - 4.5 g/dL   Albumin/Globulin Ratio 1.4 1.2 - 2.2  Bilirubin Total 0.8 0.0 - 1.2 mg/dL   Alkaline Phosphatase 107 39 - 117 IU/L   AST 27 0 - 40 IU/L   ALT 27 0 - 32 IU/L     Psychiatric Specialty Exam: Physical Exam  Review of Systems  There were no vitals taken for this visit.There is no height or weight on file to calculate BMI.  General Appearance: NA  Eye Contact:  NA  Speech:  Normal Rate  Volume:  Normal  Mood:  Anxious and Irritable  Affect:  NA  Thought Process:  Descriptions of Associations: Intact  Orientation:  Full (Time, Place, and Person)  Thought Content:  Hallucinations: Auditory Visual, Paranoid Ideation and Rumination  Suicidal Thoughts:  No  Homicidal Thoughts:  No  Memory:  Immediate;   Fair Recent;   Fair Remote;   Fair  Judgement:  Fair  Insight:  Fair  Psychomotor Activity:  NA   Concentration:  Concentration: Fair and Attention Span: Fair  Recall:  [employer] of Knowledge:  Fair  Language:  Good  Akathisia:  No  Handed:  Right  AIMS (if indicated):     Assets:  Communication Skills Desire for Improvement Resilience  ADL's:  Intact  Cognition:  WNL  Sleep:   fair      Assessment and Plan: Bipolar disorder type I.  Generalized anxiety disorder.  I discussed that stopping Geodon can make her more irritable and her paranoia and hallucination may get worse.  I recommend she should go back to Geodon 80 mg at bedtime and if she cannot sleep then she can try taking trazodone 50-100 mg at bedtime.  She is frustrated with her hand and leg swelling and she is going to see PCP tomorrow.  She has been complaining about swelling for more than 10 months which could be due to gabapentin.  I will forward my note to her new PCP.  Continue Cymbalta 60 mg daily.  Patient is not interested in therapy.  Discussed medication side effects and benefits.  Patient do not recall having side effects from Geodon but felt that she is taking too many medication.  I recommended to call us back if she has any question or any concern.  Follow-up in 3 months.  Follow Up Instructions:    I discussed the assessment and treatment plan with the patient. The patient was provided an opportunity to ask questions and all were answered. The patient agreed with the plan and demonstrated an understanding of the instructions.   The patient was advised to call back or seek an in-person evaluation if the symptoms worsen or if the condition fails to improve as anticipated.  I provided 20 minutes of non-face-to-face time during this encounter.   Kathlee Nations, MD

## 2019-06-03 ENCOUNTER — Ambulatory Visit: Payer: Medicare Other | Attending: Family Medicine | Admitting: Family Medicine

## 2019-06-03 ENCOUNTER — Encounter: Payer: Self-pay | Admitting: Family Medicine

## 2019-06-03 ENCOUNTER — Other Ambulatory Visit: Payer: Self-pay

## 2019-06-03 VITALS — BP 155/92 | HR 86 | Temp 98.1°F | Ht 67.0 in | Wt 307.0 lb

## 2019-06-03 DIAGNOSIS — L03115 Cellulitis of right lower limb: Secondary | ICD-10-CM

## 2019-06-03 DIAGNOSIS — M545 Low back pain, unspecified: Secondary | ICD-10-CM

## 2019-06-03 DIAGNOSIS — M7989 Other specified soft tissue disorders: Secondary | ICD-10-CM

## 2019-06-03 DIAGNOSIS — I10 Essential (primary) hypertension: Secondary | ICD-10-CM | POA: Diagnosis not present

## 2019-06-03 DIAGNOSIS — R600 Localized edema: Secondary | ICD-10-CM | POA: Diagnosis not present

## 2019-06-03 DIAGNOSIS — M79662 Pain in left lower leg: Secondary | ICD-10-CM

## 2019-06-03 DIAGNOSIS — M79661 Pain in right lower leg: Secondary | ICD-10-CM | POA: Diagnosis not present

## 2019-06-03 MED ORDER — CEPHALEXIN 500 MG PO CAPS: 500.0000 mg | ORAL_CAPSULE | Freq: Three times a day (TID) | ORAL | 0 refills | Status: AC

## 2019-06-03 MED ORDER — HYDROCHLOROTHIAZIDE 25 MG PO TABS: 25.0000 mg | ORAL_TABLET | Freq: Every day | ORAL | 3 refills | Status: DC

## 2019-06-03 MED ORDER — IRBESARTAN 300 MG PO TABS: 300.0000 mg | ORAL_TABLET | Freq: Every day | ORAL | 3 refills | Status: DC

## 2019-06-03 MED FILL — CEPHALEXIN 500 MG CAPSULE: 500 | 10 days supply | Qty: 30 | Fill #0

## 2019-06-03 NOTE — Patient Instructions (Signed)
When you receive your new blood pressure medication from [facility], stop the amlodipine and lisinopril-HCTZ as you will have different medications for your blood pressure. Avapro 300 mg and HCTZ 25 mg. Your medications are being changed to see if the amlodipine is causing your leg swelling.

## 2019-06-03 NOTE — Progress Notes (Signed)
Per pt she would like med refills for all of her meds  Per pt she is having swelling in both legs  Patient need to talk about her fluid meds, per pt she is not taking any fluid med

## 2019-06-04 ENCOUNTER — Ambulatory Visit (HOSPITAL_COMMUNITY): Payer: Medicare Other | Attending: Family Medicine

## 2019-06-04 LAB — T4 AND TSH
T4, Total: 6.8 ug/dL (ref 4.5–12.0)
TSH: 2.19 u[IU]/mL (ref 0.450–4.500)

## 2019-06-04 NOTE — Progress Notes (Signed)
Established Patient Office Visit  Subjective:  Patient ID: Lorraine Ryan, female    DOB: 27-Jul-1955  Age: 64 y.o. MRN: [id number]  CC: No chief complaint on file.   HPI Lorraine Ryan, 64 yo female who is a patient of [name], NP, who presents secondary to the complaint of continued issues with bilateral lower extremity swelling and pain in her lower legs as well as chronic low back pain with radiation for which she currently takes naproxen, tizanidine and gabapentin.  She also has history of chronic hypertension.  She reports that she is taking her blood pressure medications daily.  She reports that the fluid pill diet as part of her blood pressure medication is not controlling her leg swelling.  She was not aware of the medication, furosemide on her medication list that was prescribed previously to help of swelling in her legs.  She believes that the discomfort in her legs secondary to swelling has increased.  Swelling in her legs does not decrease with elevation of her legs and she cannot tolerate the use of support hose.  She reports that her psychiatrist told her that she should tell her doctor to discontinue her gabapentin as the gabapentin may be causing her leg pain however patient has burning pain which radiates down her legs and gabapentin has been the only thing that helps with this type of pain.  She denies any shortness of breath when she is attempting to lie back and sleep.  She has had no increased shortness of breath or cough.  She does have fatigue.  She denies any current headaches related to her blood pressure but occasionally feels slightly lightheaded.  She admits to sometimes scratching her legs, especially on the right as she states that her skin feels itchy when she is having increased swelling in her legs.  She denies any current fever or chills.  No abdominal pain-no nausea or vomiting.  No chest pain or palpitations.  Past Medical History:  Diagnosis Date  . Anxiety    . Arthritis   . Back pain   . Bipolar disorder ([place])   . Chronic headaches   . Dementia ([place])   . Depression   . Diabetes mellitus without complication ([place])   . GERD (gastroesophageal reflux disease)   . Hypertension    No meds prescribed; states intermittent  . Neuromuscular disorder ([place])   . Shortness of breath dyspnea   . Stroke [facility])    caused numbness in leg    Past Surgical History:  Procedure Laterality Date  . ABDOMINAL HYSTERECTOMY     no cervix  . COLONOSCOPY WITH PROPOFOL N/A 04/18/2015   [id number] hemorrhoids/diverticulosis sigmoid colon/  . ESOPHAGOGASTRODUODENOSCOPY (EGD) WITH PROPOFOL N/A 04/18/2015   SLF:web in the proximal esophagus/dilated/  . GANGLION CYST EXCISION    . LUMBAR LAMINECTOMY/DECOMPRESSION MICRODISCECTOMY N/A 02/08/2015   Procedure: L4-5 Decompression;  Surgeon: Marybelle Killings, MD;  Location: [place];  Service: Orthopedics;  Laterality: N/A;  . POLYPECTOMY  04/18/2015   Procedure: POLYPECTOMY;  Surgeon: Danie Binder, MD;  [facility];  Service: Endoscopy;;  ascending colon polyp  . TONSILLECTOMY      Family History  Problem Relation Age of Onset  . Diabetes Mother   . Hypertension Mother   . Heart disease Other        No family history  . Breast cancer Sister        diagnosed in her 96's  . Anxiety disorder Sister   .  Depression Sister   . Anxiety disorder Brother   . Depression Brother   . Anxiety disorder Brother   . Depression Brother   . Cancer Maternal Grandmother        unknown type  . Colon cancer Neg Hx     Social History   Socioeconomic History  . Marital status: Divorced    Spouse name: Not on file  . Number of children: 5  . Years of education: Not on file  . Highest education level: Associate degree: occupational, Hotel manager, or vocational program  Occupational History  . Not on file  Tobacco Use  . Smoking status: Former Smoker    Packs/day: 0.50    Years: 10.00    Pack years: 5.00    Types:  Cigarettes    Quit date: 03/29/1985    Years since quitting: 34.2  . Smokeless tobacco: Never Used  Substance and Sexual Activity  . Alcohol use: No    Alcohol/week: 0.0 standard drinks    Comment: Used to drink heavily, no ETOH in "30-some years"  . Drug use: No    Comment: History of crack, "no drugs in 30-some years"  . Sexual activity: Not Currently  Other Topics Concern  . Not on file  Social History Narrative   Lives with Son [name] - most of the time   Has a good relationship with [name]   Has 5 children total - 2 are in [place] and the other 2 live farther away and she does not speak with them often   Exercise: gets in the pool    Diet: eats a lot of fast food   Social Determinants of Health   Financial Resource Strain:   . Difficulty of Paying Living Expenses:   Food Insecurity:   . Worried About Charity fundraiser in the Last Year:   . Arboriculturist in the Last Year:   Transportation Needs:   . Film/video editor (Medical):   Marland Kitchen Lack of Transportation (Non-Medical):   Physical Activity:   . Days of Exercise per Week:   . Minutes of Exercise per Session:   Stress:   . Feeling of Stress :   Social Connections:   . Frequency of Communication with Friends and Family:   . Frequency of Social Gatherings with Friends and Family:   . Attends Religious Services:   . Active Member of Clubs or Organizations:   . Attends Archivist Meetings:   Marland Kitchen Marital Status:   Intimate Partner Violence:   . Fear of Current or Ex-Partner:   . Emotionally Abused:   Marland Kitchen Physically Abused:   . Sexually Abused:     Outpatient Medications Prior to Visit  Medication Sig Dispense Refill  . clotrimazole (LOTRIMIN) 1 % cream Apply to affected area 2 times daily 15 g 0  . DULoxetine (CYMBALTA) 60 MG capsule Take 1 capsule (60 mg total) by mouth daily. 90 capsule 0  . gabapentin (NEURONTIN) 300 MG capsule Take 1 capsule (300 mg total) by mouth 3 (three) times daily. 90 capsule 1   . metoprolol succinate (TOPROL XL) 25 MG 24 hr tablet Take 1 tablet (25 mg total) by mouth daily. 90 tablet 3  . Misc. Devices MISC Please provide patient with insurance approved custom compression stockings 1 each 0  . naproxen (NAPROSYN) 500 MG tablet Take 1 tablet (500 mg total) by mouth daily as needed. Must have office visit for refills 30 tablet 0  . rosuvastatin (  CRESTOR) 40 MG tablet Take 1 tablet (40 mg total) by mouth daily. 90 tablet 2  . tiZANidine (ZANAFLEX) 4 MG tablet TAKE 1 TABLET BY MOUTH EVERY 6 HOURS AS NEEDED FOR MUSCLE SPASM(S) 60 tablet 0  . traZODone (DESYREL) 100 MG tablet Take 1/2 to one tab at bed time. 90 tablet 0  . triamcinolone cream (KENALOG) 0.1 % Apply 1 application topically 2 (two) times daily. To right leg 45 g 0  . ziprasidone (GEODON) 80 MG capsule Take 1 capsule (80 mg total) by mouth daily. Take with food 90 capsule 0  . amLODipine (NORVASC) 10 MG tablet Take 1 tablet (10 mg total) by mouth daily. 30 tablet 2  . lisinopril-hydrochlorothiazide (ZESTORETIC) 20-25 MG tablet Take 1 tablet by mouth daily. 90 tablet 11  . furosemide (LASIX) 20 MG tablet Take 1 tablet (20 mg total) by mouth daily. (Patient not taking: Reported on 06/03/2019) 30 tablet 1   No facility-administered medications prior to visit.    Allergies  Allergen Reactions  . Codeine Other (See Comments)    Headache  . Oxycodone Nausea Only and Palpitations  . Prednisone Other (See Comments)    Headache  . Tramadol Nausea Only    ROS Review of Systems  Constitutional: Positive for fatigue. Negative for chills and fever.  HENT: Negative for sinus pressure and sore throat.   Respiratory: Negative for cough and shortness of breath.   Cardiovascular: Positive for leg swelling. Negative for chest pain and palpitations.  Gastrointestinal: Negative for abdominal pain, constipation, diarrhea and nausea.  Endocrine: Negative for polydipsia, polyphagia and polyuria.  Genitourinary: Negative  for dysuria and frequency.  Musculoskeletal: Positive for arthralgias, back pain and gait problem.  Neurological: Positive for light-headedness. Negative for dizziness and headaches.  Hematological: Negative for adenopathy. Does not bruise/bleed easily.  Psychiatric/Behavioral: Positive for suicidal ideas. The patient is nervous/anxious.       Objective:    Physical Exam  BP (!) 155/92   Pulse 86   Temp 98.1 F (36.7 C) (Temporal)   Ht 5\' 7"  (1.702 m)   Wt (!) 307 lb (139.3 kg)   SpO2 93%   BMI 48.08 kg/m  Wt Readings from Last 3 Encounters:  06/03/19 (!) 307 lb (139.3 kg)  03/11/19 290 lb (131.5 kg)  10/06/18 284 lb (128.8 kg)     Health Maintenance Due  Topic Date Due  . COVID-19 Vaccine (1) Never done   COVID-19 vaccination discussed and patient is not sure if she will get the vaccine due to fear of side effects   Lab Results  Component Value Date   TSH 2.190 06/03/2019   Lab Results  Component Value Date   WBC 5.4 12/19/2018   HGB 13.2 12/19/2018   HCT 41.3 12/19/2018   MCV 92.8 12/19/2018   PLT 253 12/19/2018   Lab Results  Component Value Date   NA 144 03/11/2019   K 4.0 03/11/2019   CO2 21 03/11/2019   GLUCOSE 82 03/11/2019   BUN 12 03/11/2019   CREATININE 0.72 03/11/2019   BILITOT 0.8 03/11/2019   ALKPHOS 107 03/11/2019   AST 27 03/11/2019   ALT 27 03/11/2019   PROT 7.2 03/11/2019   ALBUMIN 4.2 03/11/2019   CALCIUM 9.7 03/11/2019   ANIONGAP 10 12/19/2018   Lab Results  Component Value Date   CHOL 235 (H) 06/15/2018   Lab Results  Component Value Date   HDL 82 06/15/2018   Lab Results  Component Value Date  [place] 128 (H) 06/15/2018   Lab Results  Component Value Date   TRIG 127 06/15/2018   Lab Results  Component Value Date   CHOLHDL 2.9 06/15/2018   Lab Results  Component Value Date   HGBA1C 5.5 03/11/2019      Assessment & Plan:  1. Pain and swelling of right lower leg 2. Pain and swelling of left lower leg 3.   Bilateral lower extremity edema She reports issues with pain and swelling in both lower extremities and she has some firmness to the bilateral posterior calves, right greater than left but also has hypertrophic skin changes to the area as well right greater than left.  She will be scheduled for vascular ultrasound to rule out DVT due to her pain and swelling.  She is also being referred to vascular surgery as she reports inability to tolerate compression hose.  Additionally, she was previously prescribed furosemide on review of chart but she apparently was not aware that this medication had been prescribed and she is encouraged to take medication to help with the swelling.  Her amlodipine will also be discontinued in case this medication is contributing to her bilateral lower extremity edema.  On review of chart she has had recent normal BNP.  Will check TSH to see if thyroid disorder may be contributing to her lower extremity edema. - VAS [place] LOWER EXTREMITY VENOUS (DVT); Future - T4 and TSH  3. Cellulitis of leg, right Patient with areas of abraded skin on the right shin and and these areas have increased warmth and mild redness of the surrounding skin consistent with cellulitis.  Patient will be placed on Keflex twice daily x10 days and is to follow-up with her PCP in the next 1 to 2 weeks but go to ED if she has increased redness, leg pain or any other concerns. - cephALEXin (KEFLEX) 500 MG capsule; Take 1 capsule (500 mg total) by mouth 3 (three) times daily for 10 days. For skin infection  Dispense: 30 capsule; Refill: 0  4. Essential hypertension Patient's blood pressure is uncontrolled at today's visit on her current lisinopril-hydrochlorothiazide 20 mg -25 mg, metoprolol 25 mg and amlodipine 10 mg.  She has had issues with bilateral lower extremity edema which may be related to her amlodipine 10 mg which can cause lower extremity edema.  New orders will be placed for patient to start Avapro 300 mg  daily, hydrochlorothiazide 25 mg daily and continue metoprolol with discontinuation of lisinopril-hydrochlorothiazide and amlodipine.  As patient may have some confusion regarding the change in medications, she has been asked to make a follow-up appointment next week with the clinical pharmacist and bring all of her medications to help patient transition to new medications.  She likely will not receive her medications until next week as she currently obtains long-term medications through mail order.  6. Low back pain with radiation She is encouraged to continue the use of her tizanidine and naproxen as well as gabapentin for chronic low back pain at this time.  Patient mentioned that her psychiatrist thought that the gabapentin may be contributing to her lower extremity edema and that this medication should perhaps be discontinued however would like to try discontinuation of the amlodipine 10 mg initially as amlodipine 10 mg does have a high association with causing increased lower extremity edema.   Meds ordered this encounter  Medications  . cephALEXin (KEFLEX) 500 MG capsule    Sig: Take 1 capsule (500 mg total) by mouth 3 (three)  times daily for 10 days. For skin infection    Dispense:  30 capsule    Refill:  0  . irbesartan (AVAPRO) 300 MG tablet    Sig: Take 1 tablet (300 mg total) by mouth daily. To lower blood pressure    Dispense:  90 tablet    Refill:  3    Stop amlodipine due to leg swelling; stop lisinopril-hctz; substituting avapro and separate HCTZ  . hydrochlorothiazide (HYDRODIURIL) 25 MG tablet    Sig: Take 1 tablet (25 mg total) by mouth daily. To lower blood pressure    Dispense:  90 tablet    Refill:  3    Follow-up: Return in about 2 years (around 06/02/2021) for leg swelling/HTN/Back pain with PCP-Fleming in 1-2 weeks; 1 week for BP/meds with Lurena Joiner.   More than 50 minutes of face-to-face time was spent with the patient at today's visit discussing her current medical  issues, assessment and treatment plan, ordering of tests and medications with additional time spent with review of chart and completion of visit note between 15 to 20 minutes.   Antony Blackbird, MD

## 2019-06-08 ENCOUNTER — Ambulatory Visit: Payer: Medicare Other | Admitting: Nurse Practitioner

## 2019-06-17 ENCOUNTER — Telehealth: Payer: Self-pay

## 2019-06-17 NOTE — Telephone Encounter (Signed)
lvm asking pt to call office °

## 2019-06-17 NOTE — Telephone Encounter (Signed)
[facility] Night - Client Nonclinical Telephone Record AccessNurse Client [place] Night - Client Client Site [place] Physician Waunita Schooner- MD Contact Type Call Who Is Calling Patient / Member / Family / Caregiver Caller Name Betsey Holiday Phone Number [phone] Patient Name Lorraine Ryan Patient DOB [birth year] Call Type Message Only Information Provided Reason for Call Request to Schedule Office Appointment Initial Comment Caller states she is calling to schedule an appt. Additional Comment Caller declined triage. Office hours provided. Disp. Time Disposition Final User 06/16/2019 5:46:55 PM General Information Provided Yes Britt Boozer Call Closed By: Britt Boozer Transaction Date/Time: 06/16/2019 5:41:44 PM (ET)

## 2019-06-21 NOTE — Telephone Encounter (Signed)
[place] Night - Client Nonclinical Telephone Record AccessNurse Client [facility] Night - Client Client Site [place] - Night Contact Type Call Who Is Calling Patient / Member / Family / Caregiver Caller Name [name] Caller Phone Number [phone] Patient Name Lorraine Ryan Patient DOB 05-Sep-1955 Call Type Message Only Information Provided Reason for Call Request to Schedule Office Appointment Initial Comment Caller states she needs to make appt. Additional Comment Disp. Time Disposition Final User 06/20/2019 7:32:18 PM General Information Provided Yes Idolina Primer Call Closed By: Idolina Primer Transaction Date/Time: 06/20/2019 7:29:18 PM (ET)

## 2019-06-22 ENCOUNTER — Ambulatory Visit: Payer: Medicare Other | Admitting: Family Medicine

## 2019-06-22 NOTE — Telephone Encounter (Signed)
Attempted to reach pt again to schedule. Went straight to [employer]. Lvm asking her to call office.

## 2019-06-28 ENCOUNTER — Ambulatory Visit (INDEPENDENT_AMBULATORY_CARE_PROVIDER_SITE_OTHER): Payer: Medicare Other | Admitting: Nurse Practitioner

## 2019-06-28 ENCOUNTER — Other Ambulatory Visit: Payer: Self-pay

## 2019-06-28 ENCOUNTER — Encounter: Payer: Self-pay | Admitting: Nurse Practitioner

## 2019-06-28 ENCOUNTER — Encounter: Payer: Self-pay | Admitting: Family Medicine

## 2019-06-28 ENCOUNTER — Ambulatory Visit: Payer: Self-pay | Admitting: Pharmacist

## 2019-06-28 VITALS — BP 122/70 | HR 95 | Temp 98.6°F | Ht 67.0 in | Wt 306.0 lb

## 2019-06-28 DIAGNOSIS — R600 Localized edema: Secondary | ICD-10-CM

## 2019-06-28 DIAGNOSIS — E119 Type 2 diabetes mellitus without complications: Secondary | ICD-10-CM | POA: Diagnosis not present

## 2019-06-28 DIAGNOSIS — Z6841 Body Mass Index (BMI) 40.0 and over, adult: Secondary | ICD-10-CM | POA: Diagnosis not present

## 2019-06-28 DIAGNOSIS — F32A Depression, unspecified: Secondary | ICD-10-CM

## 2019-06-28 DIAGNOSIS — I10 Essential (primary) hypertension: Secondary | ICD-10-CM

## 2019-06-28 DIAGNOSIS — F329 Major depressive disorder, single episode, unspecified: Secondary | ICD-10-CM

## 2019-06-28 DIAGNOSIS — R300 Dysuria: Secondary | ICD-10-CM | POA: Insufficient documentation

## 2019-06-28 NOTE — Chronic Care Management (AMB) (Signed)
  Chronic Care Management   Note  06/28/2019 Name: Lorraine Ryan MRN: [id number] DOB: February 02, 1956  Lorraine Ryan is a 64 y.o. year old female who is a primary care patient of Marval Regal, NP. The CCM team was consulted for assistance with chronic disease management and care coordination needs.    Received message from PCP that this patient is homeless and has been living in her car. Contacted, she is agreeable to Care Guide referral. Placed urgent referral. Denies medication or food affordability concerns at this time. Scheduled f/u call with me in ~ 3 weeks for medication management review.   Catie Darnelle Maffucci, PharmD, [place], CPP Clinical Pharmacist [place] [phone]
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
## 2019-06-28 NOTE — Patient Instructions (Signed)
Please go to the lab today for routine blood work.    I have placed a referral into the vascular doctors to evaluate your leg swelling and to assist you with this problem.  Please try to elevate your legs as much as possible.  Please try to eat a lower salt diet.  I will place referral into our chronic care management to assist you with medications and homelessness.  Please bring in the medications that you are actually taking now so we can compare them to your list.  Follow-up office visit in 1 week.  We can discuss your blood work, and next plan of care.

## 2019-06-29 ENCOUNTER — Telehealth: Payer: Self-pay | Admitting: Nurse Practitioner

## 2019-06-29 LAB — LIPID PANEL
Cholesterol: 204 mg/dL — ABNORMAL HIGH (ref 0–200)
HDL: 48.5 mg/dL (ref >39.00)
LDL Cholesterol: 134 mg/dL — ABNORMAL HIGH (ref 0–99)
NonHDL: 155.35
Total CHOL/HDL Ratio: 4
Triglycerides: 107 mg/dL (ref 0.0–149.0)
VLDL: 21.4 mg/dL (ref 0.0–40.0)

## 2019-06-29 LAB — CBC WITH DIFFERENTIAL/PLATELET
Basophils Absolute: 0.1 10*3/uL (ref 0.0–0.1)
Basophils Relative: 1.1 % (ref 0.0–3.0)
Eosinophils Absolute: 0.1 10*3/uL (ref 0.0–0.7)
Eosinophils Relative: 2.6 % (ref 0.0–5.0)
HCT: 39.4 % (ref 36.0–46.0)
Hemoglobin: 13 g/dL (ref 12.0–15.0)
Lymphocytes Relative: 27.9 % (ref 12.0–46.0)
Lymphs Abs: 1.5 10*3/uL (ref 0.7–4.0)
MCHC: 33 g/dL (ref 30.0–36.0)
MCV: 91.9 fl (ref 78.0–100.0)
Monocytes Absolute: 0.6 10*3/uL (ref 0.1–1.0)
Monocytes Relative: 11.4 % (ref 3.0–12.0)
Neutro Abs: 3.1 10*3/uL (ref 1.4–7.7)
Neutrophils Relative %: 57 % (ref 43.0–77.0)
Platelets: 205 10*3/uL (ref 150.0–400.0)
RBC: 4.29 Mil/uL (ref 3.87–5.11)
RDW: 14.3 % (ref 11.5–15.5)
WBC: 5.4 10*3/uL (ref 4.0–10.5)

## 2019-06-29 LAB — HEMOGLOBIN A1C: Hgb A1c MFr Bld: 5.6 % (ref 4.6–6.5)

## 2019-06-29 LAB — MICROALBUMIN / CREATININE URINE RATIO
Creatinine,U: 59 mg/dL
Microalb Creat Ratio: 1.2 mg/g (ref 0.0–30.0)
Microalb, Ur: <0.7 mg/dL (ref 0.0–1.9)

## 2019-06-29 LAB — COMPREHENSIVE METABOLIC PANEL
ALT: 17 U/L (ref 0–35)
AST: 24 U/L (ref 0–37)
Albumin: 3.9 g/dL (ref 3.5–5.2)
Alkaline Phosphatase: 89 U/L (ref 39–117)
BUN: 9 mg/dL (ref 6–23)
CO2: 27 mEq/L (ref 19–32)
Calcium: 9.3 mg/dL (ref 8.4–10.5)
Chloride: 106 mEq/L (ref 96–112)
Creatinine, Ser: 0.73 mg/dL (ref 0.40–1.20)
GFR: 80.19 mL/min (ref >60.00)
Glucose, Bld: 93 mg/dL (ref 70–99)
Potassium: 3.4 mEq/L — ABNORMAL LOW (ref 3.5–5.1)
Sodium: 141 mEq/L (ref 135–145)
Total Bilirubin: 1.3 mg/dL — ABNORMAL HIGH (ref 0.2–1.2)
Total Protein: 6.6 g/dL (ref 6.0–8.3)

## 2019-06-29 LAB — B12 AND FOLATE PANEL
Folate: 16.3 ng/mL (ref >5.9)
Vitamin B-12: 224 pg/mL (ref 211–911)

## 2019-06-29 LAB — VITAMIN D 25 HYDROXY (VIT D DEFICIENCY, FRACTURES): VITD: 14.46 ng/mL — ABNORMAL LOW (ref 30.00–100.00)

## 2019-06-29 LAB — TSH: TSH: 1.59 u[IU]/mL (ref 0.35–4.50)

## 2019-06-29 NOTE — Telephone Encounter (Signed)
° °  [facility] 06/29/2019 1st Attempt    Name: Lorraine Ryan    MRN: [id number]    DOB: 12/25/1955    AGE: 64 y.o.    GENDER: female    PCP Marval Regal, NP.   Called pt regarding [name] Referral for housing assistance. Patient is currently homeless and living out of her trunk. [name] did not answer and could not leave a voicemail because voicemail box is full. Currently [employer] is Recruitment consultant for [employer]. Once contact is made with patient, will see if that is something she may be interested in pursuing. Care Guide will also reach out to other resources in the area to see if there are any availability at the shelters in [facility].    [place], Care Management Phone: [phone] Email: [email or web address]

## 2019-06-29 NOTE — Progress Notes (Signed)
Reviewed the note and agree with the plan.

## 2019-06-30 ENCOUNTER — Telehealth: Payer: Self-pay | Admitting: Nurse Practitioner

## 2019-06-30 DIAGNOSIS — Z5900 Homelessness unspecified: Secondary | ICD-10-CM

## 2019-06-30 NOTE — Telephone Encounter (Signed)
   [facility] 06/30/2019   Name: Lorraine Ryan   MRN: [id number]   DOB: [birth year]   AGE: 64 y.o.   GENDER: female   PCP Marval Regal, NP.   Called pt regarding [name] Referral for housing assistance. Spoke with Lorraine Ryan regarding the referral. Care Guide asked patient if she had section 8 housing. Patient stated that this was not true and that she does not have Section 8 housing. Patient proceeded to get an attitude and then she hung up the phone before Care Guide could give her resources for housing. Care Guide attempted to call patient back a third time, but Lorraine Ryan did not answer and phone went to voicemail.    [place], Care Management Phone: [phone] Email: [email or web address]

## 2019-06-30 NOTE — Telephone Encounter (Signed)
I was not able to reach the patient yesterday or today. Her voicemail was full and I could not leave a message. Do we have an alternative phone number?    Treatment plan is: 1. Stable housing 2. Vascular referral  3. Lower dose of amlodipine to 5 mg daily 4. Continue f/up with her psychiatrist, [name] whom she saw 06/28/19.  PLAN: 1. I would like her to decrease her Norvasc to 5 mg daily.  2. Can you check on her VASCULAR referral placed 06/28/19? 3. Was she reached by Care guide referral team re getting her housing?   4. OV with me next week as planned 07/06/19.

## 2019-06-30 NOTE — Telephone Encounter (Signed)
   [facility] 06/30/2019   Name: Lorraine Ryan   MRN: [id number]   DOB: [birth year]   AGE: 64 y.o.   GENDER: female   PCP Marval Regal, NP.   Attempted to contact [name] several times regarding referral, but have not been able to give patient resources for housing. Patient may benefit from applying to [facility] for the senior housing program. If patient has any additional needs she will need to contact the office. Referral will be closed due to unable to contact.   Closing referral pending any other needs of patient.     [place], Care Management Phone: [phone] Email: [email or web address]

## 2019-07-01 NOTE — Telephone Encounter (Signed)
I placed a Care guide referral.I believe the patient did not respond to the phone calls. We have sent my chart message- maybe she will respond with those.  Can your care guide members try to reach her again?

## 2019-07-01 NOTE — Telephone Encounter (Signed)
Mychart message sent to patient.

## 2019-07-01 NOTE — Telephone Encounter (Signed)
Correct, [name] called patient 3 times; patient hung up on her the 2nd time per her documentation  Lorraine Ryan, if you could send a MyChart message asking patient to call our Care Guide back to discuss options to help with housing, that would be great.   [name], [phone]

## 2019-07-01 NOTE — Telephone Encounter (Signed)
I sent patient a mychart message today advising her to decrease norvasc to 5mg  QD. I did not see the care guide referral in place. Can you send referral?

## 2019-07-02 ENCOUNTER — Telehealth: Payer: Self-pay | Admitting: Nurse Practitioner

## 2019-07-02 NOTE — Telephone Encounter (Signed)
   [facility] 07/02/2019   Name: Lorraine Ryan   MRN: [id number]   DOB: 1955/12/15   AGE: 64 y.o.   GENDER: female   PCP Marval Regal, NP.   Called pt regarding [name] Referral for housing. Patient stated that currently she is looking for an apartment. Asked patient if she will be willing to go to a shelter in [place]. Patient stated that she does not want to go into a shelter and that she only wants to find an apartment. Gave patient information for [employer] Ladene Artist Darrick Meigs [phone]) to start the application process for senior housing. Care Guide will continue to look for resources to assist the patient because she is homeless.    [place], Care Management Phone: [phone] Email: [email or web address]

## 2019-07-06 ENCOUNTER — Ambulatory Visit: Payer: Medicare Other | Admitting: Nurse Practitioner

## 2019-07-06 DIAGNOSIS — Z0289 Encounter for other administrative examinations: Secondary | ICD-10-CM

## 2019-07-06 NOTE — Telephone Encounter (Signed)
Sent e-mail to Lestine Box at [employer] [facility] to see if there is any availability within the shelters in the [place] area.    From: [name] To: [email or web address]   Subject: Availability   Hi Marzetta Merino,   I am working with a patient Ms. Lorraine Ryan (phone # [phone]) who is currently experiencing homelessness. I was wondering if there is any availability within the shelters in the [place] area.   Thanks,   [place], Care Management Phone: [phone] Email: [email or web address]

## 2019-07-06 NOTE — Telephone Encounter (Signed)
   [facility] 07/06/2019   Name: Lorraine Ryan   MRN: [id number]   DOB: [birth year]   AGE: 64 y.o.   GENDER: female   PCP Marval Regal, NP.   Called pt regarding Community Resource Referral for assistance with housing. Asked patient if she was able to get in touch with [employer]. Patient stated that she has not been able to contact them yet, but she will. Informed patient that she can also give [employer] a call to see if they will have any availability tomorrow 07/07/19 for the shelter.  Also let Ms. Crail know about the [employer] program within [employer]. Patient stated that she has already placed an application with the [place] and she was placed on a waiting list, but have not heard anything back yet.   [place], Care Management Phone: [phone] Email: [email or web address]

## 2019-07-06 NOTE — Telephone Encounter (Signed)
[place] and spoke with a representative. The representative stated that currently they do not have any female beds available, however that may change and that patient should give them a call on 07/07/19 between 8am and 9am to see if there is any availability. Care Guide will give information to Lorraine Ryan.

## 2019-07-08 NOTE — Telephone Encounter (Signed)
Received e-mail from Lestine Box at [employer] of Ending [facility]eless[facility]. Marzetta Merino stated to have the patient give her a call at [phone] to discuss her housing situation.  Care Guide will give patient a call.

## 2019-07-08 NOTE — Telephone Encounter (Signed)
   [facility] 07/08/2019   Name: Vidushi Sabey   MRN: [id number]   DOB: [birth year]   AGE: 64 y.o.   GENDER: female   PCP Marval Regal, NP.   Called pt regarding [name] Referral for housing. Gave Ms. Nunnally the information for Lestine Box at [employer] for Ending homelessness. Informed Ms. Dahill to give Marzetta Merino a call to discuss her housing situation. Marzetta Merino can help the patient to start the process to look for emergency housing. Patient stated understanding and said she will give Mrs. Tamala Julian a call. Patient stated she had no additional needs at this time. Care Guide has already discussed [employer], [facility], and [place] with Ms. Stann Mainland.  Gave patient Care Guide contact information just in case she has any further questions regarding housing.   Closing referral pending any other needs of patient.     [place], Care Management Phone: [phone] Email: [email or web address]

## 2019-07-12 ENCOUNTER — Telehealth: Payer: Self-pay | Admitting: Radiology

## 2019-07-12 NOTE — Telephone Encounter (Signed)
Called patient to schedule new gyn per request of patient, no answer and unable to leave message due mailbox being full

## 2019-07-15 ENCOUNTER — Telehealth: Payer: Medicare Other

## 2019-07-15 ENCOUNTER — Ambulatory Visit: Payer: Self-pay | Admitting: Pharmacist

## 2019-07-15 ENCOUNTER — Telehealth: Payer: Self-pay | Admitting: Nurse Practitioner

## 2019-07-15 ENCOUNTER — Encounter: Payer: Self-pay | Admitting: Pharmacist

## 2019-07-15 NOTE — Chronic Care Management (AMB) (Signed)
  Chronic Care Management   Note  07/15/2019 Name: Lorraine Ryan MRN: [id number] DOB: July 11, 1955  Lorraine Ryan is a 64 y.o. year old female who is a primary care patient of Marval Regal, NP. The CCM team was consulted for assistance with chronic disease management and care coordination needs.   Attempted to contact patient for medication management review. Unable to leave voicemail Plan: - Will collaborate with Care Guide to outreach to schedule follow up with me. Will also send a MyChart message today, as that appears to be a preferred method of communication for the patient    Catie Darnelle Maffucci, PharmD, [place], [facility] [phone]

## 2019-07-15 NOTE — Telephone Encounter (Signed)
Sent message via MyChart.

## 2019-07-16 ENCOUNTER — Telehealth: Payer: Self-pay | Admitting: Nurse Practitioner

## 2019-07-16 NOTE — Chronic Care Management (AMB) (Signed)
  Chronic Care Management   Note  07/16/2019 Name: [name] MRN: [id number] DOB: December 25, 1955  Lorraine Ryan is a 64 y.o. year old female who is a primary care patient of Marval Regal, NP and is actively engaged with the care management team. I reached out to [name] by phone today to assist with re-scheduling an initial visit with the Pharmacist.  Follow up plan: Unsuccessful telephone outreach attempt made. A HIPPA compliant patient message was sent for the patient providing contact information and requesting a return call. The care management team will reach out to the patient again over the next 7 days. If patient returns call to provider office, please advise to call [place] at [place], [place] Management  [place], [place] [address] Direct Dial: [email or web address] Website: [email or web address]

## 2019-07-23 NOTE — Chronic Care Management (AMB) (Signed)
  Chronic Care Management   Note  07/23/2019 Name: Lorraine Ryan MRN: [id number] DOB: [birth year]  Lorraine Ryan is a 64 y.o. year old female who is a primary care patient of Marval Regal, NP and is actively engaged with the care management team. I reached out to [name] by phone today to assist with re-scheduling a follow up visit with the Pharmacist.  Follow up plan: Unsuccessful telephone outreach attempt made.The care management team will reach out to the patient again over the next 7 days. If patient returns call to provider office, please advise to call [place] at [phone].  [place], [place] [address] Direct Dial: [phone] [email or web address]: [email or web address]

## 2019-07-26 ENCOUNTER — Other Ambulatory Visit (HOSPITAL_COMMUNITY): Payer: Self-pay | Admitting: Psychiatry

## 2019-07-26 ENCOUNTER — Encounter (INDEPENDENT_AMBULATORY_CARE_PROVIDER_SITE_OTHER): Payer: Self-pay | Admitting: Vascular Surgery

## 2019-07-26 DIAGNOSIS — F411 Generalized anxiety disorder: Secondary | ICD-10-CM

## 2019-07-27 ENCOUNTER — Other Ambulatory Visit (HOSPITAL_COMMUNITY): Payer: Self-pay | Admitting: Psychiatry

## 2019-07-27 ENCOUNTER — Other Ambulatory Visit: Payer: Self-pay | Admitting: Physician Assistant

## 2019-07-27 DIAGNOSIS — G8929 Other chronic pain: Secondary | ICD-10-CM

## 2019-07-27 DIAGNOSIS — F319 Bipolar disorder, unspecified: Secondary | ICD-10-CM

## 2019-07-28 ENCOUNTER — Encounter (HOSPITAL_COMMUNITY): Payer: Self-pay

## 2019-07-28 ENCOUNTER — Emergency Department (HOSPITAL_COMMUNITY)
Admission: EM | Admit: 2019-07-28 | Discharge: 2019-07-28 | Disposition: A | Discharge status: Home / Self Care | Payer: Medicare Other | Attending: Emergency Medicine | Admitting: Emergency Medicine

## 2019-07-28 ENCOUNTER — Other Ambulatory Visit: Payer: Self-pay

## 2019-07-28 DIAGNOSIS — Y929 Unspecified place or not applicable: Secondary | ICD-10-CM | POA: Diagnosis not present

## 2019-07-28 DIAGNOSIS — Y849 Medical procedure, unspecified as the cause of abnormal reaction of the patient, or of later complication, without mention of misadventure at the time of the procedure: Secondary | ICD-10-CM | POA: Diagnosis not present

## 2019-07-28 DIAGNOSIS — Y939 Activity, unspecified: Secondary | ICD-10-CM | POA: Diagnosis not present

## 2019-07-28 DIAGNOSIS — S20162A Insect bite (nonvenomous) of breast, left breast, initial encounter: Secondary | ICD-10-CM | POA: Diagnosis not present

## 2019-07-28 DIAGNOSIS — Y999 Unspecified external cause status: Secondary | ICD-10-CM | POA: Diagnosis not present

## 2019-07-28 DIAGNOSIS — Z5321 Procedure and treatment not carried out due to patient leaving prior to being seen by health care provider: Secondary | ICD-10-CM | POA: Diagnosis not present

## 2019-07-28 NOTE — ED Triage Notes (Signed)
Patient arrived with complaints of an unknown insect bite on left breast yesterday. Redness noted surrounding bite.

## 2019-07-29 ENCOUNTER — Telehealth: Payer: Self-pay | Admitting: Nurse Practitioner

## 2019-07-29 NOTE — Telephone Encounter (Signed)
[employer] called today with patient online. Patient stated she is a current patient of Dr Einar Pheasant and wanted to schedule appointment Advised patient that it looks like 06/28/2018 she TOC to [employer] [facility] @ [place] location  Patient stated she never done that. She doesn't see her. I advised patient we could see if we could transfer her care back to dr cody but I would need to send a message back to get the okay.   Patient stated she did know what I was talking about she never seen [employer], she would just find another provider that could care for her.   I apologized to the patient, and told her we could send the message to dr cody in regards to transferring her care back to [place], patient declined

## 2019-07-29 NOTE — Chronic Care Management (AMB) (Signed)
°  Chronic Care Management   Note  07/29/2019 Name: Lorraine Ryan MRN: [id number] DOB: 1955-06-22  [name] is a 64 y.o. year old female who is a primary care patient of Marval Regal, NP and is actively engaged with the care management team. I reached out to [name] by phone today to assist with re-scheduling a follow up visit with the Pharmacist.  Follow up plan: Unsuccessful telephone outreach attempt made. Unable to make contact on outreach attempts x 3. PCP Marval Regal, NP and Alphonzo Severance, [place] notified via routed documentation in medical record. The care management team is available to follow up with the patient after provider conversation with the patient regarding recommendation for care management engagement and subsequent re-referral to the care management team. If patient returns call to provider office, please advise to call [place] at [phone].  [place], [place] [address] Direct Dial: [phone] [email or web address]: [email or web address]

## 2019-07-30 ENCOUNTER — Ambulatory Visit: Payer: Self-pay | Admitting: Pharmacist

## 2019-07-30 NOTE — Chronic Care Management (AMB) (Signed)
  Chronic Care Management   Note  07/30/2019 Name: Lorraine Ryan MRN: [id number] DOB: 31-Jul-1955  Lorraine Ryan is a 64 y.o. year old female who is a primary care patient of Marval Regal, NP. The CCM team was consulted for assistance with chronic disease management and care coordination needs.    Per TOC note dated 07/29/19, patient planning to transfer care to another PCP. Closing CCM case.  Catie Darnelle Maffucci, PharmD, [place], CPP Clinical Pharmacist [place] [phone]

## 2019-08-03 ENCOUNTER — Other Ambulatory Visit (HOSPITAL_COMMUNITY): Payer: Self-pay | Admitting: Psychiatry

## 2019-08-03 DIAGNOSIS — F319 Bipolar disorder, unspecified: Secondary | ICD-10-CM

## 2019-08-03 DIAGNOSIS — F411 Generalized anxiety disorder: Secondary | ICD-10-CM

## 2019-08-15 ENCOUNTER — Other Ambulatory Visit: Payer: Self-pay | Admitting: Physician Assistant

## 2019-08-15 ENCOUNTER — Other Ambulatory Visit: Payer: Self-pay | Admitting: Nurse Practitioner

## 2019-08-15 DIAGNOSIS — M25511 Pain in right shoulder: Secondary | ICD-10-CM

## 2019-08-15 DIAGNOSIS — M545 Low back pain, unspecified: Secondary | ICD-10-CM

## 2019-08-15 DIAGNOSIS — R0602 Shortness of breath: Secondary | ICD-10-CM

## 2019-08-15 DIAGNOSIS — R600 Localized edema: Secondary | ICD-10-CM

## 2019-09-01 ENCOUNTER — Other Ambulatory Visit: Payer: Self-pay

## 2019-09-01 ENCOUNTER — Encounter (HOSPITAL_COMMUNITY): Payer: Self-pay | Admitting: Psychiatry

## 2019-09-01 ENCOUNTER — Telehealth (INDEPENDENT_AMBULATORY_CARE_PROVIDER_SITE_OTHER): Payer: Medicare Other | Admitting: Psychiatry

## 2019-09-01 VITALS — Wt 306.0 lb

## 2019-09-01 DIAGNOSIS — F319 Bipolar disorder, unspecified: Secondary | ICD-10-CM | POA: Diagnosis not present

## 2019-09-01 DIAGNOSIS — F411 Generalized anxiety disorder: Secondary | ICD-10-CM

## 2019-09-01 MED ORDER — ZIPRASIDONE HCL 80 MG PO CAPS: 80.0000 mg | ORAL_CAPSULE | Freq: Every day | ORAL | 0 refills | Status: DC

## 2019-09-01 MED ORDER — DULOXETINE HCL 60 MG PO CPEP: 60.0000 mg | ORAL_CAPSULE | Freq: Every day | ORAL | 0 refills | Status: DC

## 2019-09-01 MED ORDER — TRAZODONE HCL 100 MG PO TABS: ORAL_TABLET | ORAL | 0 refills | Status: DC

## 2019-09-01 NOTE — Progress Notes (Signed)
Virtual Visit via Telephone Note  I connected with Lorraine Ryan on 09/01/19 at 10:00 AM EDT by telephone and verified that I am speaking with the correct person using two identifiers.  Location: Patient: in her vehicle Provider: home office   I discussed the limitations, risks, security and privacy concerns of performing an evaluation and management service by telephone and the availability of in person appointments. I also discussed with the patient that there may be a patient responsible charge related to this service. The patient expressed understanding and agreed to proceed.   History of Present Illness: Patient is evaluated by phone session.  She is in her vehicle.  She does not have a place to live when she stays in her week and since son got married.  She is complaining of swelling and body ache and she has seen her primary care physician and had blood work but her swelling and pain is not getting better.  She is taking gabapentin, trazodone and Cymbalta which helps her anxiety but she still have sometimes irritability, mood swings and anger.  She has a long-term plan to move to [place] to live close to her family members.  However he does not consider that they are very supportive.  She still have paranoia and occasionally hears voices but denies any suicidal thoughts or homicidal thoughts.  She appears frustrated with her chronic pain and swelling.  In the past she had stopped the Geodon thought that swelling caused by Geodon but her swelling still persist.  She is back on Geodon and she feels that it helps the paranoia and anger.  She has no tremors or shakes.  She has multiple health issues.  She had gained weight because she does not walk and stays in her vehicle most of the time.  She denies drinking or using any illegal substances.  She is not interested in therapy.  Past Psychiatric History:Reviewed. No h/oinpatient treatment or any suicidal attempt. H/Oparanoia,  hallucination, mania, anger and drug use. Seen in the past at [facility] and then [facility] and prescribe Latudabut madesick. Tried Abilify but it was switched to Geodon. No h/oabuse, no history of panic attacks.  Recent Results (from the past 2160 hour(s))  T4 AND TSH     Status: None   Collection Time: 06/03/19 11:50 AM  Result Value Ref Range   TSH 2.190 0.450 - 4.500 uIU/mL   T4, Total 6.8 4.5 - 12.0 ug/dL  VITAMIN D 25 Hydroxy (Vit-D Deficiency, Fractures)     Status: Abnormal   Collection Time: 06/28/19  3:01 PM  Result Value Ref Range   VITD 14.46 (L) 30.00 - 100.00 ng/mL  CBC with Differential/Platelet     Status: None   Collection Time: 06/28/19  3:14 PM  Result Value Ref Range   WBC 5.4 4.0 - 10.5 K/uL   RBC 4.29 3.87 - 5.11 Mil/uL   Hemoglobin 13.0 12.0 - 15.0 g/dL   HCT 39.4 36 - 46 %   MCV 91.9 78.0 - 100.0 fl   MCHC 33.0 30.0 - 36.0 g/dL   RDW 14.3 11.5 - 15.5 %   Platelets 205.0 150 - 400 K/uL   Neutrophils Relative % 57.0 43 - 77 %   Lymphocytes Relative 27.9 12 - 46 %   Monocytes Relative 11.4 3 - 12 %   Eosinophils Relative 2.6 0 - 5 %   Basophils Relative 1.1 0 - 3 %   Neutro Abs 3.1 1.4 - 7.7 K/uL   Lymphs Abs 1.5  0.7 - 4.0 K/uL   Monocytes Absolute 0.6 0 - 1 K/uL   Eosinophils Absolute 0.1 0 - 0 K/uL   Basophils Absolute 0.1 0 - 0 K/uL  Comp Met (CMET)     Status: Abnormal   Collection Time: 06/28/19  3:14 PM  Result Value Ref Range   Sodium 141 135 - 145 mEq/L   Potassium 3.4 (L) 3.5 - 5.1 mEq/L   Chloride 106 96 - 112 mEq/L   CO2 27 19 - 32 mEq/L   Glucose, Bld 93 70 - 99 mg/dL   BUN 9 6 - 23 mg/dL   Creatinine, Ser 0.73 0.40 - 1.20 mg/dL   Total Bilirubin 1.3 (H) 0.2 - 1.2 mg/dL   Alkaline Phosphatase 89 39 - 117 U/L   AST 24 0 - 37 U/L   ALT 17 0 - 35 U/L   Total Protein 6.6 6.0 - 8.3 g/dL   Albumin 3.9 3.5 - 5.2 g/dL   GFR 80.19 >60.00 mL/min   Calcium 9.3 8.4 - 10.5 mg/dL  Lipid Profile     Status: Abnormal   Collection Time: 06/28/19   3:14 PM  Result Value Ref Range   Cholesterol 204 (H) 0 - 200 mg/dL    Comment: ATP III Classification       Desirable:  < 200 mg/dL               Borderline High:  200 - 239 mg/dL          High:  > = 240 mg/dL   Triglycerides 107.0 0 - 149 mg/dL    Comment: Normal:  <150 mg/dLBorderline High:  150 - 199 mg/dL   HDL 48.50 >39.00 mg/dL   VLDL 21.4 0.0 - 40.0 mg/dL   LDL Cholesterol 134 (H) 0 - 99 mg/dL   Total CHOL/HDL Ratio 4     Comment:                Men          Women1/2 Average Risk     3.4          3.3Average Risk          5.0          4.42X Average Risk          9.6          7.13X Average Risk          15.0          11.0                       NonHDL 155.35     Comment: NOTE:  Non-HDL goal should be 30 mg/dL higher than patient's LDL goal (i.e. LDL goal of < 70 mg/dL, would have non-HDL goal of < 100 mg/dL)  HgB A1c     Status: None   Collection Time: 06/28/19  3:14 PM  Result Value Ref Range   Hgb A1c MFr Bld 5.6 4.6 - 6.5 %    Comment: Glycemic Control Guidelines for People with Diabetes:Non Diabetic:  <6%Goal of Therapy: <7%Additional Action Suggested:  >8%   B12 and Folate Panel     Status: None   Collection Time: 06/28/19  3:14 PM  Result Value Ref Range   Vitamin B-12 224 211 - 911 pg/mL   Folate 16.3 >5.9 ng/mL  TSH     Status: None   Collection Time: 06/28/19  3:14 PM  Result Value  Ref Range   TSH 1.59 0.35 - 4.50 uIU/mL  Microalbumin / creatinine urine ratio     Status: None   Collection Time: 06/28/19  3:14 PM  Result Value Ref Range   Microalb, Ur <0.7 0.0 - 1.9 mg/dL   Creatinine,U 59.0 mg/dL   Microalb Creat Ratio 1.2 0.0 - 30.0 mg/g      Psychiatric Specialty Exam: Physical Exam  Review of Systems  Weight (!) 306 lb (138.8 kg).There is no height or weight on file to calculate BMI.  General Appearance: NA  Eye Contact:  NA  Speech:  Slow  Volume:  Normal  Mood:  Anxious  Affect:  NA  Thought Process:  Descriptions of Associations: Intact  Orientation:   Full (Time, Place, and Person)  Thought Content:  Paranoid Ideation and Rumination  Suicidal Thoughts:  No  Homicidal Thoughts:  No  Memory:  Immediate;   Fair Recent;   Fair Remote;   Fair  Judgement:  Fair  Insight:  Shallow  Psychomotor Activity:  NA  Concentration:  Concentration: Fair and Attention Span: Fair  Recall:  [employer] of Knowledge:  Fair  Language:  Good  Akathisia:  No  Handed:  Right  AIMS (if indicated):     Assets:  Communication Skills Desire for Improvement Resilience  ADL's:  Intact  Cognition:  WNL  Sleep:   ok      Assessment and Plan: Bipolar disorder type I.  Generalized anxiety disorder.  I reviewed blood work results.  Her hemoglobin A1c is stable.  Patient is taking Geodon 80 mg at bedtime which helps her sleep and she has cut down the trazodone and only take it when she cannot sleep.  I recommend to continue Geodon 80 mg at bedtime, trazodone 50-100 mg at bedtime as needed and Cymbalta 60 mg daily.  Encouraged to continue to follow-up with her physician for chronic swelling numbness.  Encouraged healthy lifestyle and start walking regularly to lose weight.  Patient is not interested in therapy.  Recommended to call us back if she has any question or any concern.  Follow-up in 3 months.  Follow Up Instructions:    I discussed the assessment and treatment plan with the patient. The patient was provided an opportunity to ask questions and all were answered. The patient agreed with the plan and demonstrated an understanding of the instructions.   The patient was advised to call back or seek an in-person evaluation if the symptoms worsen or if the condition fails to improve as anticipated.  I provided 20 minutes of non-face-to-face time during this encounter.   Kathlee Nations, MD

## 2019-09-10 ENCOUNTER — Ambulatory Visit: Payer: Medicare Other | Admitting: Podiatry

## 2019-09-12 ENCOUNTER — Other Ambulatory Visit: Payer: Self-pay | Admitting: Physician Assistant

## 2019-09-12 DIAGNOSIS — G8929 Other chronic pain: Secondary | ICD-10-CM

## 2019-09-13 ENCOUNTER — Ambulatory Visit (INDEPENDENT_AMBULATORY_CARE_PROVIDER_SITE_OTHER): Payer: Medicare Other

## 2019-09-13 VITALS — Ht 66.0 in | Wt 265.0 lb

## 2019-09-13 DIAGNOSIS — Z0000 Encounter for general adult medical examination without abnormal findings: Secondary | ICD-10-CM | POA: Diagnosis not present

## 2019-09-13 NOTE — Progress Notes (Addendum)
Subjective:   Lorraine Ryan is a 64 y.o. female who presents for an Initial Medicare Annual Wellness Visit.  Review of Systems    No ROS.  Medicare Wellness Virtual Visit.   Cardiac Risk Factors include: advanced age (>51men, >4 women);hypertension     Objective:    Today's Vitals   09/13/19 1104  Weight: 265 lb (120.2 kg)  Height: 5\' 6"  (1.676 m)   Body mass index is 42.77 kg/m.  Advanced Directives 09/13/2019 07/28/2019 08/15/2018 07/01/2018 04/20/2018 04/13/2017 03/07/2017  Does Patient Have a Medical Advance Directive? No No No No No No No  Would patient like information on creating a medical advance directive? No - Patient declined No - Patient declined - No - Patient declined No - Patient declined - -  Some encounter information is confidential and restricted. Go to Review Flowsheets activity to see all data.    Current Medications (verified) Outpatient Encounter Medications as of 09/13/2019  Medication Sig  . amLODipine (NORVASC) 10 MG tablet Take 10 mg by mouth daily.  . DULoxetine (CYMBALTA) 60 MG capsule Take 1 capsule (60 mg total) by mouth daily.  Marland Kitchen gabapentin (NEURONTIN) 300 MG capsule TAKE 1 CAPSULE BY MOUTH 3  TIMES DAILY  . lisinopril-hydrochlorothiazide (ZESTORETIC) 20-25 MG tablet Take 1 tablet by mouth daily.  . metoprolol succinate (TOPROL XL) 25 MG 24 hr tablet Take 1 tablet (25 mg total) by mouth daily.  . rosuvastatin (CRESTOR) 40 MG tablet Take 1 tablet (40 mg total) by mouth daily.  Marland Kitchen tiZANidine (ZANAFLEX) 4 MG tablet TAKE 1 TABLET BY MOUTH EVERY 6 HOURS AS NEEDED FOR MUSCLE SPASM(S)  . traZODone (DESYREL) 100 MG tablet Take 1/2 to one tab at bed time.  . ziprasidone (GEODON) 80 MG capsule Take 1 capsule (80 mg total) by mouth at bedtime.   No facility-administered encounter medications on file as of 09/13/2019.    Allergies (verified) Codeine, Oxycodone, Prednisone, and Tramadol   History: Past Medical History:  Diagnosis Date  . Anxiety   .  Arthritis   . Back pain   . Bipolar disorder ([place])   . Chronic headaches   . Dementia ([place])   . Depression   . Diabetes mellitus without complication ([place])   . GERD (gastroesophageal reflux disease)   . Hypertension    No meds prescribed; states intermittent  . Neuromuscular disorder ([place])   . Shortness of breath dyspnea   . Stroke [facility])    caused numbness in leg   Past Surgical History:  Procedure Laterality Date  . ABDOMINAL HYSTERECTOMY     no cervix  . COLONOSCOPY WITH PROPOFOL N/A 04/18/2015   YKD:XIPJASNK hemorrhoids/diverticulosis sigmoid colon/  . ESOPHAGOGASTRODUODENOSCOPY (EGD) WITH PROPOFOL N/A 04/18/2015   SLF:web in the proximal esophagus/dilated/  . GANGLION CYST EXCISION    . LUMBAR LAMINECTOMY/DECOMPRESSION MICRODISCECTOMY N/A 02/08/2015   Procedure: L4-5 Decompression;  Surgeon: Marybelle Killings, MD;  Location: [place];  Service: Orthopedics;  Laterality: N/A;  . POLYPECTOMY  04/18/2015   Procedure: POLYPECTOMY;  Surgeon: Danie Binder, MD;  [facility];  Service: Endoscopy;;  ascending colon polyp  . TONSILLECTOMY     Family History  Problem Relation Age of Onset  . Diabetes Mother   . Hypertension Mother   . Heart disease Other        No family history  . Breast cancer Sister        diagnosed in her 61's  . Anxiety disorder Sister   . Depression  Sister   . Anxiety disorder Brother   . Depression Brother   . Anxiety disorder Brother   . Depression Brother   . Cancer Maternal Grandmother        unknown type  . Colon cancer Neg Hx    Social History   Socioeconomic History  . Marital status: Divorced    Spouse name: Not on file  . Number of children: 5  . Years of education: Not on file  . Highest education level: Associate degree: occupational, Hotel manager, or vocational program  Occupational History  . Not on file  Tobacco Use  . Smoking status: Former Smoker    Packs/day: 0.50    Years: 10.00    Pack years: 5.00    Types: Cigarettes     Quit date: 03/29/1985    Years since quitting: 34.4  . Smokeless tobacco: Never Used  Vaping Use  . Vaping Use: Never used  Substance and Sexual Activity  . Alcohol use: No    Alcohol/week: 0.0 standard drinks    Comment: Used to drink heavily, no ETOH in "30-some years"  . Drug use: No    Comment: History of crack, "no drugs in 30-some years"  . Sexual activity: Not Currently  Other Topics Concern  . Not on file  Social History Narrative   She is no longer living with [name]- her sone >1.5 years ago. [name] got married yest and pans to go into the [employer]. She no longer has a place to live.    She is living in her truck and plans to go to [place] to be with other family    She has other children in       Exercise: gets in the pool  and showers there   Diet: eats a lot of fast food   Social Determinants of Health   Financial Resource Strain: Low Risk   . Difficulty of Paying Living Expenses: Not very hard  Food Insecurity: No Food Insecurity  . Worried About Charity fundraiser in the Last Year: Never true  . Ran Out of Food in the Last Year: Never true  Transportation Needs: No Transportation Needs  . Lack of Transportation (Medical): No  . Lack of Transportation (Non-Medical): No  Physical Activity:   . Days of Exercise per Week:   . Minutes of Exercise per Session:   Stress: No Stress Concern Present  . Feeling of Stress : Only a little  Social Connections:   . Frequency of Communication with Friends and Family:   . Frequency of Social Gatherings with Friends and Family:   . Attends Religious Services:   . Active Member of Clubs or Organizations:   . Attends Archivist Meetings:   Marland Kitchen Marital Status:     Tobacco Counseling Counseling given: Not Answered   Clinical Intake:  Pre-visit preparation completed: Yes        Diabetes: No  How often do you need to have someone help you when you read instructions, pamphlets, or other written materials  from your doctor or pharmacy?: 1 - Never   Interpreter Needed?: No      Activities of Daily Living In your present state of health, do you have any difficulty performing the following activities: 09/13/2019  Hearing? N  Vision? N  Difficulty concentrating or making decisions? N  Walking or climbing stairs? N  Dressing or bathing? N  Doing errands, shopping? N  Preparing Food and eating ? Y  Comment Notes she  does not cook. Brother assist or take out. Self feeds.  Using the Toilet? N  In the past six months, have you accidently leaked urine? N  Do you have problems with loss of bowel control? N  Managing your Medications? N  Managing your Finances? N  Housekeeping or managing your Housekeeping? N  Some recent data might be hidden    Patient Care Team: Marval Regal, NP as PCP - General (Nurse Practitioner) Danie Binder, MD (Inactive) as Consulting Physician (Gastroenterology)  Indicate any recent Medical Services you may have received from other than [facility] providers in the past year (date may be approximate).     Assessment:   This is a routine wellness examination for [facility].  I connected with [place] today by telephone and verified that I am speaking with the correct person using two identifiers. Location patient: home Location provider: work Persons participating in the virtual visit: patient, Marine scientist.    I discussed the limitations, risks, security and privacy concerns of performing an evaluation and management service by telephone and the availability of in person appointments. The patient expressed understanding and verbally consented to this telephonic visit.    Interactive audio and video telecommunications were attempted between this provider and patient, however failed, due to patient having technical difficulties OR patient did not have access to video capability.  We continued and completed visit with audio only.  Some vital signs may be absent or patient  reported.   Hearing/Vision screen  Hearing Screening   125Hz  250Hz  500Hz  1000Hz  2000Hz  3000Hz  4000Hz  6000Hz  8000Hz   Right ear:           Left ear:           Comments: Patient is able to hear conversational tones without difficulty.  No issues reported.    Vision Screening Comments: Visual acuity not assessed, virtual visit.      Dietary issues and exercise activities discussed: Current Exercise Habits: Home exercise routine, Type of exercise: calisthenics (swimming), Time (Minutes): 60, Frequency (Times/Week): 6, Weekly Exercise (Minutes/Week): 360, Intensity: Moderate  Goals      Patient Stated   .  DIET - REDUCE PORTION SIZE (pt-stated)      I want to lose weight.       Depression Screen PHQ 2/9 Scores 09/13/2019 06/28/2019 06/03/2019 03/11/2019 10/06/2018 06/12/2018 10/23/2017  PHQ - 2 Score 0 5 0 4 5 2  -  PHQ- 9 Score - 20 0 15 16 6  -  Exception Documentation - - - - - - Patient refusal    Fall Risk Fall Risk  09/13/2019 06/28/2019 06/03/2019 03/11/2019 04/11/2017  Falls in the past year? 0 0 0 0 Yes  Number falls in past yr: 0 - 0 0 2 or more  Injury with Fall? - - 0 - Yes  Risk Factor Category  - - - - High Fall Risk  Risk for fall due to : - - - - -  Follow up Falls evaluation completed Falls evaluation completed Falls evaluation completed - -   Handrails in use when climbing stairs? Yes  Home free of loose throw rugs in walkways, pet beds, electrical cords, etc? Yes  Adequate lighting in your home to reduce risk of falls? Yes   ASSISTIVE DEVICES UTILIZED TO PREVENT FALLS:  Life alert? No  Use of a cane, walker or w/c? Yes  Grab bars in the bathroom? No  Shower chair or bench in shower? No  Elevated toilet seat or a handicapped toilet? No  TIMED UP AND GO:  Was the test performed? No . Virtual visit.  Cognitive Function: Patient is alert and oriented x3.  Denies difficulty focusing, making decisions, memory loss.     6CIT Screen 09/13/2019  Months in reverse 0 points    Immunizations Immunization History  Administered Date(s) Administered  . Influenza,inj,Quad PF,6+ Mos 11/07/2015, 10/23/2017  . Influenza,trivalent, recombinat, inj, PF 01/13/2013  . Janssen (J&J) SARS-COV-2 Vaccination 04/23/2019  . PPD Test 06/02/2013  . Rabies, IM 11/28/2012, 12/01/2012, 12/05/2012, 12/12/2012  . Tdap 11/28/2012, 07/20/2017  . Zoster 03/20/2016    Health Maintenance Health Maintenance  Topic Date Due  . INFLUENZA VACCINE  09/05/2019  . MAMMOGRAM  08/10/2020  . COLONOSCOPY  04/17/2025  . TETANUS/TDAP  07/21/2027  . COVID-19 Vaccine  Completed  . Hepatitis C Screening  Completed  . HIV Screening  Completed    Dental Screening: Recommended annual dental exams for proper oral hygiene  Community Resource Referral / Chronic Care Management: CRR required this visit?  No   CCM required this visit?  No      Plan:   Keep all routine maintenance appointments.   I have personally reviewed and noted the following in the patient's chart:   . Medical and social history . Use of alcohol, tobacco or illicit drugs  . Current medications and supplements . Functional ability and status . Nutritional status . Physical activity . Advanced directives . List of other physicians . Hospitalizations, surgeries, and ER visits in previous 12 months . Vitals . Screenings to include cognitive, depression, and falls . Referrals and appointments  In addition, I have reviewed and discussed with patient certain preventive protocols, quality metrics, and best practice recommendations. A written personalized care plan for preventive services as well as general preventive health recommendations were provided to patient via mychart.     Varney Biles, LPN   05/13/1854    Agree with plan. Mable Paris, NP

## 2019-09-13 NOTE — Patient Instructions (Addendum)
Lorraine Ryan , Thank you for taking time to come for your Medicare Wellness Visit. I appreciate your ongoing commitment to your health goals. Please review the following plan we discussed and let me know if I can assist you in the future.   These are the goals we discussed: Goals      Patient Stated   .  DIET - REDUCE PORTION SIZE (pt-stated)      I want to lose weight.        This is a list of the screening recommended for you and due dates:  Health Maintenance  Topic Date Due  . Flu Shot  09/05/2019  . Mammogram  08/10/2020  . Colon Cancer Screening  04/17/2025  . Tetanus Vaccine  07/21/2027  . COVID-19 Vaccine  Completed  .  Hepatitis C: One time screening is recommended by Center for Disease Control  (CDC) for  adults born from [birth year] through 1965.   Completed  . HIV Screening  Completed    Immunizations Immunization History  Administered Date(s) Administered  . Influenza,inj,Quad PF,6+ Mos 11/07/2015, 10/23/2017  . Influenza,trivalent, recombinat, inj, PF 01/13/2013  . Janssen (J&J) SARS-COV-2 Vaccination 04/23/2019  . PPD Test 06/02/2013  . Rabies, IM 11/28/2012, 12/01/2012, 12/05/2012, 12/12/2012  . Tdap 11/28/2012, 07/20/2017  . Zoster 03/20/2016   Advanced directives: declined  Conditions/risks identified: none new  Follow up in one year for your annual wellness visit.   Preventive Care 40-64 Years, Female Preventive care refers to lifestyle choices and visits with your health care provider that can promote health and wellness. What does preventive care include?  A yearly physical exam. This is also called an annual well check.  Dental exams once or twice a year.  Routine eye exams. Ask your health care provider how often you should have your eyes checked.  Personal lifestyle choices, including:  Daily care of your teeth and gums.  Regular physical activity.  Eating a healthy diet.  Avoiding tobacco and drug use.  Limiting alcohol use.  Practicing  safe sex.  Taking low-dose aspirin daily starting at age 36.  Taking vitamin and mineral supplements as recommended by your health care provider. What happens during an annual well check? The services and screenings done by your health care provider during your annual well check will depend on your age, overall health, lifestyle risk factors, and family history of disease. Counseling  Your health care provider may ask you questions about your:  Alcohol use.  Tobacco use.  Drug use.  Emotional well-being.  Home and relationship well-being.  Sexual activity.  Eating habits.  Work and work Statistician.  Method of birth control.  Menstrual cycle.  Pregnancy history. Screening  You may have the following tests or measurements:  Height, weight, and BMI.  Blood pressure.  Lipid and cholesterol levels. These may be checked every 5 years, or more frequently if you are over 57 years old.  Skin check.  Lung cancer screening. You may have this screening every year starting at age 60 if you have a 30-pack-year history of smoking and currently smoke or have quit within the past 15 years.  Fecal occult blood test (FOBT) of the stool. You may have this test every year starting at age 3.  Flexible sigmoidoscopy or colonoscopy. You may have a sigmoidoscopy every 5 years or a colonoscopy every 10 years starting at age 97.  Hepatitis C blood test.  Hepatitis B blood test.  Sexually transmitted disease (STD) testing.  Diabetes screening. This  is done by checking your blood sugar (glucose) after you have not eaten for a while (fasting). You may have this done every 1-3 years.  Mammogram. This may be done every 1-2 years. Talk to your health care provider about when you should start having regular mammograms. This may depend on whether you have a family history of breast cancer.  BRCA-related cancer screening. This may be done if you have a family history of breast, ovarian,  tubal, or peritoneal cancers.  Pelvic exam and Pap test. This may be done every 3 years starting at age 44. Starting at age 8, this may be done every 5 years if you have a Pap test in combination with an HPV test.  Bone density scan. This is done to screen for osteoporosis. You may have this scan if you are at high risk for osteoporosis. Discuss your test results, treatment options, and if necessary, the need for more tests with your health care provider. Vaccines  Your health care provider may recommend certain vaccines, such as:  Influenza vaccine. This is recommended every year.  Tetanus, diphtheria, and acellular pertussis (Tdap, Td) vaccine. You may need a Td booster every 10 years.  Zoster vaccine. You may need this after age 51.  Pneumococcal 13-valent conjugate (PCV13) vaccine. You may need this if you have certain conditions and were not previously vaccinated.  Pneumococcal polysaccharide (PPSV23) vaccine. You may need one or two doses if you smoke cigarettes or if you have certain conditions. Talk to your health care provider about which screenings and vaccines you need and how often you need them. This information is not intended to replace advice given to you by your health care provider. Make sure you discuss any questions you have with your health care provider. Document Released: 02/17/2015 Document Revised: 10/11/2015 Document Reviewed: 11/22/2014 Elsevier Interactive Patient Education  2017 [place] Prevention in the Home Falls can cause injuries. They can happen to people of all ages. There are many things you can do to make your home safe and to help prevent falls. What can I do on the outside of my home?  Regularly fix the edges of walkways and driveways and fix any cracks.  Remove anything that might make you trip as you walk through a door, such as a raised step or threshold.  Trim any bushes or trees on the path to your home.  Use bright  outdoor lighting.  Clear any walking paths of anything that might make someone trip, such as rocks or tools.  Regularly check to see if handrails are loose or broken. Make sure that both sides of any steps have handrails.  Any raised decks and porches should have guardrails on the edges.  Have any leaves, snow, or ice cleared regularly.  Use sand or salt on walking paths during winter.  Clean up any spills in your garage right away. This includes oil or grease spills. What can I do in the bathroom?  Use night lights.  Install grab bars by the toilet and in the tub and shower. Do not use towel bars as grab bars.  Use non-skid mats or decals in the tub or shower.  If you need to sit down in the shower, use a plastic, non-slip stool.  Keep the floor dry. Clean up any water that spills on the floor as soon as it happens.  Remove soap buildup in the tub or shower regularly.  Attach bath mats securely with double-sided non-slip  rug tape.  Do not have throw rugs and other things on the floor that can make you trip. What can I do in the bedroom?  Use night lights.  Make sure that you have a light by your bed that is easy to reach.  Do not use any sheets or blankets that are too big for your bed. They should not hang down onto the floor.  Have a firm chair that has side arms. You can use this for support while you get dressed.  Do not have throw rugs and other things on the floor that can make you trip. What can I do in the kitchen?  Clean up any spills right away.  Avoid walking on wet floors.  Keep items that you use a lot in easy-to-reach places.  If you need to reach something above you, use a strong step stool that has a grab bar.  Keep electrical cords out of the way.  Do not use floor polish or wax that makes floors slippery. If you must use wax, use non-skid floor wax.  Do not have throw rugs and other things on the floor that can make you trip. What can I do  with my stairs?  Do not leave any items on the stairs.  Make sure that there are handrails on both sides of the stairs and use them. Fix handrails that are broken or loose. Make sure that handrails are as long as the stairways.  Check any carpeting to make sure that it is firmly attached to the stairs. Fix any carpet that is loose or worn.  Avoid having throw rugs at the top or bottom of the stairs. If you do have throw rugs, attach them to the floor with carpet tape.  Make sure that you have a light switch at the top of the stairs and the bottom of the stairs. If you do not have them, ask someone to add them for you. What else can I do to help prevent falls?  Wear shoes that:  Do not have high heels.  Have rubber bottoms.  Are comfortable and fit you well.  Are closed at the toe. Do not wear sandals.  If you use a stepladder:  Make sure that it is fully opened. Do not climb a closed stepladder.  Make sure that both sides of the stepladder are locked into place.  Ask someone to hold it for you, if possible.  Clearly mark and make sure that you can see:  Any grab bars or handrails.  First and last steps.  Where the edge of each step is.  Use tools that help you move around (mobility aids) if they are needed. These include:  Canes.  Walkers.  Scooters.  Crutches.  Turn on the lights when you go into a dark area. Replace any light bulbs as soon as they burn out.  Set up your furniture so you have a clear path. Avoid moving your furniture around.  If any of your floors are uneven, fix them.  If there are any pets around you, be aware of where they are.  Review your medicines with your doctor. Some medicines can make you feel dizzy. This can increase your chance of falling. Ask your doctor what other things that you can do to help prevent falls. This information is not intended to replace advice given to you by your health care provider. Make sure you discuss any  questions you have with your health care provider. Document Released:  11/17/2008 Document Revised: 06/29/2015 Document Reviewed: 02/25/2014 Elsevier Interactive Patient Education  2017 [employer].

## 2019-09-28 ENCOUNTER — Other Ambulatory Visit: Payer: Self-pay | Admitting: Physician Assistant

## 2019-09-28 DIAGNOSIS — G8929 Other chronic pain: Secondary | ICD-10-CM

## 2019-09-30 ENCOUNTER — Telehealth: Payer: Self-pay | Admitting: Nurse Practitioner

## 2019-09-30 NOTE — Telephone Encounter (Signed)
She has refused to see me again and does not follow up.

## 2019-09-30 NOTE — Telephone Encounter (Signed)
Will not accept patient back.   She has since seen 2 different providers since being under my care.   Would recommend she continue with Lorraine Ryan or see previous PCP who she returned to seeing after establishing with me.

## 2019-09-30 NOTE — Telephone Encounter (Signed)
Patient came in office wanting to get scheduled for an appointment with Dr.Cody. She stated she is having swelling in her legs and ankles. Please contact patient if its okay for her to get reestablished with Dr.Cody.

## 2019-09-30 NOTE — Telephone Encounter (Signed)
Yes, that is fine. 

## 2019-10-01 NOTE — Telephone Encounter (Signed)
If so then we need to start dismissal on patient .

## 2019-10-01 NOTE — Telephone Encounter (Signed)
I agree

## 2019-10-04 NOTE — Telephone Encounter (Signed)
As per discussion

## 2019-10-06 ENCOUNTER — Other Ambulatory Visit: Payer: Self-pay | Admitting: Physician Assistant

## 2019-10-06 ENCOUNTER — Encounter: Payer: Self-pay | Admitting: Nurse Practitioner

## 2019-10-06 DIAGNOSIS — G8929 Other chronic pain: Secondary | ICD-10-CM

## 2019-10-11 ENCOUNTER — Encounter: Payer: Self-pay | Admitting: Nurse Practitioner

## 2019-10-12 ENCOUNTER — Telehealth: Payer: Self-pay | Admitting: Nurse Practitioner

## 2019-10-12 NOTE — Telephone Encounter (Signed)
Patient dismissed from [facility] by Denice Paradise, NP, effective 10/06/19 Dismissal Letter sent out by 1st class mail. KLM

## 2019-10-13 ENCOUNTER — Other Ambulatory Visit (HOSPITAL_COMMUNITY): Payer: Self-pay | Admitting: Psychiatry

## 2019-10-13 DIAGNOSIS — F411 Generalized anxiety disorder: Secondary | ICD-10-CM

## 2019-10-13 DIAGNOSIS — F319 Bipolar disorder, unspecified: Secondary | ICD-10-CM

## 2019-10-14 ENCOUNTER — Other Ambulatory Visit (HOSPITAL_COMMUNITY): Payer: Self-pay | Admitting: Psychiatry

## 2019-10-14 DIAGNOSIS — F411 Generalized anxiety disorder: Secondary | ICD-10-CM

## 2019-10-14 NOTE — Telephone Encounter (Signed)
Called the patient again to cancel the appointment and inform the patient that she is discharged from our facility. Lorraine Ryan was confused and wanted to know what facility I was calling from after I had already introduced myself and the Provider that she sees. [name] states that she does no of our facility but verbalized her understanding to being discharged. I asked about the letter stating her discharge and she requested another letter sent to her as she never received the first letter after I informed her that it was sent out on the 1st of September, 2021. [name] verbalized her understanding and had no further questions and agreed to cancelling her 10/19/19 video appointment with Denice Paradise.

## 2019-10-14 NOTE — Telephone Encounter (Signed)
Called the patient and addressed the patient message sent on 10/11/19. She is complaining about leg swelling and dry skin going on for 1 year. She is concerned about possible cancer and if that is causing these symptoms. I have scheduled her a video visit with Denice Paradise on 10/19/19 at 8am to discuss.

## 2019-10-16 ENCOUNTER — Other Ambulatory Visit: Payer: Self-pay | Admitting: Nurse Practitioner

## 2019-10-16 DIAGNOSIS — E785 Hyperlipidemia, unspecified: Secondary | ICD-10-CM

## 2019-10-16 NOTE — Telephone Encounter (Signed)
Notes to clinic  Is this patient associated with your practice? If so please assess med.

## 2019-10-18 NOTE — Telephone Encounter (Signed)
Not our patient

## 2019-10-19 ENCOUNTER — Telehealth: Payer: Medicare Other | Admitting: Nurse Practitioner

## 2019-10-21 ENCOUNTER — Other Ambulatory Visit (HOSPITAL_COMMUNITY): Payer: Self-pay | Admitting: Psychiatry

## 2019-10-21 ENCOUNTER — Other Ambulatory Visit: Payer: Self-pay | Admitting: Physician Assistant

## 2019-10-21 ENCOUNTER — Other Ambulatory Visit: Payer: Self-pay | Admitting: Nurse Practitioner

## 2019-10-21 DIAGNOSIS — I10 Essential (primary) hypertension: Secondary | ICD-10-CM

## 2019-10-21 DIAGNOSIS — F319 Bipolar disorder, unspecified: Secondary | ICD-10-CM

## 2019-10-21 DIAGNOSIS — R0602 Shortness of breath: Secondary | ICD-10-CM

## 2019-10-21 DIAGNOSIS — R600 Localized edema: Secondary | ICD-10-CM

## 2019-10-21 DIAGNOSIS — G8929 Other chronic pain: Secondary | ICD-10-CM

## 2019-10-21 DIAGNOSIS — F411 Generalized anxiety disorder: Secondary | ICD-10-CM

## 2019-10-29 ENCOUNTER — Other Ambulatory Visit (HOSPITAL_COMMUNITY): Payer: Self-pay | Admitting: Psychiatry

## 2019-10-29 DIAGNOSIS — F411 Generalized anxiety disorder: Secondary | ICD-10-CM

## 2019-10-29 DIAGNOSIS — F319 Bipolar disorder, unspecified: Secondary | ICD-10-CM

## 2019-11-02 ENCOUNTER — Other Ambulatory Visit (HOSPITAL_COMMUNITY): Payer: Self-pay | Admitting: Psychiatry

## 2019-11-02 DIAGNOSIS — F319 Bipolar disorder, unspecified: Secondary | ICD-10-CM

## 2019-11-05 ENCOUNTER — Ambulatory Visit: Payer: Medicare Other | Admitting: Family Medicine

## 2019-11-18 ENCOUNTER — Telehealth: Payer: Self-pay | Admitting: Nurse Practitioner

## 2019-11-18 NOTE — Telephone Encounter (Signed)
Pt called to schedule an appointment. Shirlean Mylar advised her that she has been dismissed from [facility] [employer], so unfortunately we are unable to see her. She started saying that we were being prejudiced so Shirlean Mylar put her on hold for me to talk with her. When I picked up the phone, she started accusing [place] of being discriminatory and threatened lawsuit. I consulted with risk management and they advised to give her number for patient experience. I attempted to reach her to give her the number and she did not answer. I left a voicemail.

## 2019-11-29 NOTE — Telephone Encounter (Signed)
Pt called wanting to schedule new patient appointment.  I advise her that we cannot see her here at [facility].  She has been dismissed from [facility] and that when one [facility] dismiss she is dismissed from all [facility].  She stated she didn't understand why they dismissed her they never gave her a reason and she stated she didn't do anything wrong.   I gave her number to patient experience [phone]

## 2019-11-30 ENCOUNTER — Encounter (HOSPITAL_COMMUNITY): Payer: Medicare Other | Admitting: Psychiatry

## 2019-11-30 ENCOUNTER — Other Ambulatory Visit: Payer: Self-pay

## 2019-11-30 NOTE — Progress Notes (Signed)
No show

## 2019-12-13 ENCOUNTER — Other Ambulatory Visit: Payer: Self-pay | Admitting: Physician Assistant

## 2019-12-13 DIAGNOSIS — I10 Essential (primary) hypertension: Secondary | ICD-10-CM

## 2019-12-15 ENCOUNTER — Encounter (HOSPITAL_COMMUNITY): Payer: Self-pay | Admitting: Psychiatry

## 2019-12-15 ENCOUNTER — Other Ambulatory Visit: Payer: Self-pay

## 2019-12-15 ENCOUNTER — Telehealth (INDEPENDENT_AMBULATORY_CARE_PROVIDER_SITE_OTHER): Payer: Medicare Other | Admitting: Psychiatry

## 2019-12-15 DIAGNOSIS — F319 Bipolar disorder, unspecified: Secondary | ICD-10-CM | POA: Diagnosis not present

## 2019-12-15 DIAGNOSIS — F411 Generalized anxiety disorder: Secondary | ICD-10-CM

## 2019-12-15 MED ORDER — DULOXETINE HCL 60 MG PO CPEP: 60.0000 mg | ORAL_CAPSULE | Freq: Every day | ORAL | 0 refills | Status: DC

## 2019-12-15 MED ORDER — TRAZODONE HCL 100 MG PO TABS: ORAL_TABLET | ORAL | 0 refills | Status: DC

## 2019-12-15 MED ORDER — ZIPRASIDONE HCL 80 MG PO CAPS: 80.0000 mg | ORAL_CAPSULE | Freq: Every day | ORAL | 0 refills | Status: DC

## 2019-12-15 NOTE — Progress Notes (Signed)
Virtual Visit via Telephone Note  I connected with Lorraine Ryan on 12/15/19 at  3:00 PM EST by telephone and verified that I am speaking with the correct person using two identifiers.  Location: Patient: In Car Provider: Home Office   I discussed the limitations, risks, security and privacy concerns of performing an evaluation and management service by telephone and the availability of in person appointments. I also discussed with the patient that there may be a patient responsible charge related to this service. The patient expressed understanding and agreed to proceed.   History of Present Illness: Patient is evaluated by phone session.  She is still having issues to find a place.  Currently she is living in her truck.  She is trying to get help from [employer] who promised her to get housing.  She feels the medicine is working but she still gets easily irritable and upset because of her general health condition.  She has neck swelling and it is not getting better.  But she also told that she has not seen her PCP recently.  She still have residual paranoia, occasionally hearing voices but denies any suicidal thoughts or homicidal thoughts.  She has chronic pain.  She feels the Geodon helping her paranoia.  She has no tremors, shakes or any EPS.  She has not talked to her son since he got married and patient is living not with the son.  Her plan to move back to [place] still in process but she need money to find a place in [place].  Her head member lives there.  Patient denies drinking or using any illegal substances.  She is not interested in therapy.  She is sleeping better but she need to take full tablet of trazodone.  She denies any mania, anger, highs and lows.   Past Psychiatric History:Reviewed. No h/oinpatient treatment or any suicidal attempt. H/Oparanoia, hallucination, mania, anger and drug use. Seen in the past at [facility] and then [facility] and prescribe Latudabut  madesick. Tried Abilify but it was switched to Geodon. No h/oabuse, no history of panic attacks.  Psychiatric Specialty Exam: Physical Exam  Review of Systems  Weight 265 lb (120.2 kg).There is no height or weight on file to calculate BMI.  General Appearance: NA  Eye Contact:  NA  Speech:  Slow  Volume:  Normal  Mood:  Anxious and Irritable  Affect:  NA  Thought Process:  Descriptions of Associations: Circumstantial  Orientation:  Full (Time, Place, and Person)  Thought Content:  Paranoid Ideation and Rumination  Suicidal Thoughts:  No  Homicidal Thoughts:  No  Memory:  Immediate;   Fair Recent;   Fair Remote;   Fair  Judgement:  Fair  Insight:  Shallow  Psychomotor Activity:  NA  Concentration:  Concentration: Fair and Attention Span: Fair  Recall:  [employer] of Knowledge:  Fair  Language:  Fair  Akathisia:  No  Handed:  Right  AIMS (if indicated):     Assets:  Communication Skills Desire for Improvement  ADL's:  Intact  Cognition:  WNL  Sleep:   fair      Assessment and Plan: Bipolar disorder type I.  Generalized anxiety disorder.  Patient does not want to change the medication.  She has no side effects.  I encouraged to talk to her PCP to discuss about chronic leg swelling.  She is taking gabapentin and I explained sometimes gabapentin can cause swelling.  Patient promised to give a call to her PCP.  She like to keep her current psychotropic medication.  Continue trazodone 100 mg at bedtime, Cymbalta 60 mg daily and Geodon 80 mg at bedtime.  She is hoping that [employer] will help her to find a place to live since when current cold weather is coming.  Discussed medication side effects and benefits.  Recommended to call us back if is any question or any concern.  Follow-up in 3 months.    Follow Up Instructions:    I discussed the assessment and treatment plan with the patient. The patient was provided an opportunity to ask questions and all were answered. The  patient agreed with the plan and demonstrated an understanding of the instructions.   The patient was advised to call back or seek an in-person evaluation if the symptoms worsen or if the condition fails to improve as anticipated.  I provided 18 minutes of non-face-to-face time during this encounter.   Kathlee Nations, MD

## 2020-01-04 ENCOUNTER — Telehealth: Payer: Self-pay

## 2020-01-04 NOTE — Telephone Encounter (Signed)
Patient contacted the clinic requesting to speak with office manager. Call was returned and left a voicemail for patient to call back

## 2020-01-04 NOTE — Telephone Encounter (Signed)
Pt wanted an appointment for UTI but it showed pt was dismissed from practice. I let her know this and she said she received a letter that Dawson Bills was leaving the practice and not that she was dismissed. She was upset that no one told her. I let her know that she spoke with Azzell in September and he informed her she was dismissed. She then asked to speak with supervisor because this was not right. I let her know that I would have someone call her back. Pt was showing that Dawson Bills was her pcp in Epic so I removed Kim as her pcp.

## 2020-01-10 ENCOUNTER — Ambulatory Visit (HOSPITAL_BASED_OUTPATIENT_CLINIC_OR_DEPARTMENT_OTHER): Payer: Medicare Other | Admitting: Family Medicine

## 2020-01-10 ENCOUNTER — Other Ambulatory Visit: Payer: Self-pay

## 2020-01-10 ENCOUNTER — Encounter: Payer: Self-pay | Admitting: Family Medicine

## 2020-01-10 DIAGNOSIS — M545 Low back pain, unspecified: Secondary | ICD-10-CM | POA: Diagnosis not present

## 2020-01-10 DIAGNOSIS — G8929 Other chronic pain: Secondary | ICD-10-CM | POA: Diagnosis not present

## 2020-01-10 DIAGNOSIS — I10 Essential (primary) hypertension: Secondary | ICD-10-CM

## 2020-01-10 DIAGNOSIS — K219 Gastro-esophageal reflux disease without esophagitis: Secondary | ICD-10-CM

## 2020-01-10 DIAGNOSIS — R600 Localized edema: Secondary | ICD-10-CM

## 2020-01-10 MED ORDER — GABAPENTIN 300 MG PO CAPS: 300.0000 mg | ORAL_CAPSULE | Freq: Three times a day (TID) | ORAL | 1 refills | Status: DC

## 2020-01-10 MED ORDER — OMEPRAZOLE 40 MG PO CPDR: 40.0000 mg | DELAYED_RELEASE_CAPSULE | Freq: Every day | ORAL | 1 refills | Status: DC

## 2020-01-10 MED ORDER — LISINOPRIL-HYDROCHLOROTHIAZIDE 20-12.5 MG PO TABS: 2.0000 | ORAL_TABLET | Freq: Every day | ORAL | 1 refills | Status: DC

## 2020-01-10 NOTE — Progress Notes (Signed)
Having swelling in legs, and pain in abdomen.

## 2020-01-10 NOTE — Progress Notes (Signed)
Virtual Visit via Telephone Note  I connected with Lorraine Ryan, on 01/10/2020 at 11:28 AM by telephone due to the COVID-19 pandemic and verified that I am speaking with the correct person using two identifiers.   Consent: I discussed the limitations, risks, security and privacy concerns of performing an evaluation and management service by telephone and the availability of in person appointments. I also discussed with the patient that there may be a patient responsible charge related to this service. The patient expressed understanding and agreed to proceed.   Location of Patient: Out and about  Location of Provider: Clinic   Persons participating in Telemedicine visit: Marvis Moeller [place]-CMA Dr. Margarita Rana     History of Present Illness: 64 year old female patient of Dr. Follow-up with a medical history significant for hypertension, hyperlipidemia, and bipolar disorder here for an acute visit.   Complains of swelling in her abdomen and her legs which is described as chronic.  Her office notes from 03/11/2019 when she has seen the physician assistant indicate she had also complained of pedal edema for 10 months and was placed on Lasix. She denies presence of dyspnea, weight gain. With regards to her abdomen she denies presence of nausea or vomiting but endorses dyspepsia and reflux.  Past Medical History:  Diagnosis Date  . Anxiety   . Arthritis   . Back pain   . Bipolar disorder ([place])   . Chronic headaches   . Dementia ([place])   . Depression   . Diabetes mellitus without complication ([place])   . GERD (gastroesophageal reflux disease)   . Hypertension    No meds prescribed; states intermittent  . Neuromuscular disorder ([place])   . Shortness of breath dyspnea   . Stroke [facility])    caused numbness in leg   Allergies  Allergen Reactions  . Codeine Other (See Comments)    Headache  . Oxycodone Nausea Only and Palpitations  . Prednisone Other (See Comments)     Headache  . Tramadol Nausea Only    Current Outpatient Medications on File Prior to Visit  Medication Sig Dispense Refill  . amLODipine (NORVASC) 10 MG tablet Take 10 mg by mouth daily.    . DULoxetine (CYMBALTA) 60 MG capsule Take 1 capsule (60 mg total) by mouth daily. 90 capsule 0  . gabapentin (NEURONTIN) 300 MG capsule TAKE 1 CAPSULE BY MOUTH 3  TIMES DAILY 180 capsule 0  . lisinopril-hydrochlorothiazide (ZESTORETIC) 20-25 MG tablet Take 1 tablet by mouth daily.    . metoprolol succinate (TOPROL XL) 25 MG 24 hr tablet Take 1 tablet (25 mg total) by mouth daily. 90 tablet 3  . rosuvastatin (CRESTOR) 40 MG tablet TAKE 1 TABLET BY MOUTH  DAILY 90 tablet 2  . tiZANidine (ZANAFLEX) 4 MG tablet TAKE 1 TABLET BY MOUTH EVERY 6 HOURS AS NEEDED FOR MUSCLE SPASM(S) 60 tablet 0  . traZODone (DESYREL) 100 MG tablet Take one tab at bed time. 90 tablet 0  . ziprasidone (GEODON) 80 MG capsule Take 1 capsule (80 mg total) by mouth at bedtime. 90 capsule 0   No current facility-administered medications on file prior to visit.    Observations/Objective: Awake, alert, oriented x3 Not in acute distress  Assessment and Plan: 1. Chronic midline low back pain without sciatica Stable - gabapentin (NEURONTIN) 300 MG capsule; Take 1 capsule (300 mg total) by mouth 3 (three) times daily.  Dispense: 270 capsule; Refill: 1  2. Essential hypertension Due to pedal edema I have discontinued amlodipine and  increased her lisinopril/HCTZ controlled - lisinopril-hydrochlorothiazide (ZESTORETIC) 20-12.5 MG tablet; Take 2 tablets by mouth daily.  Dispense: 180 tablet; Refill: 1  3. Pedal edema Likely secondary to amlodipine use Discontinued amlodipine Elevate feet, use compression stockings, low-sodium  4. Gastroesophageal reflux disease without esophagitis Avoid late meals and avoid recumbency up to 2 hours post meals We will initiate PPI - omeprazole (PRILOSEC) 40 MG capsule; Take 1 capsule (40 mg total) by  mouth daily.  Dispense: 90 capsule; Refill: 1   Follow Up Instructions: 2 weeks to follow-up on pedal edema and hypertension   I discussed the assessment and treatment plan with the patient. The patient was provided an opportunity to ask questions and all were answered. The patient agreed with the plan and demonstrated an understanding of the instructions.   The patient was advised to call back or seek an in-person evaluation if the symptoms worsen or if the condition fails to improve as anticipated.     I provided 11 minutes total of non-face-to-face time during this encounter including median intraservice time, reviewing previous notes, investigations, ordering medications, medical decision making, coordinating care and patient verbalized understanding at the end of the visit.     Charlott Rakes, MD, FAAFP. [facility] and [facility], [place]   01/10/2020, 11:28 AM

## 2020-02-03 ENCOUNTER — Ambulatory Visit: Payer: Medicare Other | Admitting: Family Medicine

## 2020-02-08 ENCOUNTER — Other Ambulatory Visit (HOSPITAL_COMMUNITY): Payer: Self-pay | Admitting: Psychiatry

## 2020-02-08 DIAGNOSIS — F411 Generalized anxiety disorder: Secondary | ICD-10-CM

## 2020-02-08 DIAGNOSIS — F319 Bipolar disorder, unspecified: Secondary | ICD-10-CM

## 2020-02-15 ENCOUNTER — Other Ambulatory Visit (HOSPITAL_COMMUNITY): Payer: Self-pay | Admitting: Psychiatry

## 2020-02-15 DIAGNOSIS — F319 Bipolar disorder, unspecified: Secondary | ICD-10-CM

## 2020-02-24 ENCOUNTER — Other Ambulatory Visit (HOSPITAL_COMMUNITY): Payer: Self-pay | Admitting: Psychiatry

## 2020-02-24 DIAGNOSIS — F411 Generalized anxiety disorder: Secondary | ICD-10-CM

## 2020-02-24 DIAGNOSIS — F319 Bipolar disorder, unspecified: Secondary | ICD-10-CM

## 2020-03-02 ENCOUNTER — Telehealth (HOSPITAL_COMMUNITY): Payer: Self-pay | Admitting: *Deleted

## 2020-03-02 NOTE — Telephone Encounter (Signed)
We can give her 30 days supply to bridge her refills. If agree than call her pharmacy.

## 2020-03-02 NOTE — Telephone Encounter (Signed)
Pt called requesting refills on her medications: Geodon, Cymbalta, and Trazodone. Pt has an upcoming appointment on 03/14/20. Rx's last written on 12/15/19 for 90 days. However pt uses mail order pharmacy. So ok to refill? Or wait until appointment? Thanks.

## 2020-03-02 NOTE — Telephone Encounter (Signed)
Writer returned pt call this morning advising pt can have 30 day supply on meds sent in now or wait until appointment on 03/14/20 and get 90 day supply then. Pt has not returned call.

## 2020-03-10 ENCOUNTER — Telehealth: Payer: Self-pay

## 2020-03-10 NOTE — Telephone Encounter (Signed)
Copied from [place] [phone]. Topic: Appointment Scheduling - Scheduling Inquiry for Clinic >> Mar 08, 2020  2:42 PM Pawlus, Brayton Layman A wrote: Reason for CRM: PT needs to make an appt with Clifton James. Best contact number [phone]. >> Mar 10, 2020  8:50 AM Alanda Slim E wrote: Pt returned office call. Office not available after several attempts/ please advise    Called patient and LVM letting her know that since she has insurance she would not be eligible for any financial assistance. Advised patient to return call to [phone] with any questions or concerns.

## 2020-03-14 ENCOUNTER — Telehealth (HOSPITAL_COMMUNITY): Payer: Medicare Other | Admitting: Psychiatry

## 2020-03-14 ENCOUNTER — Other Ambulatory Visit: Payer: Self-pay

## 2020-03-15 ENCOUNTER — Other Ambulatory Visit: Payer: Self-pay

## 2020-03-15 ENCOUNTER — Telehealth (HOSPITAL_COMMUNITY): Payer: Medicare Other | Admitting: Psychiatry

## 2020-03-16 ENCOUNTER — Telehealth (INDEPENDENT_AMBULATORY_CARE_PROVIDER_SITE_OTHER): Payer: Medicare Other | Admitting: Psychiatry

## 2020-03-16 ENCOUNTER — Other Ambulatory Visit: Payer: Self-pay

## 2020-03-16 ENCOUNTER — Encounter (HOSPITAL_COMMUNITY): Payer: Self-pay | Admitting: Psychiatry

## 2020-03-16 VITALS — Wt 235.0 lb

## 2020-03-16 DIAGNOSIS — F419 Anxiety disorder, unspecified: Secondary | ICD-10-CM

## 2020-03-16 DIAGNOSIS — F319 Bipolar disorder, unspecified: Secondary | ICD-10-CM

## 2020-03-16 MED ORDER — ZIPRASIDONE HCL 80 MG PO CAPS: 80.0000 mg | ORAL_CAPSULE | Freq: Every day | ORAL | 0 refills | Status: DC

## 2020-03-16 MED ORDER — TRAZODONE HCL 100 MG PO TABS
ORAL_TABLET | ORAL | 0 refills | Status: DC
Start: 2020-03-16 — End: 2021-06-01

## 2020-03-16 MED ORDER — DULOXETINE HCL 60 MG PO CPEP: 60.0000 mg | ORAL_CAPSULE | Freq: Every day | ORAL | 0 refills | Status: DC

## 2020-03-16 NOTE — Progress Notes (Signed)
Virtual Visit via Telephone Note  I connected with Lorraine Ryan on 03/16/20 at  9:00 AM EST by telephone and verified that I am speaking with the correct person using two identifiers.  Location: Patient: [facility] Provider: Home Office   I discussed the limitations, risks, security and privacy concerns of performing an evaluation and management service by telephone and the availability of in person appointments. I also discussed with the patient that there may be a patient responsible charge related to this service. The patient expressed understanding and agreed to proceed.   History of Present Illness: Patient is evaluated by phone session.  She is on the phone by herself.  She is at [facility].  She had missed few times our phone call appointments.  Patient told she started going to [facility] and recently she had lost more than 25 pounds by doing water aerobics.  She is also learning swimming.  She feels much better.  She endorsed it helps her chronic pain.  She like to go to early morning when there is not many people around.  She still have paranoia and occasionally hallucination but denies any suicidal thoughts, agitation, highs and lows or any severe mood swings.  Patient told her Christmas was quite but she did visit her sister.  She is still having issues to find a place.  She was hoping that [employer] will help but she did not attend their meeting and she regret about it.  She is hoping her disability check increase that helps some of her financial burden.  She occasionally talked to her son who moved out.  Patient has nervousness and panic around crowded places but it is manageable.  She admitted not taking the trazodone every night because it makes her sometimes groggy and she like to wake up in the morning so she can attend [facility] pool.  She usually chooses a single lane where no one is around because she does not like around people.  Patient like to keep her current medication.  She is also taking  gabapentin for her chronic pain.  Past Psychiatric History:Reviewed. No h/oinpatient treatment or any suicidal attempt. H/Oparanoia, hallucination, mania, anger and drug use. Seen in the past at [facility] and then [facility] and prescribe Latudabut madesick. Tried Abilify but it was switched to Geodon. No h/oabuse, no history of panic attacks.   Psychiatric Specialty Exam: Physical Exam  Review of Systems  Weight 235 lb (106.6 kg).There is no height or weight on file to calculate BMI.  General Appearance: NA  Eye Contact:  NA  Speech:  Slow  Volume:  Normal  Mood:  Anxious and Irritable  Affect:  NA  Thought Process:  Descriptions of Associations: Circumstantial  Orientation:  Full (Time, Place, and Person)  Thought Content:  Paranoid Ideation  Suicidal Thoughts:  No  Homicidal Thoughts:  No  Memory:  Immediate;   Fair Recent;   Fair Remote;   Fair  Judgement:  Fair  Insight:  Shallow  Psychomotor Activity:  NA  Concentration:  Concentration: Fair and Attention Span: Fair  Recall:  [employer] of Knowledge:  Fair  Language:  Fair  Akathisia:  No  Handed:  Right  AIMS (if indicated):     Assets:  Communication Skills Desire for Improvement Transportation  ADL's:  Intact  Cognition:  WNL  Sleep:   ok      Assessment and Plan: Bipolar disorder type I.  Anxiety.  I complement on her weight loss as patient trying  to lose weight and started water aerobics.  She had lost more than 20 pounds.  Encouraged to continue back.  I also encourage she should get the blood work with her PCP.  She reported the medicine helping her mood and anxiety.  She reported no side effects.  Continue Cymbalta 60 mg daily, Geodon 80 mg at bedtime and trazodone 100 mg at bedtime if needed.  Recommended to call us back if she is any question or any concern.  Patient is not interested in therapy.  Follow-up in 3 months.  Follow Up Instructions:    I discussed the assessment and treatment plan  with the patient. The patient was provided an opportunity to ask questions and all were answered. The patient agreed with the plan and demonstrated an understanding of the instructions.   The patient was advised to call back or seek an in-person evaluation if the symptoms worsen or if the condition fails to improve as anticipated.  I provided 19 minutes of non-face-to-face time during this encounter.   Kathlee Nations, MD

## 2020-03-30 ENCOUNTER — Other Ambulatory Visit: Payer: Self-pay

## 2020-03-30 ENCOUNTER — Encounter: Payer: Self-pay | Admitting: Physician Assistant

## 2020-03-30 ENCOUNTER — Ambulatory Visit: Payer: Medicare Other | Attending: Physician Assistant | Admitting: Physician Assistant

## 2020-03-30 DIAGNOSIS — I10 Essential (primary) hypertension: Secondary | ICD-10-CM

## 2020-03-30 DIAGNOSIS — R600 Localized edema: Secondary | ICD-10-CM

## 2020-03-30 DIAGNOSIS — G8929 Other chronic pain: Secondary | ICD-10-CM

## 2020-03-30 DIAGNOSIS — M545 Low back pain, unspecified: Secondary | ICD-10-CM | POA: Diagnosis not present

## 2020-03-30 MED ORDER — FUROSEMIDE 20 MG PO TABS: 20.0000 mg | ORAL_TABLET | Freq: Every day | ORAL | 0 refills | Status: DC

## 2020-03-30 MED ORDER — MELOXICAM 7.5 MG PO TABS: 7.5000 mg | ORAL_TABLET | Freq: Every day | ORAL | 3 refills | Status: DC

## 2020-03-30 NOTE — Progress Notes (Signed)
Virtual Visit via Telephone Note  I connected with [name] on 03/30/20 at  2:30 PM EST by telephone and verified that I am speaking with the correct person using two identifiers.  Location: Patient: home Provider: [facility] office   I discussed the limitations, risks, security and privacy concerns of performing an evaluation and management service by telephone and the availability of in person appointments. I also discussed with the patient that there may be a patient responsible charge related to this service. The patient expressed understanding and agreed to proceed.   History of Present Illness: Patient c/o body and leg swelling for about 2 years but feels it is worse lately.  No increase in SOB.  No CP.    Also has issues with chronic back pain and needs med for that.  Last labs 06/2019.  She is willing to come in today or tomorrow for labs   Observations/Objective: A&Ox3.  Can be difficult to obtain history from  Assessment and Plan: 1. Pedal edema Longstanding on and off  - CBC with Differential/Platelet; Future - Brain natriuretic peptide; Future - furosemide (LASIX) 20 MG tablet; Take 1 tablet (20 mg total) by mouth daily. X5 days prn swelling  Dispense: 30 tablet; Refill: 0  2. Chronic midline low back pain without sciatica - meloxicam (MOBIC) 7.5 MG tablet; Take 1 tablet (7.5 mg total) by mouth daily. Prn pain  Dispense: 30 tablet; Refill: 3  3. Essential hypertension Stable 06/2019 Continue current regimen - Comprehensive metabolic panel; Future - CBC with Differential/Platelet; Future   Follow Up Instructions: Assign/see PCP in about 6 weeks.     I discussed the assessment and treatment plan with the patient. The patient was provided an opportunity to ask questions and all were answered. The patient agreed with the plan and demonstrated an understanding of the instructions.   The patient was advised to call back or seek an in-person evaluation if the symptoms  worsen or if the condition fails to improve as anticipated.  I provided 14 minutes of non-face-to-face time during this encounter.   Freeman Caldron, PA-C  Patient ID: Lorraine Ryan, female   DOB: May 11, 1955, 65 y.o.   MRN: [id number]

## 2020-04-10 ENCOUNTER — Other Ambulatory Visit: Payer: Self-pay | Admitting: Physician Assistant

## 2020-04-10 DIAGNOSIS — R600 Localized edema: Secondary | ICD-10-CM

## 2020-04-25 ENCOUNTER — Ambulatory Visit: Payer: Self-pay | Admitting: *Deleted
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
## 2020-04-27 ENCOUNTER — Telehealth: Payer: Self-pay
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
## 2020-05-11 ENCOUNTER — Other Ambulatory Visit (HOSPITAL_COMMUNITY): Payer: Self-pay | Admitting: Psychiatry

## 2020-05-11 DIAGNOSIS — F319 Bipolar disorder, unspecified: Secondary | ICD-10-CM

## 2020-05-11 DIAGNOSIS — F419 Anxiety disorder, unspecified: Secondary | ICD-10-CM

## 2020-05-17 ENCOUNTER — Other Ambulatory Visit (HOSPITAL_COMMUNITY): Payer: Self-pay | Admitting: Psychiatry

## 2020-05-17 DIAGNOSIS — F319 Bipolar disorder, unspecified: Secondary | ICD-10-CM

## 2020-05-21 ENCOUNTER — Other Ambulatory Visit: Payer: Self-pay | Admitting: Family Medicine

## 2020-05-21 DIAGNOSIS — M545 Low back pain, unspecified: Secondary | ICD-10-CM

## 2020-05-23 ENCOUNTER — Ambulatory Visit: Payer: Medicare Other | Admitting: Family Medicine

## 2020-05-28 ENCOUNTER — Other Ambulatory Visit: Payer: Self-pay | Admitting: Family Medicine

## 2020-05-28 DIAGNOSIS — K219 Gastro-esophageal reflux disease without esophagitis: Secondary | ICD-10-CM

## 2020-06-13 ENCOUNTER — Other Ambulatory Visit: Payer: Self-pay

## 2020-06-13 ENCOUNTER — Encounter (HOSPITAL_COMMUNITY): Payer: Self-pay | Admitting: Psychiatry

## 2020-06-13 ENCOUNTER — Telehealth (INDEPENDENT_AMBULATORY_CARE_PROVIDER_SITE_OTHER): Payer: Medicare Other | Admitting: Psychiatry

## 2020-06-13 DIAGNOSIS — F419 Anxiety disorder, unspecified: Secondary | ICD-10-CM | POA: Diagnosis not present

## 2020-06-13 DIAGNOSIS — F319 Bipolar disorder, unspecified: Secondary | ICD-10-CM

## 2020-06-13 NOTE — Progress Notes (Signed)
Virtual Visit via Telephone Note  I connected with Lorraine Ryan on 06/13/20 at 10:00 AM EDT by telephone and verified that I am speaking with the correct person using two identifiers.  Location: Patient: [facility] Provider: Home Office   I discussed the limitations, risks, security and privacy concerns of performing an evaluation and management service by telephone and the availability of in person appointments. I also discussed with the patient that there may be a patient responsible charge related to this service. The patient expressed understanding and agreed to proceed.   History of Present Illness: Patient was evaluated by phone session.  Patient told she had stopped taking the medication 4 weeks ago because she has no place to live.  Currently she is living in a motel but trying to find the place where she can stay.  She had not pick up the medication from the pharmacy because she does not want to take it unless she has a proper place.  I explained that she can keep the medication with her all the time to take regularly but patient refused that unless she does not have a proper home or place to live she will not take the medicine.  She reported not getting enough money from her disability check.  She does not want to stay with her son who she reports very busy and she does not get along with her son's wife.  She admitted irritability, anxiety and sometimes hallucinations but denies any suicidal thoughts.  She feels her paranoia and hallucinations are stable and reported does not get worse.  She understands that she needs medication but currently she is trying to find a place for herself.  She like to continue meeting with [employer] which she usually enjoys.  She does not like crowded places and try to stay by herself.  She like to keep the appointment in 4 weeks to give her an update about her mental health symptoms, medication and living situation.  Currently she denies any mania, impulsive  behavior.  Past Psychiatric History:Reviewed. No h/oinpatient treatment or any suicidal attempt. H/Oparanoia, hallucination, mania, anger and drug use. Seen in the past at [facility] and then [facility] and prescribe Latudabut madesick. Tried Abilify but it was switched to Geodon. No h/oabuse, no history of panic attacks.   Psychiatric Specialty Exam: Physical Exam  Review of Systems  There were no vitals taken for this visit.There is no height or weight on file to calculate BMI.  General Appearance: NA  Eye Contact:  NA  Speech:  Slow  Volume:  Normal  Mood:  Anxious and Irritable  Affect:  NA  Thought Process:  Descriptions of Associations: Circumstantial  Orientation:  Full (Time, Place, and Person)  Thought Content:  Paranoid Ideation and Rumination  Suicidal Thoughts:  No  Homicidal Thoughts:  No  Memory:  Immediate;   Fair Recent;   Fair Remote;   Fair  Judgement:  Poor  Insight:  Lacking  Psychomotor Activity:  NA  Concentration:  Concentration: Fair and Attention Span: Fair  Recall:  [employer] of Knowledge:  Fair  Language:  Fair  Akathisia:  No  Handed:  Right  AIMS (if indicated):     Assets:  Communication Skills Desire for Improvement  ADL's:  Intact  Cognition:  WNL  Sleep:   fair      Assessment and Plan: Bipolar disorder type I.  Anxiety.  Discussed risk of noncompliance with medication.  Patient has history of noncompliance with medication and  follow-up.  Patient does not want to take the medicine until she had a place to live.  She is hoping to get a place so she can take the medicine.  I explained that noncompliant with medication may cause worsening of symptoms but patient insists having a place first so she can resume medication.  We discussed safety concerns and anytime having active suicidal thoughts or homicidal thoughts or symptoms started to get worse then she should call us immediately.  Patient agreed to keep appointment in 4 weeks.  No  new medication given at this appointment.  Discussed and provided psychoeducation, medication benefits and side effects.   Follow Up Instructions:    I discussed the assessment and treatment plan with the patient. The patient was provided an opportunity to ask questions and all were answered. The patient agreed with the plan and demonstrated an understanding of the instructions.   The patient was advised to call back or seek an in-person evaluation if the symptoms worsen or if the condition fails to improve as anticipated.  I provided 17 minutes of non-face-to-face time during this encounter.   Kathlee Nations, MD

## 2020-06-26 ENCOUNTER — Other Ambulatory Visit: Payer: Self-pay

## 2020-06-26 ENCOUNTER — Emergency Department (HOSPITAL_COMMUNITY)
Admission: EM | Admit: 2020-06-26 | Discharge: 2020-06-29 | Disposition: A | Discharge status: Home / Self Care | Payer: Medicare Other | Attending: Emergency Medicine | Admitting: Emergency Medicine

## 2020-06-26 DIAGNOSIS — F0390 Unspecified dementia without behavioral disturbance: Secondary | ICD-10-CM | POA: Diagnosis not present

## 2020-06-26 DIAGNOSIS — E1169 Type 2 diabetes mellitus with other specified complication: Secondary | ICD-10-CM | POA: Diagnosis not present

## 2020-06-26 DIAGNOSIS — F319 Bipolar disorder, unspecified: Secondary | ICD-10-CM

## 2020-06-26 DIAGNOSIS — B9689 Other specified bacterial agents as the cause of diseases classified elsewhere: Secondary | ICD-10-CM | POA: Insufficient documentation

## 2020-06-26 DIAGNOSIS — F250 Schizoaffective disorder, bipolar type: Secondary | ICD-10-CM

## 2020-06-26 DIAGNOSIS — Z20822 Contact with and (suspected) exposure to covid-19: Secondary | ICD-10-CM | POA: Insufficient documentation

## 2020-06-26 DIAGNOSIS — Z87891 Personal history of nicotine dependence: Secondary | ICD-10-CM | POA: Diagnosis not present

## 2020-06-26 DIAGNOSIS — N390 Urinary tract infection, site not specified: Secondary | ICD-10-CM | POA: Insufficient documentation

## 2020-06-26 DIAGNOSIS — Z79899 Other long term (current) drug therapy: Secondary | ICD-10-CM | POA: Diagnosis not present

## 2020-06-26 DIAGNOSIS — E785 Hyperlipidemia, unspecified: Secondary | ICD-10-CM | POA: Diagnosis not present

## 2020-06-26 DIAGNOSIS — I10 Essential (primary) hypertension: Secondary | ICD-10-CM | POA: Insufficient documentation

## 2020-06-26 DIAGNOSIS — Z046 Encounter for general psychiatric examination, requested by authority: Secondary | ICD-10-CM | POA: Diagnosis present

## 2020-06-26 DIAGNOSIS — Y900 Blood alcohol level of less than 20 mg/100 ml: Secondary | ICD-10-CM | POA: Diagnosis not present

## 2020-06-26 DIAGNOSIS — F259 Schizoaffective disorder, unspecified: Secondary | ICD-10-CM

## 2020-06-26 LAB — RESP PANEL BY RT-PCR (FLU A&B, COVID) ARPGX2
Influenza A by PCR: NEGATIVE
Influenza B by PCR: NEGATIVE
SARS Coronavirus 2 by RT PCR: NEGATIVE

## 2020-06-26 LAB — CBC WITH DIFFERENTIAL/PLATELET
Abs Immature Granulocytes: 0 10*3/uL (ref 0.00–0.07)
Basophils Absolute: 0 10*3/uL (ref 0.0–0.1)
Basophils Relative: 1 %
Eosinophils Absolute: 0.2 10*3/uL (ref 0.0–0.5)
Eosinophils Relative: 4 %
HCT: 41.7 % (ref 36.0–46.0)
Hemoglobin: 13.7 g/dL (ref 12.0–15.0)
Immature Granulocytes: 0 %
Lymphocytes Relative: 33 %
Lymphs Abs: 1.8 10*3/uL (ref 0.7–4.0)
MCH: 30.5 pg (ref 26.0–34.0)
MCHC: 32.9 g/dL (ref 30.0–36.0)
MCV: 92.9 fL (ref 80.0–100.0)
Monocytes Absolute: 0.5 10*3/uL (ref 0.1–1.0)
Monocytes Relative: 10 %
Neutro Abs: 2.8 10*3/uL (ref 1.7–7.7)
Neutrophils Relative %: 52 %
Platelets: 228 10*3/uL (ref 150–400)
RBC: 4.49 MIL/uL (ref 3.87–5.11)
RDW: 13.6 % (ref 11.5–15.5)
WBC: 5.3 10*3/uL (ref 4.0–10.5)
nRBC: 0 % (ref 0.0–0.2)

## 2020-06-26 LAB — COMPREHENSIVE METABOLIC PANEL
ALT: 23 U/L (ref 0–44)
AST: 27 U/L (ref 15–41)
Albumin: 3.7 g/dL (ref 3.5–5.0)
Alkaline Phosphatase: 81 U/L (ref 38–126)
Anion gap: 8 (ref 5–15)
BUN: 10 mg/dL (ref 8–23)
CO2: 27 mmol/L (ref 22–32)
Calcium: 9.3 mg/dL (ref 8.9–10.3)
Chloride: 107 mmol/L (ref 98–111)
Creatinine, Ser: 0.76 mg/dL (ref 0.44–1.00)
GFR, Estimated: >60 mL/min (ref >60)
Glucose, Bld: 96 mg/dL (ref 70–99)
Potassium: 3.4 mmol/L — ABNORMAL LOW (ref 3.5–5.1)
Sodium: 142 mmol/L (ref 135–145)
Total Bilirubin: 1 mg/dL (ref 0.3–1.2)
Total Protein: 6.9 g/dL (ref 6.5–8.1)

## 2020-06-26 LAB — URINALYSIS, ROUTINE W REFLEX MICROSCOPIC
Bilirubin Urine: NEGATIVE
Glucose, UA: NEGATIVE mg/dL
Ketones, ur: NEGATIVE mg/dL
Nitrite: NEGATIVE
Protein, ur: NEGATIVE mg/dL
Specific Gravity, Urine: 1.013 (ref 1.005–1.030)
pH: 8 (ref 5.0–8.0)

## 2020-06-26 LAB — RAPID URINE DRUG SCREEN, HOSP PERFORMED
Amphetamines: NOT DETECTED
Barbiturates: NOT DETECTED
Benzodiazepines: NOT DETECTED
Cocaine: NOT DETECTED
Opiates: NOT DETECTED
Tetrahydrocannabinol: NOT DETECTED

## 2020-06-26 LAB — ETHANOL: Alcohol, Ethyl (B): <10 mg/dL (ref <10)

## 2020-06-26 NOTE — ED Provider Notes (Signed)
[facility] DEPT Provider Note   CSN: [id number] Arrival date & time: 06/26/20  1747     History Chief Complaint  Patient presents with  . Medical Clearance    Lorraine Ryan is a 65 y.o. female.  HPI      Presents under IVC filled out by sister Arbie Cookey  She is a danger to harm herself and or others. She hears voices and talks to herself often and responds back to voices. She talks to herself abnout killing herself. She can be very aggressive, will spit on people and start fights. Not taking her meds. Walking the streets late at night, not bathing, not sleeping and may be diabetic"  Patient reports she is here because she was walking the streets late at night.  Denies SI/HI. Acknowledges hearing voices. Reports back pain, chronic. No trauma. Does report urinary frequency.  No incontinence, stool incontinency, numbness or weakness.  Denies other medical concerns.  Past Medical History:  Diagnosis Date  . Anxiety   . Arthritis   . Back pain   . Bipolar disorder ([place])   . Chronic headaches   . Dementia ([place])   . Depression   . Diabetes mellitus without complication ([place])   . GERD (gastroesophageal reflux disease)   . Hypertension    No meds prescribed; states intermittent  . Neuromuscular disorder ([place])   . Shortness of breath dyspnea   . Stroke [name])    caused numbness in leg    Patient Active Problem List   Diagnosis Date Noted  . Dysuria 06/28/2019  . Acute pain of right shoulder 03/03/2018  . Muscle spasm 03/03/2018  . Frequent PVCs 12/29/2017  . History of fall 10/23/2017  . Depression   . Anxiety   . Abdominal pain, epigastric 03/30/2015  . Dysphagia 03/30/2015  . History of lumbar laminectomy for spinal cord decompression 02/08/2015  . Hyperlipidemia 04/20/2014  . Chronic midline low back pain without sciatica 03/01/2013  . Numbness in right leg 03/01/2013  . Chest pain on exertion 03/01/2013  . Hypertension   . Stroke  [facility])     Past Surgical History:  Procedure Laterality Date  . ABDOMINAL HYSTERECTOMY     no cervix  . COLONOSCOPY WITH PROPOFOL N/A 04/18/2015   WUJ:[id number] hemorrhoids/diverticulosis sigmoid colon/  . ESOPHAGOGASTRODUODENOSCOPY (EGD) WITH PROPOFOL N/A 04/18/2015   SLF:web in the proximal esophagus/dilated/  . GANGLION CYST EXCISION    . LUMBAR LAMINECTOMY/DECOMPRESSION MICRODISCECTOMY N/A 02/08/2015   Procedure: L4-5 Decompression;  Surgeon: Marybelle Killings, MD;  Location: [facility];  Service: Orthopedics;  Laterality: N/A;  . POLYPECTOMY  04/18/2015   Procedure: POLYPECTOMY;  Surgeon: Danie Binder, MD;  Location: [facility];  Service: Endoscopy;;  ascending colon polyp  . TONSILLECTOMY       OB History   No obstetric history on file.     Family History  Problem Relation Age of Onset  . Diabetes Mother   . Hypertension Mother   . Heart disease Other        No family history  . Breast cancer Sister        diagnosed in her 26's  . Anxiety disorder Sister   . Depression Sister   . Anxiety disorder Brother   . Depression Brother   . Anxiety disorder Brother   . Depression Brother   . Cancer Maternal Grandmother        unknown type  . Colon cancer Neg Hx     Social History  Tobacco Use  . Smoking status: Former Smoker    Packs/day: 0.50    Years: 10.00    Pack years: 5.00    Types: Cigarettes    Quit date: 03/29/1985    Years since quitting: 35.2  . Smokeless tobacco: Never Used  Vaping Use  . Vaping Use: Never used  Substance Use Topics  . Alcohol use: No    Alcohol/week: 0.0 standard drinks    Comment: Used to drink heavily, no ETOH in "30-some years"  . Drug use: No    Comment: History of crack, "no drugs in 30-some years"    Home Medications Prior to Admission medications   Medication Sig Start Date End Date Taking? Authorizing Provider  DULoxetine (CYMBALTA) 60 MG capsule Take 1 capsule (60 mg total) by mouth daily. 03/16/20   Arfeen, Arlyce Harman, MD   furosemide (LASIX) 20 MG tablet Take 1 tablet (20 mg total) by mouth daily. X5 days prn swelling 03/30/20   Freeman Caldron M, PA-C  gabapentin (NEURONTIN) 300 MG capsule Take 1 capsule (300 mg total) by mouth 3 (three) times daily. 01/10/20   Charlott Rakes, MD  lisinopril-hydrochlorothiazide (ZESTORETIC) 20-12.5 MG tablet Take 2 tablets by mouth daily. 01/10/20   Charlott Rakes, MD  meloxicam (MOBIC) 7.5 MG tablet Take 1 tablet (7.5 mg total) by mouth daily. Prn pain 03/30/20   Argentina Donovan, PA-C  metoprolol succinate (TOPROL XL) 25 MG 24 hr tablet Take 1 tablet (25 mg total) by mouth daily. 03/11/19   Argentina Donovan, PA-C  omeprazole (PRILOSEC) 40 MG capsule Take 1 capsule (40 mg total) by mouth daily. 01/10/20   Charlott Rakes, MD  rosuvastatin (CRESTOR) 40 MG tablet TAKE 1 TABLET BY MOUTH  DAILY 10/20/19   Denice Paradise A, NP  tiZANidine (ZANAFLEX) 4 MG tablet TAKE 1 TABLET BY MOUTH EVERY 6 HOURS AS NEEDED FOR MUSCLE SPASM(S) 03/24/19   Charlott Rakes, MD  traZODone (DESYREL) 100 MG tablet Take one tab at bed time. 03/16/20   Arfeen, Arlyce Harman, MD  ziprasidone (GEODON) 80 MG capsule Take 1 capsule (80 mg total) by mouth at bedtime. 03/16/20   Kathlee Nations, MD    Allergies    Codeine, Oxycodone, Prednisone, and Tramadol  Review of Systems   Review of Systems  Constitutional: Negative for fever.  Eyes: Negative for visual disturbance.  Respiratory: Negative for cough and shortness of breath.   Cardiovascular: Negative for chest pain.  Gastrointestinal: Negative for abdominal pain, diarrhea, nausea and vomiting.  Genitourinary: Positive for frequency. Negative for difficulty urinating.  Musculoskeletal: Positive for back pain. Negative for neck pain.  Skin: Negative for rash.  Neurological: Negative for syncope and headaches.    Physical Exam Updated Vital Signs BP 120/65 (BP Location: Right Arm)   Pulse 76   Temp 97.8 F (36.6 C) (Oral)   Resp 20   Ht 5\' 6"  (1.676 m)   SpO2  95%   BMI 37.93 kg/m   Physical Exam Vitals and nursing note reviewed.  Constitutional:      General: She is not in acute distress.    Appearance: She is well-developed. She is not diaphoretic.  HENT:     Head: Normocephalic and atraumatic.  Eyes:     Conjunctiva/sclera: Conjunctivae normal.  Cardiovascular:     Rate and Rhythm: Normal rate and regular rhythm.     Heart sounds: Normal heart sounds. No murmur heard. No friction rub. No gallop.   Pulmonary:     Effort:  Pulmonary effort is normal. No respiratory distress.     Breath sounds: Normal breath sounds. No wheezing or rales.  Abdominal:     General: There is no distension.     Palpations: Abdomen is soft.     Tenderness: There is no abdominal tenderness. There is no guarding.  Musculoskeletal:        General: Tenderness (lower back) present.     Cervical back: Normal range of motion.  Skin:    General: Skin is warm and dry.     Findings: No erythema or rash.  Neurological:     Mental Status: She is alert and oriented to person, place, and time.     Comments: Normal strength bilateral LE     ED Results / Procedures / Treatments   Labs (all labs ordered are listed, but only abnormal results are displayed) Labs Reviewed  COMPREHENSIVE METABOLIC PANEL - Abnormal; Notable for the following components:      Result Value   Potassium 3.4 (*)    All other components within normal limits  URINALYSIS, ROUTINE W REFLEX MICROSCOPIC - Abnormal; Notable for the following components:   APPearance HAZY (*)    Hgb urine dipstick MODERATE (*)    Leukocytes,Ua TRACE (*)    Bacteria, UA RARE (*)    All other components within normal limits  RESP PANEL BY RT-PCR (FLU A&B, COVID) ARPGX2  ETHANOL  RAPID URINE DRUG SCREEN, HOSP PERFORMED  CBC WITH DIFFERENTIAL/PLATELET    EKG None  Radiology No results found.  Procedures Procedures   Medications Ordered in ED Medications  cephALEXin (KEFLEX) capsule 500 mg (has no  administration in time range)  metoprolol succinate (TOPROL-XL) 24 hr tablet 25 mg (has no administration in time range)  rosuvastatin (CRESTOR) tablet 40 mg (has no administration in time range)  ziprasidone (GEODON) capsule 80 mg (has no administration in time range)  pantoprazole (PROTONIX) EC tablet 40 mg (has no administration in time range)  DULoxetine (CYMBALTA) DR capsule 60 mg (has no administration in time range)    ED Course  I have reviewed the triage vital signs and the nursing notes.  Pertinent labs & imaging results that were available during my care of the patient were reviewed by me and considered in my medical decision making (see chart for details).    MDM Rules/Calculators/A&P                          65yo female with history of bipolar, anxiety who presents with IVC placed due to worsening hallucinations, agitation, talking to herself and talking about killing herself.  Reports back pain which has been ongoing, no red flags. Does report urinary symptoms and UA concerning for possible UTI.  Given keflex.    Medically cleared.  TTS consulted. First examination completed.     Final Clinical Impression(s) / ED Diagnoses Final diagnoses:  Bipolar 1 disorder ([place])  Schizoaffective disorder, bipolar type ([place])  Urinary tract infection without hematuria, site unspecified    Rx / DC Orders ED Discharge Orders    None       Gareth Morgan, MD 06/27/20 [phone]
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
## 2020-06-26 NOTE — ED Notes (Signed)
EDP at bedside  

## 2020-06-27 ENCOUNTER — Encounter (HOSPITAL_COMMUNITY): Payer: Self-pay | Admitting: Psychiatry

## 2020-06-27 ENCOUNTER — Emergency Department (HOSPITAL_COMMUNITY): Payer: Medicare Other

## 2020-06-27 DIAGNOSIS — F319 Bipolar disorder, unspecified: Secondary | ICD-10-CM

## 2020-06-27 DIAGNOSIS — F259 Schizoaffective disorder, unspecified: Secondary | ICD-10-CM

## 2020-06-27 HISTORY — DX: Schizoaffective disorder, unspecified: F25.9

## 2020-06-27 MED ORDER — RISPERIDONE 0.5 MG PO TABS
0.5000 mg | ORAL_TABLET | Freq: Once | ORAL | Status: AC
  Administered 2020-06-27: 0.5 mg via ORAL
  Filled 2020-06-27: qty 1

## 2020-06-27 MED ORDER — PANTOPRAZOLE SODIUM 40 MG PO TBEC
40.0000 mg | DELAYED_RELEASE_TABLET | Freq: Every day | ORAL | Status: DC
  Administered 2020-06-27 – 2020-06-29 (×3): 40 mg via ORAL
  Filled 2020-06-27 (×3): qty 1

## 2020-06-27 MED ORDER — CEPHALEXIN 500 MG PO CAPS
500.0000 mg | ORAL_CAPSULE | Freq: Three times a day (TID) | ORAL | Status: AC
  Administered 2020-06-27 – 2020-06-28 (×6): 500 mg via ORAL
  Filled 2020-06-27 (×7): qty 1

## 2020-06-27 MED ORDER — METOPROLOL SUCCINATE ER 50 MG PO TB24
25.0000 mg | ORAL_TABLET | Freq: Every day | ORAL | Status: DC
  Administered 2020-06-27 – 2020-06-29 (×3): 25 mg via ORAL
  Filled 2020-06-27 (×3): qty 1

## 2020-06-27 MED ORDER — QUETIAPINE FUMARATE 50 MG PO TABS
50.0000 mg | ORAL_TABLET | Freq: Every day | ORAL | Status: DC
  Administered 2020-06-27 – 2020-06-28 (×2): 50 mg via ORAL
  Filled 2020-06-27 (×2): qty 1

## 2020-06-27 MED ORDER — ROSUVASTATIN CALCIUM 20 MG PO TABS
40.0000 mg | ORAL_TABLET | Freq: Every day | ORAL | Status: DC
  Administered 2020-06-27 – 2020-06-28 (×2): 40 mg via ORAL
  Filled 2020-06-27 (×3): qty 2

## 2020-06-27 MED ORDER — ZIPRASIDONE HCL 20 MG PO CAPS
80.0000 mg | ORAL_CAPSULE | Freq: Every day | ORAL | Status: DC
  Administered 2020-06-27: 80 mg via ORAL
  Filled 2020-06-27: qty 4

## 2020-06-27 MED ORDER — DULOXETINE HCL 30 MG PO CPEP
60.0000 mg | ORAL_CAPSULE | Freq: Every day | ORAL | Status: DC
  Administered 2020-06-27 – 2020-06-29 (×3): 60 mg via ORAL
  Filled 2020-06-27 (×3): qty 2

## 2020-06-27 NOTE — ED Notes (Signed)
Pt took shower independently.

## 2020-06-27 NOTE — ED Notes (Signed)
Pt in room talking to people who are not there.

## 2020-06-27 NOTE — Consult Note (Signed)
[facility] Psychiatry Consult   Reason for Consult:  Psychiatric evaluation due to hallucinations  Referring Physician:  Dr. Kathrynn Humble  Patient Identification: Lorraine Ryan MRN:  [id number] Principal Diagnosis: Schizoaffective psychosis ([place]) Diagnosis:  Principal Problem:   Schizoaffective psychosis ([place])   Total Time spent with patient: 30 minutes  Subjective:   Lorraine Ryan is a 65 y.o. female patient presented to the [facility] by police.  Lorraine Ryan, 65 y.o., female patient seen face to face  by this provider, consulted with Dr. Dwyane [name]; and chart reviewed on 06/27/20.    HPI:    During evaluation Lorraine Ryan is in sitting position in no acute distress. Eye contact is minimal.  She is alert,anxious, oriented x 4 and cooperative.  Her mood is depressed with congruent affect. Patient is responding to internal stimuli, she is talking to her self while provider entered room.  Patient denies suicidal/self-harm/homicidal ideation. Patient endorses auditory hallucinations. States, "my voices talk to me and I talk back". Reports hallucinations are not command and they are not scary to her. States voices are constant. States she has visual hallucinations. States she can only describe them as "shadows". Patient endorses being paranoid. States she does not sleep because she is on the street and has to stay alert. Reports she only sleeps 30 minutes per night.  States she is homeless but she pays for a hotel at night, or sleeps in her truck when she has it. States she has no family to live with. Patient judgment and insight are poor.   Reports she sees [name], but has not followed up in a "while". States she stopped taking her medications so she can stay "awake and alert'. Patient states she has been diagnosed schizophrenic.   Collateral: [name] [phone]. States patient is homeless and stays in her truck, but her truck is in the shop. States patient gets a monthly check but  will not use it on living arrangements. States she has had auditory hallucinations for years. States that recently patient is not sleeping, bathing or doing anything to take care of herself. States patient is up at night walking the streets in downtown [place]. States patient is no longer able to make decisions that can keep her safe. Reports that patient has 5 sons. She is unable to live with any of them. States she has no family that she can live with.    Past Psychiatric History: GAD, Bipolar, schizoaffective   Risk to Self:  pt denies Risk to Others:  pt denies Prior Inpatient Therapy:  denies Prior Outpatient Therapy:  yes  Past Medical History:  Past Medical History:  Diagnosis Date  . Anxiety   . Arthritis   . Back pain   . Bipolar disorder ([place])   . Chronic headaches   . Dementia ([place])   . Depression   . Diabetes mellitus without complication ([place])   . GERD (gastroesophageal reflux disease)   . Hypertension    No meds prescribed; states intermittent  . Neuromuscular disorder ([place])   . Schizoaffective psychosis ([place]) 06/27/2020  . Shortness of breath dyspnea   . Stroke [facility])    caused numbness in leg    Past Surgical History:  Procedure Laterality Date  . ABDOMINAL HYSTERECTOMY     no cervix  . COLONOSCOPY WITH PROPOFOL N/A 04/18/2015   TMA:UQJFHLKT hemorrhoids/diverticulosis sigmoid colon/  . ESOPHAGOGASTRODUODENOSCOPY (EGD) WITH PROPOFOL N/A 04/18/2015   SLF:web in the proximal esophagus/dilated/  . GANGLION CYST EXCISION    . LUMBAR  LAMINECTOMY/DECOMPRESSION MICRODISCECTOMY N/A 02/08/2015   Procedure: L4-5 Decompression;  Surgeon: Marybelle Killings, MD;  Location: [facility];  Service: Orthopedics;  Laterality: N/A;  . POLYPECTOMY  04/18/2015   Procedure: POLYPECTOMY;  Surgeon: Danie Binder, MD;  [facility];  Service: Endoscopy;;  ascending colon polyp  . TONSILLECTOMY     Family History:  Family History  Problem Relation Age of Onset  . Diabetes Mother    . Hypertension Mother   . Heart disease Other        No family history  . Breast cancer Sister        diagnosed in her 18's  . Anxiety disorder Sister   . Depression Sister   . Anxiety disorder Brother   . Depression Brother   . Anxiety disorder Brother   . Depression Brother   . Cancer Maternal Grandmother        unknown type  . Colon cancer Neg Hx    Family Psychiatric  History: unkown Social History:  Social History   Substance and Sexual Activity  Alcohol Use No  . Alcohol/week: 0.0 standard drinks   Comment: Used to drink heavily, no ETOH in "30-some years"     Social History   Substance and Sexual Activity  Drug Use No   Comment: History of crack, "no drugs in 30-some years"    Social History   Socioeconomic History  . Marital status: Divorced    Spouse name: Not on file  . Number of children: 5  . Years of education: Not on file  . Highest education level: Associate degree: occupational, Hotel manager, or vocational program  Occupational History  . Not on file  Tobacco Use  . Smoking status: Former Smoker    Packs/day: 0.50    Years: 10.00    Pack years: 5.00    Types: Cigarettes    Quit date: 03/29/1985    Years since quitting: 35.2  . Smokeless tobacco: Never Used  Vaping Use  . Vaping Use: Never used  Substance and Sexual Activity  . Alcohol use: No    Alcohol/week: 0.0 standard drinks    Comment: Used to drink heavily, no ETOH in "30-some years"  . Drug use: No    Comment: History of crack, "no drugs in 30-some years"  . Sexual activity: Not Currently  Other Topics Concern  . Not on file  Social History Narrative   She is no longer living with [name]- her sone >1.5 years ago. [name] got married yest and pans to go into the [employer]. She no longer has a place to live.    She is living in her truck and plans to go to [place] to be with other family    She has other children in       Exercise: gets in the pool  and showers there   Diet: eats a  lot of fast food   Social Determinants of Health   Financial Resource Strain: Low Risk   . Difficulty of Paying Living Expenses: Not very hard  Food Insecurity: No Food Insecurity  . Worried About Charity fundraiser in the Last Year: Never true  . Ran Out of Food in the Last Year: Never true  Transportation Needs: No Transportation Needs  . Lack of Transportation (Medical): No  . Lack of Transportation (Non-Medical): No  Physical Activity: Not on file  Stress: No Stress Concern Present  . Feeling of Stress : Only a little  Social Connections: Not  on file   Additional Social History:    Allergies:   Allergies  Allergen Reactions  . Codeine Other (See Comments)    Headache  . Oxycodone Nausea Only and Palpitations  . Prednisone Other (See Comments)    Headache  . Tramadol Nausea Only    Labs:  Results for orders placed or performed during the hospital encounter of 06/26/20 (from the past 48 hour(s))  Urine rapid drug screen (hosp performed)     Status: None   Collection Time: 06/26/20  7:19 PM  Result Value Ref Range   Opiates NONE DETECTED NONE DETECTED   Cocaine NONE DETECTED NONE DETECTED   Benzodiazepines NONE DETECTED NONE DETECTED   Amphetamines NONE DETECTED NONE DETECTED   Tetrahydrocannabinol NONE DETECTED NONE DETECTED   Barbiturates NONE DETECTED NONE DETECTED    Comment: (NOTE) DRUG SCREEN FOR MEDICAL PURPOSES ONLY.  IF CONFIRMATION IS NEEDED FOR ANY PURPOSE, NOTIFY LAB WITHIN 5 DAYS.  LOWEST DETECTABLE LIMITS FOR URINE DRUG SCREEN Drug Class                     Cutoff (ng/mL) Amphetamine and metabolites    1000 Barbiturate and metabolites    200 Benzodiazepine                 696 Tricyclics and metabolites     300 Opiates and metabolites        300 Cocaine and metabolites        300 THC                            50 Performed at [facility], [place] [address] [place], [place] [address]   Resp Panel by RT-PCR (Flu A&B, Covid)  Nasopharyngeal Swab     Status: None   Collection Time: 06/26/20  8:30 PM   Specimen: Nasopharyngeal Swab; Nasopharyngeal(NP) swabs in vial transport medium  Result Value Ref Range   SARS Coronavirus 2 by RT PCR NEGATIVE NEGATIVE    Comment: (NOTE) SARS-CoV-2 target nucleic acids are NOT DETECTED.  The SARS-CoV-2 RNA is generally detectable in upper respiratory specimens during the acute phase of infection. The lowest concentration of SARS-CoV-2 viral copies this assay can detect is 138 copies/mL. A negative result does not preclude SARS-Cov-2 infection and should not be used as the sole basis for treatment or other patient management decisions. A negative result may occur with  improper specimen collection/handling, submission of specimen other than nasopharyngeal swab, presence of viral mutation(s) within the areas targeted by this assay, and inadequate number of viral copies(<138 copies/mL). A negative result must be combined with clinical observations, patient history, and epidemiological information. The expected result is Negative.  Fact Sheet for Patients:  EntrepreneurPulse.com.au  Fact Sheet for Healthcare Providers:  IncredibleEmployment.be  This test is no t yet approved or cleared by the [place] FDA and  has been authorized for detection and/or diagnosis of SARS-CoV-2 by FDA under an Emergency Use Authorization (EUA). This EUA will remain  in effect (meaning this test can be used) for the duration of the COVID-19 declaration under Section 564(b)(1) of the Act, 21 U.S.C.section 360bbb-3(b)(1), unless the authorization is terminated  or revoked sooner.       Influenza A by PCR NEGATIVE NEGATIVE   Influenza B by PCR NEGATIVE NEGATIVE    Comment: (NOTE) The Xpert Xpress SARS-CoV-2/FLU/RSV plus assay is intended as an aid in the diagnosis of influenza  from Nasopharyngeal swab specimens and should not be used as a sole basis for  treatment. Nasal washings and aspirates are unacceptable for Xpert Xpress SARS-CoV-2/FLU/RSV testing.  Fact Sheet for Patients: EntrepreneurPulse.com.au  Fact Sheet for Healthcare Providers: IncredibleEmployment.be  This test is not yet approved or cleared by the [place] FDA and has been authorized for detection and/or diagnosis of SARS-CoV-2 by FDA under an Emergency Use Authorization (EUA). This EUA will remain in effect (meaning this test can be used) for the duration of the COVID-19 declaration under Section 564(b)(1) of the Act, 21 U.S.C. section 360bbb-3(b)(1), unless the authorization is terminated or revoked.  Performed at [facility], [place] [address], [place], [place] [address]   Comprehensive metabolic panel     Status: Abnormal   Collection Time: 06/26/20  8:41 PM  Result Value Ref Range   Sodium 142 135 - 145 mmol/L   Potassium 3.4 (L) 3.5 - 5.1 mmol/L   Chloride 107 98 - 111 mmol/L   CO2 27 22 - 32 mmol/L   Glucose, Bld 96 70 - 99 mg/dL    Comment: Glucose reference range applies only to samples taken after fasting for at least 8 hours.   BUN 10 8 - 23 mg/dL   Creatinine, Ser 0.76 0.44 - 1.00 mg/dL   Calcium 9.3 8.9 - 10.3 mg/dL   Total Protein 6.9 6.5 - 8.1 g/dL   Albumin 3.7 3.5 - 5.0 g/dL   AST 27 15 - 41 U/L   ALT 23 0 - 44 U/L   Alkaline Phosphatase 81 38 - 126 U/L   Total Bilirubin 1.0 0.3 - 1.2 mg/dL   GFR, Estimated >60 >60 mL/min    Comment: (NOTE) Calculated using the CKD-EPI Creatinine Equation (2021)    Anion gap 8 5 - 15    Comment: Performed at [facility], [place] [address], [place], [place] [address]  Ethanol     Status: None   Collection Time: 06/26/20  8:41 PM  Result Value Ref Range   Alcohol, Ethyl (B) <10 <10 mg/dL    Comment: (NOTE) Lowest detectable limit for serum alcohol is 10 mg/dL.  For medical purposes only. Performed at [facility], [place] [address] [place], [place] [address]   CBC with Diff     Status: None   Collection Time: 06/26/20  8:41 PM  Result Value Ref Range   WBC 5.3 4.0 - 10.5 K/uL   RBC 4.49 3.87 - 5.11 MIL/uL   Hemoglobin 13.7 12.0 - 15.0 g/dL   HCT 41.7 36.0 - 46.0 %   MCV 92.9 80.0 - 100.0 fL   MCH 30.5 26.0 - 34.0 pg   MCHC 32.9 30.0 - 36.0 g/dL   RDW 13.6 11.5 - 15.5 %   Platelets 228 150 - 400 K/uL   nRBC 0.0 0.0 - 0.2 %   Neutrophils Relative % 52 %   Neutro Abs 2.8 1.7 - 7.7 K/uL   Lymphocytes Relative 33 %   Lymphs Abs 1.8 0.7 - 4.0 K/uL   Monocytes Relative 10 %   Monocytes Absolute 0.5 0.1 - 1.0 K/uL   Eosinophils Relative 4 %   Eosinophils Absolute 0.2 0.0 - 0.5 K/uL   Basophils Relative 1 %   Basophils Absolute 0.0 0.0 - 0.1 K/uL   Immature Granulocytes 0 %   Abs Immature Granulocytes 0.00 0.00 - 0.07 K/uL    Comment: Performed at [facility], [place] [address]  [place], [place] [address]  Urinalysis, Routine w reflex microscopic Urine, Random     Status: Abnormal   Collection Time: 06/26/20  9:00 PM  Result Value Ref Range   Color, Urine YELLOW YELLOW   APPearance HAZY (A) CLEAR   Specific Gravity, Urine 1.013 1.005 - 1.030   pH 8.0 5.0 - 8.0   Glucose, UA NEGATIVE NEGATIVE mg/dL   Hgb urine dipstick MODERATE (A) NEGATIVE   Bilirubin Urine NEGATIVE NEGATIVE   Ketones, ur NEGATIVE NEGATIVE mg/dL   Protein, ur NEGATIVE NEGATIVE mg/dL   Nitrite NEGATIVE NEGATIVE   Leukocytes,Ua TRACE (A) NEGATIVE   RBC / HPF 0-5 0 - 5 RBC/hpf   WBC, UA 11-20 0 - 5 WBC/hpf   Bacteria, UA RARE (A) NONE SEEN   Squamous Epithelial / LPF 6-10 0 - 5   Mucus PRESENT     Comment: Performed at [facility], [place] [address] [place], [place] [address]    Current Facility-Administered Medications  Medication Dose Route Frequency Provider Last Rate Last Admin  . cephALEXin (KEFLEX) capsule 500 mg  500 mg Oral Q8H Gareth Morgan, MD   500 mg at  06/27/20 0608  . DULoxetine (CYMBALTA) DR capsule 60 mg  60 mg Oral Daily Gareth Morgan, MD   60 mg at 06/27/20 0915  . metoprolol succinate (TOPROL-XL) 24 hr tablet 25 mg  25 mg Oral Daily Gareth Morgan, MD   25 mg at 06/27/20 0915  . pantoprazole (PROTONIX) EC tablet 40 mg  40 mg Oral Daily Gareth Morgan, MD   40 mg at 06/27/20 0915  . rosuvastatin (CRESTOR) tablet 40 mg  40 mg Oral Daily Gareth Morgan, MD   40 mg at 06/27/20 0915  . ziprasidone (GEODON) capsule 80 mg  80 mg Oral QHS Gareth Morgan, MD   80 mg at 06/27/20 0145   Current Outpatient Medications  Medication Sig Dispense Refill  . DULoxetine (CYMBALTA) 60 MG capsule Take 1 capsule (60 mg total) by mouth daily. 90 capsule 0  . furosemide (LASIX) 20 MG tablet Take 1 tablet (20 mg total) by mouth daily. X5 days prn swelling 30 tablet 0  . gabapentin (NEURONTIN) 300 MG capsule Take 1 capsule (300 mg total) by mouth 3 (three) times daily. 270 capsule 1  . lisinopril-hydrochlorothiazide (ZESTORETIC) 20-12.5 MG tablet Take 2 tablets by mouth daily. 180 tablet 1  . meloxicam (MOBIC) 7.5 MG tablet Take 1 tablet (7.5 mg total) by mouth daily. Prn pain 30 tablet 3  . metoprolol succinate (TOPROL XL) 25 MG 24 hr tablet Take 1 tablet (25 mg total) by mouth daily. 90 tablet 3  . omeprazole (PRILOSEC) 40 MG capsule Take 1 capsule (40 mg total) by mouth daily. 90 capsule 1  . rosuvastatin (CRESTOR) 40 MG tablet TAKE 1 TABLET BY MOUTH  DAILY 90 tablet 2  . tiZANidine (ZANAFLEX) 4 MG tablet TAKE 1 TABLET BY MOUTH EVERY 6 HOURS AS NEEDED FOR MUSCLE SPASM(S) 60 tablet 0  . traZODone (DESYREL) 100 MG tablet Take one tab at bed time. 90 tablet 0  . ziprasidone (GEODON) 80 MG capsule Take 1 capsule (80 mg total) by mouth at bedtime. 90 capsule 0    Musculoskeletal: Strength & Muscle Tone: within normal limits Gait & Station: normal Patient leans: Right and N/A   Psychiatric Specialty Exam:  Presentation  General Appearance:  Disheveled  Eye Contact:Minimal  Speech:Clear and Coherent; Normal Rate  Speech Volume:Normal  Handedness:Right   Mood and Affect  Mood:Anxious  Affect:Congruent   Thought Process  Thought Processes:Coherent  Descriptions of Associations:Intact  Orientation:Full (Time, Place and Person)  Thought Content:Illogical  History of Schizophrenia/Schizoaffective disorder:Yes  Duration of Psychotic Symptoms:Greater than six months  Hallucinations:Hallucinations: Auditory Description of Auditory Hallucinations: states she has coversations with the voices she hears.  Ideas of Reference:Paranoia  Suicidal Thoughts:Suicidal Thoughts: No  Homicidal Thoughts:Homicidal Thoughts: No   Sensorium  Memory:Immediate Good; Recent Good; Remote Good  Judgment:Poor  Insight:Poor   Executive Functions  Concentration:Good  Attention Span:Good  [place] of Knowledge:Good  Language:Good   Psychomotor Activity  Psychomotor Activity:Psychomotor Activity: Normal   Assets  Assets:Communication Skills; Desire for Improvement; Financial Resources/Insurance; Housing; Leisure Time; Physical Health; Resilience   Sleep  Sleep:Sleep: Poor   Physical Exam: Physical Exam Vitals reviewed.  Constitutional:      Appearance: Normal appearance.  HENT:     Head: Normocephalic.     Right Ear: External ear normal.     Left Ear: External ear normal.     Mouth/Throat:     Pharynx: Oropharynx is clear.  Eyes:     Conjunctiva/sclera: Conjunctivae normal.  Cardiovascular:     Rate and Rhythm: Normal rate.     Pulses: Normal pulses.  Pulmonary:     Effort: Pulmonary effort is normal.  Abdominal:     Tenderness: There is no guarding.  Musculoskeletal:        General: Normal range of motion.     Cervical back: Normal range of motion.  Skin:    General: Skin is warm and dry.     Capillary Refill: Capillary refill takes less than 2 seconds.  Neurological:     Mental  Status: She is alert and oriented to person, place, and time.  Psychiatric:        Attention and Perception: Attention normal. She perceives auditory hallucinations.        Mood and Affect: Mood is anxious and depressed.        Speech: Speech normal.        Thought Content: Thought content is paranoid.        Cognition and Memory: Cognition normal.        Judgment: Judgment is impulsive.    Review of Systems  Constitutional: Negative.   HENT: Negative.   Eyes: Negative.   Respiratory: Negative.   Cardiovascular: Negative.   Gastrointestinal: Negative.   Genitourinary: Negative.   Musculoskeletal: Negative.   Skin: Negative.   Neurological: Negative.   Endo/Heme/Allergies: Negative.   Psychiatric/Behavioral: Positive for depression and hallucinations. The patient is nervous/anxious.    Blood pressure (!) 136/94, pulse 82, temperature 97.7 F (36.5 C), temperature source Oral, resp. rate 19, height 5\' 6"  (1.676 m), SpO2 99 %. Body mass index is 37.93 kg/m.  Treatment Plan Summary: Daily contact with patient to assess and evaluate symptoms and progress in treatment and Medication management   Initiated: Seroquel 50 mg PO QHS, EKG results were reviewed with Dr. Dwyane [name].  Disposition: Recommend Geropsych Inpatient admission when medically cleared.  Revonda Humphrey, NP 06/27/2020 11:48 AM

## 2020-06-27 NOTE — ED Notes (Signed)
Pt is talking to herself using profanity

## 2020-06-27 NOTE — BH Assessment (Signed)
[facility] Assessment Progress Note  Per Thomes Lolling, NP, this pt requires psychiatric hospitalization at this time.  Pt presents under IVC initiated by pt's sister and upheld by EDP Gareth Morgan, MD.  The following facilities have been contacted to seek placement for this pt, with results as noted:  Beds available, information sent, decision pending: [facility] system ([place], [place], [place]) [facility] ([place], [place]) [place] to reach: [facility]  At capacity: [facility] (unit currently closed [facility] (unit currently closed)   Jalene Mullet, [place] Coordinator [phone]

## 2020-06-27 NOTE — BH Assessment (Signed)
Comprehensive Clinical Assessment (CCA) Note  06/27/2020 Lorraine Ryan [id number] Disposition: Clinician discussed patient care with Lorraine Angel, NP.  She recommends observation of patient overnight.  Clinician informed RN [name] about disposition recommendation via secure messaging.    Pt says she was over at her sister's house when the police picked her up to bring her in. She does not understand why she was brought in. Pt is on IVC. Pt denies any SI or HI. She says she hears voices. Pt reportedly has been talking to herself about killing herself. Patient says she stopped taking her medications because it makes her too sleepy and she is on the street.  Pt is on IVC. She is homeless in [place] and has not been taking her medication "for ahile." Last night (05/22) her niece picked her up (from the street) and brought her to niece's house throughout the day. Pt denies any SI or HI. She hears voices and sometimes sees shadow. Pt reportedly talks to herself about killing herself. Pt does appear to be responding to internal stimuli. Pt says she is homeless and she is too sleepy when she is on medication and worries about her safety if she is too sleepy. Patient says she keeps to herself when she is on the street. Pt says she wants to have her own residence so that she can start back on medication and be off the streets.  Patient has fair eye contact and is oriented x4.  She is responding to internal stimuli.  Patient mumbles as if she is responding voices.  Patient does not show any delusional thought process.  Patient says she keeps to herself as much as she can.  She is able to answer questions clearly and coherently.  Pt reports appetite to be normal.  She says that she sleeps well when she has a place to lay down.    Pt has not had previous inpatient care.  She last saw Dr. Adele Ryan on 05/10.  Chief Complaint:  Chief Complaint  Patient presents with  . Medical Clearance   Visit  Diagnosis: Schizoaffective d/o   CCA Screening, Triage and Referral (STR)  Patient Reported Information How did you hear about [place]? Legal System (Pt was brought in by police.)  Referral name: No data recorded Referral phone number: No data recorded  Whom do you see for routine medical problems? I don't have a doctor (Pt has an appointment on 05/25 regarding primary care.)  Practice/Facility Name: No data recorded Practice/Facility Phone Number: No data recorded Name of Contact: No data recorded Contact Number: No data recorded Contact Fax Number: No data recorded Prescriber Name: No data recorded Prescriber Address (if known): No data recorded  What Is the Reason for Your Visit/Call Today? Pt says she was over at her sister's house when the police picked her up to bring her in.  She does not understand why she was brought in.  Pt is on IVC.  Pt denies any SI or HI.  She says she hears voices.  Pt reportedly has been talking to herself about killing herself.  Patient says she stopped taking her medications because it makes her too sleepy and she is on the street.  How Long Has This Been Causing You Problems? > than 6 months  What Do You Feel Would Help You the Most Today? Medication(s)   Have You Recently Been in Any Inpatient Treatment (Hospital/Detox/Crisis Center/28-Day Program)? No  Name/Location of Program/Hospital:No data recorded How Long Were You There? No data  recorded When Were You Discharged? No data recorded  Have You Ever Received Services From [facility] Before? Yes  Who Do You See at [facility]? Pt sees [name] at [facility] outpatient.   Have You Recently Had Any Thoughts About Hurting Yourself? No  Are You Planning to Commit Suicide/Harm Yourself At This time? No data recorded  Have you Recently Had Thoughts About [place]? No  Explanation: No data recorded  Have You Used Any Alcohol or Drugs in the Past 24 Hours? No  How Long Ago  Did You Use Drugs or Alcohol? No data recorded What Did You Use and How Much? No data recorded  Do You Currently Have a Therapist/Psychiatrist? Yes  Name of Therapist/Psychiatrist: Dr. Adele Ryan   Have You Been Recently Discharged From Any Office Practice or Programs? No  Explanation of Discharge From Practice/Program: No data recorded    CCA Screening Triage Referral Assessment Type of Contact: Tele-Assessment  Is this Initial or Reassessment? Initial Assessment  Date Telepsych consult ordered in [facility]:  06/26/2020  Time Telepsych consult ordered in [facility]:  [id number]   Patient Reported Information Reviewed? Yes  Patient Left Without Being Seen? No data recorded Reason for Not Completing Assessment: No data recorded  Collateral Involvement: No data recorded  Does Patient Have a Belle? No data recorded Name and Contact of Legal Guardian: No data recorded If Minor and Not Living with Parent(s), Who has Custody? No data recorded Is CPS involved or ever been involved? Never  Is APS involved or ever been involved? Never   Patient Determined To Be At Risk for Harm To Self or Others Based on Review of Patient Reported Information or Presenting Complaint? No  Method: No data recorded Availability of Means: No data recorded Intent: No data recorded Notification Required: No data recorded Additional Information for Danger to Others Potential: No data recorded Additional Comments for Danger to Others Potential: No data recorded Are There Guns or Other Weapons in Your Home? No data recorded Types of Guns/Weapons: No data recorded Are These Weapons Safely Secured?                            No data recorded Who Could Verify You Are Able To Have These Secured: No data recorded Do You Have any Outstanding Charges, Pending Court Dates, Parole/Probation? No data recorded Contacted To Inform of Risk of Harm To Self or Others: No data recorded  Location of Assessment: [facility]  ED   Does Patient Present under Involuntary Commitment? Yes  IVC Papers Initial File Date: 06/26/2020   [place] of Residence: Kathleen Argue (Homeless in [place])   Patient Currently Receiving the Following Services: No data recorded  Determination of Need: Urgent (48 hours)   Options For Referral: Other: Comment (Pt needs to be observed overnight.)     CCA Biopsychosocial Intake/Chief Complaint:  Pt is on IVC.  She is homeless in [place] and has not been taking her medication "for ahile."  Last night (05/22) her niece picked her up (from the street) and brought her to niece's house throughout the day.  Pt denies any SI or HI.  She hears voices and sometimes sees shadow.  Pt reportedly talks to herself about killing herself.  Pt does appear to be responding to internal stimuli.  Pt says she is homeless and she is too sleepy when she is on medication and worries about her safety if she is  too sleepy.  Patient says she keeps to herself when she is on the street.  Pt says she wants to have her own residence so that she can start back on medicaion and be off streets.  Current Symptoms/Problems: Pt off medication and is responding to internal stimuli.   Patient Reported Schizophrenia/Schizoaffective Diagnosis in Past: Yes   Strengths: No data recorded Preferences: No data recorded Abilities: No data recorded  Type of Services Patient Feels are Needed: No data recorded  Initial Clinical Notes/Concerns: No data recorded  Mental Health Symptoms Depression:  None   Duration of Depressive symptoms: No data recorded  Mania:  No data recorded  Anxiety:   Difficulty concentrating; Tension; Worrying; Sleep   Psychosis:  Hallucinations   Duration of Psychotic symptoms: Greater than six months   Trauma:  None   Obsessions:  None   Compulsions:  None   Inattention:  None   Hyperactivity/Impulsivity:  N/A   Oppositional/Defiant Behaviors:  None   Emotional Irregularity:   None   Other Mood/Personality Symptoms:  No data recorded   Mental Status Exam Appearance and self-care  Stature:  Average   Weight:  Obese   Clothing:  No data recorded  Grooming:  Neglected   Cosmetic use:  None   Posture/gait:  Normal   Motor activity:  Not Remarkable   Sensorium  Attention:  Normal   Concentration:  Normal   Orientation:  X5   Recall/memory:  Normal   Affect and Mood  Affect:  Flat   Mood:  Euthymic   Relating  Eye contact:  No data recorded  Facial expression:  Responsive   Attitude toward examiner:  Cooperative   Thought and Language  Speech flow: Normal   Thought content:  Appropriate to Mood and Circumstances   Preoccupation:  Ruminations   Hallucinations:  Auditory; Visual (Somtimes sees shadows.)   Organization:  No data recorded  [employer] of Knowledge:  Average   Intelligence:  Average   Abstraction:  Normal   Judgement:  Fair   Art therapist:  Variable   Insight:  Poor   Decision Making:  Normal   Social Functioning  Social Maturity:  Impulsive   Social Judgement:  Normal   Stress  Stressors:  Housing; Teacher, music Ability:  Deficient supports   Skill Deficits:  Interpersonal   Supports:  Family     Religion:    Leisure/Recreation:    Exercise/Diet: Exercise/Diet Have You Gained or Lost A Significant Amount of Weight in the Past Six Months?: No Do You Have Any Trouble Sleeping?: No (No trouble "once I lay down>")   CCA Employment/Education Employment/Work Situation: Employment / Work Situation Employment situation: On disability Why is patient on disability: Mental health How long has patient been on disability: "Over 5 years now." Has patient ever been in the [employer]?: No  Education: Education Did Teacher, adult education From [employer]?: No   CCA Family/Childhood History Family and Relationship History: Family history Marital status: Single Does patient have  children?: Yes How many children?: 5 How is patient's relationship with their children?: Pt has no relationship with her boys.  When asked if they were involved with her she says "no"  Childhood History:  Childhood History Does patient have siblings?: Yes Number of Siblings: 6 Did patient suffer any verbal/emotional/physical/sexual abuse as a child?: No Did patient suffer from severe childhood neglect?: No Has patient ever been sexually abused/assaulted/raped as an adolescent or adult?: No Was the patient  ever a victim of a crime or a disaster?: No Has patient been affected by domestic violence as an adult?: No  Child/Adolescent Assessment:     CCA Substance Use Alcohol/Drug Use: Alcohol / Drug Use Pain Medications: Denies Prescriptions: Patient says she has been going without her meds "for awhile now.' Over the Counter: None History of alcohol / drug use?: No history of alcohol / drug abuse                         ASAM's:  Six Dimensions of Multidimensional Assessment  Dimension 1:  Acute Intoxication and/or Withdrawal Potential:      Dimension 2:  Biomedical Conditions and Complications:      Dimension 3:  Emotional, Behavioral, or Cognitive Conditions and Complications:     Dimension 4:  Readiness to Change:     Dimension 5:  Relapse, Continued use, or Continued Problem Potential:     Dimension 6:  Recovery/Living Environment:     ASAM Severity Score:    ASAM Recommended Level of Treatment:     Substance use Disorder (SUD)    Recommendations for Services/Supports/Treatments:    DSM5 Diagnoses: Patient Active Problem List   Diagnosis Date Noted  . Dysuria 06/28/2019  . Acute pain of right shoulder 03/03/2018  . Muscle spasm 03/03/2018  . Frequent PVCs 12/29/2017  . History of fall 10/23/2017  . Depression   . Anxiety   . Abdominal pain, epigastric 03/30/2015  . Dysphagia 03/30/2015  . History of lumbar laminectomy for spinal cord decompression  02/08/2015  . Hyperlipidemia 04/20/2014  . Chronic midline low back pain without sciatica 03/01/2013  . Numbness in right leg 03/01/2013  . Chest pain on exertion 03/01/2013  . Hypertension   . Stroke [facility])     Patient Centered Plan: Patient is on the following Treatment Plan(s):  Anxiety   Referrals to Alternative Service(s): Referred to Alternative Service(s):   Place:   Date:   Time:    Referred to Alternative Service(s):   Place:   Date:   Time:    Referred to Alternative Service(s):   Place:   Date:   Time:    Referred to Alternative Service(s):   Place:   Date:   Time:     [place] Session

## 2020-06-27 NOTE — ED Notes (Addendum)
Patient in room having AH.  She remains calm and cooperative.

## 2020-06-28 NOTE — BH Assessment (Signed)
[place] Assessment Progress Note   Per Thomes Lolling, NP, this involuntary pt continues to require psychiatric hospitalization at this time.  The following facilities have been contacted to seek placement for this pt, with results as noted:  Beds available, information sent, decision pending: [facility] (re-referral) [place]: [facility]  Unable to reach: [facility] (about 5/24/ referral; left message at 10:45) [facility]  (about 5/24/ referral; left message at 10:48)  At capacity: [facility] (unit currently closed) [facility] (unit currently closed)   Jalene Mullet, [place] Coordinator [phone]

## 2020-06-28 NOTE — BH Assessment (Addendum)
[place] Assessment Progress Note  Per Thomes Lolling, NP, this pt requires psychiatric hospitalization at this time.  Pt presents under IVC initiated by pt's sister, which EDP Gareth Morgan, MD has upheld.  At [place] calls from [facility] to report that pt has been accepted to their main campus by Dr Jonelle Sports.  They will be ready to receive pt tomorrow, 06/29/2020 after 08:00.  Hoyle Sauer concurs with this decision.  EDP Milton Ferguson, MD and pt's nurse, Jenny Reichmann, have been notified, and Jenny Reichmann agrees to call report to [phone].  Pt is to be transported via [facility] when the time comes.  [place] Coordinator [phone]

## 2020-06-28 NOTE — ED Provider Notes (Signed)
Emergency Medicine Observation Re-evaluation Note  Lorraine Ryan is a 65 y.o. female, seen on rounds today.  Pt initially presented to the ED for complaints of Medical Clearance Currently, the patient is sleeping.  Physical Exam  BP (!) 145/74 (BP Location: Left Arm)   Pulse 77   Temp 97.8 F (36.6 C) (Oral)   Resp 18   Ht 5\' 6"  (1.676 m)   SpO2 98%   BMI 37.93 kg/m  Physical Exam General: NAD Cardiac: normal rate Lungs: no resp distress, tachypnea Psych: intermittently agitated  ED Course / MDM  EKG:EKG Interpretation  Date/Time:  Monday Jun 26 2020 21:59:28 EDT Ventricular Rate:  86 PR Interval:  158 QRS Duration: 102 QT Interval:  412 QTC Calculation: 493 R Axis:   69 Text Interpretation: Sinus rhythm with frequent Premature ventricular complexes Prolonged QT Abnormal ECG When compared with ECG of 10/23/2017, QT has lengthened Confirmed by Delora Fuel ([id number]) on 06/27/2020 6:58:14 AM   I have reviewed the labs performed to date as well as medications administered while in observation.  Recent changes in the last 24 hours include intermittently agitated.  Plan  Current plan is for placement. Patient is under full IVC at this time.   Blanchie Dessert, MD 06/28/20 [phone]

## 2020-06-29 MED ORDER — ACETAMINOPHEN 500 MG PO TABS
1000.0000 mg | ORAL_TABLET | Freq: Once | ORAL | Status: AC
  Administered 2020-06-29: 1000 mg via ORAL
  Filled 2020-06-29: qty 2

## 2020-06-29 NOTE — ED Notes (Addendum)
[place] Department for transportation. Called report to [place]

## 2020-06-29 NOTE — ED Notes (Signed)
Returned 2 belonging bags to patient. She looked through the bags and confirmed all her belongings present. [facility] Sheriff Wynetta Emery transporting pt now.

## 2020-07-14 ENCOUNTER — Other Ambulatory Visit: Payer: Self-pay

## 2020-07-14 ENCOUNTER — Telehealth (HOSPITAL_COMMUNITY): Payer: Medicare Other | Admitting: Psychiatry

## 2020-07-25 ENCOUNTER — Other Ambulatory Visit: Payer: Self-pay

## 2020-07-25 ENCOUNTER — Encounter (HOSPITAL_COMMUNITY): Payer: Medicare Other | Admitting: Psychiatry

## 2020-07-25 ENCOUNTER — Encounter (HOSPITAL_COMMUNITY): Payer: Self-pay

## 2020-07-25 NOTE — Progress Notes (Signed)
No show

## 2020-08-02 ENCOUNTER — Other Ambulatory Visit: Payer: Self-pay | Admitting: Family Medicine

## 2020-08-02 ENCOUNTER — Other Ambulatory Visit (HOSPITAL_COMMUNITY): Payer: Self-pay | Admitting: Psychiatry

## 2020-08-02 DIAGNOSIS — F419 Anxiety disorder, unspecified: Secondary | ICD-10-CM

## 2020-08-02 DIAGNOSIS — F319 Bipolar disorder, unspecified: Secondary | ICD-10-CM

## 2020-08-02 DIAGNOSIS — G8929 Other chronic pain: Secondary | ICD-10-CM

## 2020-08-02 DIAGNOSIS — K219 Gastro-esophageal reflux disease without esophagitis: Secondary | ICD-10-CM

## 2020-08-02 NOTE — Telephone Encounter (Signed)
   Notes to clinic:  Patient was due for follow up 04/2020 Review for refill    Requested Prescriptions  Pending Prescriptions Disp Refills   omeprazole (PRILOSEC) 40 MG capsule [Pharmacy Med Name: Omeprazole 40 MG Oral Capsule Delayed Release] 90 capsule 3    Sig: TAKE 1 CAPSULE BY MOUTH  DAILY      Gastroenterology: Proton Pump Inhibitors Passed - 08/02/2020  7:22 AM      Passed - Valid encounter within last 12 months    Recent Outpatient Visits           4 months ago Pedal edema   [place], [place], [place]   6 months ago Essential hypertension   [place], Charlane Ferretti, MD   1 year ago Pain and swelling of right lower leg   [place], [place], MD   1 year ago No-show for appointment   [place], Deborah B, MD   1 year ago Hyperlipidemia, unspecified hyperlipidemia type   [place], Levada Dy M, [place]                  gabapentin (NEURONTIN) 300 MG capsule [Pharmacy Med Name: Gabapentin 300 MG Oral Capsule] 270 capsule 3    Sig: TAKE 1 CAPSULE BY MOUTH 3  TIMES DAILY      Neurology: Anticonvulsants - gabapentin Passed - 08/02/2020  7:22 AM      Passed - Valid encounter within last 12 months    Recent Outpatient Visits           4 months ago Pedal edema   [place], [place], [place]   6 months ago Essential hypertension   [place], Charlane Ferretti, MD   1 year ago Pain and swelling of right lower leg   [place] Fulp, [place], MD   1 year ago No-show for appointment   [place], MD   1 year ago Hyperlipidemia, unspecified hyperlipidemia type   [place], [place], [place]

## 2020-08-15 DIAGNOSIS — H04123 Dry eye syndrome of bilateral lacrimal glands: Secondary | ICD-10-CM | POA: Diagnosis not present

## 2020-08-15 DIAGNOSIS — H52223 Regular astigmatism, bilateral: Secondary | ICD-10-CM | POA: Diagnosis not present

## 2020-08-15 DIAGNOSIS — H43393 Other vitreous opacities, bilateral: Secondary | ICD-10-CM | POA: Diagnosis not present

## 2020-08-15 DIAGNOSIS — H5203 Hypermetropia, bilateral: Secondary | ICD-10-CM | POA: Diagnosis not present

## 2020-08-15 DIAGNOSIS — H524 Presbyopia: Secondary | ICD-10-CM | POA: Diagnosis not present

## 2020-08-31 ENCOUNTER — Telehealth (HOSPITAL_COMMUNITY): Payer: Self-pay | Admitting: Psychiatry

## 2020-08-31 DIAGNOSIS — I10 Essential (primary) hypertension: Secondary | ICD-10-CM | POA: Diagnosis not present

## 2020-08-31 DIAGNOSIS — R600 Localized edema: Secondary | ICD-10-CM | POA: Diagnosis not present

## 2020-08-31 DIAGNOSIS — E119 Type 2 diabetes mellitus without complications: Secondary | ICD-10-CM | POA: Diagnosis not present

## 2020-08-31 NOTE — Telephone Encounter (Signed)
Fax received from [facility].  Discharge on June 7th on Haldol Decanoate, Neurontin and Cymbalta.  Patient has no upcoming appointment in our office.

## 2020-09-01 DIAGNOSIS — R600 Localized edema: Secondary | ICD-10-CM | POA: Diagnosis not present

## 2020-09-01 DIAGNOSIS — L209 Atopic dermatitis, unspecified: Secondary | ICD-10-CM | POA: Diagnosis not present

## 2020-09-01 DIAGNOSIS — E785 Hyperlipidemia, unspecified: Secondary | ICD-10-CM | POA: Diagnosis not present

## 2020-09-01 DIAGNOSIS — I10 Essential (primary) hypertension: Secondary | ICD-10-CM | POA: Diagnosis not present

## 2020-09-12 DIAGNOSIS — M545 Low back pain, unspecified: Secondary | ICD-10-CM | POA: Diagnosis not present

## 2020-09-12 DIAGNOSIS — M7989 Other specified soft tissue disorders: Secondary | ICD-10-CM | POA: Diagnosis not present

## 2020-09-13 ENCOUNTER — Ambulatory Visit: Payer: Medicare Other

## 2020-09-25 ENCOUNTER — Other Ambulatory Visit: Payer: Self-pay | Admitting: Family Medicine

## 2020-09-25 ENCOUNTER — Other Ambulatory Visit: Payer: Self-pay | Admitting: Physician Assistant

## 2020-09-25 DIAGNOSIS — G8929 Other chronic pain: Secondary | ICD-10-CM

## 2020-09-25 NOTE — Telephone Encounter (Signed)
   Notes to clinic:   medication last filled on 03/27/2020 Review for continued use and refill   Requested Prescriptions  Pending Prescriptions Disp Refills   gabapentin (NEURONTIN) 300 MG capsule [Pharmacy Med Name: Gabapentin 300 MG Oral Capsule] 270 capsule 3    Sig: TAKE 1 CAPSULE BY MOUTH 3  TIMES DAILY     Neurology: Anticonvulsants - gabapentin Passed - 09/25/2020  8:50 AM      Passed - Valid encounter within last 12 months    Recent Outpatient Visits           5 months ago Pedal edema   [place], [place], [place]   8 months ago Essential hypertension   [place], Charlane Ferretti, MD   1 year ago Pain and swelling of right lower leg   [place] Fulp, [place], MD   1 year ago No-show for appointment   [place] Vaya, MD   1 year ago Hyperlipidemia, unspecified hyperlipidemia type   [place], [place], [place]

## 2020-09-25 NOTE — Telephone Encounter (Signed)
  Notes to clinic:  Overdue for office visit Message sent to patient to schedule office visit    Requested Prescriptions  Pending Prescriptions Disp Refills   meloxicam (MOBIC) 7.5 MG tablet [Pharmacy Med Name: Meloxicam 7.5 MG Oral Tablet] 30 tablet 11    Sig: TAKE 1 TABLET BY MOUTH  DAILY AS NEEDED FOR PAIN     Analgesics:  COX2 Inhibitors Passed - 09/25/2020  8:49 AM      Passed - HGB in normal range and within 360 days    Hemoglobin  Date Value Ref Range Status  06/26/2020 13.7 12.0 - 15.0 g/dL Final  06/15/2018 13.6 11.1 - 15.9 g/dL Final          Passed - Cr in normal range and within 360 days    Creat  Date Value Ref Range Status  02/12/2016 0.71 0.50 - 0.99 mg/dL Final    Comment:      For patients > or = 65 years of age: The upper reference limit for Creatinine is approximately 13% higher for people identified as African-American.      Creatinine, Ser  Date Value Ref Range Status  06/26/2020 0.76 0.44 - 1.00 mg/dL Final   Creatinine,U  Date Value Ref Range Status  06/28/2019 59.0 mg/dL Final          Passed - Patient is not pregnant      Passed - Valid encounter within last 12 months    Recent Outpatient Visits           5 months ago Pedal edema   [place], [place], [place]   8 months ago Essential hypertension   [place], Charlane Ferretti, MD   1 year ago Pain and swelling of right lower leg   [place], [place], MD   1 year ago No-show for appointment   [place], Deborah B, MD   1 year ago Hyperlipidemia, unspecified hyperlipidemia type   [place], [place], [place]

## 2020-10-06 DIAGNOSIS — R1310 Dysphagia, unspecified: Secondary | ICD-10-CM | POA: Diagnosis not present

## 2020-10-06 DIAGNOSIS — Z0000 Encounter for general adult medical examination without abnormal findings: Secondary | ICD-10-CM | POA: Diagnosis not present

## 2020-10-06 DIAGNOSIS — I10 Essential (primary) hypertension: Secondary | ICD-10-CM | POA: Diagnosis not present

## 2020-10-06 DIAGNOSIS — R600 Localized edema: Secondary | ICD-10-CM | POA: Diagnosis not present

## 2020-10-06 DIAGNOSIS — E785 Hyperlipidemia, unspecified: Secondary | ICD-10-CM | POA: Diagnosis not present

## 2020-10-06 DIAGNOSIS — G8929 Other chronic pain: Secondary | ICD-10-CM | POA: Diagnosis not present

## 2020-10-24 DIAGNOSIS — Z1152 Encounter for screening for COVID-19: Secondary | ICD-10-CM | POA: Diagnosis not present

## 2020-11-01 ENCOUNTER — Ambulatory Visit: Payer: Medicare Other | Admitting: Physician Assistant

## 2021-01-23 ENCOUNTER — Telehealth: Payer: Self-pay

## 2021-01-23 NOTE — Telephone Encounter (Signed)
Patient has called the office to inquire about getting an appointment with Dr.Duncan. States her legs ar very swollen and she is looking for a primary care, [facility] recommended [facility].  Please advise

## 2021-01-24 NOTE — Telephone Encounter (Signed)
Called pat and lvmtcb

## 2021-01-24 NOTE — Telephone Encounter (Signed)
I would like to help out but I don't have availability and I am closed to new patients.  See if another provider is accepting.  If she has urgent sx, then needs eval asap, ie potentially at [facility].  Thanks.

## 2021-01-24 NOTE — Telephone Encounter (Signed)
Please see Damita Dunnings message

## 2021-04-02 ENCOUNTER — Encounter (HOSPITAL_COMMUNITY): Payer: Self-pay

## 2021-04-02 ENCOUNTER — Emergency Department (HOSPITAL_COMMUNITY): Payer: Medicare Other

## 2021-04-02 ENCOUNTER — Other Ambulatory Visit: Payer: Self-pay

## 2021-04-02 ENCOUNTER — Emergency Department (HOSPITAL_COMMUNITY)
Admission: EM | Admit: 2021-04-02 | Discharge: 2021-04-02 | Disposition: A | Discharge status: Home / Self Care | Payer: Medicare Other | Attending: Emergency Medicine | Admitting: Emergency Medicine

## 2021-04-02 DIAGNOSIS — R2243 Localized swelling, mass and lump, lower limb, bilateral: Secondary | ICD-10-CM | POA: Diagnosis not present

## 2021-04-02 DIAGNOSIS — I10 Essential (primary) hypertension: Secondary | ICD-10-CM | POA: Insufficient documentation

## 2021-04-02 DIAGNOSIS — R609 Edema, unspecified: Secondary | ICD-10-CM

## 2021-04-02 DIAGNOSIS — E119 Type 2 diabetes mellitus without complications: Secondary | ICD-10-CM | POA: Insufficient documentation

## 2021-04-02 DIAGNOSIS — Z79899 Other long term (current) drug therapy: Secondary | ICD-10-CM | POA: Diagnosis not present

## 2021-04-02 DIAGNOSIS — R600 Localized edema: Secondary | ICD-10-CM | POA: Diagnosis not present

## 2021-04-02 LAB — CBC WITH DIFFERENTIAL/PLATELET
Abs Immature Granulocytes: 0.01 K/uL (ref 0.00–0.07)
Basophils Absolute: 0 K/uL (ref 0.0–0.1)
Basophils Relative: 1 %
Eosinophils Absolute: 0.1 K/uL (ref 0.0–0.5)
Eosinophils Relative: 2 %
HCT: 43.1 % (ref 36.0–46.0)
Hemoglobin: 14.2 g/dL (ref 12.0–15.0)
Immature Granulocytes: 0 %
Lymphocytes Relative: 31 %
Lymphs Abs: 1.8 K/uL (ref 0.7–4.0)
MCH: 30.5 pg (ref 26.0–34.0)
MCHC: 32.9 g/dL (ref 30.0–36.0)
MCV: 92.5 fL (ref 80.0–100.0)
Monocytes Absolute: 0.5 K/uL (ref 0.1–1.0)
Monocytes Relative: 8 %
Neutro Abs: 3.4 K/uL (ref 1.7–7.7)
Neutrophils Relative %: 58 %
Platelets: 242 K/uL (ref 150–400)
RBC: 4.66 MIL/uL (ref 3.87–5.11)
RDW: 13.1 % (ref 11.5–15.5)
WBC: 5.7 K/uL (ref 4.0–10.5)
nRBC: 0 % (ref 0.0–0.2)

## 2021-04-02 LAB — COMPREHENSIVE METABOLIC PANEL
ALT: 16 U/L (ref 0–44)
AST: 18 U/L (ref 15–41)
Albumin: 3.9 g/dL (ref 3.5–5.0)
Alkaline Phosphatase: 63 U/L (ref 38–126)
Anion gap: 7 (ref 5–15)
BUN: 12 mg/dL (ref 8–23)
CO2: 25 mmol/L (ref 22–32)
Calcium: 9.5 mg/dL (ref 8.9–10.3)
Chloride: 106 mmol/L (ref 98–111)
Creatinine, Ser: 0.72 mg/dL (ref 0.44–1.00)
GFR, Estimated: >60 mL/min (ref >60)
Glucose, Bld: 86 mg/dL (ref 70–99)
Potassium: 3.1 mmol/L — ABNORMAL LOW (ref 3.5–5.1)
Sodium: 138 mmol/L (ref 135–145)
Total Bilirubin: 0.5 mg/dL (ref 0.3–1.2)
Total Protein: 7.4 g/dL (ref 6.5–8.1)

## 2021-04-02 MED ORDER — FUROSEMIDE 20 MG PO TABS: 20.0000 mg | ORAL_TABLET | Freq: Every day | ORAL | 0 refills | Status: DC

## 2021-04-02 MED ORDER — POTASSIUM CHLORIDE CRYS ER 20 MEQ PO TBCR: 20.0000 meq | EXTENDED_RELEASE_TABLET | Freq: Two times a day (BID) | ORAL | 0 refills | Status: DC

## 2021-04-02 NOTE — ED Triage Notes (Signed)
Patient c/o bilateral leg swelling x 6 months. Patient states that she saw a physician in [place] 6 months ago and she did not receive any "water pills." Patient states she has an appointment with a physician in [place] in April. Patient states the swelling is worse.

## 2021-04-02 NOTE — Discharge Instructions (Signed)
Take Lasix to help with your leg swelling.  While taking Lasix I recommend taking potassium.

## 2021-04-02 NOTE — ED Provider Notes (Signed)
[facility] DEPT Provider Note   CSN: [id number] Arrival date & time: 04/02/21  5643     History  Chief Complaint  Patient presents with   Leg Swelling    Lorraine Ryan is a 66 y.o. female.  Leg swelling for over a year.  Worse over the last few months.  Primary doctor previously in [place].  She now has new doctor but cannot be seen until next month.  She has been on fluid pills in the past to use as needed for leg swelling but not currently taking.  She denies any shortness of breath, abdominal pain, leg pain.  No chest pain fever.  The history is provided by the patient.  Illness Severity:  Mild Onset quality:  Gradual Duration:  24 months Timing:  Intermittent Progression:  Waxing and waning Chronicity:  Recurrent Relieved by:  [place] Worsened by:  [place] Associated symptoms: no abdominal pain, no chest pain, no congestion, no cough, no diarrhea, no ear pain, no fatigue, no headaches, no myalgias, no nausea, no rash, no rhinorrhea, no sore throat, no vomiting and no wheezing       Home Medications Prior to Admission medications   Medication Sig Start Date End Date Taking? Authorizing Provider  potassium chloride SA (KLOR-CON M) 20 MEQ tablet Take 1 tablet (20 mEq total) by mouth 2 (two) times daily for 10 days. 04/02/21 04/12/21 Yes Taran Hable, DO  DULoxetine (CYMBALTA) 60 MG capsule Take 1 capsule (60 mg total) by mouth daily. 03/16/20   Arfeen, Arlyce Harman, MD  furosemide (LASIX) 20 MG tablet Take 1 tablet (20 mg total) by mouth daily. For 5 days as needed for swelling 04/02/21   Brighid Koch, DO  gabapentin (NEURONTIN) 300 MG capsule Take 1 capsule (300 mg total) by mouth 3 (three) times daily. 01/10/20   Charlott Rakes, MD  lisinopril-hydrochlorothiazide (ZESTORETIC) 20-12.5 MG tablet Take 2 tablets by mouth daily. 01/10/20   Charlott Rakes, MD  meloxicam (MOBIC) 7.5 MG tablet Take 1 tablet (7.5 mg total) by mouth daily. Prn pain  03/30/20   Argentina Donovan, PA-C  metoprolol succinate (TOPROL XL) 25 MG 24 hr tablet Take 1 tablet (25 mg total) by mouth daily. 03/11/19   Argentina Donovan, PA-C  omeprazole (PRILOSEC) 40 MG capsule Take 1 capsule (40 mg total) by mouth daily. 01/10/20   Charlott Rakes, MD  rosuvastatin (CRESTOR) 40 MG tablet TAKE 1 TABLET BY MOUTH  DAILY 10/20/19   Denice Paradise A, NP  tiZANidine (ZANAFLEX) 4 MG tablet TAKE 1 TABLET BY MOUTH EVERY 6 HOURS AS NEEDED FOR MUSCLE SPASM(S) 03/24/19   Charlott Rakes, MD  traZODone (DESYREL) 100 MG tablet Take one tab at bed time. 03/16/20   Arfeen, Arlyce Harman, MD  ziprasidone (GEODON) 80 MG capsule Take 1 capsule (80 mg total) by mouth at bedtime. 03/16/20   Kathlee Nations, MD      Allergies    Codeine, Oxycodone, Prednisone, and Tramadol    Review of Systems   Review of Systems  Constitutional:  Negative for fatigue.  HENT:  Negative for congestion, ear pain, rhinorrhea and sore throat.   Respiratory:  Negative for cough and wheezing.   Cardiovascular:  Negative for chest pain.  Gastrointestinal:  Negative for abdominal pain, diarrhea, nausea and vomiting.  Musculoskeletal:  Negative for myalgias.  Skin:  Negative for rash.  Neurological:  Negative for headaches.   Physical Exam Updated Vital Signs BP (!) 157/94 (BP Location: Right Arm)  Pulse 88    Temp 98.4 F (36.9 C) (Oral)    Resp 18    Ht 5\' 6"  (1.676 m)    Wt 122.5 kg    SpO2 97%    BMI 43.58 kg/m  Physical Exam Vitals and nursing note reviewed.  Constitutional:      General: She is not in acute distress.    Appearance: She is well-developed.  HENT:     Head: Normocephalic and atraumatic.     Mouth/Throat:     Mouth: Mucous membranes are moist.  Eyes:     Conjunctiva/sclera: Conjunctivae normal.     Pupils: Pupils are equal, round, and reactive to light.  Cardiovascular:     Rate and Rhythm: Normal rate and regular rhythm.     Pulses: Normal pulses.     Heart sounds: Normal heart sounds.  No murmur heard. Pulmonary:     Effort: Pulmonary effort is normal. No respiratory distress.     Breath sounds: Normal breath sounds.  Abdominal:     Palpations: Abdomen is soft.     Tenderness: There is no abdominal tenderness.  Musculoskeletal:        General: No swelling.     Cervical back: Neck supple.     Right lower leg: Edema present.     Left lower leg: Edema present.     Comments: 1+ pitting edema bilaterally  Skin:    General: Skin is warm and dry.     Capillary Refill: Capillary refill takes less than 2 seconds.  Neurological:     General: No focal deficit present.     Mental Status: She is alert.  Psychiatric:        Mood and Affect: Mood normal.    ED Results / Procedures / Treatments   Labs (all labs ordered are listed, but only abnormal results are displayed) Labs Reviewed  COMPREHENSIVE METABOLIC PANEL - Abnormal; Notable for the following components:      Result Value   Potassium 3.1 (*)    All other components within normal limits  CBC WITH DIFFERENTIAL/PLATELET    EKG None  Radiology DG Chest Portable 1 View  Result Date: 04/02/2021 CLINICAL DATA:  Leg swelling, shortness of breath EXAM: PORTABLE CHEST 1 VIEW COMPARISON:  Chest radiograph 06/27/2020 FINDINGS: Heart is at the upper limits of normal for size, unchanged. The upper mediastinal contours are stable. There is no focal consolidation or pulmonary edema. There is no pleural effusion or pneumothorax. There is no acute osseous abnormality. IMPRESSION: Stable chest with no radiographic evidence of acute cardiopulmonary process. Electronically Signed   By: Valetta Mole M.D.   On: 04/02/2021 10:37    Procedures Procedures    Medications Ordered in ED Medications - No data to display  ED Course/ Medical Decision Making/ A&P                           Medical Decision Making Amount and/or Complexity of Data Reviewed Labs: ordered. Radiology: ordered.  Risk Prescription drug  management.   Sashay Felling is here with leg swelling.  Comorbidities include bipolar disorder, hypertension, diabetes.  Acute on chronic leg swelling.  Here because over the last several months she has noticed increased leg swelling.  Had been on Lasix as needed in the past but recently moved here and is to establish with primary doctor next month.  She denies any shortness of breath, chest pain, abdominal pain.  Differential diagnosis includes  venous stasis process, peripheral edema.  Less likely this is heart failure, chronic kidney disease or liver disease.  Will check CBC, CMP, chest x-ray to evaluate for these processes.  Likely will give her prescription for Lasix for short course and recommend compression socks.  She appears very comfortable.  Chart review showed that echocardiogram several years ago showed normal ejection fraction.  Per my review and interpretation of chest x-ray, there is no signs of volume overload.  No pneumonia.  Per my review and interpretation of her lab work there is no significant anemia leukocytosis or kidney injury or liver dysfunction.  Potassium is 3.1.  We will have her take Lasix as needed with potassium.  Discharged in good condition.  Recommend follow-up with primary care doctor.  Overall suspect venous stasis.  This chart was dictated using voice recognition software.  Despite best efforts to proofread,  errors can occur which can change the documentation meaning.         Final Clinical Impression(s) / ED Diagnoses Final diagnoses:  Peripheral edema    Rx / DC Orders ED Discharge Orders          Ordered    furosemide (LASIX) 20 MG tablet  Daily        04/02/21 1104    potassium chloride SA (KLOR-CON M) 20 MEQ tablet  2 times daily        04/02/21 1104              Devon, DO 04/02/21 1105

## 2021-04-09 ENCOUNTER — Other Ambulatory Visit: Payer: Self-pay

## 2021-04-09 ENCOUNTER — Emergency Department: Payer: Medicare Other

## 2021-04-09 ENCOUNTER — Encounter: Payer: Self-pay | Admitting: Emergency Medicine

## 2021-04-09 DIAGNOSIS — M7122 Synovial cyst of popliteal space [Baker], left knee: Secondary | ICD-10-CM | POA: Insufficient documentation

## 2021-04-09 DIAGNOSIS — I10 Essential (primary) hypertension: Secondary | ICD-10-CM | POA: Insufficient documentation

## 2021-04-09 DIAGNOSIS — E119 Type 2 diabetes mellitus without complications: Secondary | ICD-10-CM | POA: Diagnosis not present

## 2021-04-09 DIAGNOSIS — M7989 Other specified soft tissue disorders: Secondary | ICD-10-CM | POA: Diagnosis present

## 2021-04-09 DIAGNOSIS — R600 Localized edema: Secondary | ICD-10-CM | POA: Diagnosis not present

## 2021-04-09 DIAGNOSIS — F0390 Unspecified dementia without behavioral disturbance: Secondary | ICD-10-CM | POA: Insufficient documentation

## 2021-04-09 NOTE — ED Triage Notes (Addendum)
Pt to ED via POV with complaints of left leg being more swollen than the right. No known injury, she noticed it tonight after getting up from the dinner table. She is able to bear weight and ambulate without difficulty. Denies redness or warmth or SHOB ?

## 2021-04-10 ENCOUNTER — Emergency Department
Admission: EM | Admit: 2021-04-10 | Discharge: 2021-04-10 | Disposition: A | Discharge status: Home / Self Care | Payer: Medicare Other | Attending: Emergency Medicine | Admitting: Emergency Medicine

## 2021-04-10 DIAGNOSIS — R609 Edema, unspecified: Secondary | ICD-10-CM

## 2021-04-10 DIAGNOSIS — M7122 Synovial cyst of popliteal space [Baker], left knee: Secondary | ICD-10-CM

## 2021-04-10 NOTE — ED Provider Notes (Signed)
? ?[facility] ?Provider Note ? ? ? Event Date/Time  ? First MD Initiated Contact with Patient 04/10/21 0033   ?  (approximate) ? ? ?History  ? ?Leg Swelling ? ? ?HPI ? ?Lorraine Ryan is a 66 y.o. female with past medical history of bipolar disorder, diabetes, hypertension who presents with left lower extremity swelling.  Patient noticed this today.  She felt like she was a little bit unsteady on her feet due to pain in the leg and behind the knee.  She denies history of DVT/PE.  No shortness of breath.  Patient has bilateral peripheral edema.  Seen recently for this and had a CBC and CMP that were unremarkable.  No history of heart failure.  Patient also complains of chronic back and neck pain that is not new.  She is not taking anything for it.  She has a PCP appointment next month. ?  ? ?Past Medical History:  ?Diagnosis Date  ? Anxiety   ? Arthritis   ? Back pain   ? Bipolar disorder ([place])   ? Chronic headaches   ? Dementia ([place])   ? Depression   ? Diabetes mellitus without complication ([place])   ? GERD (gastroesophageal reflux disease)   ? Hypertension   ? No meds prescribed; states intermittent  ? Neuromuscular disorder ([place])   ? Schizoaffective psychosis ([place]) 06/27/2020  ? Shortness of breath dyspnea   ? Stroke [facility])   ? caused numbness in leg  ? ? ?Patient Active Problem List  ? Diagnosis Date Noted  ? Bipolar disorder with psychotic features ([place]) 06/27/2020  ? Schizoaffective psychosis ([place]) 06/27/2020  ? Dysuria 06/28/2019  ? Acute pain of right shoulder 03/03/2018  ? Muscle spasm 03/03/2018  ? Frequent PVCs 12/29/2017  ? History of fall 10/23/2017  ? Depression   ? Anxiety   ? Abdominal pain, epigastric 03/30/2015  ? Dysphagia 03/30/2015  ? History of lumbar laminectomy for spinal cord decompression 02/08/2015  ? Hyperlipidemia 04/20/2014  ? Chronic midline low back pain without sciatica 03/01/2013  ? Numbness in right leg 03/01/2013  ? Chest pain on exertion 03/01/2013  ?  Hypertension   ? Stroke [facility])   ? ? ? ?Physical Exam  ?Triage Vital Signs: ?ED Triage Vitals  ?Enc Vitals Group  ?   BP 04/09/21 2053 (!) 144/78  ?   Pulse Rate 04/09/21 2053 80  ?   Resp 04/09/21 2053 18  ?   Temp 04/09/21 2053 98.4 ?F (36.9 ?C)  ?   Temp Source 04/09/21 2053 Oral  ?   SpO2 04/09/21 2053 93 %  ?   Weight 04/09/21 2056 270 lb (122.5 kg)  ?   Height 04/09/21 2056 '5\' 6"'$  (1.676 m)  ?   Head Circumference --   ?   Peak Flow --   ?   Pain Score 04/09/21 2056 7  ?   Pain Loc --   ?   Pain Edu? --   ?   Excl. in [place]? --   ? ? ?Most recent vital signs: ?Vitals:  ? 04/09/21 2053  ?BP: (!) 144/78  ?Pulse: 80  ?Resp: 18  ?Temp: 98.4 ?F (36.9 ?C)  ?SpO2: 93%  ? ? ? ?General: Awake, no distress.  ?CV:  Good peripheral perfusion.  1+ peripheral edema bilaterally, 2+ DP pulses bilaterally ?Resp:  Normal effort.  ?Abd:  No distention.  ?Neuro:             Awake, Alert, Oriented  x 3  ?Other:  Bilateral peripheral edema, symmetric, no significant swelling of the knee, normal range of motion of the left knee joint ? ? ?ED Results / Procedures / Treatments  ?Labs ?(all labs ordered are listed, but only abnormal results are displayed) ?Labs Reviewed - No data to display ? ? ?EKG ? ? ? ? ?RADIOLOGY ?Reviewed the DVT ultrasound which is negative for DVT but is consistent with a left Baker's cyst ? ? ?PROCEDURES: ? ?Critical Care performed: No ? ? ? ?MEDICATIONS ORDERED IN ED: ?Medications - No data to display ? ? ?IMPRESSION / MDM / ASSESSMENT AND PLAN / ED COURSE  ?I reviewed the triage vital signs and the nursing notes. ?             ?               ? ?Differential diagnosis includes, but is not limited to, DVT, muscle strain, arthritis, venous stasis, CHF ? ?Patient is a 66 year old female presents with left lower extremity swelling.  She has history of chronic peripheral edema was seen for this recently with a normal CBC and CMP.  Advised to use compression and elevation which she has not been doing.  Over the last day  she has noticed that the left leg seem to be more swollen and she was having some difficulty ambulating on it.  Denies acute injury.  No history of DVT PE.  She is not short of breath.  Patient does not appear to be in any respiratory distress.  She does have symmetric edema, I really do not appreciate any asymmetry.  The knee is nontender with normal range of motion.  Ultrasound of the of left lower extremity does not show a acute DVT, peroneal veins with limited evaluation.  There is a Baker's cyst which I suspect could be partially responsible for her symptoms and decreased range of motion in that knee.  Discussed elevation and compression as well as primary care follow-up.  In terms of her back and neck pain these are not new and she has no red flag symptoms of acute numbness tingling weakness or urinary symptoms.  Advised NSAIDs. ? ?  ? ? ?FINAL CLINICAL IMPRESSION(S) / ED DIAGNOSES  ? ?Final diagnoses:  ?Peripheral edema  ?Synovial cyst of left popliteal space  ? ? ? ?Rx / DC Orders  ? ?ED Discharge Orders   ? ? None  ? ?  ? ? ? ?Note:  This document was prepared using Dragon voice recognition software and may include unintentional dictation errors. ?  ?Rada Hay, MD ?04/10/21 [id number] ? ?

## 2021-04-10 NOTE — Discharge Instructions (Signed)
Your ultrasound of the left leg did not show blood clot.  You do have a Baker's cyst which is a pocket of fluid behind the knee likely related to arthritis.  Please follow-up with your primary care provider.  For your lower extremity swelling please use compression stockings and elevate your legs when you are seated.  Please follow-up with your primary care provider. ?

## 2021-04-16 ENCOUNTER — Encounter (HOSPITAL_COMMUNITY): Payer: Self-pay | Admitting: Emergency Medicine

## 2021-04-16 ENCOUNTER — Other Ambulatory Visit: Payer: Self-pay

## 2021-04-16 ENCOUNTER — Emergency Department (HOSPITAL_COMMUNITY)
Admission: EM | Admit: 2021-04-16 | Discharge: 2021-04-17 | Disposition: A | Discharge status: Home / Self Care | Payer: Medicare Other | Attending: Emergency Medicine | Admitting: Emergency Medicine

## 2021-04-16 DIAGNOSIS — F0390 Unspecified dementia without behavioral disturbance: Secondary | ICD-10-CM | POA: Insufficient documentation

## 2021-04-16 DIAGNOSIS — E119 Type 2 diabetes mellitus without complications: Secondary | ICD-10-CM | POA: Insufficient documentation

## 2021-04-16 DIAGNOSIS — Z79899 Other long term (current) drug therapy: Secondary | ICD-10-CM | POA: Diagnosis not present

## 2021-04-16 DIAGNOSIS — M79604 Pain in right leg: Secondary | ICD-10-CM | POA: Diagnosis not present

## 2021-04-16 DIAGNOSIS — R600 Localized edema: Secondary | ICD-10-CM | POA: Diagnosis present

## 2021-04-16 DIAGNOSIS — I10 Essential (primary) hypertension: Secondary | ICD-10-CM | POA: Insufficient documentation

## 2021-04-16 LAB — CBC WITH DIFFERENTIAL/PLATELET
Abs Immature Granulocytes: 0.01 10*3/uL (ref 0.00–0.07)
Basophils Absolute: 0.1 10*3/uL (ref 0.0–0.1)
Basophils Relative: 1 %
Eosinophils Absolute: 0.1 10*3/uL (ref 0.0–0.5)
Eosinophils Relative: 2 %
HCT: 40.4 % (ref 36.0–46.0)
Hemoglobin: 13.3 g/dL (ref 12.0–15.0)
Immature Granulocytes: 0 %
Lymphocytes Relative: 35 %
Lymphs Abs: 2.5 10*3/uL (ref 0.7–4.0)
MCH: 30.6 pg (ref 26.0–34.0)
MCHC: 32.9 g/dL (ref 30.0–36.0)
MCV: 92.9 fL (ref 80.0–100.0)
Monocytes Absolute: 0.6 10*3/uL (ref 0.1–1.0)
Monocytes Relative: 9 %
Neutro Abs: 3.8 10*3/uL (ref 1.7–7.7)
Neutrophils Relative %: 53 %
Platelets: 236 10*3/uL (ref 150–400)
RBC: 4.35 MIL/uL (ref 3.87–5.11)
RDW: 12.7 % (ref 11.5–15.5)
WBC: 7.1 10*3/uL (ref 4.0–10.5)
nRBC: 0 % (ref 0.0–0.2)

## 2021-04-16 LAB — COMPREHENSIVE METABOLIC PANEL
ALT: 15 U/L (ref 0–44)
AST: 20 U/L (ref 15–41)
Albumin: 3.4 g/dL — ABNORMAL LOW (ref 3.5–5.0)
Alkaline Phosphatase: 80 U/L (ref 38–126)
Anion gap: 11 (ref 5–15)
BUN: 14 mg/dL (ref 8–23)
CO2: 25 mmol/L (ref 22–32)
Calcium: 9.2 mg/dL (ref 8.9–10.3)
Chloride: 102 mmol/L (ref 98–111)
Creatinine, Ser: 0.78 mg/dL (ref 0.44–1.00)
GFR, Estimated: >60 mL/min (ref >60)
Glucose, Bld: 115 mg/dL — ABNORMAL HIGH (ref 70–99)
Potassium: 4 mmol/L (ref 3.5–5.1)
Sodium: 138 mmol/L (ref 135–145)
Total Bilirubin: 0.4 mg/dL (ref 0.3–1.2)
Total Protein: 6.6 g/dL (ref 6.5–8.1)

## 2021-04-16 LAB — BRAIN NATRIURETIC PEPTIDE: B Natriuretic Peptide: 15.4 pg/mL (ref 0.0–100.0)

## 2021-04-16 MED ORDER — ENOXAPARIN SODIUM 150 MG/ML IJ SOSY
140.0000 mg | PREFILLED_SYRINGE | Freq: Once | INTRAMUSCULAR | Status: AC
  Administered 2021-04-17: 140 mg via SUBCUTANEOUS
  Filled 2021-04-16: qty 0.94

## 2021-04-16 MED ORDER — FUROSEMIDE 20 MG PO TABS: 40.0000 mg | ORAL_TABLET | Freq: Every day | ORAL | 0 refills | Status: DC

## 2021-04-16 NOTE — ED Provider Triage Note (Signed)
Emergency Medicine Provider Triage Evaluation Note ? ?Lorraine Ryan , a 66 y.o. female  was evaluated in triage.  Pt complains of ble edema ongoing for months, this is her third ed visit for same. No sob. ? ?Review of Systems  ?Positive: Ble edema ?Negative: sob ? ?Physical Exam  ?BP 124/87 (BP Location: Left Arm)   Pulse 91   Temp 98.4 ?F (36.9 ?C) (Oral)   Resp 17   Ht '5\' 6"'$  (1.676 m)   Wt (!) 140 kg   SpO2 97%   BMI 49.82 kg/m?  ?Gen:   Awake, no distress   ?Resp:  Normal effort  ?MSK:   Moves extremities without difficulty  ?Other:  2-3+ edema to the ble ? ?Medical Decision Making  ?Medically screening exam initiated at 8:23 PM.  Appropriate orders placed.  Jode Lippe was informed that the remainder of the evaluation will be completed by another provider, this initial triage assessment does not replace that evaluation, and the importance of remaining in the ED until their evaluation is complete. ? ? ?  ?Rodney Booze, PA-C ?04/16/21 2024 ? ?

## 2021-04-16 NOTE — ED Notes (Signed)
Pt has been in assigned room, please disregard comments/notes that the patient did not answer. Error in charting. ?

## 2021-04-16 NOTE — ED Provider Notes (Signed)
[facility] EMERGENCY DEPARTMENT Provider Note   CSN: [id number] Arrival date & time: 04/16/21  2006     History  Chief Complaint  Patient presents with   Lower Legs/Feet Edema/Swelling    Lorraine Ryan is a 66 y.o. female.  HPI     This is a 66 year old female who presents with concerns for leg pain and edema.  Patient has been seen and evaluated twice in the last several weeks for the same.  Patient reports 1 to 47-monthhistory of worsening leg pain and edema.  She is complaining mostly of right leg pain today.  She had an evaluation last week at an outside ED where she was complaining mostly of left leg pain.  At that time she had a DVT study of the left leg that was negative for DVT.  She did have a Baker's cyst.  She also has recently been on a trial of Lasix and she cannot tell me whether the edema got better or not.  Patient states that edema is worse at the end of the day.  She has pain in the right calf and right leg.  No shortness of breath.  No chest pain.  No history of DVT.  Home Medications Prior to Admission medications   Medication Sig Start Date End Date Taking? Authorizing Provider  furosemide (LASIX) 20 MG tablet Take 2 tablets (40 mg total) by mouth daily. 04/16/21  Yes Rohn Fritsch, CBarbette Hair MD  DULoxetine (CYMBALTA) 60 MG capsule Take 1 capsule (60 mg total) by mouth daily. 03/16/20   Arfeen, SArlyce Harman MD  furosemide (LASIX) 20 MG tablet Take 1 tablet (20 mg total) by mouth daily. For 5 days as needed for swelling 04/02/21   Curatolo, Adam, DO  gabapentin (NEURONTIN) 300 MG capsule Take 1 capsule (300 mg total) by mouth 3 (three) times daily. 01/10/20   NCharlott Rakes MD  lisinopril-hydrochlorothiazide (ZESTORETIC) 20-12.5 MG tablet Take 2 tablets by mouth daily. 01/10/20   NCharlott Rakes MD  meloxicam (MOBIC) 7.5 MG tablet Take 1 tablet (7.5 mg total) by mouth daily. Prn pain 03/30/20   MArgentina Donovan PA-C  metoprolol succinate (TOPROL XL) 25 MG  24 hr tablet Take 1 tablet (25 mg total) by mouth daily. 03/11/19   MArgentina Donovan PA-C  omeprazole (PRILOSEC) 40 MG capsule Take 1 capsule (40 mg total) by mouth daily. 01/10/20   NCharlott Rakes MD  potassium chloride SA (KLOR-CON M) 20 MEQ tablet Take 1 tablet (20 mEq total) by mouth 2 (two) times daily for 10 days. 04/02/21 04/12/21  Curatolo, Adam, DO  rosuvastatin (CRESTOR) 40 MG tablet TAKE 1 TABLET BY MOUTH  DAILY 10/20/19   MDenice ParadiseA, NP  tiZANidine (ZANAFLEX) 4 MG tablet TAKE 1 TABLET BY MOUTH EVERY 6 HOURS AS NEEDED FOR MUSCLE SPASM(S) 03/24/19   NCharlott Rakes MD  traZODone (DESYREL) 100 MG tablet Take one tab at bed time. 03/16/20   Arfeen, SArlyce Harman MD  ziprasidone (GEODON) 80 MG capsule Take 1 capsule (80 mg total) by mouth at bedtime. 03/16/20   AKathlee Nations MD      Allergies    Codeine, Oxycodone, Prednisone, and Tramadol    Review of Systems   Review of Systems  Constitutional:  Negative for fever.  Cardiovascular:  Positive for leg swelling.  All other systems reviewed and are negative.  Physical Exam Updated Vital Signs BP 124/87 (BP Location: Left Arm)    Pulse 91    Temp 98.4  F (36.9 C) (Oral)    Resp 17    Ht 1.676 m ('5\' 6"'$ )    Wt (!) 140 kg    SpO2 97%    BMI 49.82 kg/m  Physical Exam Vitals and nursing note reviewed.  Constitutional:      Appearance: She is well-developed. She is obese. She is not ill-appearing.  HENT:     Head: Normocephalic and atraumatic.     Mouth/Throat:     Mouth: Mucous membranes are moist.  Eyes:     Pupils: Pupils are equal, round, and reactive to light.  Cardiovascular:     Rate and Rhythm: Normal rate and regular rhythm.     Heart sounds: Normal heart sounds.  Pulmonary:     Effort: Pulmonary effort is normal. No respiratory distress.     Breath sounds: No wheezing.  Abdominal:     Palpations: Abdomen is soft.  Musculoskeletal:     Cervical back: Neck supple.     Right lower leg: Edema present.     Left lower leg:  Edema present.     Comments: 1+ pitting bilateral lower extremity edema, tenderness palpation right posterior calf, no overlying skin changes  Skin:    General: Skin is warm and dry.  Neurological:     Mental Status: She is alert and oriented to person, place, and time.  Psychiatric:        Mood and Affect: Mood normal.    ED Results / Procedures / Treatments   Labs (all labs ordered are listed, but only abnormal results are displayed) Labs Reviewed  COMPREHENSIVE METABOLIC PANEL - Abnormal; Notable for the following components:      Result Value   Glucose, Bld 115 (*)    Albumin 3.4 (*)    All other components within normal limits  CBC WITH DIFFERENTIAL/PLATELET  BRAIN NATRIURETIC PEPTIDE    EKG None  Radiology No results found.  Procedures Procedures    Medications Ordered in ED Medications  enoxaparin (LOVENOX) injection 140 mg (has no administration in time range)    ED Course/ Medical Decision Making/ A&P                           Medical Decision Making Risk Prescription drug management.   This patient presents to the ED for concern of leg swelling, this involves an extensive number of treatment options, and is a complaint that carries with it a high risk of complications and morbidity.  The differential diagnosis includes DVT, peripheral edema, diet and description, CHF  MDM:    Patient presents with ongoing leg swelling and pain.  She is nontoxic and vital signs are reassuring.  She has no additional complaints.  She has pitting edema bilaterally but tenderness of the right calf.  She recently had DVT study that was negative on the left.  She did not have imaging of the right.  Labs today again reviewed and are largely reassuring.  Normal BNP.  Normal metabolites.  She has no evidence of cellulitis.  She cannot tell me whether her trial of Lasix helped.  We will place her on another 3-day course of Lasix given her peripheral edema.  We will have her return  for DVT study tomorrow for the right lower extremity.  Patient was given 1 mg/kg of Lovenox in the meantime.  Patient stated understanding.  Have low suspicion at this time for PE or heart failure. (Labs, imaging)  Labs: I Ordered,  and personally interpreted labs.  The pertinent results include: CBC, BMP BNP  Imaging Studies ordered: I ordered imaging studies including none I independently visualized and interpreted imaging. I agree with the radiologist interpretation  Additional history obtained from chart review.  External records from outside source obtained and reviewed including outside ED visits  Critical Interventions: Lovenox  Consultations: I requested consultation with the none,  and discussed lab and imaging findings as well as pertinent plan - they recommend: None  Cardiac Monitoring: The patient was maintained on a cardiac monitor.  I personally viewed and interpreted the cardiac monitored which showed an underlying rhythm of: N/A  Reevaluation: After the interventions noted above, I reevaluated the patient and found that they have :stayed the same   Considered admission for: N/A  Social Determinants of Health: lives independently  Disposition: Discharge  Co morbidities that complicate the patient evaluation  Past Medical History:  Diagnosis Date   Anxiety    Arthritis    Back pain    Bipolar disorder ([place])    Chronic headaches    Dementia ([place])    Depression    Diabetes mellitus without complication ([place])    GERD (gastroesophageal reflux disease)    Hypertension    No meds prescribed; states intermittent   Neuromuscular disorder ([place])    Schizoaffective psychosis ([place]) 06/27/2020   Shortness of breath dyspnea    Stroke ([place])    caused numbness in leg     Medicines Meds ordered this encounter  Medications   enoxaparin (LOVENOX) injection 140 mg   furosemide (LASIX) 20 MG tablet    Sig: Take 2 tablets (40 mg total) by mouth daily.    Dispense:   6 tablet    Refill:  0    I have reviewed the patients home medicines and have made adjustments as needed  Problem List / ED Course: Problem List Items Addressed This Visit   None Visit Diagnoses     Lower extremity edema    -  Primary                   Final Clinical Impression(s) / ED Diagnoses Final diagnoses:  Lower extremity edema    Rx / DC Orders ED Discharge Orders          Ordered    furosemide (LASIX) 20 MG tablet  Daily        04/16/21 2337    [place]        04/16/21 2337              Merryl Hacker, MD 04/16/21 2341

## 2021-04-16 NOTE — Discharge Instructions (Signed)
You were seen today for lower extremity edema.  Take another course of Lasix.  Return later for DVT study of the right leg.  Recent DVT study of the left leg was reassuring.  It is important that you follow-up with primary physician for ongoing care.  Avoid high salt in your diet.  Keep legs elevated. ?

## 2021-04-16 NOTE — ED Notes (Signed)
Pt called x1 for room assignment with no response ?

## 2021-04-16 NOTE — ED Triage Notes (Signed)
Patient reports chronic bilateral lower legs/feet edema with swelling for several months , denies injury/ambulatory , no SOB or fever.  ?

## 2021-04-17 NOTE — ED Notes (Signed)
Pt A&OX4 ambulatory at d/c with independent steady gait, NAD. Pt verbalized understanding of d/c instructions, prescription and follow up care. Pt declined offer for wheelchair. ?

## 2021-04-27 ENCOUNTER — Other Ambulatory Visit: Payer: Self-pay | Admitting: Family Medicine

## 2021-04-27 DIAGNOSIS — Z1231 Encounter for screening mammogram for malignant neoplasm of breast: Secondary | ICD-10-CM

## 2021-05-16 ENCOUNTER — Ambulatory Visit: Payer: Medicare Other

## 2021-06-01 ENCOUNTER — Encounter: Payer: Self-pay | Admitting: Family

## 2021-06-01 ENCOUNTER — Other Ambulatory Visit: Payer: Self-pay | Admitting: Family

## 2021-06-01 ENCOUNTER — Ambulatory Visit (INDEPENDENT_AMBULATORY_CARE_PROVIDER_SITE_OTHER): Payer: Medicare Other | Admitting: Family

## 2021-06-01 VITALS — BP 128/78 | HR 79 | Temp 98.8°F | Resp 16 | Ht 66.0 in | Wt 285.6 lb

## 2021-06-01 DIAGNOSIS — I493 Ventricular premature depolarization: Secondary | ICD-10-CM

## 2021-06-01 DIAGNOSIS — F32A Depression, unspecified: Secondary | ICD-10-CM

## 2021-06-01 DIAGNOSIS — M7122 Synovial cyst of popliteal space [Baker], left knee: Secondary | ICD-10-CM

## 2021-06-01 DIAGNOSIS — I10 Essential (primary) hypertension: Secondary | ICD-10-CM

## 2021-06-01 DIAGNOSIS — M545 Low back pain, unspecified: Secondary | ICD-10-CM

## 2021-06-01 DIAGNOSIS — R600 Localized edema: Secondary | ICD-10-CM

## 2021-06-01 DIAGNOSIS — R1011 Right upper quadrant pain: Secondary | ICD-10-CM

## 2021-06-01 DIAGNOSIS — E876 Hypokalemia: Secondary | ICD-10-CM

## 2021-06-01 DIAGNOSIS — R1013 Epigastric pain: Secondary | ICD-10-CM

## 2021-06-01 DIAGNOSIS — K219 Gastro-esophageal reflux disease without esophagitis: Secondary | ICD-10-CM

## 2021-06-01 DIAGNOSIS — Z5900 Homelessness unspecified: Secondary | ICD-10-CM | POA: Insufficient documentation

## 2021-06-01 DIAGNOSIS — E785 Hyperlipidemia, unspecified: Secondary | ICD-10-CM | POA: Diagnosis not present

## 2021-06-01 DIAGNOSIS — Z1231 Encounter for screening mammogram for malignant neoplasm of breast: Secondary | ICD-10-CM

## 2021-06-01 DIAGNOSIS — Z23 Encounter for immunization: Secondary | ICD-10-CM | POA: Diagnosis not present

## 2021-06-01 DIAGNOSIS — F319 Bipolar disorder, unspecified: Secondary | ICD-10-CM | POA: Diagnosis not present

## 2021-06-01 DIAGNOSIS — G8929 Other chronic pain: Secondary | ICD-10-CM

## 2021-06-01 DIAGNOSIS — R739 Hyperglycemia, unspecified: Secondary | ICD-10-CM

## 2021-06-01 DIAGNOSIS — M5442 Lumbago with sciatica, left side: Secondary | ICD-10-CM

## 2021-06-01 DIAGNOSIS — R440 Auditory hallucinations: Secondary | ICD-10-CM

## 2021-06-01 DIAGNOSIS — Z803 Family history of malignant neoplasm of breast: Secondary | ICD-10-CM

## 2021-06-01 DIAGNOSIS — F250 Schizoaffective disorder, bipolar type: Secondary | ICD-10-CM

## 2021-06-01 DIAGNOSIS — Z780 Asymptomatic menopausal state: Secondary | ICD-10-CM

## 2021-06-01 DIAGNOSIS — I639 Cerebral infarction, unspecified: Secondary | ICD-10-CM

## 2021-06-01 DIAGNOSIS — M5441 Lumbago with sciatica, right side: Secondary | ICD-10-CM

## 2021-06-01 LAB — LIPID PANEL
Cholesterol: 240 mg/dL — ABNORMAL HIGH (ref 0–200)
HDL: 56 mg/dL (ref >39.00)
LDL Cholesterol: 163 mg/dL — ABNORMAL HIGH (ref 0–99)
NonHDL: 184.11
Total CHOL/HDL Ratio: 4
Triglycerides: 104 mg/dL (ref 0.0–149.0)
VLDL: 20.8 mg/dL (ref 0.0–40.0)

## 2021-06-01 LAB — COMPREHENSIVE METABOLIC PANEL
ALT: 14 U/L (ref 0–35)
AST: 20 U/L (ref 0–37)
Albumin: 4.1 g/dL (ref 3.5–5.2)
Alkaline Phosphatase: 68 U/L (ref 39–117)
BUN: 10 mg/dL (ref 6–23)
CO2: 29 mEq/L (ref 19–32)
Calcium: 9.6 mg/dL (ref 8.4–10.5)
Chloride: 101 mEq/L (ref 96–112)
Creatinine, Ser: 0.74 mg/dL (ref 0.40–1.20)
GFR: 84.44 mL/min (ref >60.00)
Glucose, Bld: 95 mg/dL (ref 70–99)
Potassium: 3 mEq/L — ABNORMAL LOW (ref 3.5–5.1)
Sodium: 139 mEq/L (ref 135–145)
Total Bilirubin: 1.2 mg/dL (ref 0.2–1.2)
Total Protein: 7.4 g/dL (ref 6.0–8.3)

## 2021-06-01 LAB — CBC WITH DIFFERENTIAL/PLATELET
Basophils Absolute: 0 10*3/uL (ref 0.0–0.1)
Basophils Relative: 0.8 % (ref 0.0–3.0)
Eosinophils Absolute: 0.1 10*3/uL (ref 0.0–0.7)
Eosinophils Relative: 1.4 % (ref 0.0–5.0)
HCT: 42.2 % (ref 36.0–46.0)
Hemoglobin: 13.7 g/dL (ref 12.0–15.0)
Lymphocytes Relative: 26.1 % (ref 12.0–46.0)
Lymphs Abs: 1.5 10*3/uL (ref 0.7–4.0)
MCHC: 32.6 g/dL (ref 30.0–36.0)
MCV: 92.2 fl (ref 78.0–100.0)
Monocytes Absolute: 0.5 10*3/uL (ref 0.1–1.0)
Monocytes Relative: 7.7 % (ref 3.0–12.0)
Neutro Abs: 3.7 10*3/uL (ref 1.4–7.7)
Neutrophils Relative %: 64 % (ref 43.0–77.0)
Platelets: 225 10*3/uL (ref 150.0–400.0)
RBC: 4.57 Mil/uL (ref 3.87–5.11)
RDW: 13.7 % (ref 11.5–15.5)
WBC: 5.8 10*3/uL (ref 4.0–10.5)

## 2021-06-01 LAB — MICROALBUMIN / CREATININE URINE RATIO
Creatinine,U: 71.6 mg/dL
Microalb Creat Ratio: 1 mg/g (ref 0.0–30.0)
Microalb, Ur: <0.7 mg/dL (ref 0.0–1.9)

## 2021-06-01 MED ORDER — LISINOPRIL 20 MG PO TABS: 20.0000 mg | ORAL_TABLET | Freq: Every day | ORAL | 0 refills | Status: DC

## 2021-06-01 MED ORDER — LISINOPRIL 20 MG PO TABS: 20.0000 mg | ORAL_TABLET | Freq: Every day | ORAL | 3 refills | Status: DC

## 2021-06-01 MED ORDER — OMEPRAZOLE 40 MG PO CPDR: 40.0000 mg | DELAYED_RELEASE_CAPSULE | Freq: Every day | ORAL | 0 refills | Status: DC

## 2021-06-01 MED ORDER — METOPROLOL SUCCINATE ER 25 MG PO TB24: 25.0000 mg | ORAL_TABLET | Freq: Every day | ORAL | 3 refills | Status: DC

## 2021-06-01 MED ORDER — ROSUVASTATIN CALCIUM 40 MG PO TABS: 40.0000 mg | ORAL_TABLET | Freq: Every day | ORAL | 1 refills | Status: DC

## 2021-06-01 MED ORDER — METOPROLOL SUCCINATE ER 25 MG PO TB24: 25.0000 mg | ORAL_TABLET | Freq: Every day | ORAL | 0 refills | Status: DC

## 2021-06-01 MED ORDER — OMEPRAZOLE 40 MG PO CPDR: 40.0000 mg | DELAYED_RELEASE_CAPSULE | Freq: Every day | ORAL | 1 refills | Status: DC

## 2021-06-01 MED ORDER — POTASSIUM CHLORIDE CRYS ER 10 MEQ PO TBCR
EXTENDED_RELEASE_TABLET | ORAL | 0 refills | Status: DC
Start: 2021-06-01 — End: 2021-06-05

## 2021-06-01 MED ORDER — GABAPENTIN 300 MG PO CAPS: 300.0000 mg | ORAL_CAPSULE | Freq: Three times a day (TID) | ORAL | 1 refills | Status: DC

## 2021-06-01 MED ORDER — FUROSEMIDE 20 MG PO TABS: 20.0000 mg | ORAL_TABLET | Freq: Every day | ORAL | 0 refills | Status: DC

## 2021-06-01 NOTE — Assessment & Plan Note (Signed)
Recommend with racing heart that we restart metoprolol, stable on auscultation. Pt complies.  ?Sent in rx refill for toprol XL 25 mg  ?

## 2021-06-01 NOTE — Assessment & Plan Note (Signed)
Currently living out of her car.  ?Hoping to get more money from disability to buy a home.  ?Has sons nearby but unclear their relationship, states she sees them 'from time to time' ?Refuses social work referral states they do not help her.  ? ?

## 2021-06-01 NOTE — Assessment & Plan Note (Signed)
Screening mammogram ordered

## 2021-06-01 NOTE — Progress Notes (Signed)
? ?Established Patient Office Visit ? ?Subjective:  ?Patient ID: Lorraine Ryan, female    DOB: Mar 04, 1955  Age: 66 y.o. MRN: [id number] ? ?CC:  ?Chief Complaint  ?Patient presents with  ? Establish Care  ? ? ?HPI ?Lorraine Ryan is here for a transition of care visit. ? ?Prior provider was: Lorraine Paradise, NP  ?Pt is without acute concerns.  ? ?Pt is currently homeless, been living in her car for three months. Takes showers at the gym when needed. She is on disability, and trying to get some increase money.  ? ?Bipolar depression with anxiety with schizophrenia: not currently on medications for this at current: often hears voices, and talks back with them at times. No thoughts of hurting herself or anything. Can hear about four or five different voices at a time. Does want a different psychiatrist.  ?     She was taking haldol decanoate, neurontin (is still taking this for her back pain) and cymbalta in the past.  ?When questioned if she wants a refill she states 'i gave up my meds for now, will talk to the psychiatrist:  ? ?Left lower leg edema, and right leg edema. The left lower leg is tender/painful. Did go to ER back in march, march 6th with u/s venous doppler left leg with bakers cysts left posterior knee and no Dvt. Still tender especially when walking.  ? ?Colonoscopy 2017, due again in ten years.  ? ?chronic concerns: ? ?Chronic midline low back pain with sciatica: takes gabapentin taking 300 mg three times daily.  ? ?PVC's -ongoing- she stopped her metoprolol because she ran out. Does feel as though she has racing heart beat, states that she sits still to make it rest. Denies chest pain, no sob ? ?Constipation at times. Doesn't really watch fiber on her diet.  ? ?Past Medical History:  ?Diagnosis Date  ? Anxiety   ? Arthritis   ? Back pain   ? Bipolar disorder ([place])   ? Chronic headaches   ? Dementia ([place])   ? Depression   ? Diabetes mellitus without complication ([place])   ? GERD (gastroesophageal reflux  disease)   ? Hypertension   ? No meds prescribed; states intermittent  ? Neuromuscular disorder ([place])   ? Schizoaffective psychosis ([place]) 06/27/2020  ? Shortness of breath dyspnea   ? Stroke [facility]   ? caused numbness in leg  ? ? ?Past Surgical History:  ?Procedure Laterality Date  ? ABDOMINAL HYSTERECTOMY    ? no cervix  ? COLONOSCOPY WITH PROPOFOL N/A 04/18/2015  ? LFY:BOFBPZWC hemorrhoids/diverticulosis sigmoid colon/  ? ESOPHAGOGASTRODUODENOSCOPY (EGD) WITH PROPOFOL N/A 04/18/2015  ? SLF:web in the proximal esophagus/dilated/  ? GANGLION CYST EXCISION    ? LUMBAR LAMINECTOMY/DECOMPRESSION MICRODISCECTOMY N/A 02/08/2015  ? Procedure: L4-5 Decompression;  Surgeon: Marybelle Killings, MD;  Location: [place];  Service: Orthopedics;  Laterality: N/A;  ? POLYPECTOMY  04/18/2015  ? Procedure: POLYPECTOMY;  Surgeon: Danie Binder, MD;  [facility];  Service: Endoscopy;;  ascending colon polyp  ? TONSILLECTOMY    ? ? ?Family History  ?Problem Relation Age of Onset  ? Diabetes Mother   ? Hypertension Mother   ? Heart disease Other   ?     No family history  ? Breast cancer Sister   ?     diagnosed in her 83's  ? Anxiety disorder Sister   ? Depression Sister   ? Anxiety disorder Brother   ? Depression Brother   ?  Anxiety disorder Brother   ? Depression Brother   ? Cancer Maternal Grandmother   ?     unknown type  ? Colon cancer Neg Hx   ? ? ?Social History  ? ?Socioeconomic History  ? Marital status: Single  ?  Spouse name: Not on file  ? Number of children: 5  ? Years of education: Not on file  ? Highest education level: Associate degree: occupational, Hotel manager, or vocational program  ?Occupational History  ? Not on file  ?Tobacco Use  ? Smoking status: Former  ?  Packs/day: 0.50  ?  Years: 10.00  ?  Pack years: 5.00  ?  Types: Cigarettes  ?  Quit date: 03/29/1985  ?  Years since quitting: 36.2  ? Smokeless tobacco: Never  ?Vaping Use  ? Vaping Use: Never used  ?Substance and Sexual Activity  ? Alcohol use: No  ?   Alcohol/week: 0.0 standard drinks  ?  Comment: Used to drink heavily, no ETOH in "30-some years"  ? Drug use: No  ?  Comment: History of crack, "no drugs in 30-some years"  ? Sexual activity: Not Currently  ?Other Topics Concern  ? Not on file  ?Social History Narrative  ? She is no longer living with [name]- her sone >1.5 years ago. [name] got married yest and pans to go into the [employer]. She no longer has a place to live.   ? She is living in her truck and plans to go to [place] to be with other family   ? She has other children in   ?   ? Exercise: gets in the pool  and showers there  ? Diet: eats a lot of fast food  ? ?Social Determinants of Health  ? ?Financial Resource Strain: Not on file  ?Food Insecurity: Not on file  ?Transportation Needs: Not on file  ?Physical Activity: Not on file  ?Stress: Not on file  ?Social Connections: Not on file  ?Intimate Partner Violence: Not on file  ? ? ?Outpatient Medications Prior to Visit  ?Medication Sig Dispense Refill  ? lisinopril-hydrochlorothiazide (ZESTORETIC) 20-12.5 MG tablet Take 2 tablets by mouth daily. 180 tablet 1  ? DULoxetine (CYMBALTA) 60 MG capsule Take 1 capsule (60 mg total) by mouth daily. 90 capsule 0  ? gabapentin (NEURONTIN) 300 MG capsule Take 1 capsule (300 mg total) by mouth 3 (three) times daily. 270 capsule 1  ? meloxicam (MOBIC) 7.5 MG tablet Take 1 tablet (7.5 mg total) by mouth daily. Prn pain 30 tablet 3  ? rosuvastatin (CRESTOR) 40 MG tablet TAKE 1 TABLET BY MOUTH  DAILY 90 tablet 2  ? traZODone (DESYREL) 100 MG tablet Take one tab at bed time. 90 tablet 0  ? ziprasidone (GEODON) 80 MG capsule Take 1 capsule (80 mg total) by mouth at bedtime. 90 capsule 0  ? furosemide (LASIX) 20 MG tablet Take 1 tablet (20 mg total) by mouth daily. For 5 days as needed for swelling (Patient not taking: Reported on 06/01/2021) 30 tablet 0  ? furosemide (LASIX) 20 MG tablet Take 2 tablets (40 mg total) by mouth daily. (Patient not taking: Reported on  06/01/2021) 6 tablet 0  ? metoprolol succinate (TOPROL XL) 25 MG 24 hr tablet Take 1 tablet (25 mg total) by mouth daily. 90 tablet 3  ? omeprazole (PRILOSEC) 40 MG capsule Take 1 capsule (40 mg total) by mouth daily. 90 capsule 1  ? potassium chloride SA (KLOR-CON M) 20 MEQ tablet Take 1  tablet (20 mEq total) by mouth 2 (two) times daily for 10 days. 20 tablet 0  ? tiZANidine (ZANAFLEX) 4 MG tablet TAKE 1 TABLET BY MOUTH EVERY 6 HOURS AS NEEDED FOR MUSCLE SPASM(S) 60 tablet 0  ? ?No facility-administered medications prior to visit.  ? ? ?Allergies  ?Allergen Reactions  ? Codeine Other (See Comments)  ?  Headache  ? Oxycodone Nausea Only and Palpitations  ? Prednisone Other (See Comments)  ?  Headache  ? Tramadol Nausea Only  ? ? ?ROS ?Review of Systems  ?Constitutional:  Negative for chills, fatigue, fever and unexpected weight change.  ?Eyes:  Negative for visual disturbance.  ?Respiratory:  Negative for shortness of breath.   ?Cardiovascular:  Negative for chest pain.  ?Gastrointestinal:  Positive for abdominal pain (epigastric tenderness at times) and constipation (at times). Negative for blood in stool, diarrhea, nausea and vomiting.  ?     No flatulence  ?Denies heart burn ?  ?Genitourinary:  Negative for difficulty urinating.  ?Musculoskeletal:  Positive for arthralgias (bil lower leg pain, left greater than right. left posterior knee pain with walking) and back pain (chronic low back pain with bil sciatica).  ?Skin:  Negative for rash.  ?Neurological:  Negative for dizziness and headaches.  ?Psychiatric/Behavioral:  Positive for agitation (speaking with voices however friendly and pleasant in responses to me), behavioral problems and hallucinations (auditory, speaking to voices while in office, states responding to 4-5 voices, all friendly). Negative for confusion, self-injury, sleep disturbance (pt states sleeping) and suicidal ideas. The patient is not nervous/anxious.   ? ? ?  ?Objective:  ?  ?Physical  Exam ?Vitals reviewed.  ?Constitutional:   ?   General: She is not in acute distress. ?   Appearance: Normal appearance. She is obese. She is not ill-appearing or toxic-appearing.  ?HENT:  ?   Right Ear: Tympanic membr

## 2021-06-01 NOTE — Assessment & Plan Note (Signed)
Pt active with this during office visit, listening to and responding to auditory hallucinations.  ?She declines that they are saying anything harmful, states she has no plan to hurt or kill herself.  ?Advised her if voices say otherwise or she has SI HI go to er and or seek help immediately.  ?

## 2021-06-01 NOTE — Assessment & Plan Note (Signed)
Mammogram ordered. Pending results. 

## 2021-06-01 NOTE — Assessment & Plan Note (Signed)
Pt to continue on lisinopril /hctz 20/12.5 mcg once daily ?Pt advised of the following:  ?Continue medication as prescribed. Monitor blood pressure periodically and/or when you feel symptomatic. Goal is <130/90 on average. Ensure that you have rested for 30 minutes prior to checking your blood pressure. Record your readings and bring them to your next visit if necessary.work on a low sodium diet. ? ?

## 2021-06-01 NOTE — Assessment & Plan Note (Signed)
Ordered lipid panel, pending results. Work on low cholesterol diet and exercise as tolerated ?Pt refuses for now to restart crestor, will await lab results. Was on 40 mg crestor in the past. ?

## 2021-06-01 NOTE — Assessment & Plan Note (Signed)
bil furosemide 20 mg refill sent however told her to pend results of potassium until use.  ?Elevate legs prn ?

## 2021-06-01 NOTE — Assessment & Plan Note (Signed)
Pt in current psychotic episode in terms of hearing auditory hallucinations and responding back to them. Currently living out of her car, taking showers at the gym. I wanted to place social work referral, she refuses stating they can't help her. She is trying to increase her money from disability and plans to buy a home. Referral was placed for psychiatry for re-eval to her consent. ?

## 2021-06-01 NOTE — Assessment & Plan Note (Addendum)
Refill gabapentin 300 mg po tid sent to pharmacy  ? ?Total time in office speaking with pt in regards to history, setting up referrals, reviewing old notes from behavioral health, cardiologist, and old mammogram reports as well as ordering labs and going over acute and chronic concerns totaled 50 minutes . ?

## 2021-06-01 NOTE — Progress Notes (Signed)
Potassium low.  ?Sending in RX for potassium 10 meq to take one pill twice daily x one week and one tablet once daily for one week. Have pt schedule one week lab only appt to repeat potassium in one week.  ? ?Also, HCTZ is likely causing low potassium. Going to d/c HCTZ and start pt on lisinopril 20 mg once daily only. Sent in new RX.

## 2021-06-01 NOTE — Assessment & Plan Note (Signed)
ppsv23 vaccine administered in office ?Pt tolerated procedure well  ?Verbal consent obtained prior to administration  ?Handout given in regards to vaccination.  ? ?Advised pt due for shingles vaccination and will need to obtain this at the pharmacy for coverage with insurance. ? ? ? ?

## 2021-06-01 NOTE — Assessment & Plan Note (Signed)
Referral to psychiatrist. Also advised pt there is behavioral health walk in clinic.  ?

## 2021-06-01 NOTE — Assessment & Plan Note (Signed)
a1c ordered. Pending results.  ?Work on diabetic diet exercise as tolerated ?

## 2021-06-01 NOTE — Assessment & Plan Note (Signed)
Ordering bone density order. ?

## 2021-06-01 NOTE — Assessment & Plan Note (Signed)
Reviewed recent u/s venous doppler test with left bakers cyst.  ?Pt with ongoing left lower leg pain, No DVT.  ?Referral placed for orthopedists. Rest/ice/elevate. ?

## 2021-06-01 NOTE — Patient Instructions (Addendum)
A referral was placed today for orthopedist for bakers cyst.  ?Please let us know if you have not heard back within 2 weeks about the referral. ? ?Stop by the lab prior to leaving today. I will notify you of your results once received.  ? ?Welcome to our clinic, I am happy to have you as my new patient. I am excited to continue on this healthcare journey with you. ? ?Stop by the lab prior to leaving today. I will notify you of your results once received.  ? ?I also ordered an ultrasound of your gallbladder to be done at [place] [facility]. Please call [phone] to schedule.  ? ?Call [facility] to schedule your mammogram as well as bone density scan as I have sent the electronic order to their facility.  ?Here is their number : [phone]  ? ?Please keep in mind ?Any my chart messages you send have p to a three business day turnaround for a response.  ?Phone calls may have up to a one day business turnaround for a  response.  ? ?If you need a medication refill I recommend you request it through the pharmacy as this is easiest for [place] rather than sending a message and or phone call.  ? ?Due to recent changes in healthcare laws, you may see results of your imaging and/or laboratory studies on MyChart before I have had a chance to review them.  I understand that in some cases there may be results that are confusing or concerning to you. Please understand that not all results are received at the same time and often I may need to interpret multiple results in order to provide you with the best plan of care or course of treatment. Therefore, I ask that you please give me 2 business days to thoroughly review all your results before contacting my office for clarification. Should we see a critical lab result, you will be contacted sooner.  ? ?It was a pleasure seeing you today! Please do not hesitate to reach out with any questions and or concerns. ? ?Regards,  ? ?Sae Handrich ?FNP-C ? ?For the behavioral health center we spoke about:  ? ?walk in at the [facility] location on [address] in [place] ? ?Find Help 24/7 ?By Phone ? ?Call our 24-hour HelpLine at [phone] or [phone] for immediate assistance for mental health and substance abuse issues. ? ?Walk-In Help ?[place]: [facility] (Ages 62 and Up) ? ? ? ? ?

## 2021-06-02 LAB — URINE CULTURE
MICRO NUMBER:: 13326591
SPECIMEN QUALITY:: ADEQUATE

## 2021-06-03 ENCOUNTER — Other Ambulatory Visit: Payer: Self-pay | Admitting: Family

## 2021-06-03 DIAGNOSIS — N3000 Acute cystitis without hematuria: Secondary | ICD-10-CM

## 2021-06-03 MED ORDER — AMOXICILLIN-POT CLAVULANATE 875-125 MG PO TABS: 1.0000 | ORAL_TABLET | Freq: Two times a day (BID) | ORAL | 0 refills | Status: DC

## 2021-06-03 MED ORDER — AMOXICILLIN-POT CLAVULANATE 875-125 MG PO TABS: 1.0000 | ORAL_TABLET | Freq: Two times a day (BID) | ORAL | 0 refills | Status: AC

## 2021-06-08 ENCOUNTER — Ambulatory Visit (INDEPENDENT_AMBULATORY_CARE_PROVIDER_SITE_OTHER): Payer: Medicare Other | Admitting: Family

## 2021-06-08 ENCOUNTER — Encounter: Payer: Self-pay | Admitting: Family

## 2021-06-08 ENCOUNTER — Ambulatory Visit (INDEPENDENT_AMBULATORY_CARE_PROVIDER_SITE_OTHER)
Admission: RE | Admit: 2021-06-08 | Discharge: 2021-06-08 | Disposition: A | Discharge status: Home / Self Care | Payer: Medicare Other | Source: Ambulatory Visit | Attending: Family | Admitting: Family

## 2021-06-08 VITALS — BP 102/60 | HR 82 | Temp 98.7°F | Resp 16 | Ht 66.0 in | Wt 286.4 lb

## 2021-06-08 DIAGNOSIS — G8929 Other chronic pain: Secondary | ICD-10-CM

## 2021-06-08 DIAGNOSIS — M5442 Lumbago with sciatica, left side: Secondary | ICD-10-CM | POA: Diagnosis not present

## 2021-06-08 DIAGNOSIS — M5441 Lumbago with sciatica, right side: Secondary | ICD-10-CM | POA: Insufficient documentation

## 2021-06-08 DIAGNOSIS — N3000 Acute cystitis without hematuria: Secondary | ICD-10-CM | POA: Insufficient documentation

## 2021-06-08 DIAGNOSIS — M545 Low back pain, unspecified: Secondary | ICD-10-CM | POA: Diagnosis not present

## 2021-06-08 NOTE — Progress Notes (Signed)
? ?Established Patient Office Visit ? ?Subjective:  ?Patient ID: Lorraine Ryan, female    DOB: 1955/03/17  Age: 66 y.o. MRN: [id number] ? ?CC:  ?Chief Complaint  ?Patient presents with  ? Leg Pain  ?  Burning X few months.  ? ? ?HPI ?Lorraine Ryan is here today with concerns.  ? ?Bil thigh pain, nerve like sensation, burning numbness tingling.  ?Not necessarily worse with walking.  ?Has tried vaseline with no real relief.  ? ?UTI: just picked up augmentin yesterday for a UTI.  ? ?Past Medical History:  ?Diagnosis Date  ? Anxiety   ? Arthritis   ? Back pain   ? Bipolar disorder ([place])   ? Chronic headaches   ? Dementia ([place])   ? Depression   ? Diabetes mellitus without complication ([place])   ? GERD (gastroesophageal reflux disease)   ? Hypertension   ? No meds prescribed; states intermittent  ? Neuromuscular disorder ([place])   ? Schizoaffective psychosis ([place]) 06/27/2020  ? Shortness of breath dyspnea   ? Stroke [facility])   ? caused numbness in leg  ? ? ?Past Surgical History:  ?Procedure Laterality Date  ? ABDOMINAL HYSTERECTOMY    ? no cervix  ? COLONOSCOPY WITH PROPOFOL N/A 04/18/2015  ? NFA:[id number] hemorrhoids/diverticulosis sigmoid colon/  ? ESOPHAGOGASTRODUODENOSCOPY (EGD) WITH PROPOFOL N/A 04/18/2015  ? SLF:web in the proximal esophagus/dilated/  ? GANGLION CYST EXCISION    ? LUMBAR LAMINECTOMY/DECOMPRESSION MICRODISCECTOMY N/A 02/08/2015  ? Procedure: L4-5 Decompression;  Surgeon: Marybelle Killings, MD;  Location: [place];  Service: Orthopedics;  Laterality: N/A;  ? POLYPECTOMY  04/18/2015  ? Procedure: POLYPECTOMY;  Surgeon: Danie Binder, MD;  [facility];  Service: Endoscopy;;  ascending colon polyp  ? TONSILLECTOMY    ? ? ?Family History  ?Problem Relation Age of Onset  ? Diabetes Mother   ? Hypertension Mother   ? Heart disease Other   ?     No family history  ? Breast cancer Sister   ?     diagnosed in her 23's  ? Anxiety disorder Sister   ? Depression Sister   ? Anxiety disorder Brother   ? Depression  Brother   ? Anxiety disorder Brother   ? Depression Brother   ? Cancer Maternal Grandmother   ?     unknown type  ? Colon cancer Neg Hx   ? ? ?Social History  ? ?Socioeconomic History  ? Marital status: Single  ?  Spouse name: Not on file  ? Number of children: 5  ? Years of education: Not on file  ? Highest education level: Associate degree: occupational, Hotel manager, or vocational program  ?Occupational History  ? Not on file  ?Tobacco Use  ? Smoking status: Former  ?  Packs/day: 0.50  ?  Years: 10.00  ?  Pack years: 5.00  ?  Types: Cigarettes  ?  Quit date: 03/29/1985  ?  Years since quitting: 36.2  ? Smokeless tobacco: Never  ?Vaping Use  ? Vaping Use: Never used  ?Substance and Sexual Activity  ? Alcohol use: No  ?  Alcohol/week: 0.0 standard drinks  ?  Comment: Used to drink heavily, no ETOH in "30-some years"  ? Drug use: No  ?  Comment: History of crack, "no drugs in 30-some years"  ? Sexual activity: Not Currently  ?Other Topics Concern  ? Not on file  ?Social History Narrative  ? She is no longer living with [name]- her sone >1.5  years ago. [name] got married yest and pans to go into the [employer]. She no longer has a place to live.   ? She is living in her truck and plans to go to [place] to be with other family   ? She has other children in   ?   ? Exercise: gets in the pool  and showers there  ? Diet: eats a lot of fast food  ? ?Social Determinants of Health  ? ?Financial Resource Strain: Not on file  ?Food Insecurity: Not on file  ?Transportation Needs: Not on file  ?Physical Activity: Not on file  ?Stress: Not on file  ?Social Connections: Not on file  ?Intimate Partner Violence: Not on file  ? ? ?Outpatient Medications Prior to Visit  ?Medication Sig Dispense Refill  ? amoxicillin-clavulanate (AUGMENTIN) 875-125 MG tablet Take 1 tablet by mouth 2 (two) times daily for 7 days. 14 tablet 0  ? furosemide (LASIX) 20 MG tablet Take 1 tablet (20 mg total) by mouth daily. For 5 days as needed for swelling 30  tablet 0  ? gabapentin (NEURONTIN) 300 MG capsule Take 300 mg by mouth See admin instructions. Take one po qam, one po at lunch time, and two tablets qhs    ? lisinopril (ZESTRIL) 20 MG tablet Take 1 tablet (20 mg total) by mouth daily. 90 tablet 3  ? metoprolol succinate (TOPROL XL) 25 MG 24 hr tablet Take 1 tablet (25 mg total) by mouth daily. 30 tablet 0  ? omeprazole (PRILOSEC) 40 MG capsule Take 1 capsule (40 mg total) by mouth daily. 30 capsule 0  ? potassium chloride (KLOR-CON M10) 10 MEQ tablet TAKE ONE TABLET TWICE DAILY FOR ONE WEEK THEN TAKE ONE TABLET ONCE DAILY FOR ONE MORE WEEK 21 tablet 0  ? gabapentin (NEURONTIN) 300 MG capsule Take 1 capsule (300 mg total) by mouth 3 (three) times daily. 270 capsule 1  ? ?No facility-administered medications prior to visit.  ? ? ?Allergies  ?Allergen Reactions  ? Codeine Other (See Comments)  ?  Headache  ? Oxycodone Nausea Only and Palpitations  ? Prednisone Other (See Comments)  ?  Headache  ? Tramadol Nausea Only  ? ? ?ROS ?Review of Systems  ?Constitutional:  Negative for chills and fatigue.  ?Respiratory:  Negative for cough and shortness of breath.   ?Cardiovascular:  Negative for chest pain and leg swelling.  ?Gastrointestinal:  Negative for diarrhea and nausea.  ?Genitourinary:  Negative for decreased urine volume, difficulty urinating, dysuria, frequency, menstrual problem, pelvic pain, urgency and vaginal bleeding.  ?Musculoskeletal:  Positive for arthralgias (tingling/numbness/burning at times with bil anterior thighs) and back pain (chronic low back pain with bil sciatica).  ?Neurological:  Negative for dizziness and headaches.  ?Psychiatric/Behavioral:  Negative for agitation and sleep disturbance.   ?All other systems reviewed and are negative. ? ?  ?Objective:  ?  ?Physical Exam ?Vitals reviewed.  ?Constitutional:   ?   General: She is not in acute distress. ?   Appearance: Normal appearance. She is obese. She is not ill-appearing, toxic-appearing or  diaphoretic.  ?HENT:  ?   Mouth/Throat:  ?   Pharynx: No pharyngeal swelling.  ?   Tonsils: No tonsillar exudate.  ?Neck:  ?   Thyroid: No thyroid mass.  ?Pulmonary:  ?   Effort: Pulmonary effort is normal.  ?Abdominal:  ?   Tenderness: There is no abdominal tenderness.  ?Musculoskeletal:  ?   Lumbar back: Tenderness (midline lower back with some  tenderness) present. Decreased range of motion (painful ROM with hyperextension flexion). Positive right straight leg raise test and positive left straight leg raise test.  ?Lymphadenopathy:  ?   Cervical:  ?   Right cervical: No superficial cervical adenopathy. ?   Left cervical: No superficial cervical adenopathy.  ?Skin: ?   General: Skin is warm.  ?   Capillary Refill: Capillary refill takes less than 2 seconds.  ?Neurological:  ?   General: No focal deficit present.  ?   Mental Status: She is alert and oriented to person, place, and time.  ?Psychiatric:     ?   Mood and Affect: Mood normal.     ?   Behavior: Behavior normal.     ?   Thought Content: Thought content normal.     ?   Judgment: Judgment normal.  ? ? ?BP 102/60   Pulse 82   Temp 98.7 ?F (37.1 ?C)   Resp 16   Ht '5\' 6"'$  (1.676 m)   Wt 286 lb 6 oz (129.9 kg)   SpO2 97%   BMI 46.22 kg/m?  ?Wt Readings from Last 3 Encounters:  ?06/08/21 286 lb 6 oz (129.9 kg)  ?06/01/21 285 lb 9 oz (129.5 kg)  ?04/16/21 (!) 308 lb 10.3 oz (140 kg)  ? ? ? ?Health Maintenance Due  ?Topic Date Due  ? Zoster Vaccines- Shingrix (1 of 2) Never done  ? COVID-19 Vaccine (2 - Janssen risk series) 05/21/2019  ? DEXA SCAN  Never done  ? MAMMOGRAM  08/10/2020  ? ? ?There are no preventive care reminders to display for this patient. ? ?Lab Results  ?Component Value Date  ? TSH 1.59 06/28/2019  ? ?Lab Results  ?Component Value Date  ? WBC 5.8 06/01/2021  ? HGB 13.7 06/01/2021  ? HCT 42.2 06/01/2021  ? MCV 92.2 06/01/2021  ? PLT 225.0 06/01/2021  ? ?Lab Results  ?Component Value Date  ? NA 139 06/01/2021  ? K 3.0 (L) 06/01/2021  ? CO2 29  06/01/2021  ? GLUCOSE 95 06/01/2021  ? BUN 10 06/01/2021  ? CREATININE 0.74 06/01/2021  ? BILITOT 1.2 06/01/2021  ? ALKPHOS 68 06/01/2021  ? AST 20 06/01/2021  ? ALT 14 06/01/2021  ? PROT 7.4 06/01/2021  ? ALBUM

## 2021-06-08 NOTE — Assessment & Plan Note (Signed)
Take augmentin 875/125 mg po bid x 10 days  ?Increase oral fluids ?

## 2021-06-08 NOTE — Patient Instructions (Addendum)
Start taking potassium daily as prescribed.  ?Recommend starting b12 1000 mcg once daily over the counter (nature made) ?Complete xray(s) prior to leaving today. I will notify you of your results once received. ?For gabapentin, take one in the am, one at lunch time, and two tablets at night time.  ? ?Please refer to this list, and call around to see which place may be best suited for you.  ?Let me know which one you would like to go with, and if needed I will place a referral for you.  ?Please remember to ask them if they take your insurance. ?Another option is to call your insurance and inquire about available covered psychiatrists, and make appointment with one covered.  ? ?[facility] located at [address] [place], [place], [place].  [phone].    ?  ?[facility] located at [address] [place], [place]. [phone].  ?  ?[facility] located at [address], [place], [place].  [phone].  ?  ?[facility] located at [place], [place], [place].  [phone].  ?  ?[facility] located at [address], [place], [place], [place].  [phone].    ?  ?[facility] located at [place]. [address], [place], [place], [place].  [phone].    ?  ?[facility] 4 local practices located at:  ?  ?[address] [place] [address], [place], [place].  [phone].  ?  ?[place], [place], [place].  ?  ?[address] [place], [place], [place].  [phone].  ?  ?[address] [place], [place], [place]. [phone].  ?  ?[facility] located at [address], [place], [place], [place]. [phone].    ?  ?[facility] located at [address], [place], [place] [address]- [phone]?  ?  ?[facility] located at [address], [place], [place] [address] [phone]?  ?  ?[facility]  located [place], [place], [place] [address] = [phone]?  ?  ?[facility] located at [address] [place], [place] [address] = [phone]?  ?  ?[facility] [place], [place], [place] [address] - [phone]  ? ?

## 2021-06-08 NOTE — Assessment & Plan Note (Signed)
Tylenol prn  ?Increase gabapentin, take 300 mg po qam, 300 midday and 600 mg qhs to see if improvement ?Start otc 1000 mcg b12 once daily ?Work on sciatica exercises sent to [facility] ?Low back xray to see if herniation ?Also treating UTI to see if improvement ?

## 2021-06-08 NOTE — Progress Notes (Signed)
Lorraine Ryan, I am sorry  ?We did discuss potassium during the visit, but I did not tell her to d/c hctz and or start lisinopril.  ?If you could please just reiterate that with her.

## 2021-06-11 ENCOUNTER — Encounter: Payer: Self-pay | Admitting: *Deleted

## 2021-06-14 ENCOUNTER — Ambulatory Visit: Payer: Medicare Other

## 2021-06-20 ENCOUNTER — Telehealth: Payer: Self-pay

## 2021-06-20 NOTE — Telephone Encounter (Signed)
Left message to return call to our office. Needs an appointment to see Tabitha. ?

## 2021-06-20 NOTE — Telephone Encounter (Signed)
Noted. Agree with in-office evaluation. 

## 2021-06-20 NOTE — Telephone Encounter (Signed)
I spoke with pt; pt said the back of rt foot is swollen and hurts; pt said both lower legs(below knees) are swollen, red and feel like they are burning and painful; pt said now pain level is 10. Pt said she is still able to walk.No CP or SOB. Pt said she is not in distress and does not want to go to UC or ED. Pt scheduled appt with T Dugal FNP on 06/21/21 at 11 AM. UC & ED precautions given and pt voiced understanding. Sending note to Red Christians FNP who is out of office this afternoon, Jannette Spanner NP who is in office this afternoon and [facility] CMA. ?

## 2021-06-20 NOTE — Telephone Encounter (Signed)
Left message to return call to our office. Called to see if Pt wanted an appointment to be seen by [place]. ?

## 2021-06-20 NOTE — Telephone Encounter (Signed)
Agree with precautions given to pt  Agree with nurse assessment in plan.  Thank you for speaking with them. 

## 2021-06-21 ENCOUNTER — Ambulatory Visit
Admission: RE | Admit: 2021-06-21 | Discharge: 2021-06-21 | Disposition: A | Discharge status: Home / Self Care | Payer: Medicare Other | Source: Ambulatory Visit | Attending: Family | Admitting: Family

## 2021-06-21 ENCOUNTER — Ambulatory Visit (INDEPENDENT_AMBULATORY_CARE_PROVIDER_SITE_OTHER)
Admission: RE | Admit: 2021-06-21 | Discharge: 2021-06-21 | Disposition: A | Discharge status: Home / Self Care | Payer: Medicare Other | Source: Ambulatory Visit | Attending: Family | Admitting: Family

## 2021-06-21 ENCOUNTER — Encounter: Payer: Self-pay | Admitting: Family

## 2021-06-21 ENCOUNTER — Ambulatory Visit (INDEPENDENT_AMBULATORY_CARE_PROVIDER_SITE_OTHER): Payer: Medicare Other | Admitting: Family

## 2021-06-21 VITALS — BP 124/76 | HR 71 | Temp 98.0°F | Resp 16 | Ht 66.0 in | Wt 291.6 lb

## 2021-06-21 DIAGNOSIS — M79604 Pain in right leg: Secondary | ICD-10-CM

## 2021-06-21 DIAGNOSIS — R062 Wheezing: Secondary | ICD-10-CM | POA: Diagnosis not present

## 2021-06-21 DIAGNOSIS — M7989 Other specified soft tissue disorders: Secondary | ICD-10-CM | POA: Insufficient documentation

## 2021-06-21 DIAGNOSIS — E876 Hypokalemia: Secondary | ICD-10-CM

## 2021-06-21 DIAGNOSIS — R350 Frequency of micturition: Secondary | ICD-10-CM | POA: Insufficient documentation

## 2021-06-21 DIAGNOSIS — R635 Abnormal weight gain: Secondary | ICD-10-CM | POA: Diagnosis not present

## 2021-06-21 LAB — BASIC METABOLIC PANEL
BUN: 11 mg/dL (ref 6–23)
CO2: 29 mEq/L (ref 19–32)
Calcium: 9.7 mg/dL (ref 8.4–10.5)
Chloride: 103 mEq/L (ref 96–112)
Creatinine, Ser: 0.81 mg/dL (ref 0.40–1.20)
GFR: 75.73 mL/min (ref >60.00)
Glucose, Bld: 89 mg/dL (ref 70–99)
Potassium: 3.5 mEq/L (ref 3.5–5.1)
Sodium: 141 mEq/L (ref 135–145)

## 2021-06-21 LAB — POC URINALSYSI DIPSTICK (AUTOMATED)
Bilirubin, UA: NEGATIVE
Glucose, UA: NEGATIVE
Ketones, UA: NEGATIVE
Leukocytes, UA: NEGATIVE
Nitrite, UA: NEGATIVE
Protein, UA: NEGATIVE
Spec Grav, UA: 1.01 (ref 1.010–1.025)
Urobilinogen, UA: 0.2 E.U./dL
pH, UA: 6.5 (ref 5.0–8.0)

## 2021-06-21 LAB — BRAIN NATRIURETIC PEPTIDE: Pro B Natriuretic peptide (BNP): 10 pg/mL (ref 0.0–100.0)

## 2021-06-21 LAB — TSH: TSH: 1.54 u[IU]/mL (ref 0.35–5.50)

## 2021-06-21 MED ORDER — FUROSEMIDE 40 MG PO TABS: 40.0000 mg | ORAL_TABLET | Freq: Every day | ORAL | 3 refills | Status: DC

## 2021-06-21 MED ORDER — POTASSIUM CHLORIDE CRYS ER 20 MEQ PO TBCR: 20.0000 meq | EXTENDED_RELEASE_TABLET | Freq: Two times a day (BID) | ORAL | 0 refills | Status: DC

## 2021-06-21 NOTE — Progress Notes (Signed)
Chest xray with no acute findings. No fluid in lungs. Mild enlargement of heart but not overly concerning we will continue to monitor this.

## 2021-06-21 NOTE — Patient Instructions (Addendum)
Increase furosemide to 40 mg once daily for the next one week  Then decrease to 20 mg once daily.   Start taking potassium 20 meq twice a day for the next two weeks.   Follow up with me in one week as well.    Venous Doppler - [facility] - ARRIVE by 12:40pm [place], [place], [place] [address]     [place] - [facility] - [place]  [place], [address], [place] at 9:40am   **NPO (nothing to eat or drink) after midnight (12am) before the appt. Can take meds with sip of water.      Due to recent changes in healthcare laws, you may see results of your imaging and/or laboratory studies on MyChart before I have had a chance to review them.  I understand that in some cases there may be results that are confusing or concerning to you. Please understand that not all results are received at the same time and often I may need to interpret multiple results in order to provide you with the best plan of care or course of treatment. Therefore, I ask that you please give me 2 business days to thoroughly review all your results before contacting my office for clarification. Should we see a critical lab result, you will be contacted sooner.   It was a pleasure seeing you today! Please do not hesitate to reach out with any questions and or concerns.  Regards,   Eugenia Pancoast FNP-C

## 2021-06-21 NOTE — Assessment & Plan Note (Signed)
R/o CHF Order BNP bmp and tsh  Ordering chest xray today stat

## 2021-06-21 NOTE — Assessment & Plan Note (Signed)
Increase furosemide to 40 mg daily x one week increase kcl 20 meq twice daily

## 2021-06-21 NOTE — Assessment & Plan Note (Signed)
CXR today.  

## 2021-06-21 NOTE — Assessment & Plan Note (Signed)
Repeat bmp today Potassium low, increase potassium to 20 meq twice daily

## 2021-06-21 NOTE — Assessment & Plan Note (Addendum)
poct urine dip in office Urine culture ordered pending results Uti one month ago, completed augmentin x 10 days

## 2021-06-21 NOTE — Assessment & Plan Note (Signed)
Stat venous doppler u/s right to r/o DVT Increase furosemide to 40 mg x one week Increase potassium to 20 meq twice daily x one week F/u one week Monitor weight daily for additional gain. If sudden onset sob or doe go to er and or call 59.  Elevate legs Compression stockings once dvt test negative

## 2021-06-21 NOTE — Progress Notes (Signed)
Established Patient Office Visit  Subjective:  Patient ID: Masae Lukacs, female    DOB: Feb 07, 1955  Age: 66 y.o. MRN: [id number]  CC:  Chief Complaint  Patient presents with  . Belepharitis    Both legs X pretty good while    HPI Tazia Illescas is here today with concerns.   Pt had UTI positive 4/28, states she thinks she completed her augmentin in its entirety.   Urinary frequency, urinary urgency, no dysuria. Does have some right back flank pain. No blood seen in urine.   Right lower leg pain with swelling and pain, five pounds weight gain in the last two weeks. No doe or sob. Leg with some swelling, not as much not painful. No rash to leg. Some fatigue.  Wt Readings from Last 3 Encounters:  06/21/21 291 lb 9 oz (132.3 kg)  06/08/21 286 lb 6 oz (129.9 kg)  06/01/21 285 lb 9 oz (129.5 kg)     Past Medical History:  Diagnosis Date  . Anxiety   . Arthritis   . Back pain   . Bipolar disorder ([place])   . Chronic headaches   . Dementia ([place])   . Depression   . Diabetes mellitus without complication ([place])   . GERD (gastroesophageal reflux disease)   . Hypertension    No meds prescribed; states intermittent  . Neuromuscular disorder ([place])   . Schizoaffective psychosis ([place]) 06/27/2020  . Shortness of breath dyspnea   . Stroke [facility])    caused numbness in leg    Past Surgical History:  Procedure Laterality Date  . ABDOMINAL HYSTERECTOMY     no cervix  . COLONOSCOPY WITH PROPOFOL N/A 04/18/2015   WEX:[id number] hemorrhoids/diverticulosis sigmoid colon/  . ESOPHAGOGASTRODUODENOSCOPY (EGD) WITH PROPOFOL N/A 04/18/2015   SLF:web in the proximal esophagus/dilated/  . GANGLION CYST EXCISION    . LUMBAR LAMINECTOMY/DECOMPRESSION MICRODISCECTOMY N/A 02/08/2015   Procedure: L4-5 Decompression;  Surgeon: Marybelle Killings, MD;  Location: [facility];  Service: Orthopedics;  Laterality: N/A;  . POLYPECTOMY  04/18/2015   Procedure: POLYPECTOMY;  Surgeon: Danie Binder, MD;  [facility];  Service: Endoscopy;;  ascending colon polyp  . TONSILLECTOMY      Family History  Problem Relation Age of Onset  . Diabetes Mother   . Hypertension Mother   . Heart disease Other        No family history  . Breast cancer Sister        diagnosed in her 26's  . Anxiety disorder Sister   . Depression Sister   . Anxiety disorder Brother   . Depression Brother   . Anxiety disorder Brother   . Depression Brother   . Cancer Maternal Grandmother        unknown type  . Colon cancer Neg Hx     Social History   Socioeconomic History  . Marital status: Single    Spouse name: Not on file  . Number of children: 5  . Years of education: Not on file  . Highest education level: Associate degree: occupational, Hotel manager, or vocational program  Occupational History  . Not on file  Tobacco Use  . Smoking status: Former    Packs/day: 0.50    Years: 10.00    Pack years: 5.00    Types: Cigarettes    Quit date: 03/29/1985    Years since quitting: 36.2  . Smokeless tobacco: Never  Vaping Use  . Vaping Use: Never used  Substance and Sexual Activity  .  Alcohol use: No    Alcohol/week: 0.0 standard drinks    Comment: Used to drink heavily, no ETOH in "30-some years"  . Drug use: No    Comment: History of crack, "no drugs in 30-some years"  . Sexual activity: Not Currently  Other Topics Concern  . Not on file  Social History Narrative   She is no longer living with [name]- her sone >1.5 years ago. [name] got married yest and pans to go into the [employer]. She no longer has a place to live.    She is living in her truck and plans to go to [place] to be with other family    She has other children in       Exercise: gets in the pool  and showers there   Diet: eats a lot of fast food   Social Determinants of Health   Financial Resource Strain: Not on file  Food Insecurity: Not on file  Transportation Needs: Not on file  Physical Activity: Not on file  Stress: Not on file   Social Connections: Not on file  Intimate Partner Violence: Not on file    Outpatient Medications Prior to Visit  Medication Sig Dispense Refill  . gabapentin (NEURONTIN) 300 MG capsule Take 300 mg by mouth See admin instructions. Take one po qam, one po at lunch time, and two tablets qhs    . lisinopril (ZESTRIL) 20 MG tablet Take 1 tablet (20 mg total) by mouth daily. 90 tablet 3  . metoprolol succinate (TOPROL XL) 25 MG 24 hr tablet Take 1 tablet (25 mg total) by mouth daily. 30 tablet 0  . omeprazole (PRILOSEC) 40 MG capsule Take 1 capsule (40 mg total) by mouth daily. 30 capsule 0  . furosemide (LASIX) 20 MG tablet Take 1 tablet (20 mg total) by mouth daily. For 5 days as needed for swelling 30 tablet 0  . potassium chloride (KLOR-CON M10) 10 MEQ tablet TAKE ONE TABLET TWICE DAILY FOR ONE WEEK THEN TAKE ONE TABLET ONCE DAILY FOR ONE MORE WEEK 21 tablet 0   No facility-administered medications prior to visit.    Allergies  Allergen Reactions  . Codeine Other (See Comments)    Headache  . Oxycodone Nausea Only and Palpitations  . Prednisone Other (See Comments)    Headache  . Tramadol Nausea Only    ROS Review of Systems  Constitutional:  Positive for unexpected weight change (gain of five pounds in one week). Negative for chills, fatigue and fever.  Respiratory:  Negative for cough and shortness of breath.   Cardiovascular:  Positive for leg swelling (bil right > left). Negative for chest pain.  Gastrointestinal:  Negative for diarrhea and nausea.  Genitourinary:  Positive for flank pain, frequency (right) and urgency. Negative for difficulty urinating, dysuria, menstrual problem and vaginal bleeding.  Musculoskeletal:  Positive for arthralgias (right lower leg pain with swelling).  Neurological:  Negative for dizziness and headaches.  Psychiatric/Behavioral:  Negative for agitation and sleep disturbance.   All other systems reviewed and are negative.    Objective:     Physical Exam Vitals reviewed.  Constitutional:      General: She is not in acute distress.    Appearance: Normal appearance. She is not ill-appearing or toxic-appearing.  HENT:     Right Ear: Tympanic membrane normal.     Left Ear: Tympanic membrane normal.     Mouth/Throat:     Mouth: Mucous membranes are moist.     Pharynx:  No pharyngeal swelling.     Tonsils: No tonsillar exudate.  Eyes:     Extraocular Movements: Extraocular movements intact.     Conjunctiva/sclera: Conjunctivae normal.     Pupils: Pupils are equal, round, and reactive to light.  Neck:     Thyroid: No thyroid mass.  Cardiovascular:     Rate and Rhythm: Normal rate and regular rhythm.     Pulses:          Carotid pulses are 2+ on the right side and 2+ on the left side.      Radial pulses are 2+ on the right side and 2+ on the left side.       Femoral pulses are 2+ on the right side and 2+ on the left side.      Popliteal pulses are 2+ on the right side and 2+ on the left side.       Dorsalis pedis pulses are 2+ on the right side and 2+ on the left side.       Posterior tibial pulses are 2+ on the right side and 2+ on the left side.     Comments: Negative homans sign right foot but tenderness on palpation   Pulmonary:     Effort: Pulmonary effort is normal.     Breath sounds: Examination of the right-lower field reveals decreased breath sounds. Decreased breath sounds present.  Musculoskeletal:     Right lower leg: 3+ Pitting Edema present.     Left lower leg: 2+ Edema present.     Comments: Bil FROM with ankle and bil le  Lymphadenopathy:     Cervical:     Right cervical: No superficial cervical adenopathy.    Left cervical: No superficial cervical adenopathy.  Skin:    General: Skin is warm.     Capillary Refill: Capillary refill takes less than 2 seconds.  Neurological:     General: No focal deficit present.     Mental Status: She is alert and oriented to person, place, and time.  Psychiatric:         Mood and Affect: Mood normal.        Behavior: Behavior normal.        Thought Content: Thought content normal.        Judgment: Judgment normal.    BP 124/76   Pulse 71   Temp 98 F (36.7 C)   Resp 16   Ht '5\' 6"'$  (1.676 m)   Wt 291 lb 9 oz (132.3 kg)   SpO2 95%   BMI 47.06 kg/m  Wt Readings from Last 3 Encounters:  06/21/21 291 lb 9 oz (132.3 kg)  06/08/21 286 lb 6 oz (129.9 kg)  06/01/21 285 lb 9 oz (129.5 kg)     Health Maintenance Due  Topic Date Due  . Zoster Vaccines- Shingrix (1 of 2) Never done  . COVID-19 Vaccine (2 - Booster for Janssen series) 06/18/2019  . DEXA SCAN  Never done  . MAMMOGRAM  08/10/2020    There are no preventive care reminders to display for this patient.  Lab Results  Component Value Date   TSH 1.59 06/28/2019   Lab Results  Component Value Date   WBC 5.8 06/01/2021   HGB 13.7 06/01/2021   HCT 42.2 06/01/2021   MCV 92.2 06/01/2021   PLT 225.0 06/01/2021   Lab Results  Component Value Date   NA 139 06/01/2021   K 3.0 (L) 06/01/2021   CO2 29 06/01/2021  GLUCOSE 95 06/01/2021   BUN 10 06/01/2021   CREATININE 0.74 06/01/2021   BILITOT 1.2 06/01/2021   ALKPHOS 68 06/01/2021   AST 20 06/01/2021   ALT 14 06/01/2021   PROT 7.4 06/01/2021   ALBUMIN 4.1 06/01/2021   CALCIUM 9.6 06/01/2021   ANIONGAP 11 04/16/2021   GFR 84.44 06/01/2021   Lab Results  Component Value Date   HGBA1C 5.6 06/28/2019      Assessment & Plan:   Problem List Items Addressed This Visit       Other   Hypokalemia    Repeat bmp today Potassium low, increase potassium to 20 meq twice daily        Relevant Medications   potassium chloride SA (KLOR-CON M) 20 MEQ tablet   Abnormal weight gain    R/o CHF Order BNP bmp and tsh  Ordering chest xray today stat       Relevant Medications   potassium chloride SA (KLOR-CON M) 20 MEQ tablet   Other Relevant Orders   Brain natriuretic peptide   Basic metabolic panel   TSH   Expiratory  wheezing on right side of chest    CXR today        Relevant Orders   DG Chest 2 View   Right leg swelling    Increase furosemide to 40 mg daily x one week increase kcl 20 meq twice daily        Relevant Medications   potassium chloride SA (KLOR-CON M) 20 MEQ tablet   Right leg pain - Primary    Stat venous doppler u/s right to r/o DVT Increase furosemide to 40 mg x one week Increase potassium to 20 meq twice daily x one week F/u one week Monitor weight daily for additional gain. If sudden onset sob or doe go to er and or call 57.  Elevate legs Compression stockings once dvt test negative       Relevant Orders   US Venous Img Lower Unilateral Right (DVT)   Urinary frequency    poct urine dip in office Urine culture ordered pending results Uti one month ago, completed augmentin x 10 days      Relevant Orders   Urine Culture   POCT Urinalysis Dipstick (Automated)    Meds ordered this encounter  Medications  . potassium chloride SA (KLOR-CON M) 20 MEQ tablet    Sig: Take 1 tablet (20 mEq total) by mouth 2 (two) times daily for 14 days. For low potassium.    Dispense:  28 tablet    Refill:  0    Order Specific Question:   Supervising Provider    Answer:   BEDSOLE, AMY E [[id number]]  . furosemide (LASIX) 40 MG tablet    Sig: Take 1 tablet (40 mg total) by mouth daily.    Dispense:  30 tablet    Refill:  3    Order Specific Question:   Supervising Provider    Answer:   BEDSOLE, AMY E [[id number]]    Follow-up: No follow-ups on file.    Eugenia Pancoast, FNP

## 2021-06-22 ENCOUNTER — Ambulatory Visit
Admission: RE | Admit: 2021-06-22 | Discharge: 2021-06-22 | Disposition: A | Discharge status: Home / Self Care | Payer: Medicare Other | Source: Ambulatory Visit | Attending: Family | Admitting: Family

## 2021-06-22 DIAGNOSIS — R1011 Right upper quadrant pain: Secondary | ICD-10-CM

## 2021-06-22 DIAGNOSIS — R1013 Epigastric pain: Secondary | ICD-10-CM

## 2021-06-22 LAB — URINE CULTURE
MICRO NUMBER:: 13414659
Result:: NO GROWTH
SPECIMEN QUALITY:: ADEQUATE

## 2021-06-22 NOTE — Progress Notes (Signed)
No congestive heart failure (negative BNP) Potassium is at a better level continue as prescribed.  Kidney function stable and looks really good Thyroid stable.

## 2021-06-23 ENCOUNTER — Other Ambulatory Visit: Payer: Self-pay | Admitting: Family

## 2021-06-23 DIAGNOSIS — K219 Gastro-esophageal reflux disease without esophagitis: Secondary | ICD-10-CM

## 2021-06-25 ENCOUNTER — Other Ambulatory Visit: Payer: Self-pay | Admitting: Family

## 2021-06-25 DIAGNOSIS — K760 Fatty (change of) liver, not elsewhere classified: Secondary | ICD-10-CM | POA: Insufficient documentation

## 2021-06-25 DIAGNOSIS — D135 Benign neoplasm of extrahepatic bile ducts: Secondary | ICD-10-CM | POA: Insufficient documentation

## 2021-06-25 DIAGNOSIS — I10 Essential (primary) hypertension: Secondary | ICD-10-CM

## 2021-06-25 NOTE — Progress Notes (Signed)
Incidentally found on ultrasound:   Adenomyomatosis of gallbladder. Typically this is without symptoms. This is caused by overgrowth of gallbladder wall with mucosa. May cause your epigastric pain.   Fatty liver: work on diet and exercise.  I have referred her to GI for eval/treat

## 2021-06-28 ENCOUNTER — Ambulatory Visit: Payer: Medicare Other | Admitting: Family

## 2021-07-03 ENCOUNTER — Other Ambulatory Visit: Payer: Self-pay | Admitting: Family

## 2021-07-03 ENCOUNTER — Ambulatory Visit (INDEPENDENT_AMBULATORY_CARE_PROVIDER_SITE_OTHER): Payer: Medicare Other | Admitting: Family

## 2021-07-03 ENCOUNTER — Encounter: Payer: Self-pay | Admitting: Family

## 2021-07-03 VITALS — BP 110/64 | HR 94 | Temp 98.5°F | Resp 16 | Ht 66.0 in | Wt 292.2 lb

## 2021-07-03 DIAGNOSIS — R600 Localized edema: Secondary | ICD-10-CM | POA: Diagnosis not present

## 2021-07-03 DIAGNOSIS — R7303 Prediabetes: Secondary | ICD-10-CM

## 2021-07-03 DIAGNOSIS — R0609 Other forms of dyspnea: Secondary | ICD-10-CM

## 2021-07-03 DIAGNOSIS — M79671 Pain in right foot: Secondary | ICD-10-CM

## 2021-07-03 DIAGNOSIS — F250 Schizoaffective disorder, bipolar type: Secondary | ICD-10-CM

## 2021-07-03 DIAGNOSIS — E876 Hypokalemia: Secondary | ICD-10-CM

## 2021-07-03 LAB — POCT GLYCOSYLATED HEMOGLOBIN (HGB A1C): Hemoglobin A1C: 5.7 % — AB (ref 4.0–5.6)

## 2021-07-03 NOTE — Patient Instructions (Addendum)
A referral was placed today for podiatry Please let us know if you have not heard back within 2 weeks about the referral.  Your imaging for echocardiogram (ultrasound of the heart) Has been scheduled at the following location:  [facility] [address], [place] [address] Phone: [phone],  8-5 pm   Please wait for a call prior to scheduling appointment from our referral team.  Due to recent changes in healthcare laws, you may see results of your imaging and/or laboratory studies on MyChart before I have had a chance to review them.  I understand that in some cases there may be results that are confusing or concerning to you. Please understand that not all results are received at the same time and often I may need to interpret multiple results in order to provide you with the best plan of care or course of treatment. Therefore, I ask that you please give me 2 business days to thoroughly review all your results before contacting my office for clarification. Should we see a critical lab result, you will be contacted sooner.   It was a pleasure seeing you today! Please do not hesitate to reach out with any questions and or concerns.  Regards,   Eugenia Pancoast FNP-C

## 2021-07-03 NOTE — Progress Notes (Signed)
Established Patient Office Visit  Subjective:  Patient ID: Lorraine Ryan, female    DOB: 1955-06-12  Age: 66 y.o. MRN: [id number]  CC:  Chief Complaint  Patient presents with   Weight Loss    Want to talk about getting belly flattened.    HPI Lorraine Ryan is here today with concerns.   Still concerned with swelling bil lower legs.  Ekg 22, reviewed NSR with frequent PVCs Has compression stockings but is not wearing them, states hurts her too much.  States she has pain on her right back of her heel.  She has been on furosemide 40 mg once daily however she states has not taken medication for the last two weeks because she didn't realize I had sent a refill.  She did see cardiologist 2016, but not since. Repeat echocardiogram was ordered but never completed. Pt states sometimes her depression causes her to cancel appointments. Echo 2015 showed EF 50-55% and grade 1 diastolic dysfunction.   Pt is asking about getting laser surgery for tummy tuck. She called her insurance and they advised her to see her PCP. She states chronic back , and she feels she would benefit from this as far as it being less weight to have to carry around.   Pt is not yet established with a psychiatrist. She states she called on place, but did not have any luck getting an appt she states she will try again.   Past Medical History:  Diagnosis Date   Anxiety    Arthritis    Back pain    Bipolar disorder ([place])    Chronic headaches    Dementia (HCC)    Depression    Diabetes mellitus without complication (HCC)    GERD (gastroesophageal reflux disease)    Hypertension    No meds prescribed; states intermittent   Neuromuscular disorder (HCC)    Schizoaffective psychosis ([place]) 06/27/2020   Shortness of breath dyspnea    Stroke ([place])    caused numbness in leg    Past Surgical History:  Procedure Laterality Date   ABDOMINAL HYSTERECTOMY     no cervix   COLONOSCOPY WITH PROPOFOL N/A 04/18/2015    OEU:MPNTIRWE hemorrhoids/diverticulosis sigmoid colon/   ESOPHAGOGASTRODUODENOSCOPY (EGD) WITH PROPOFOL N/A 04/18/2015   SLF:web in the proximal esophagus/dilated/   GANGLION CYST EXCISION     LUMBAR LAMINECTOMY/DECOMPRESSION MICRODISCECTOMY N/A 02/08/2015   Procedure: L4-5 Decompression;  Surgeon: Marybelle Killings, MD;  Location: [facility];  Service: Orthopedics;  Laterality: N/A;   POLYPECTOMY  04/18/2015   Procedure: POLYPECTOMY;  Surgeon: Danie Binder, MD;  [facility];  Service: Endoscopy;;  ascending colon polyp   TONSILLECTOMY      Family History  Problem Relation Age of Onset   Diabetes Mother    Hypertension Mother    Heart disease Other        No family history   Breast cancer Sister        diagnosed in her 53's   Anxiety disorder Sister    Depression Sister    Anxiety disorder Brother    Depression Brother    Anxiety disorder Brother    Depression Brother    Cancer Maternal Grandmother        unknown type   Colon cancer Neg Hx     Social History   Socioeconomic History   Marital status: Single    Spouse name: Not on file   Number of children: 5   Years of education: Not  on file   Highest education level: Associate degree: occupational, technical, or vocational program  Occupational History   Not on file  Tobacco Use   Smoking status: Former    Packs/day: 0.50    Years: 10.00    Pack years: 5.00    Types: Cigarettes    Quit date: 03/29/1985    Years since quitting: 36.3   Smokeless tobacco: Never  Vaping Use   Vaping Use: Never used  Substance and Sexual Activity   Alcohol use: No    Alcohol/week: 0.0 standard drinks    Comment: Used to drink heavily, no ETOH in "30-some years"   Drug use: No    Comment: History of crack, "no drugs in 30-some years"   Sexual activity: Not Currently  Other Topics Concern   Not on file  Social History Narrative   She is no longer living with [place]- her sone >1.5 years ago. [name] got married yest and pans to go  into the [employer]. She no longer has a place to live.    She is living in her truck and plans to go to [place] to be with other family    She has other children in       Exercise: gets in the pool  and showers there   Diet: eats a lot of fast food   Social Determinants of Health   Financial Resource Strain: Not on file  Food Insecurity: Not on file  Transportation Needs: Not on file  Physical Activity: Not on file  Stress: Not on file  Social Connections: Not on file  Intimate Partner Violence: Not on file    Outpatient Medications Prior to Visit  Medication Sig Dispense Refill   gabapentin (NEURONTIN) 300 MG capsule Take 300 mg by mouth See admin instructions. Take one po qam, one po at lunch time, and two tablets qhs     lisinopril (ZESTRIL) 20 MG tablet TAKE 1 TABLET BY MOUTH EVERY DAY 90 tablet 3   metoprolol succinate (TOPROL XL) 25 MG 24 hr tablet Take 1 tablet (25 mg total) by mouth daily. 30 tablet 0   omeprazole (PRILOSEC) 40 MG capsule TAKE 1 CAPSULE (40 MG TOTAL) BY MOUTH DAILY. 30 capsule 0   furosemide (LASIX) 40 MG tablet Take 1 tablet (40 mg total) by mouth daily. 30 tablet 3   potassium chloride SA (KLOR-CON M) 20 MEQ tablet Take 1 tablet (20 mEq total) by mouth 2 (two) times daily for 14 days. For low potassium. 28 tablet 0   No facility-administered medications prior to visit.    Allergies  Allergen Reactions   Codeine Other (See Comments)    Headache   Oxycodone Nausea Only and Palpitations   Prednisone Other (See Comments)    Headache   Tramadol Nausea Only    ROS Review of Systems    Objective:    Physical Exam Constitutional:      General: She is not in acute distress.    Appearance: Normal appearance. She is obese. She is not ill-appearing, toxic-appearing or diaphoretic.  Cardiovascular:     Rate and Rhythm: Normal rate and regular rhythm.     Pulses:          Carotid pulses are 2+ on the right side and 2+ on the left side.      Radial  pulses are 2+ on the right side and 2+ on the left side.       Femoral pulses are 2+ on the right side  and 2+ on the left side.      Popliteal pulses are 2+ on the right side and 2+ on the left side.       Dorsalis pedis pulses are 2+ on the right side and 2+ on the left side.       Posterior tibial pulses are 2+ on the right side and 2+ on the left side.  Pulmonary:     Effort: Pulmonary effort is normal.     Breath sounds: Normal breath sounds.  Abdominal:     General: Abdomen is flat. Bowel sounds are normal.     Palpations: Abdomen is soft.     Tenderness: There is no abdominal tenderness.  Musculoskeletal:     Right lower leg: 2+ Edema present.     Left lower leg: 2+ Edema present.  Skin:    General: Skin is warm.  Neurological:     Mental Status: She is alert and oriented to person, place, and time. Mental status is at baseline.  Psychiatric:        Attention and Perception: She perceives auditory (speaking to other voices during visit) hallucinations.        Mood and Affect: Affect is inappropriate.    BP 110/64   Pulse 94   Temp 98.5 F (36.9 C)   Resp 16   Ht '5\' 6"'$  (1.676 m)   Wt 292 lb 3 oz (132.5 kg)   SpO2 96%   BMI 47.16 kg/m  Wt Readings from Last 3 Encounters:  07/03/21 292 lb 3 oz (132.5 kg)  06/21/21 291 lb 9 oz (132.3 kg)  06/08/21 286 lb 6 oz (129.9 kg)     Health Maintenance Due  Topic Date Due   Zoster Vaccines- Shingrix (1 of 2) Never done   COVID-19 Vaccine (2 - Janssen risk series) 05/21/2019   DEXA SCAN  Never done   MAMMOGRAM  08/10/2020    There are no preventive care reminders to display for this patient.  Lab Results  Component Value Date   TSH 1.54 06/21/2021   Lab Results  Component Value Date   WBC 5.8 06/01/2021   HGB 13.7 06/01/2021   HCT 42.2 06/01/2021   MCV 92.2 06/01/2021   PLT 225.0 06/01/2021   Lab Results  Component Value Date   NA 141 06/21/2021   K 3.5 06/21/2021   CO2 29 06/21/2021   GLUCOSE 89 06/21/2021    BUN 11 06/21/2021   CREATININE 0.81 06/21/2021   BILITOT 1.2 06/01/2021   ALKPHOS 68 06/01/2021   AST 20 06/01/2021   ALT 14 06/01/2021   PROT 7.4 06/01/2021   ALBUMIN 4.1 06/01/2021   CALCIUM 9.7 06/21/2021   ANIONGAP 11 04/16/2021   GFR 75.73 06/21/2021   Lab Results  Component Value Date   HGBA1C 5.7 (A) 07/03/2021      Assessment & Plan:   Problem List Items Addressed This Visit       Other   Schizoaffective psychosis ([place])    D/w pt that in terms of having any type of surgery moving forward, first we need to establish with psychiatry and work on auditory hallucinations, and get right medical management. Pt states she will make a few more calls to try to get established. Have again given her a list to call.        Hypokalemia    Continue potassium 20 meq once daily        Pain of right heel - Primary  At this point pt would like to be referred to podiatry for ongoing pain in heels  Suspected achilles tendinitis.  Work on banding leg        Relevant Orders   Ambulatory referral to Podiatry   Bilateral lower extremity edema    Restart lasix 20 mg once daily.  Continue potassium cl 20 meq once daily.  F/u in two weeks to repeat bmp and make sure potassium replacement doing well and fluid improving in legs         Relevant Orders   ECHOCARDIOGRAM COMPLETE   Prediabetes    a1c ordered today  Work on diabetic diet exercise as tolerated       Relevant Orders   POCT glycosylated hemoglobin (Hb A1C) (Completed)   Dyspnea on exertion    Reviewed cxr bnp normal however still with leg swelling and doe Order echocardiogram to r/o heart failure.        Relevant Orders   ECHOCARDIOGRAM COMPLETE   RESOLVED: Pedal edema    No orders of the defined types were placed in this encounter.   Follow-up: Return in about 2 weeks (around 07/17/2021) for follow up last testing.    Eugenia Pancoast, FNP

## 2021-07-04 ENCOUNTER — Other Ambulatory Visit: Payer: Self-pay | Admitting: Family

## 2021-07-04 DIAGNOSIS — R0609 Other forms of dyspnea: Secondary | ICD-10-CM

## 2021-07-04 DIAGNOSIS — R600 Localized edema: Secondary | ICD-10-CM

## 2021-07-04 DIAGNOSIS — Z79899 Other long term (current) drug therapy: Secondary | ICD-10-CM

## 2021-07-04 MED ORDER — POTASSIUM CHLORIDE CRYS ER 20 MEQ PO TBCR: 20.0000 meq | EXTENDED_RELEASE_TABLET | Freq: Every day | ORAL | 3 refills | Status: DC

## 2021-07-04 MED ORDER — FUROSEMIDE 20 MG PO TABS: 20.0000 mg | ORAL_TABLET | Freq: Every day | ORAL | 3 refills | Status: DC

## 2021-07-09 DIAGNOSIS — R0609 Other forms of dyspnea: Secondary | ICD-10-CM | POA: Insufficient documentation

## 2021-07-09 DIAGNOSIS — R7303 Prediabetes: Secondary | ICD-10-CM | POA: Insufficient documentation

## 2021-07-09 NOTE — Assessment & Plan Note (Addendum)
Reviewed cxr bnp normal however still with leg swelling and doe Order echocardiogram to r/o heart failure.

## 2021-07-09 NOTE — Assessment & Plan Note (Signed)
D/w pt that in terms of having any type of surgery moving forward, first we need to establish with psychiatry and work on auditory hallucinations, and get right medical management. Pt states she will make a few more calls to try to get established. Have again given her a list to call.

## 2021-07-09 NOTE — Assessment & Plan Note (Signed)
Continue potassium 20 meq once daily

## 2021-07-09 NOTE — Assessment & Plan Note (Signed)
Restart lasix 20 mg once daily.  Continue potassium cl 20 meq once daily.  F/u in two weeks to repeat bmp and make sure potassium replacement doing well and fluid improving in legs

## 2021-07-09 NOTE — Assessment & Plan Note (Signed)
a1c ordered today  Work on diabetic diet exercise as tolerated

## 2021-07-09 NOTE — Assessment & Plan Note (Signed)
At this point pt would like to be referred to podiatry for ongoing pain in heels  Suspected achilles tendinitis.  Work on banding leg

## 2021-07-10 ENCOUNTER — Ambulatory Visit: Payer: Medicare Other | Admitting: Podiatry

## 2021-07-11 ENCOUNTER — Other Ambulatory Visit: Payer: Self-pay | Admitting: Family

## 2021-07-11 DIAGNOSIS — K219 Gastro-esophageal reflux disease without esophagitis: Secondary | ICD-10-CM

## 2021-07-12 ENCOUNTER — Ambulatory Visit: Payer: Medicare Other | Admitting: Nurse Practitioner

## 2021-07-17 ENCOUNTER — Other Ambulatory Visit: Payer: Self-pay | Admitting: Family

## 2021-07-17 DIAGNOSIS — R0609 Other forms of dyspnea: Secondary | ICD-10-CM

## 2021-07-17 DIAGNOSIS — R600 Localized edema: Secondary | ICD-10-CM

## 2021-07-20 NOTE — Telephone Encounter (Signed)
Called pharmacy and they stated that she picked up the medication on 07/04/2021. It is to early to refill. Called pt and left a vm to return call the office back.

## 2021-07-20 NOTE — Telephone Encounter (Signed)
Did she check with her pharmacy?  It looks like these were ordered at the end of May by [place].

## 2021-07-20 NOTE — Telephone Encounter (Signed)
Patient called stating she has been out of her Lasix for a week and she usually takes this 1 daily. Her legs are swollen and painful now. Please review.

## 2021-08-02 ENCOUNTER — Other Ambulatory Visit: Payer: Self-pay | Admitting: Family

## 2021-08-02 DIAGNOSIS — R0609 Other forms of dyspnea: Secondary | ICD-10-CM

## 2021-08-02 DIAGNOSIS — R600 Localized edema: Secondary | ICD-10-CM

## 2021-08-09 ENCOUNTER — Other Ambulatory Visit: Payer: Self-pay | Admitting: Family

## 2021-08-09 DIAGNOSIS — E785 Hyperlipidemia, unspecified: Secondary | ICD-10-CM

## 2021-08-14 ENCOUNTER — Other Ambulatory Visit: Payer: Self-pay | Admitting: Family

## 2021-08-14 DIAGNOSIS — K219 Gastro-esophageal reflux disease without esophagitis: Secondary | ICD-10-CM

## 2021-08-24 NOTE — Telephone Encounter (Signed)
Does not look like patient was ever contacted to come back in to recheck potassium level back in May.  Would you still like to have this done??

## 2021-08-29 ENCOUNTER — Other Ambulatory Visit: Payer: Self-pay | Admitting: Family

## 2021-08-29 ENCOUNTER — Ambulatory Visit (INDEPENDENT_AMBULATORY_CARE_PROVIDER_SITE_OTHER): Payer: Medicare Other | Admitting: Podiatry

## 2021-08-29 ENCOUNTER — Ambulatory Visit (INDEPENDENT_AMBULATORY_CARE_PROVIDER_SITE_OTHER): Payer: Medicare Other

## 2021-08-29 DIAGNOSIS — M79674 Pain in right toe(s): Secondary | ICD-10-CM | POA: Diagnosis not present

## 2021-08-29 DIAGNOSIS — M7661 Achilles tendinitis, right leg: Secondary | ICD-10-CM

## 2021-08-29 DIAGNOSIS — B351 Tinea unguium: Secondary | ICD-10-CM | POA: Diagnosis not present

## 2021-08-29 DIAGNOSIS — M79675 Pain in left toe(s): Secondary | ICD-10-CM

## 2021-08-29 MED ORDER — BETAMETHASONE SOD PHOS & ACET 6 (3-3) MG/ML IJ SUSP
3.0000 mg | Freq: Once | INTRAMUSCULAR | Status: AC
  Administered 2021-08-29: 3 mg via INTRA_ARTICULAR

## 2021-08-29 NOTE — Progress Notes (Signed)
Chief Complaint  Patient presents with   Foot Pain    Pain of right heel     HPI: 66 y.o. female presenting today as a new patient for evaluation of pain and tenderness to the right posterior heel that has been going on for about 1 year now.  She denies a history of injury.  Aggravated with pressure.  Patient is diabetic and takes gabapentin as per PCP.  She says the pain is increased when she goes barefoot.  Past Medical History:  Diagnosis Date   Anxiety    Arthritis    Back pain    Bipolar disorder ([place])    Chronic headaches    Dementia (HCC)    Depression    Diabetes mellitus without complication (HCC)    GERD (gastroesophageal reflux disease)    Hypertension    No meds prescribed; states intermittent   Neuromuscular disorder (HCC)    Schizoaffective psychosis ([place]) 06/27/2020   Shortness of breath dyspnea    Stroke ([place])    caused numbness in leg    Past Surgical History:  Procedure Laterality Date   ABDOMINAL HYSTERECTOMY     no cervix   COLONOSCOPY WITH PROPOFOL N/A 04/18/2015   EGB:[id number] hemorrhoids/diverticulosis sigmoid colon/   ESOPHAGOGASTRODUODENOSCOPY (EGD) WITH PROPOFOL N/A 04/18/2015   SLF:web in the proximal esophagus/dilated/   GANGLION CYST EXCISION     LUMBAR LAMINECTOMY/DECOMPRESSION MICRODISCECTOMY N/A 02/08/2015   Procedure: L4-5 Decompression;  Surgeon: Marybelle Killings, MD;  Location: [facility];  Service: Orthopedics;  Laterality: N/A;   POLYPECTOMY  04/18/2015   Procedure: POLYPECTOMY;  Surgeon: Danie Binder, MD;  [facility];  Service: Endoscopy;;  ascending colon polyp   TONSILLECTOMY      Allergies  Allergen Reactions   Codeine Other (See Comments)    Headache   Oxycodone Nausea Only and Palpitations   Prednisone Other (See Comments)    Headache   Tramadol Nausea Only     Physical Exam: General: The patient is alert and oriented x3 in no acute distress.  Dermatology: Skin is warm, dry and supple bilateral lower extremities.  Negative for open lesions or macerations.  Hyperkeratotic dystrophic nails noted 1-5 bilateral  Vascular: Palpable pedal pulses bilaterally. Capillary refill within normal limits.  Negative for any significant edema or erythema  Neurological: Light touch and protective threshold grossly intact  Musculoskeletal Exam: No pedal deformities noted.  Pain on palpation noted along the Achilles tendon as it inserts onto the posterior tubercle of the calcaneus  Radiographic Exam:  Normal osseous mineralization. Joint spaces preserved. No fracture/dislocation/boney destruction.  Thickening of the Achilles tendon as it inserts onto the posterior tubercle of the calcaneus.  Assessment: 1.  Insertional Achilles tendinitis right Pain due to onychomycosis of toenails both   Plan of Care:  1. Patient evaluated. X-Rays reviewed.  2.  Injection of 0.5 cc Celestone Soluspan injected along the medial aspect of the posterior heel 3.  Prescription for meloxicam 15 mg daily.  Patient allergic to oral prednisone 4.  Mechanical debridement of nails 1-5 bilateral was performed using a nail nipper without incident or bleeding 5.  Return to clinic 4 weeks     Edrick Kins, DPM [facility]  Dr. Edrick Kins, DPM    [address]  [place], [place] [address]                Office [phone]  Fax [phone]

## 2021-08-30 ENCOUNTER — Telehealth: Payer: Self-pay | Admitting: Podiatry

## 2021-08-30 ENCOUNTER — Other Ambulatory Visit: Payer: Self-pay | Admitting: Podiatry

## 2021-08-30 MED ORDER — MELOXICAM 15 MG PO TABS: 15.0000 mg | ORAL_TABLET | Freq: Every day | ORAL | 1 refills | Status: DC

## 2021-08-30 NOTE — Telephone Encounter (Signed)
Patient said that her medication was not called in yesterday.    [facility] [place],  please advise

## 2021-08-30 NOTE — Telephone Encounter (Signed)
Pt called back to see if Dr. Amalia Hailey can go ahead and send un her medication to [facility] [place].

## 2021-08-30 NOTE — Telephone Encounter (Signed)
Prescription sent to the pharmacy.  Sorry for the delay.  Please notify patient.  Thanks, Dr. Amalia Hailey

## 2021-09-01 ENCOUNTER — Encounter: Payer: Self-pay | Admitting: Podiatry

## 2021-09-03 NOTE — Telephone Encounter (Signed)
Called the patient to inform her that the rx has been sent to the pharmacy for pickup

## 2021-10-01 ENCOUNTER — Encounter: Payer: Self-pay | Admitting: Physician Assistant

## 2021-10-02 DIAGNOSIS — M79604 Pain in right leg: Secondary | ICD-10-CM | POA: Diagnosis not present

## 2021-10-02 DIAGNOSIS — R600 Localized edema: Secondary | ICD-10-CM | POA: Diagnosis not present

## 2021-10-02 DIAGNOSIS — M79605 Pain in left leg: Secondary | ICD-10-CM | POA: Diagnosis not present

## 2021-10-02 DIAGNOSIS — M79662 Pain in left lower leg: Secondary | ICD-10-CM | POA: Diagnosis not present

## 2021-10-03 ENCOUNTER — Ambulatory Visit: Payer: Self-pay | Admitting: Podiatry

## 2021-10-05 ENCOUNTER — Other Ambulatory Visit: Payer: Self-pay | Admitting: Family

## 2021-10-05 DIAGNOSIS — E785 Hyperlipidemia, unspecified: Secondary | ICD-10-CM

## 2021-10-16 ENCOUNTER — Ambulatory Visit: Payer: Self-pay | Admitting: Family

## 2021-10-20 ENCOUNTER — Other Ambulatory Visit: Payer: Self-pay | Admitting: Family

## 2021-10-20 DIAGNOSIS — K219 Gastro-esophageal reflux disease without esophagitis: Secondary | ICD-10-CM

## 2021-10-31 NOTE — Progress Notes (Deleted)
10/31/2021 Jessilyn Catino [id number] 1955/12/17  Referring provider: Eugenia Pancoast, FNP Primary GI doctor: {acdocs:27040} ( Dr. Oneida Alar at [facility])  ASSESSMENT AND PLAN:   There are no diagnoses linked to this encounter.   Patient Care Team: Eugenia Pancoast, FNP as PCP - General (Family Medicine) Danie Binder, MD (Inactive) as Consulting Physician (Gastroenterology)  HISTORY OF PRESENT ILLNESS: 66 y.o. female with a past medical history of hypertension, CVA, schizoaffective psychosis with depression, history of hysterectomy and others listed below presents for evaluation of abdominal pain and bloating.   04/18/2015 upper endoscopy and colonoscopy for GERD and screening purposes at [facility] at [place] in sigmoid, 3 mm benign polyp ascending colon, external/internal hemorrhoids. Web found in proximal esophagus status post dilatation 17 mm stomach duodenum normal no specimens. 06/22/2021 right upper quadrant ultrasound shows hepatic steatosis, gallbladder wall adenomyomatosis Reviewing labs from 06/21/2021 patient has no anemia, no leukocytosis, normal kidney liver.   She  reports that she quit smoking about 36 years ago. Her smoking use included cigarettes. She has a 5.00 pack-year smoking history. She has never used smokeless tobacco. She reports that she does not drink alcohol and does not use drugs.  Current Medications:    Current Outpatient Medications (Cardiovascular):    furosemide (LASIX) 20 MG tablet, TAKE 1 TABLET BY MOUTH EVERY DAY   lisinopril (ZESTRIL) 20 MG tablet, TAKE 1 TABLET BY MOUTH EVERY DAY   metoprolol succinate (TOPROL XL) 25 MG 24 hr tablet, Take 1 tablet (25 mg total) by mouth daily.   rosuvastatin (CRESTOR) 40 MG tablet, TAKE 1 TABLET BY MOUTH DAILY   Current Outpatient Medications (Analgesics):    meloxicam (MOBIC) 15 MG tablet, Take 1 tablet (15 mg total) by mouth daily.   Current Outpatient Medications (Other):     gabapentin (NEURONTIN) 300 MG capsule, TAKE 1 CAPSULE BY MOUTH 3 TIMES A DAY   KLOR-CON M20 20 MEQ tablet, TAKE 1 TABLET BY MOUTH EVERY DAY   omeprazole (PRILOSEC) 40 MG capsule, TAKE 1 CAPSULE BY MOUTH DAILY  Medical History:  Past Medical History:  Diagnosis Date   Anxiety    Arthritis    Back pain    Bipolar disorder (HCC)    Chronic headaches    Dementia (HCC)    Depression    Diabetes mellitus without complication (HCC)    GERD (gastroesophageal reflux disease)    Hypertension    No meds prescribed; states intermittent   Neuromuscular disorder ([place])    Schizoaffective psychosis ([place]) 06/27/2020   Shortness of breath dyspnea    Stroke ([place])    caused numbness in leg   Allergies:  Allergies  Allergen Reactions   Codeine Other (See Comments)    Headache   Oxycodone Nausea Only and Palpitations   Prednisone Other (See Comments)    Headache   Tramadol Nausea Only     Surgical History:  She  has a past surgical history that includes Ganglion cyst excision; Tonsillectomy; Lumbar laminectomy/decompression microdiscectomy (N/A, 02/08/2015); Colonoscopy with propofol (N/A, 04/18/2015); Esophagogastroduodenoscopy (egd) with propofol (N/A, 04/18/2015); polypectomy (04/18/2015); and Abdominal hysterectomy. Family History:  Her family history includes Anxiety disorder in her brother, brother, and sister; Breast cancer in her sister; Cancer in her maternal grandmother; Depression in her brother, brother, and sister; Diabetes in her mother; Heart disease in an other family member; Hypertension in her mother.  REVIEW OF SYSTEMS  : All other systems reviewed and negative except where noted in the History of Present  Illness.  PHYSICAL EXAM: There were no vitals taken for this visit. General:   Pleasant, well developed female in no acute distress Head:   Normocephalic and atraumatic. Eyes:  sclerae anicteric,conjunctive pink  Heart:   {HEART EXAM HEM/ONC:21750} Pulm:  Clear anteriorly; no  wheezing Abdomen:   {BlankSingle:19197::"Distended","Ridged","Soft"}, {BlankSingle:19197::"Flat","Obese","Non-distended"} AB, {BlankSingle:19197::"Absent","Hyperactive, tinkling","Hypoactive","Sluggish","Active"} bowel sounds. {actendernessAB:27319} tenderness {anatomy; site abdomen:5010}. {BlankMultiple:19196::"Without guarding","With guarding","Without rebound","With rebound"}, No organomegaly appreciated. Rectal: {acrectalexam:27461} Extremities:  {With/Without:304960234} edema. Msk: Symmetrical without gross deformities. Peripheral pulses intact.  Neurologic:  Alert and  oriented x4;  No focal deficits.  Skin:   Dry and intact without significant lesions or rashes. Psychiatric:  Cooperative. Normal mood and affect.    Vladimir Crofts, PA-C 8:13 AM

## 2021-11-01 ENCOUNTER — Ambulatory Visit: Payer: Self-pay | Admitting: Physician Assistant

## 2021-11-06 ENCOUNTER — Ambulatory Visit: Payer: Self-pay | Admitting: Family Medicine

## 2021-11-24 ENCOUNTER — Other Ambulatory Visit: Payer: Self-pay | Admitting: Family

## 2021-11-24 DIAGNOSIS — I10 Essential (primary) hypertension: Secondary | ICD-10-CM

## 2021-11-26 ENCOUNTER — Telehealth: Payer: Self-pay

## 2021-11-26 ENCOUNTER — Encounter: Payer: Self-pay | Admitting: Family

## 2021-11-26 ENCOUNTER — Ambulatory Visit: Payer: Medicare Other | Admitting: Internal Medicine

## 2021-11-26 NOTE — Telephone Encounter (Signed)
lvmtcb

## 2021-11-26 NOTE — Telephone Encounter (Signed)
[facility] Night - Client Nonclinical Telephone Record  [facility] Client [facility] Night - Client Client Site [place] - Night Provider AA - PHYSICIAN, Verita Schneiders- MD Contact Type Call Who Is Calling Patient / Member / Family / Caregiver Caller Name Lorraine Ryan Caller Phone Number [phone] Patient Name Lorraine Ryan Patient DOB October 15, 1955 Call Type Message Only Information Provided Reason for Call Request to Reschedule Office Appointment Initial Comment Caller states, needs to reschedule appt. at 8am tomorrow. will call back to reschedule. Patient request to speak to RN No Disp. Time Disposition Final User 11/25/2021 6:57:19 PM General Information Provided Yes Jobie Quaker Call Closed By: Jobie Quaker Transaction Date/Time: 11/25/2021 6:55:09 PM (ET

## 2021-11-26 NOTE — Telephone Encounter (Signed)
ERROR

## 2021-11-28 ENCOUNTER — Emergency Department (HOSPITAL_COMMUNITY)
Admission: EM | Admit: 2021-11-28 | Discharge: 2021-11-28 | Disposition: A | Discharge status: Home / Self Care | Payer: Medicare Other | Attending: Emergency Medicine | Admitting: Emergency Medicine

## 2021-11-28 ENCOUNTER — Emergency Department (HOSPITAL_COMMUNITY): Payer: Medicare Other

## 2021-11-28 ENCOUNTER — Other Ambulatory Visit: Payer: Self-pay

## 2021-11-28 DIAGNOSIS — R0789 Other chest pain: Secondary | ICD-10-CM | POA: Diagnosis present

## 2021-11-28 DIAGNOSIS — Z79899 Other long term (current) drug therapy: Secondary | ICD-10-CM | POA: Insufficient documentation

## 2021-11-28 DIAGNOSIS — E119 Type 2 diabetes mellitus without complications: Secondary | ICD-10-CM | POA: Insufficient documentation

## 2021-11-28 DIAGNOSIS — F0390 Unspecified dementia without behavioral disturbance: Secondary | ICD-10-CM | POA: Diagnosis not present

## 2021-11-28 DIAGNOSIS — R079 Chest pain, unspecified: Secondary | ICD-10-CM

## 2021-11-28 LAB — HEPATIC FUNCTION PANEL
ALT: 10 U/L (ref 0–44)
AST: 18 U/L (ref 15–41)
Albumin: 3.5 g/dL (ref 3.5–5.0)
Alkaline Phosphatase: 74 U/L (ref 38–126)
Bilirubin, Direct: 0.2 mg/dL (ref 0.0–0.2)
Indirect Bilirubin: 0.8 mg/dL (ref 0.3–0.9)
Total Bilirubin: 1 mg/dL (ref 0.3–1.2)
Total Protein: 7.1 g/dL (ref 6.5–8.1)

## 2021-11-28 LAB — BASIC METABOLIC PANEL
Anion gap: 10 (ref 5–15)
BUN: 8 mg/dL (ref 8–23)
CO2: 27 mmol/L (ref 22–32)
Calcium: 9.7 mg/dL (ref 8.9–10.3)
Chloride: 104 mmol/L (ref 98–111)
Creatinine, Ser: 0.74 mg/dL (ref 0.44–1.00)
GFR, Estimated: >60 mL/min (ref >60)
Glucose, Bld: 104 mg/dL — ABNORMAL HIGH (ref 70–99)
Potassium: 3.7 mmol/L (ref 3.5–5.1)
Sodium: 141 mmol/L (ref 135–145)

## 2021-11-28 LAB — BRAIN NATRIURETIC PEPTIDE: B Natriuretic Peptide: 13.5 pg/mL (ref 0.0–100.0)

## 2021-11-28 LAB — CBC
HCT: 43.6 % (ref 36.0–46.0)
Hemoglobin: 14.3 g/dL (ref 12.0–15.0)
MCH: 30 pg (ref 26.0–34.0)
MCHC: 32.8 g/dL (ref 30.0–36.0)
MCV: 91.4 fL (ref 80.0–100.0)
Platelets: 205 10*3/uL (ref 150–400)
RBC: 4.77 MIL/uL (ref 3.87–5.11)
RDW: 13.8 % (ref 11.5–15.5)
WBC: 7.3 10*3/uL (ref 4.0–10.5)
nRBC: 0 % (ref 0.0–0.2)

## 2021-11-28 LAB — TROPONIN I (HIGH SENSITIVITY)
Troponin I (High Sensitivity): 3 ng/L (ref <18)
Troponin I (High Sensitivity): 3 ng/L (ref <18)

## 2021-11-28 LAB — LIPASE, BLOOD: Lipase: 48 U/L (ref 11–51)

## 2021-11-28 MED ORDER — KETOROLAC TROMETHAMINE 60 MG/2ML IM SOLN
60.0000 mg | Freq: Once | INTRAMUSCULAR | Status: AC
  Administered 2021-11-28: 60 mg via INTRAMUSCULAR
  Filled 2021-11-28: qty 2

## 2021-11-28 NOTE — ED Provider Notes (Signed)
[facility] EMERGENCY DEPARTMENT Provider Note   CSN: [id number] Arrival date & time: 11/28/21  9675     History  Chief Complaint  Patient presents with   Chest Pain    Lorraine Ryan is a 66 y.o. female.  HPI 65 year old female with a history of pretension, stroke, depression, dementia, neuromuscular disorder, bipolar disorder, schizoaffective disorder, DM type II presents to the ER with complaints of left-sided chest pain which started this morning.  Patient is a difficult historian but states that she noted left-sided chest pain and states that it was so painful that she could not move her left arm.  She denies any recent falls or injuries.  Denies any associated shortness of breath or dizziness or syncope.  Denies any prior cardiac history in the past.  No cough, fevers or chills.  No abdominal pain, nausea or vomiting.    Home Medications Prior to Admission medications   Medication Sig Start Date End Date Taking? Authorizing Provider  lisinopril (ZESTRIL) 20 MG tablet TAKE 1 TABLET BY MOUTH EVERY DAY Patient taking differently: Take 20 mg by mouth daily. 06/25/21  Yes Eugenia Pancoast, FNP  metoprolol succinate (TOPROL XL) 25 MG 24 hr tablet Take 1 tablet (25 mg total) by mouth daily. 06/01/21  Yes Dugal, Lawerance Bach, FNP  Multiple Vitamin (MULTIVITAMIN) tablet Take 1 tablet by mouth daily.   Yes [provider]  omeprazole (PRILOSEC) 40 MG capsule TAKE 1 CAPSULE BY MOUTH DAILY 10/22/21  Yes Dugal, Tabitha, FNP  rosuvastatin (CRESTOR) 40 MG tablet TAKE 1 TABLET BY MOUTH DAILY 10/05/21  Yes Dugal, Tabitha, FNP  furosemide (LASIX) 20 MG tablet TAKE 1 TABLET BY MOUTH EVERY DAY Patient not taking: Reported on 11/28/2021 07/22/21   Eugenia Pancoast, FNP  gabapentin (NEURONTIN) 300 MG capsule TAKE 1 CAPSULE BY MOUTH 3 TIMES A DAY Patient not taking: Reported on 11/28/2021 08/30/21   Eugenia Pancoast, FNP  KLOR-CON M20 20 MEQ tablet TAKE 1 TABLET BY MOUTH EVERY  DAY Patient not taking: Reported on 11/28/2021 07/22/21   Eugenia Pancoast, FNP  meloxicam (MOBIC) 15 MG tablet Take 1 tablet (15 mg total) by mouth daily. Patient not taking: Reported on 11/28/2021 08/30/21   Edrick Kins, DPM      Allergies    Codeine, Oxycontin [oxycodone], Prednisone, and Ultram [tramadol]    Review of Systems   Review of Systems Ten systems reviewed and are negative for acute change, except as noted in the HPI.   Physical Exam Updated Vital Signs BP 115/67 (BP Location: Right Arm)   Pulse 70   Temp 98.9 F (37.2 C) (Oral)   Resp 18   Ht '5\' 6"'$  (1.676 m)   Wt 130.2 kg   SpO2 98%   BMI 46.32 kg/m  Physical Exam Vitals and nursing note reviewed.  Constitutional:      General: She is not in acute distress.    Appearance: She is well-developed.  HENT:     Head: Normocephalic and atraumatic.  Eyes:     Conjunctiva/sclera: Conjunctivae normal.  Cardiovascular:     Rate and Rhythm: Normal rate and regular rhythm.     Heart sounds: No murmur heard. Pulmonary:     Effort: Pulmonary effort is normal. No respiratory distress.     Breath sounds: Normal breath sounds.  Chest:       Comments: Reproducible chest wall tenderness near the axillary area.  No overlying skin changes, no erythema, warmth, rashes, no fluctuance.  Able to range left  arm, 5/5 strength in left upper extremity, sensations intact. Abdominal:     Palpations: Abdomen is soft.     Tenderness: There is no abdominal tenderness.  Musculoskeletal:        General: No swelling.     Cervical back: Neck supple.  Skin:    General: Skin is warm and dry.     Capillary Refill: Capillary refill takes less than 2 seconds.  Neurological:     Mental Status: She is alert.  Psychiatric:        Mood and Affect: Mood normal.     ED Results / Procedures / Treatments   Labs (all labs ordered are listed, but only abnormal results are displayed) Labs Reviewed  BASIC METABOLIC PANEL - Abnormal; Notable for  the following components:      Result Value   Glucose, Bld 104 (*)    All other components within normal limits  CBC  HEPATIC FUNCTION PANEL  BRAIN NATRIURETIC PEPTIDE  LIPASE, BLOOD  TROPONIN I (HIGH SENSITIVITY)  TROPONIN I (HIGH SENSITIVITY)    EKG None  Radiology DG Chest 2 View  Result Date: 11/28/2021 CLINICAL DATA:  Left chest pain EXAM: CHEST - 2 VIEW COMPARISON:  06/21/21 FINDINGS: No pleural effusion. No pneumothorax. Low lung volumes with nonspecific bibasilar pulmonary opacities, which are slightly linear on the left. No displaced rib fractures. Visualized upper abdomen is unremarkable. IMPRESSION: Low lung volumes with nonspecific bibasilar pulmonary opacities, which are slightly linear on the left, favored to represent atelectasis, but superimposed infection is difficult to exclude. Electronically Signed   By: Marin Roberts M.D.   On: 11/28/2021 08:19    Procedures Procedures    Medications Ordered in ED Medications  ketorolac (TORADOL) injection 60 mg (60 mg Intramuscular Given 11/28/21 1254)    ED Course/ Medical Decision Making/ A&P                           Medical Decision Making Amount and/or Complexity of Data Reviewed Labs: ordered. Radiology: ordered.  Risk Prescription drug management.   INITIAL IMPRESSION / ASSESSMENT AND PLAN / ED COURSE   Pertinent labs & imaging results that were available during my care of the patient were reviewed by me and considered in my medical decision making (see chart for details).   This patient is Presenting for Evaluation of chest pain which does require a range of treatment options, and is a complaint that involves a high risk of morbidity and mortality.   The Differential Diagnoses includes but is not exclusive to The emergent causes of chest pain include: Acute coronary syndrome, tamponade, pericarditis/myocarditis, aortic dissection, pulmonary embolism, tension pneumothorax, pneumonia, and esophageal rupture,  pneumonia, musculoskeletal pain, gastric reflux     I decided to review pertinent External Data, and in summary chart review   Clinical Laboratory Tests ordered, reviewed, and interpreted by me   CBC unremarkable, BMP with a glucose of 104, no electrolyte abnormalities, delta troponin negative, negative hepatic function panel, lipase, BNP  Radiologic Tests Ordered, I independently interpreted the images and agree with radiology interpretation.   Chest x-ray low lung volumes, nonspecific bibasilar pulmonary opacities slightly linear on the left, represent atelectasis versus possible superimposed infection   Cardiac Monitor Tracing which shows normal sinus rhythm     Medical Decision Making: Summary:  Patient presenting with left-sided chest pain.  She is afebrile on arrival.  Her cardiac work-up is overall negative, with negative troponins and EKG.  She has a chest x-ray that shows questionable atelectasis versus superimposed infection, however she has left-sided reproducible chest wall tenderness and is able to point directly at that spot.  I have low suspicion for pneumonia at this time given no white count and no leukocytosis and no upper respiratory symptoms including cough, shortness of breath.  I suspect her pain is musculoskeletal in nature rather than infectious or cardiac.  Pain has resolved with Toradol which again I think points more so to the musculoskeletal cause.   Reevaluation with update and discussion with improvement in pain Considered admission however no indication given low suspicion for pneumonia, ACS, PE, likely pain is musculoskeletal Disposition: Stable for discharge with anti-inflammatories and PCP follow-up strict return precautions  Final Clinical Impression(s) / ED Diagnoses Final diagnoses:  Chest pain, unspecified type    Rx / DC Orders ED Discharge Orders     None         Lyndel Safe 11/28/21 1535    Godfrey Pick, MD 12/01/21  2125

## 2021-11-28 NOTE — Discharge Instructions (Addendum)
You were evaluated in the Emergency Department and after careful evaluation, we did not find any emergent condition requiring admission or further testing in the hospital.  Your work-up today was overall reassuring.  I suspect your symptoms are due to muscular pain, you may take over-the-counter anti-inflammatory such as Tylenol and ibuprofen as well as a heating pad.  Please return to the Emergency Department if you experience any worsening of your condition.  We encourage you to follow up with a primary care provider.  Thank you for allowing [place] to be a part of your care.

## 2021-11-28 NOTE — ED Notes (Signed)
Attempted to obtain lab work x 2. Unable to obtain labs. Provider aware.

## 2021-11-28 NOTE — ED Triage Notes (Signed)
Pt. Stated, I m having Chest pain started this morning.

## 2021-11-28 NOTE — ED Notes (Signed)
Got patient into a gown on the monitor patient is resting with call bell in reach got patient a warm blanket  °

## 2021-12-03 ENCOUNTER — Telehealth: Payer: Self-pay

## 2021-12-03 ENCOUNTER — Encounter (INDEPENDENT_AMBULATORY_CARE_PROVIDER_SITE_OTHER): Payer: Self-pay

## 2021-12-03 VITALS — Ht 66.0 in | Wt 292.0 lb

## 2021-12-03 NOTE — Progress Notes (Signed)
   Subjective:  This encounter was created in error - please disregard. Patient request call back during this visit Patient was called X 4 with no answer. Left message on voicemail okay to reschedule.

## 2021-12-03 NOTE — Patient Instructions (Signed)
Ms. Lorraine Ryan , Thank you for taking time to come for your Medicare Wellness Visit. I appreciate your ongoing commitment to your health goals. Please review the following plan we discussed and let me know if I can assist you in the future.   These are the goals we discussed:  Goals       DIET - REDUCE PORTION SIZE (pt-stated)      I want to lose weight.         This is a list of the screening recommended for you and due dates:  Health Maintenance  Topic Date Due   Zoster (Shingles) Vaccine (1 of 2) Never done   COVID-19 Vaccine (2 - Janssen risk series) 05/21/2019   DEXA scan (bone density measurement)  Never done   Mammogram  08/10/2020   Flu Shot  09/04/2021   Pneumonia Vaccine (2 - PCV) 06/02/2022   Medicare Annual Wellness Visit  12/04/2022   Colon Cancer Screening  04/17/2025   Tetanus Vaccine  07/21/2027   Hepatitis C Screening: USPSTF Recommendation to screen - Ages 18-79 yo.  Completed   HPV Vaccine  Aged Out    Advanced directives:   Conditions/risks identified: None  Next appointment: Follow up in one year for your annual wellness visit    Preventive Care 65 Years and Older, Female Preventive care refers to lifestyle choices and visits with your health care provider that can promote health and wellness. What does preventive care include? A yearly physical exam. This is also called an annual well check. Dental exams once or twice a year. Routine eye exams. Ask your health care provider how often you should have your eyes checked. Personal lifestyle choices, including: Daily care of your teeth and gums. Regular physical activity. Eating a healthy diet. Avoiding tobacco and drug use. Limiting alcohol use. Practicing safe sex. Taking low-dose aspirin every day. Taking vitamin and mineral supplements as recommended by your health care provider. What happens during an annual well check? The services and screenings done by your health care provider during your  annual well check will depend on your age, overall health, lifestyle risk factors, and family history of disease. Counseling  Your health care provider may ask you questions about your: Alcohol use. Tobacco use. Drug use. Emotional well-being. Home and relationship well-being. Sexual activity. Eating habits. History of falls. Memory and ability to understand (cognition). Work and work Statistician. Reproductive health. Screening  You may have the following tests or measurements: Height, weight, and BMI. Blood pressure. Lipid and cholesterol levels. These may be checked every 5 years, or more frequently if you are over 31 years old. Skin check. Lung cancer screening. You may have this screening every year starting at age 57 if you have a 30-pack-year history of smoking and currently smoke or have quit within the past 15 years. Fecal occult blood test (FOBT) of the stool. You may have this test every year starting at age 36. Flexible sigmoidoscopy or colonoscopy. You may have a sigmoidoscopy every 5 years or a colonoscopy every 10 years starting at age 59. Hepatitis C blood test. Hepatitis B blood test. Sexually transmitted disease (STD) testing. Diabetes screening. This is done by checking your blood sugar (glucose) after you have not eaten for a while (fasting). You may have this done every 1-3 years. Bone density scan. This is done to screen for osteoporosis. You may have this done starting at age 43. Mammogram. This may be done every 1-2 years. Talk to your health care  provider about how often you should have regular mammograms. Talk with your health care provider about your test results, treatment options, and if necessary, the need for more tests. Vaccines  Your health care provider may recommend certain vaccines, such as: Influenza vaccine. This is recommended every year. Tetanus, diphtheria, and acellular pertussis (Tdap, Td) vaccine. You may need a Td booster every 10  years. Zoster vaccine. You may need this after age 13. Pneumococcal 13-valent conjugate (PCV13) vaccine. One dose is recommended after age 26. Pneumococcal polysaccharide (PPSV23) vaccine. One dose is recommended after age 45. Talk to your health care provider about which screenings and vaccines you need and how often you need them. This information is not intended to replace advice given to you by your health care provider. Make sure you discuss any questions you have with your health care provider. Document Released: 02/17/2015 Document Revised: 10/11/2015 Document Reviewed: 11/22/2014 Elsevier Interactive Patient Education  2017 [place] Prevention in the Home Falls can cause injuries. They can happen to people of all ages. There are many things you can do to make your home safe and to help prevent falls. What can I do on the outside of my home? Regularly fix the edges of walkways and driveways and fix any cracks. Remove anything that might make you trip as you walk through a door, such as a raised step or threshold. Trim any bushes or trees on the path to your home. Use bright outdoor lighting. Clear any walking paths of anything that might make someone trip, such as rocks or tools. Regularly check to see if handrails are loose or broken. Make sure that both sides of any steps have handrails. Any raised decks and porches should have guardrails on the edges. Have any leaves, snow, or ice cleared regularly. Use sand or salt on walking paths during winter. Clean up any spills in your garage right away. This includes oil or grease spills. What can I do in the bathroom? Use night lights. Install grab bars by the toilet and in the tub and shower. Do not use towel bars as grab bars. Use non-skid mats or decals in the tub or shower. If you need to sit down in the shower, use a plastic, non-slip stool. Keep the floor dry. Clean up any water that spills on the floor as soon as it  happens. Remove soap buildup in the tub or shower regularly. Attach bath mats securely with double-sided non-slip rug tape. Do not have throw rugs and other things on the floor that can make you trip. What can I do in the bedroom? Use night lights. Make sure that you have a light by your bed that is easy to reach. Do not use any sheets or blankets that are too big for your bed. They should not hang down onto the floor. Have a firm chair that has side arms. You can use this for support while you get dressed. Do not have throw rugs and other things on the floor that can make you trip. What can I do in the kitchen? Clean up any spills right away. Avoid walking on wet floors. Keep items that you use a lot in easy-to-reach places. If you need to reach something above you, use a strong step stool that has a grab bar. Keep electrical cords out of the way. Do not use floor polish or wax that makes floors slippery. If you must use wax, use non-skid floor wax. Do not have throw rugs  and other things on the floor that can make you trip. What can I do with my stairs? Do not leave any items on the stairs. Make sure that there are handrails on both sides of the stairs and use them. Fix handrails that are broken or loose. Make sure that handrails are as long as the stairways. Check any carpeting to make sure that it is firmly attached to the stairs. Fix any carpet that is loose or worn. Avoid having throw rugs at the top or bottom of the stairs. If you do have throw rugs, attach them to the floor with carpet tape. Make sure that you have a light switch at the top of the stairs and the bottom of the stairs. If you do not have them, ask someone to add them for you. What else can I do to help prevent falls? Wear shoes that: Do not have high heels. Have rubber bottoms. Are comfortable and fit you well. Are closed at the toe. Do not wear sandals. If you use a stepladder: Make sure that it is fully opened.  Do not climb a closed stepladder. Make sure that both sides of the stepladder are locked into place. Ask someone to hold it for you, if possible. Clearly mark and make sure that you can see: Any grab bars or handrails. First and last steps. Where the edge of each step is. Use tools that help you move around (mobility aids) if they are needed. These include: Canes. Walkers. Scooters. Crutches. Turn on the lights when you go into a dark area. Replace any light bulbs as soon as they burn out. Set up your furniture so you have a clear path. Avoid moving your furniture around. If any of your floors are uneven, fix them. If there are any pets around you, be aware of where they are. Review your medicines with your doctor. Some medicines can make you feel dizzy. This can increase your chance of falling. Ask your doctor what other things that you can do to help prevent falls. This information is not intended to replace advice given to you by your health care provider. Make sure you discuss any questions you have with your health care provider. Document Released: 11/17/2008 Document Revised: 06/29/2015 Document Reviewed: 02/25/2014 Elsevier Interactive Patient Education  2017 [employer].

## 2021-12-03 NOTE — Telephone Encounter (Signed)
Contacted patient on preferred number listed in notes for scheduled AWV. Patient request a call back during this visit to complete. Patient called X 4 with no answer. Left message on voicemail okay to reschedule

## 2021-12-14 ENCOUNTER — Encounter: Payer: Self-pay | Admitting: Family

## 2021-12-14 ENCOUNTER — Other Ambulatory Visit: Payer: Self-pay | Admitting: Family

## 2021-12-14 DIAGNOSIS — I10 Essential (primary) hypertension: Secondary | ICD-10-CM

## 2021-12-14 DIAGNOSIS — K219 Gastro-esophageal reflux disease without esophagitis: Secondary | ICD-10-CM

## 2022-01-14 ENCOUNTER — Encounter (HOSPITAL_COMMUNITY): Payer: Medicare Other

## 2022-01-14 ENCOUNTER — Emergency Department (HOSPITAL_BASED_OUTPATIENT_CLINIC_OR_DEPARTMENT_OTHER): Payer: Medicare Other

## 2022-01-14 ENCOUNTER — Encounter (HOSPITAL_COMMUNITY): Payer: Self-pay

## 2022-01-14 ENCOUNTER — Inpatient Hospital Stay (HOSPITAL_COMMUNITY)
Admission: EM | Admit: 2022-01-14 | Discharge: 2022-01-17 | DRG: 603 | Disposition: A | Discharge status: Home / Self Care | Payer: Medicare Other | Attending: Family Medicine | Admitting: Family Medicine

## 2022-01-14 ENCOUNTER — Emergency Department (HOSPITAL_COMMUNITY): Payer: Medicare Other

## 2022-01-14 ENCOUNTER — Other Ambulatory Visit: Payer: Self-pay

## 2022-01-14 DIAGNOSIS — F0393 Unspecified dementia, unspecified severity, with mood disturbance: Secondary | ICD-10-CM | POA: Diagnosis present

## 2022-01-14 DIAGNOSIS — F319 Bipolar disorder, unspecified: Secondary | ICD-10-CM | POA: Diagnosis present

## 2022-01-14 DIAGNOSIS — I10 Essential (primary) hypertension: Secondary | ICD-10-CM | POA: Diagnosis present

## 2022-01-14 DIAGNOSIS — E876 Hypokalemia: Secondary | ICD-10-CM | POA: Diagnosis present

## 2022-01-14 DIAGNOSIS — R0609 Other forms of dyspnea: Secondary | ICD-10-CM

## 2022-01-14 DIAGNOSIS — F419 Anxiety disorder, unspecified: Secondary | ICD-10-CM | POA: Diagnosis present

## 2022-01-14 DIAGNOSIS — R600 Localized edema: Secondary | ICD-10-CM

## 2022-01-14 DIAGNOSIS — Z5900 Homelessness unspecified: Secondary | ICD-10-CM | POA: Diagnosis not present

## 2022-01-14 DIAGNOSIS — R52 Pain, unspecified: Secondary | ICD-10-CM | POA: Diagnosis not present

## 2022-01-14 DIAGNOSIS — K219 Gastro-esophageal reflux disease without esophagitis: Secondary | ICD-10-CM | POA: Diagnosis present

## 2022-01-14 DIAGNOSIS — Z87891 Personal history of nicotine dependence: Secondary | ICD-10-CM

## 2022-01-14 DIAGNOSIS — R197 Diarrhea, unspecified: Secondary | ICD-10-CM | POA: Diagnosis present

## 2022-01-14 DIAGNOSIS — M1990 Unspecified osteoarthritis, unspecified site: Secondary | ICD-10-CM | POA: Diagnosis present

## 2022-01-14 DIAGNOSIS — F259 Schizoaffective disorder, unspecified: Secondary | ICD-10-CM | POA: Diagnosis present

## 2022-01-14 DIAGNOSIS — Z90710 Acquired absence of both cervix and uterus: Secondary | ICD-10-CM

## 2022-01-14 DIAGNOSIS — Z79899 Other long term (current) drug therapy: Secondary | ICD-10-CM

## 2022-01-14 DIAGNOSIS — L03115 Cellulitis of right lower limb: Secondary | ICD-10-CM | POA: Diagnosis present

## 2022-01-14 DIAGNOSIS — Z885 Allergy status to narcotic agent status: Secondary | ICD-10-CM | POA: Diagnosis not present

## 2022-01-14 DIAGNOSIS — E66813 Obesity, class 3: Secondary | ICD-10-CM | POA: Diagnosis present

## 2022-01-14 DIAGNOSIS — Z6841 Body Mass Index (BMI) 40.0 and over, adult: Secondary | ICD-10-CM

## 2022-01-14 DIAGNOSIS — Z8673 Personal history of transient ischemic attack (TIA), and cerebral infarction without residual deficits: Secondary | ICD-10-CM | POA: Diagnosis not present

## 2022-01-14 DIAGNOSIS — Z8249 Family history of ischemic heart disease and other diseases of the circulatory system: Secondary | ICD-10-CM | POA: Diagnosis not present

## 2022-01-14 DIAGNOSIS — Z818 Family history of other mental and behavioral disorders: Secondary | ICD-10-CM

## 2022-01-14 DIAGNOSIS — L0390 Cellulitis, unspecified: Secondary | ICD-10-CM | POA: Diagnosis present

## 2022-01-14 DIAGNOSIS — Z888 Allergy status to other drugs, medicaments and biological substances status: Secondary | ICD-10-CM

## 2022-01-14 DIAGNOSIS — L03116 Cellulitis of left lower limb: Secondary | ICD-10-CM | POA: Diagnosis present

## 2022-01-14 LAB — CBC WITH DIFFERENTIAL/PLATELET
Abs Immature Granulocytes: 0.12 10*3/uL — ABNORMAL HIGH (ref 0.00–0.07)
Basophils Absolute: 0.1 10*3/uL (ref 0.0–0.1)
Basophils Relative: 1 %
Eosinophils Absolute: 0.2 10*3/uL (ref 0.0–0.5)
Eosinophils Relative: 2 %
HCT: 40.2 % (ref 36.0–46.0)
Hemoglobin: 13.3 g/dL (ref 12.0–15.0)
Immature Granulocytes: 1 %
Lymphocytes Relative: 17 %
Lymphs Abs: 2.2 10*3/uL (ref 0.7–4.0)
MCH: 30.2 pg (ref 26.0–34.0)
MCHC: 33.1 g/dL (ref 30.0–36.0)
MCV: 91.4 fL (ref 80.0–100.0)
Monocytes Absolute: 1.2 10*3/uL — ABNORMAL HIGH (ref 0.1–1.0)
Monocytes Relative: 9 %
Neutro Abs: 9.2 10*3/uL — ABNORMAL HIGH (ref 1.7–7.7)
Neutrophils Relative %: 70 %
Platelets: 191 10*3/uL (ref 150–400)
RBC: 4.4 MIL/uL (ref 3.87–5.11)
RDW: 13.5 % (ref 11.5–15.5)
WBC: 13 10*3/uL — ABNORMAL HIGH (ref 4.0–10.5)
nRBC: 0 % (ref 0.0–0.2)

## 2022-01-14 LAB — HEPATIC FUNCTION PANEL
ALT: 48 U/L — ABNORMAL HIGH (ref 0–44)
AST: 64 U/L — ABNORMAL HIGH (ref 15–41)
Albumin: 2.6 g/dL — ABNORMAL LOW (ref 3.5–5.0)
Alkaline Phosphatase: 79 U/L (ref 38–126)
Bilirubin, Direct: 0.3 mg/dL — ABNORMAL HIGH (ref 0.0–0.2)
Indirect Bilirubin: 0.8 mg/dL (ref 0.3–0.9)
Total Bilirubin: 1.1 mg/dL (ref 0.3–1.2)
Total Protein: 7.2 g/dL (ref 6.5–8.1)

## 2022-01-14 LAB — BASIC METABOLIC PANEL
Anion gap: 13 (ref 5–15)
BUN: 8 mg/dL (ref 8–23)
CO2: 20 mmol/L — ABNORMAL LOW (ref 22–32)
Calcium: 9 mg/dL (ref 8.9–10.3)
Chloride: 102 mmol/L (ref 98–111)
Creatinine, Ser: 0.78 mg/dL (ref 0.44–1.00)
GFR, Estimated: >60 mL/min (ref >60)
Glucose, Bld: 93 mg/dL (ref 70–99)
Potassium: 3.5 mmol/L (ref 3.5–5.1)
Sodium: 135 mmol/L (ref 135–145)

## 2022-01-14 LAB — CBG MONITORING, ED: Glucose-Capillary: 80 mg/dL (ref 70–99)

## 2022-01-14 LAB — HIV ANTIBODY (ROUTINE TESTING W REFLEX): HIV Screen 4th Generation wRfx: NONREACTIVE

## 2022-01-14 LAB — TSH: TSH: 1.266 u[IU]/mL (ref 0.350–4.500)

## 2022-01-14 LAB — PHOSPHORUS: Phosphorus: 2.8 mg/dL (ref 2.5–4.6)

## 2022-01-14 LAB — LACTIC ACID, PLASMA: Lactic Acid, Venous: 1.8 mmol/L (ref 0.5–1.9)

## 2022-01-14 LAB — MAGNESIUM: Magnesium: 2 mg/dL (ref 1.7–2.4)

## 2022-01-14 LAB — CK: Total CK: 67 U/L (ref 38–234)

## 2022-01-14 MED ORDER — LEVOFLOXACIN IN D5W 500 MG/100ML IV SOLN
500.0000 mg | INTRAVENOUS | Status: DC
  Administered 2022-01-15: 500 mg via INTRAVENOUS
  Filled 2022-01-14: qty 100

## 2022-01-14 MED ORDER — PANTOPRAZOLE SODIUM 40 MG PO TBEC
40.0000 mg | DELAYED_RELEASE_TABLET | Freq: Every day | ORAL | Status: DC
  Administered 2022-01-15 – 2022-01-17 (×3): 40 mg via ORAL
  Filled 2022-01-14 (×3): qty 1

## 2022-01-14 MED ORDER — VANCOMYCIN HCL 1750 MG/350ML IV SOLN: 1750.0000 mg | INTRAVENOUS | Status: DC

## 2022-01-14 MED ORDER — DOXYCYCLINE HYCLATE 100 MG PO TABS
100.0000 mg | ORAL_TABLET | Freq: Once | ORAL | Status: AC
  Administered 2022-01-14: 100 mg via ORAL
  Filled 2022-01-14: qty 1

## 2022-01-14 MED ORDER — CEFAZOLIN SODIUM-DEXTROSE 2-4 GM/100ML-% IV SOLN
2.0000 g | Freq: Once | INTRAVENOUS | Status: AC
  Administered 2022-01-14: 2 g via INTRAVENOUS
  Filled 2022-01-14: qty 100

## 2022-01-14 MED ORDER — SODIUM CHLORIDE 0.9 % IV SOLN
2.0000 g | Freq: Three times a day (TID) | INTRAVENOUS | Status: DC
  Administered 2022-01-15 – 2022-01-17 (×7): 2 g via INTRAVENOUS
  Filled 2022-01-14 (×8): qty 12.5

## 2022-01-14 MED ORDER — ACETAMINOPHEN 650 MG RE SUPP: 650.0000 mg | Freq: Four times a day (QID) | RECTAL | Status: DC | PRN

## 2022-01-14 MED ORDER — ACETAMINOPHEN 325 MG PO TABS: 650.0000 mg | ORAL_TABLET | Freq: Four times a day (QID) | ORAL | Status: DC | PRN

## 2022-01-14 MED ORDER — HYDROCODONE-ACETAMINOPHEN 5-325 MG PO TABS
1.0000 | ORAL_TABLET | ORAL | Status: DC | PRN
  Administered 2022-01-14: 2 via ORAL
  Administered 2022-01-15: 1 via ORAL
  Administered 2022-01-15 – 2022-01-17 (×5): 2 via ORAL
  Filled 2022-01-14 (×2): qty 2
  Filled 2022-01-14: qty 1
  Filled 2022-01-14 (×4): qty 2

## 2022-01-14 MED ORDER — SODIUM CHLORIDE 0.9 % IV SOLN: INTRAVENOUS | Status: DC

## 2022-01-14 MED ORDER — VANCOMYCIN HCL 10 G IV SOLR
2500.0000 mg | Freq: Once | INTRAVENOUS | Status: AC
  Administered 2022-01-14: 2500 mg via INTRAVENOUS
  Filled 2022-01-14: qty 25

## 2022-01-14 NOTE — Assessment & Plan Note (Signed)
Follow-up as an outpatient nutrition

## 2022-01-14 NOTE — ED Provider Notes (Signed)
[facility] EMERGENCY DEPARTMENT Provider Note   CSN: [id number] Arrival date & time: 01/14/22  3748     History  Chief Complaint  Patient presents with   Leg Swelling    Lorraine Ryan is a 66 y.o. female.  HPI     66 year old female comes in with chief complaint of leg swelling. Patient has history of hypertension, bipolar disorder, questionable diabetes-patient is not on any antihyperglycemic's and she is homeless.  She states that she has had leg swelling for the last 2 days.  Swelling has worsened, redness has increased and her pain is also worsened.  Review of system is negative for any fevers or chills.  Patient is having difficulty with ambulation.  Of note, patient states that she is homeless.  She goes to a gym every day and uses a whirlpool.  She suspects that the cellulitis started after she was in a work for too long.  Home Medications Prior to Admission medications   Medication Sig Start Date End Date Taking? Authorizing Provider  KLOR-CON M20 20 MEQ tablet TAKE 1 TABLET BY MOUTH EVERY DAY Patient taking differently: Take 20 mEq by mouth every evening. 07/22/21  Yes Dugal, Tabitha, FNP  lisinopril (ZESTRIL) 20 MG tablet TAKE 1 TABLET BY MOUTH EVERY DAY Patient taking differently: Take 20 mg by mouth every evening. 06/25/21  Yes Dugal, Tabitha, FNP  metoprolol succinate (TOPROL XL) 25 MG 24 hr tablet Take 1 tablet (25 mg total) by mouth daily. Patient taking differently: Take 25 mg by mouth every evening. 06/01/21  Yes Dugal, Lawerance Bach, FNP  rosuvastatin (CRESTOR) 40 MG tablet TAKE 1 TABLET BY MOUTH DAILY Patient taking differently: Take 40 mg by mouth every evening. 10/05/21  Yes Dugal, Lawerance Bach, FNP  furosemide (LASIX) 20 MG tablet TAKE 1 TABLET BY MOUTH EVERY DAY Patient not taking: Reported on 11/28/2021 07/22/21   Eugenia Pancoast, FNP  gabapentin (NEURONTIN) 300 MG capsule TAKE 1 CAPSULE BY MOUTH 3 TIMES A DAY Patient not taking: Reported on  11/28/2021 08/30/21   Eugenia Pancoast, FNP  meloxicam (MOBIC) 15 MG tablet Take 1 tablet (15 mg total) by mouth daily. Patient not taking: Reported on 11/28/2021 08/30/21   Edrick Kins, DPM  omeprazole (PRILOSEC) 40 MG capsule TAKE 1 CAPSULE BY MOUTH DAILY Patient not taking: Reported on 01/14/2022 10/22/21   Eugenia Pancoast, FNP      Allergies    Codeine, Oxycontin [oxycodone], Prednisone, and Ultram [tramadol]    Review of Systems   Review of Systems  All other systems reviewed and are negative.   Physical Exam Updated Vital Signs BP (!) 120/57   Pulse 83   Temp 98 F (36.7 C) (Oral)   Resp (!) 21   Ht '5\' 6"'$  (1.676 m)   Wt 122.5 kg   SpO2 97%   BMI 43.58 kg/m  Physical Exam Vitals and nursing note reviewed.  Constitutional:      Appearance: She is well-developed.  HENT:     Head: Atraumatic.  Cardiovascular:     Rate and Rhythm: Normal rate.  Pulmonary:     Effort: Pulmonary effort is normal.  Musculoskeletal:        General: Swelling and tenderness present. No deformity.     Cervical back: Normal range of motion and neck supple.  Skin:    General: Skin is warm and dry.     Findings: Erythema present.  Neurological:     Mental Status: She is alert and oriented to person, place,  and time.        ED Results / Procedures / Treatments   Labs (all labs ordered are listed, but only abnormal results are displayed) Labs Reviewed  BASIC METABOLIC PANEL - Abnormal; Notable for the following components:      Result Value   CO2 20 (*)    All other components within normal limits  CBC WITH DIFFERENTIAL/PLATELET - Abnormal; Notable for the following components:   WBC 13.0 (*)    Neutro Abs 9.2 (*)    Monocytes Absolute 1.2 (*)    Abs Immature Granulocytes 0.12 (*)    All other components within normal limits  LACTIC ACID, PLASMA  CBG MONITORING, ED    EKG None  Radiology VAS [place] LOWER EXTREMITY VENOUS (DVT) (7a-7p)  Result Date: 01/14/2022  Lower Venous  DVT Study [name]  Date of Exam:   01/14/2022 Medical Rec #: [id number]        Accession #:    [id number] Date of Birth: 08-27-1955        Patient Gender: F Patient Age:   20 years Exam Location:  [facility] Procedure:      VAS [place] LOWER EXTREMITY VENOUS (DVT) Referring Phys: Dorise Bullion --------------------------------------------------------------------------------  Indications: Pain.  Limitations: Body habitus and poor ultrasound/tissue interface. Comparison Study: no prior Performing Technologist: Archie Patten RVS  Examination Guidelines: A complete evaluation includes B-mode imaging, spectral Doppler, color Doppler, and power Doppler as needed of all accessible portions of each vessel. Bilateral testing is considered an integral part of a complete examination. Limited examinations for reoccurring indications may be performed as noted. The reflux portion of the exam is performed with the patient in reverse Trendelenburg.  +--------+---------------+---------+-----------+----------+--------------------+ RIGHT   CompressibilityPhasicitySpontaneityPropertiesThrombus Aging       +--------+---------------+---------+-----------+----------+--------------------+ CFV     Full           Yes      Yes                                       +--------+---------------+---------+-----------+----------+--------------------+ SFJ     Full                                                              +--------+---------------+---------+-----------+----------+--------------------+ FV Prox Full                                                              +--------+---------------+---------+-----------+----------+--------------------+ FV Mid                 Yes      Yes                  patent by color  doppler              +--------+---------------+---------+-----------+----------+--------------------+  FV                     Yes      Yes                  patent by color      Distal                                               doppler              +--------+---------------+---------+-----------+----------+--------------------+ PFV     Full                                                              +--------+---------------+---------+-----------+----------+--------------------+ POP     Full           Yes      Yes                                       +--------+---------------+---------+-----------+----------+--------------------+ PTV                    Yes      Yes                  patent by color                                                           doppler              +--------+---------------+---------+-----------+----------+--------------------+ PERO                   Yes      Yes                  patent by color                                                           doppler              +--------+---------------+---------+-----------+----------+--------------------+   +----+---------------+---------+-----------+----------+--------------+ LEFTCompressibilityPhasicitySpontaneityPropertiesThrombus Aging +----+---------------+---------+-----------+----------+--------------+ CFV Full           Yes      Yes                                 +----+---------------+---------+-----------+----------+--------------+     Summary: RIGHT: - There is no evidence of deep vein thrombosis in the lower extremity. However, portions of this examination were limited- see technologist comments above.  - No cystic structure found in the popliteal fossa.  LEFT: -  No evidence of common femoral vein obstruction.  *See table(s) above for measurements and observations. Electronically signed by Monica Martinez MD on 01/14/2022 at 4:29:20 PM.    Final     Procedures Procedures    Medications Ordered in ED Medications - No data to display  ED Course/  Medical Decision Making/ A&P Clinical Course as of 01/14/22 1827  Mon Jan 14, 2022  1415 VAS [place] LOWER EXTREMITY VENOUS (DVT) (7a-7p) [DR]    Clinical Course User Index [DR] Pattricia Boss, MD                           Medical Decision Making 66 year old patient comes in with chief complaint of right-sided leg swelling.  She does not have any significant medical history, but she is homeless.  History was suggestive of exposure to hot tub every day, and patient suspects that she might have acquired the infection after using it.  On exam she has a weeping right lower extremity with blisters.  Ultrasound DVT was ordered, it is negative but unfortunately not an adequate study.  Low clinical suspicion for DVT however.  Repeat DVT study can be ordered if there is continued suspicion for DVT.  Differential diagnosis in addition to DVT included compartment syndrome, cellulitis, folliculitis.  With the use of hot tub, pseudomonal folliculitis is higher on the differential.  Patient is homeless.  Not a great candidate for [place] given the concerns for pseudomonal infection.  Plan is to put patient on antibiotics and admit her to the hospital.  Consulted with pharmacy.  They recommend cefepime plus Doxy, the latter to cover for MRSA.  Clinically not septic.  Results discussed with the patient.   Problems Addressed: Cellulitis of right lower extremity: complicated acute illness or injury  Amount and/or Complexity of Data Reviewed Radiology: ordered.  Risk Decision regarding hospitalization.    Final Clinical Impression(s) / ED Diagnoses Final diagnoses:  Cellulitis of right lower extremity    Rx / DC Orders ED Discharge Orders     None         Varney Biles, MD 01/14/22 1827

## 2022-01-14 NOTE — Progress Notes (Signed)
Pharmacy Antibiotic Note  Lorraine Ryan is a 66 y.o. female admitted on 01/14/2022 with RLE cellulitis with water exposure.  Pharmacy has been consulted for Cefepime, Vancomycin, and Levofloxacin dosing.  WBC 13, Tmax 100.3, LA 1.8 SCr 0.78  Pt received cefazolin 2g IV x1 and doxycycline '100mg'$  PO x1 in the ED on 12/11.   Plan: Start Cefepime 2g IV q8h  x7 days Start Levofloxacin '500mg'$  IV q24h x 7 days Initiate loading dose of Vancomycin '2500mg'$  IV x 1, followed by  Vancomycin '1750mg'$  IV q24h (eAUC ~480) for a total of 7 days    > Goal AUC 400-550    > Check vancomycin levels at steady state  Monitor daily CBC, temp, SCr, and for clinical signs of improvement  F/u cultures and de-escalate antibiotics as able   Height: '5\' 6"'$  (167.6 cm) Weight: 122.5 kg (270 lb) IBW/kg (Calculated) : 59.3  Temp (24hrs), Avg:98.6 F (37 C), Min:98 F (36.7 C), Max:99.1 F (37.3 C)  Recent Labs  Lab 01/14/22 1723 01/14/22 1733  WBC 13.0*  --   CREATININE 0.78  --   LATICACIDVEN  --  1.8    Estimated Creatinine Clearance: 92.4 mL/min (by C-G formula based on SCr of 0.78 mg/dL).    Allergies  Allergen Reactions   Codeine Other (See Comments)    Headache   Oxycontin [Oxycodone] Nausea Only and Palpitations   Prednisone Other (See Comments)    Headache   Ultram [Tramadol] Nausea Only    Antimicrobials this admission: Cefazolin 12/11 x1 Doxycycline 12/11 x1 Cefepime 12/11 >>  Levofloxacin 12/11 >>  Vancomycin 12/11 >>   Dose adjustments this admission: N/A  Microbiology results: None ordered at this time  Thank you for allowing pharmacy to be a part of this patient's care.  Luisa Hart, PharmD, BCPS Clinical Pharmacist 01/14/2022 8:18 PM   Please refer to [facility] for pharmacy phone number

## 2022-01-14 NOTE — Progress Notes (Signed)
Lower extremity venous has been completed.   Preliminary results in CV Proc.   Jinny Blossom Ajamu Maxon 01/14/2022 4:11 PM

## 2022-01-14 NOTE — Assessment & Plan Note (Signed)
-  admit per  cellulitis protocol will       Treat w cefepime vanc       plain films showed:   no evidence of air  no evidence of osteomyelitis   no    foreign   objects       Will obtain MRSA screening,   obtain blood cultures  if febrile or septic     further antibiotic adjustment pending above results

## 2022-01-14 NOTE — Subjective & Objective (Signed)
Right leg swelling for the past 2 days with some blistering, Pt is homeless, has been using a gym hot tub to soak her wound Now has been having trouble walking

## 2022-01-14 NOTE — ED Provider Triage Note (Signed)
Emergency Medicine Provider Triage Evaluation Note  Lorraine Ryan , a 66 y.o. female  was evaluated in triage.  Pt complains of progressive right lower leg swelling over the last 2 days since she was in a hot tub described as a "sonic boom".  Denies fever, though endorses chills.  Denies nausea, vomiting, or changes in urinary or bowel habits.  Applied a topical OTC ointment to help with the pain, unsure of the name.  Hx of DMT2, HTN, schizoaffective disorder.  Review of Systems  Positive:  Negative: See above  Physical Exam  BP 121/74 (BP Location: Right Arm)   Pulse 85   Temp 99.1 F (37.3 C) (Oral)   Resp 19   Ht '5\' 6"'$  (1.676 m)   Wt 122.5 kg   SpO2 97%   BMI 43.58 kg/m  Gen:   Awake, no distress   Resp:  Normal effort  MSK:   Moves extremities without difficulty  Other:  Sitting comfortably.  Sensation and blood flow intact to bilateral lower extremities.  DP and PT pulses 1+ to 2+ bilaterally.  See photos.    Medical Decision Making  Medically screening exam initiated at 11:03 AM.  Appropriate orders placed.  Lorraine Ryan was informed that the remainder of the evaluation will be completed by another provider, this initial triage assessment does not replace that evaluation, and the importance of remaining in the ED until their evaluation is complete.  Does not meet SIRS or sepsis criteria.   Prince Rome, PA-C 20/35/59 1111

## 2022-01-14 NOTE — Assessment & Plan Note (Signed)
Establish follow-up with psychiatry

## 2022-01-14 NOTE — ED Triage Notes (Addendum)
Pt arrived POV from home c/o right leg swelling x2 days. Pt states she got in a sonic boom and it did that to her.

## 2022-01-14 NOTE — ED Notes (Signed)
Vasc Tech called per Dr Andree Elk oral order.

## 2022-01-14 NOTE — Assessment & Plan Note (Signed)
Will need need transitional care consult

## 2022-01-14 NOTE — Assessment & Plan Note (Signed)
Transiently soft blood pressures in the ED will allow permissive hypertension for tonight and resume meds as able

## 2022-01-14 NOTE — Assessment & Plan Note (Signed)
Unclear hx, check lipid panel

## 2022-01-14 NOTE — Assessment & Plan Note (Signed)
Isolated event, will continue to follow, if recurs can do additional testing

## 2022-01-14 NOTE — H&P (Addendum)
Lorraine Ryan WCH:[id number] DOB: 08-01-1955 DOA: 01/14/2022   PCP: Eugenia Pancoast, FNP   Outpatient Specialists:  NONE           Patient arrived to ER on 01/14/22 at 0954 Referred by Attending Varney Biles, MD   Patient coming from:   homeless  Chief Complaint:   Chief Complaint  Patient presents with   Leg Swelling    HPI: Lorraine Ryan is a 66 y.o. female with medical history significant of HTN, Stroke, Dementia, homelessness, , bipolar disorder, schizoaffective disorder     Presented with  right leg pain swelling, blistering Right leg swelling for the past 2 days with some blistering, Pt is homeless, has been using a gym hot tub to soak her wound Now has been having trouble walking   Patient endorses not feeling well for past 24 hours have been having a little bit of diarrhea after she ate the primary.  Has overall chills and feeling unwell.  She does not smoke or drink Has been taking metoprolol only for her blood pressure denies history of diabetes Also reports a little bit of left-sided facial pain although there is no redness or swelling.    Regarding pertinent Chronic problems:     HTN on Lisinopril, metoprolol lasix    Morbid obesity-   BMI Readings from Last 1 Encounters:  01/14/22 43.58 kg/m      Hx of CVA -  with/out residual deficits      While in ER: Clinical Course as of 01/14/22 1905  Mon Jan 14, 2022  1415 VAS [place] LOWER EXTREMITY VENOUS (DVT) (7a-7p) [DR]    Clinical Course User Index [DR] Pattricia Boss, MD   Started on cefazolin and doxycycline   Plain images showed Mild-to-moderate medial compartment and patellofemoral compartment osteoarthritis.  Following Medications were ordered in ER: Medications  ceFAZolin (ANCEF) IVPB 2g/100 mL premix (2 g Intravenous New Bag/Given 01/14/22 1902)  doxycycline (VIBRA-TABS) tablet 100 mg (100 mg Oral Given 01/14/22 1900)       ED Triage Vitals  Enc Vitals Group     BP 01/14/22 1028  121/74     Pulse Rate 01/14/22 1028 85     Resp 01/14/22 1028 19     Temp 01/14/22 1028 99.1 F (37.3 C)     Temp Source 01/14/22 1028 Oral     SpO2 01/14/22 1028 97 %     Weight 01/14/22 1038 270 lb (122.5 kg)     Height 01/14/22 1038 '5\' 6"'$  (1.676 m)     Head Circumference --      Peak Flow --      Pain Score 01/14/22 1038 10     Pain Loc --      Pain Edu? --      Excl. in [place]? --   TMAX(24)@     _________________________________________ Significant initial  Findings: Abnormal Labs Reviewed  BASIC METABOLIC PANEL - Abnormal; Notable for the following components:      Result Value   CO2 20 (*)    All other components within normal limits  CBC WITH DIFFERENTIAL/PLATELET - Abnormal; Notable for the following components:   WBC 13.0 (*)    Neutro Abs 9.2 (*)    Monocytes Absolute 1.2 (*)    Abs Immature Granulocytes 0.12 (*)    All other components within normal limits       ECG: Ordered    The recent clinical data is shown below. Vitals:   01/14/22 1549 01/14/22 1630  01/14/22 1645 01/14/22 1730  BP: 131/76 98/78 122/85 (!) 120/57  Pulse: 78 81 84 83  Resp: 17 20 (!) 22 (!) 21  Temp: 98 F (36.7 C)     TempSrc: Oral     SpO2: 98% 99% 98% 97%  Weight:      Height:        WBC     Component Value Date/Time   WBC 13.0 (H) 01/14/2022 1723   LYMPHSABS 2.2 01/14/2022 1723   MONOABS 1.2 (H) 01/14/2022 1723   EOSABS 0.2 01/14/2022 1723   BASOSABS 0.1 01/14/2022 1723    Lactic Acid, Venous    Component Value Date/Time   LATICACIDVEN 1.8 01/14/2022 1733     UA ordered    Results for orders placed or performed in visit on 06/21/21  Urine Culture     Status: None   Collection Time: 06/21/21 11:52 AM   Specimen: Blood  Result Value Ref Range Status   MICRO NUMBER: [id number]  Final   SPECIMEN QUALITY: Adequate  Final   Sample Source NOT GIVEN  Final   STATUS: FINAL  Final   Result: No Growth  Final    _______________________________________________ Hospitalist  was called for admission for   Cellulitis of right lower extremity    The following Work up has been ordered so far:  Orders Placed This Encounter  Procedures   DG Tibia/Fibula Right   Basic metabolic panel   CBC with Differential/Platelet   Lactic acid   Initiate Carrier Fluid Protocol   Consult to hospitalist   levofloxacin ([place]) per pharmacy consult   CBG monitoring, ED   VAS [place] LOWER EXTREMITY VENOUS (DVT) (7a-7p)     OTHER Significant initial  Findings:  labs showing:   Recent Labs  Lab 01/14/22 1723  NA 135  K 3.5  CO2 20*  GLUCOSE 93  BUN 8  CREATININE 0.78  CALCIUM 9.0    Cr  stable,    Lab Results  Component Value Date   CREATININE 0.78 01/14/2022   CREATININE 0.74 11/28/2021   CREATININE 0.81 06/21/2021    Lab Results  Component Value Date   CALCIUM 9.0 01/14/2022    Plt: Lab Results  Component Value Date   PLT 191 01/14/2022        Recent Labs  Lab 01/14/22 1723  WBC 13.0*  NEUTROABS 9.2*  HGB 13.3  HCT 40.2  MCV 91.4  PLT 191    HG/HCT   stable     Component Value Date/Time   HGB 13.3 01/14/2022 1723   HGB 13.6 06/15/2018 1032   HCT 40.2 01/14/2022 1723   HCT 39.8 06/15/2018 1032   MCV 91.4 01/14/2022 1723   MCV 87 06/15/2018 1032    BNP (last 3 results) Recent Labs    04/16/21 2056 11/28/21 1315  BNP 15.4 13.5      DM  labs:  HbA1C: Recent Labs    07/03/21 1516  HGBA1C 5.7*       CBG (last 3)  Recent Labs    01/14/22 1554  GLUCAP 80          Cultures:    Component Value Date/Time   SDES URINE, CLEAN CATCH 12/19/2018 1609   [place] 12/19/2018 1609   CULT  12/19/2018 1609    NO GROWTH Performed at [facility], [place] [address], [place], [place] [address]    REPTSTATUS 12/20/2018 FINAL 12/19/2018 1609     Radiological Exams on Admission: DG Tibia/Fibula  Right  Result Date: 01/14/2022 CLINICAL DATA:  Right leg swelling for 2 days. EXAM: RIGHT TIBIA AND FIBULA - 2 VIEW  COMPARISON:  Correlation with right foot radiographs 08/29/2021 FINDINGS: Mildly decreased bone mineralization. Mild-to-moderate medial compartment of the knee joint space narrowing and peripheral osteophytosis. Mild lateral compartment of the knee peripheral osteophytosis. Mild-to-moderate patellofemoral joint space narrowing. Mild superior and inferior patellar degenerative osteophytes. No knee joint effusion. No acute fracture or dislocation. Moderate enthesopathic spurring at the Achilles insertion on the calcaneus. Moderate dorsal talonavicular degenerative osteophytosis. IMPRESSION: Mild-to-moderate medial compartment and patellofemoral compartment osteoarthritis. Electronically Signed   By: Yvonne Kendall M.D.   On: 01/14/2022 18:30   VAS [place] LOWER EXTREMITY VENOUS (DVT) (7a-7p)  Result Date: 01/14/2022  Lower Venous DVT Study Patient Name:  EDIE VALLANDINGHAM  Date of Exam:   01/14/2022 Medical Rec #: [id number]        Accession #:    [id number] Date of Birth: 01-07-1956        Patient Gender: F Patient Age:   70 years Exam Location:  [facility] Procedure:      VAS [place] LOWER EXTREMITY VENOUS (DVT) Referring Phys: Dorise Bullion --------------------------------------------------------------------------------  Indications: Pain.  Limitations: Body habitus and poor ultrasound/tissue interface. Comparison Study: no prior Performing Technologist: Archie Patten RVS  Examination Guidelines: A complete evaluation includes B-mode imaging, spectral Doppler, color Doppler, and power Doppler as needed of all accessible portions of each vessel. Bilateral testing is considered an integral part of a complete examination. Limited examinations for reoccurring indications may be performed as noted. The reflux portion of the exam is performed with the patient in reverse Trendelenburg.  +--------+---------------+---------+-----------+----------+--------------------+ RIGHT    CompressibilityPhasicitySpontaneityPropertiesThrombus Aging       +--------+---------------+---------+-----------+----------+--------------------+ CFV     Full           Yes      Yes                                       +--------+---------------+---------+-----------+----------+--------------------+ SFJ     Full                                                              +--------+---------------+---------+-----------+----------+--------------------+ FV Prox Full                                                              +--------+---------------+---------+-----------+----------+--------------------+ FV Mid                 Yes      Yes                  patent by color                                                           doppler              +--------+---------------+---------+-----------+----------+--------------------+  FV                     Yes      Yes                  patent by color      Distal                                               doppler              +--------+---------------+---------+-----------+----------+--------------------+ PFV     Full                                                              +--------+---------------+---------+-----------+----------+--------------------+ POP     Full           Yes      Yes                                       +--------+---------------+---------+-----------+----------+--------------------+ PTV                    Yes      Yes                  patent by color                                                           doppler              +--------+---------------+---------+-----------+----------+--------------------+ PERO                   Yes      Yes                  patent by color                                                           doppler              +--------+---------------+---------+-----------+----------+--------------------+    +----+---------------+---------+-----------+----------+--------------+ LEFTCompressibilityPhasicitySpontaneityPropertiesThrombus Aging +----+---------------+---------+-----------+----------+--------------+ CFV Full           Yes      Yes                                 +----+---------------+---------+-----------+----------+--------------+     Summary: RIGHT: - There is no evidence of deep vein thrombosis in the lower extremity. However, portions of this examination were limited- see technologist comments above.  - No cystic structure found in the popliteal fossa.  LEFT: - No evidence of common femoral vein obstruction.  *See table(s) above for measurements and  observations. Electronically signed by Monica Martinez MD on 01/14/2022 at 4:29:20 PM.    Final    _______________________________________________________________________________________________________ Latest  Blood pressure (!) 120/57, pulse 83, temperature 98 F (36.7 C), temperature source Oral, resp. rate (!) 21, height '5\' 6"'$  (1.676 m), weight 122.5 kg, SpO2 97 %.   Vitals  labs and radiology finding personally reviewed  Review of Systems:    Pertinent positives include: chills, fatigue, lower extremity swelling right more that left  Constitutional:  No weight loss, night sweats, Fevers,weight loss  HEENT:  No headaches, Difficulty swallowing,Tooth/dental problems,Sore throat,  No sneezing, itching, ear ache, nasal congestion, post nasal drip,  Cardio-vascular:  No chest pain, Orthopnea, PND, anasarca, dizziness, palpitations.no Bilateral  GI:  No heartburn, indigestion, abdominal pain, nausea, vomiting, diarrhea, change in bowel habits, loss of appetite, melena, blood in stool, hematemesis Resp:  no shortness of breath at rest. No dyspnea on exertion, No excess mucus, no productive cough, No non-productive cough, No coughing up of blood.No change in color of mucus.No wheezing. Skin:  no rash or lesions. No  jaundice GU:  no dysuria, change in color of urine, no urgency or frequency. No straining to urinate.  No flank pain.  Musculoskeletal:  No joint pain or no joint swelling. No decreased range of motion. No back pain.  Psych:  No change in mood or affect. No depression or anxiety. No memory loss.  Neuro: no localizing neurological complaints, no tingling, no weakness, no double vision, no gait abnormality, no slurred speech, no confusion  All systems reviewed and apart from [employer] all are negative _______________________________________________________________________________________________ Past Medical History:   Past Medical History:  Diagnosis Date   Anxiety    Arthritis    Back pain    Bipolar disorder ([place])    Chronic headaches    Dementia (HCC)    Depression    Diabetes mellitus without complication (HCC)    GERD (gastroesophageal reflux disease)    Hypertension    No meds prescribed; states intermittent   Neuromuscular disorder (HCC)    Schizoaffective psychosis ([place]) 06/27/2020   Shortness of breath dyspnea    Stroke ([place])    caused numbness in leg      Past Surgical History:  Procedure Laterality Date   ABDOMINAL HYSTERECTOMY     no cervix   COLONOSCOPY WITH PROPOFOL N/A 04/18/2015   OVF:IEPPIRJJ hemorrhoids/diverticulosis sigmoid colon/   ESOPHAGOGASTRODUODENOSCOPY (EGD) WITH PROPOFOL N/A 04/18/2015   SLF:web in the proximal esophagus/dilated/   GANGLION CYST EXCISION     LUMBAR LAMINECTOMY/DECOMPRESSION MICRODISCECTOMY N/A 02/08/2015   Procedure: L4-5 Decompression;  Surgeon: Marybelle Killings, MD;  Location: [facility];  Service: Orthopedics;  Laterality: N/A;   POLYPECTOMY  04/18/2015   Procedure: POLYPECTOMY;  Surgeon: Danie Binder, MD;  [facility];  Service: Endoscopy;;  ascending colon polyp   TONSILLECTOMY      Social History:  Ambulatory   independently     reports that she quit smoking about 36 years ago. Her smoking use included cigarettes. She  has a 5.00 pack-year smoking history. She has never used smokeless tobacco. She reports that she does not drink alcohol and does not use drugs.   Family History:  Family History  Problem Relation Age of Onset   Diabetes Mother    Hypertension Mother    Heart disease Other        No family history   Breast cancer Sister        diagnosed in her 19's  Anxiety disorder Sister    Depression Sister    Anxiety disorder Brother    Depression Brother    Anxiety disorder Brother    Depression Brother    Cancer Maternal Grandmother        unknown type   Colon cancer Neg Hx    _________________________________ Allergies: Allergies  Allergen Reactions   Codeine Other (See Comments)    Headache   Oxycontin [Oxycodone] Nausea Only and Palpitations   Prednisone Other (See Comments)    Headache   Ultram [Tramadol] Nausea Only     Prior to Admission medications   Medication Sig Start Date End Date Taking? Authorizing Provider  KLOR-CON M20 20 MEQ tablet TAKE 1 TABLET BY MOUTH EVERY DAY Patient taking differently: Take 20 mEq by mouth every evening. 07/22/21  Yes Dugal, Tabitha, FNP  lisinopril (ZESTRIL) 20 MG tablet TAKE 1 TABLET BY MOUTH EVERY DAY Patient taking differently: Take 20 mg by mouth every evening. 06/25/21  Yes Dugal, Tabitha, FNP  metoprolol succinate (TOPROL XL) 25 MG 24 hr tablet Take 1 tablet (25 mg total) by mouth daily. Patient taking differently: Take 25 mg by mouth every evening. 06/01/21  Yes Dugal, Lawerance Bach, FNP  rosuvastatin (CRESTOR) 40 MG tablet TAKE 1 TABLET BY MOUTH DAILY Patient taking differently: Take 40 mg by mouth every evening. 10/05/21  Yes Dugal, Lawerance Bach, FNP  furosemide (LASIX) 20 MG tablet TAKE 1 TABLET BY MOUTH EVERY DAY Patient not taking: Reported on 11/28/2021 07/22/21   Eugenia Pancoast, FNP  gabapentin (NEURONTIN) 300 MG capsule TAKE 1 CAPSULE BY MOUTH 3 TIMES A DAY Patient not taking: Reported on 11/28/2021 08/30/21   Eugenia Pancoast, FNP  meloxicam  (MOBIC) 15 MG tablet Take 1 tablet (15 mg total) by mouth daily. Patient not taking: Reported on 11/28/2021 08/30/21   Edrick Kins, DPM  omeprazole (PRILOSEC) 40 MG capsule TAKE 1 CAPSULE BY MOUTH DAILY Patient not taking: Reported on 01/14/2022 10/22/21   Eugenia Pancoast, FNP    ___________________________________________________________________________________________________ Physical Exam:    01/14/2022    5:30 PM 01/14/2022    4:45 PM 01/14/2022    4:30 PM  Vitals with BMI  Systolic 818 563 98  Diastolic 57 85 78  Pulse 83 84 81    1. General:  in No  Acute distress   Chronically ill   -appearing 2. Psychological: Alert and   Oriented 3. Head/ENT:   Dry Mucous Membranes                          Head Non traumatic, neck supple                          Poor Dentition 4. SKIN: decreased Skin turgor,  Skin clean Dry right leg warm and swollen     5. Heart: Regular rate and rhythm no  Murmur, no Rub or gallop 6. Lungs: no wheezes or crackles   7. Abdomen: Soft,  non-tender, Non distended   obese  bowel sounds present 8. Lower extremities: no clubbing, cyanosis,   edema R>L  9. Neurologically Grossly intact, moving all 4 extremities equally   10. MSK: Normal range of motion    Chart has been reviewed  ________  Assessment/Plan 66 y.o. female with medical history significant of HTN, Stroke, Dementia, homelessness, , bipolar disorder, schizoaffective disorder,    Admitted for   Cellulitis of right lower extremity  Present on Admission:  Cellulitis  Schizoaffective psychosis ([place])  Essential hypertension  Obesity, Class III, BMI 40-49.9 (morbid obesity) (HCC)  Diarrhea   Cellulitis -admit per  cellulitis protocol will       Treat w cefepime vanc       plain films showed:   no evidence of air  no evidence of osteomyelitis   no    foreign   objects       Will obtain MRSA screening,   obtain blood cultures  if febrile or septic     further antibiotic adjustment  pending above results   History of stroke Unclear hx, check lipid panel  Schizoaffective psychosis ([place]) Establish follow-up with psychiatry  Essential hypertension Transiently soft blood pressures in the ED will allow permissive hypertension for tonight and resume meds as able  Homeless single person Will need need transitional care consult  Obesity, Class III, BMI 40-49.9 (morbid obesity) ([place]) Follow-up as an outpatient nutrition  Diarrhea Isolated event, will continue to follow, if recurs can do additional testing   Other plan as per orders.  DVT prophylaxis:  SCD    Code Status:    Code Status: Prior FULL CODE as per patient   I had personally discussed CODE STATUS with patient   Family Communication:   Family not at  Bedside    Disposition Plan:         To home once workup is complete and patient is stable   Following barriers for discharge:                              Pain controlled with PO medications                             white count improving able to transition to PO antibiotics                             Will need to be able to tolerate PO                      Would benefit from PT/OT eval prior to DC  Ordered                                      Transition of care consulted                   Nutrition    consulted                Consults called: none  Admission status:  ED Disposition     ED Disposition  Admit   Condition  --   [place]: [place] [[id number]]  Level of Care: Med-Surg [16]  May admit patient to [facility] or [facility] if equivalent level of care is available:: No  Covid Evaluation: Asymptomatic - no recent exposure (last 10 days) testing not required  Diagnosis: Cellulitis [[id number]]  Admitting Physician: Toy Baker [[id number]]  Attending Physician: Toy Baker [[id number]]  Certification:: I certify this patient will need inpatient services for at least 2 midnights  Estimated Length of  Stay: 2            inpatient     I Expect 2 midnight  stay secondary to severity of patient's current illness need for inpatient interventions justified by the following:    Severe lab/radiological/exam abnormalities including:     Right leg cellulitis  That are currently affecting medical management.   I expect  patient to be hospitalized for 2 midnights requiring inpatient medical care.  Patient is at high risk for adverse outcome (such as loss of life or disability) if not treated.  Indication for inpatient stay as follows:     Need for IV antibiotics, IV fluids,    Level of care     medical floor      Toy Baker 01/14/2022, 7:43 PM    [facility]     after 2 AM please page floor coverage PA If 7AM-7PM, please contact the day team taking care of the patient using [email or web address]   Patient was evaluated in the context of the global COVID-19 pandemic, which necessitated consideration that the patient might be at risk for infection with the SARS-CoV-2 virus that causes COVID-19. Institutional protocols and algorithms that pertain to the evaluation of patients at risk for COVID-19 are in a state of rapid change based on information released by regulatory bodies including the CDC and federal and state organizations. These policies and algorithms were followed during the patient's care.

## 2022-01-15 DIAGNOSIS — L03115 Cellulitis of right lower limb: Secondary | ICD-10-CM | POA: Diagnosis not present

## 2022-01-15 LAB — COMPREHENSIVE METABOLIC PANEL
ALT: 42 U/L (ref 0–44)
AST: 50 U/L — ABNORMAL HIGH (ref 15–41)
Albumin: 2.4 g/dL — ABNORMAL LOW (ref 3.5–5.0)
Alkaline Phosphatase: 70 U/L (ref 38–126)
Anion gap: 10 (ref 5–15)
BUN: 5 mg/dL — ABNORMAL LOW (ref 8–23)
CO2: 24 mmol/L (ref 22–32)
Calcium: 8.4 mg/dL — ABNORMAL LOW (ref 8.9–10.3)
Chloride: 103 mmol/L (ref 98–111)
Creatinine, Ser: 0.78 mg/dL (ref 0.44–1.00)
GFR, Estimated: >60 mL/min (ref >60)
Glucose, Bld: 106 mg/dL — ABNORMAL HIGH (ref 70–99)
Potassium: 2.5 mmol/L — CL (ref 3.5–5.1)
Sodium: 137 mmol/L (ref 135–145)
Total Bilirubin: 1.4 mg/dL — ABNORMAL HIGH (ref 0.3–1.2)
Total Protein: 6.3 g/dL — ABNORMAL LOW (ref 6.5–8.1)

## 2022-01-15 LAB — URINALYSIS, COMPLETE (UACMP) WITH MICROSCOPIC
Bilirubin Urine: NEGATIVE
Glucose, UA: NEGATIVE mg/dL
Ketones, ur: NEGATIVE mg/dL
Leukocytes,Ua: NEGATIVE
Nitrite: NEGATIVE
Protein, ur: 30 mg/dL — AB
Specific Gravity, Urine: 1.013 (ref 1.005–1.030)
pH: 6 (ref 5.0–8.0)

## 2022-01-15 LAB — LIPID PANEL
Cholesterol: 117 mg/dL (ref 0–200)
HDL: 47 mg/dL (ref >40)
LDL Cholesterol: 61 mg/dL (ref 0–99)
Total CHOL/HDL Ratio: 2.5 RATIO
Triglycerides: 43 mg/dL (ref <150)
VLDL: 9 mg/dL (ref 0–40)

## 2022-01-15 LAB — CBC
HCT: 31.6 % — ABNORMAL LOW (ref 36.0–46.0)
Hemoglobin: 11.1 g/dL — ABNORMAL LOW (ref 12.0–15.0)
MCH: 30.3 pg (ref 26.0–34.0)
MCHC: 35.1 g/dL (ref 30.0–36.0)
MCV: 86.3 fL (ref 80.0–100.0)
Platelets: 217 10*3/uL (ref 150–400)
RBC: 3.66 MIL/uL — ABNORMAL LOW (ref 3.87–5.11)
RDW: 13.3 % (ref 11.5–15.5)
WBC: 11.9 10*3/uL — ABNORMAL HIGH (ref 4.0–10.5)
nRBC: 0 % (ref 0.0–0.2)

## 2022-01-15 LAB — MAGNESIUM: Magnesium: 1.9 mg/dL (ref 1.7–2.4)

## 2022-01-15 LAB — PREALBUMIN: Prealbumin: 9 mg/dL — ABNORMAL LOW (ref 18–38)

## 2022-01-15 LAB — PHOSPHORUS: Phosphorus: 2.8 mg/dL (ref 2.5–4.6)

## 2022-01-15 MED ORDER — METOPROLOL SUCCINATE ER 25 MG PO TB24
25.0000 mg | ORAL_TABLET | Freq: Every evening | ORAL | Status: DC
  Administered 2022-01-15: 25 mg via ORAL
  Filled 2022-01-15 (×2): qty 1

## 2022-01-15 MED ORDER — ROSUVASTATIN CALCIUM 20 MG PO TABS
40.0000 mg | ORAL_TABLET | Freq: Every evening | ORAL | Status: DC
  Administered 2022-01-15 – 2022-01-16 (×2): 40 mg via ORAL
  Filled 2022-01-15 (×2): qty 2

## 2022-01-15 MED ORDER — POTASSIUM CHLORIDE CRYS ER 20 MEQ PO TBCR
40.0000 meq | EXTENDED_RELEASE_TABLET | ORAL | Status: AC
  Administered 2022-01-15 (×2): 40 meq via ORAL
  Filled 2022-01-15 (×2): qty 2

## 2022-01-15 MED ORDER — POTASSIUM CHLORIDE 10 MEQ/100ML IV SOLN
10.0000 meq | INTRAVENOUS | Status: AC
  Administered 2022-01-15 (×3): 10 meq via INTRAVENOUS
  Filled 2022-01-15 (×3): qty 100

## 2022-01-15 MED ORDER — FUROSEMIDE 20 MG PO TABS
20.0000 mg | ORAL_TABLET | Freq: Every day | ORAL | Status: DC
  Administered 2022-01-15 – 2022-01-17 (×3): 20 mg via ORAL
  Filled 2022-01-15 (×3): qty 1

## 2022-01-15 NOTE — Plan of Care (Signed)

## 2022-01-15 NOTE — Assessment & Plan Note (Signed)
Not on aspirin - Continue atorvastatin

## 2022-01-15 NOTE — Assessment & Plan Note (Addendum)
bMI 43

## 2022-01-15 NOTE — Assessment & Plan Note (Signed)
-   Continue cefepime - Stop Levaquin and vancomycin - Obtain swab of wound to evaluate for pseudomonas, if none, will narrow to first gen cephalosporin

## 2022-01-15 NOTE — Assessment & Plan Note (Addendum)
Not on active treatment. No evidence of psychosis, delusions, hallucinations, disorganized thinking.

## 2022-01-15 NOTE — Progress Notes (Signed)
Initial Nutrition Assessment  DOCUMENTATION CODES:   Morbid obesity  INTERVENTION:  - Continue current diet order.   NUTRITION DIAGNOSIS:   No nutrition diagnosis at this time.  REASON FOR ASSESSMENT:   Consult Assessment of nutrition requirement/status  ASSESSMENT:   66 y.o. female admits related to leg swelling. PMH includes: HTN, Stroke, dementia, schizoaffective disorder. Pt is currently receiving medical management for cellulitis of right lower extremity.  Meds reviewed. Labs reviewed: K low.   The pt reports that she has been eating well since admission and has a good appetite. She also states that she was eating well PTA. Wt stable per record. RD will continue to monitor PO intakes.   NUTRITION - FOCUSED PHYSICAL EXAM:  WNL.  Diet Order:   Diet Order             Diet Heart Room service appropriate? Yes; Fluid consistency: Thin  Diet effective now                   EDUCATION NEEDS:      Skin:  Skin Assessment: Reviewed RN Assessment  Last BM:  01/14/22  Height:   Ht Readings from Last 1 Encounters:  01/14/22 '5\' 6"'$  (1.676 m)    Weight:   Wt Readings from Last 1 Encounters:  01/14/22 122.5 kg    Ideal Body Weight:     BMI:  Body mass index is 43.58 kg/m.  Estimated Nutritional Needs:   Kcal:  0034-9179 kcals  Protein:  90-120 gm  Fluid:  >/= 1.8 L  Thalia Bloodgood, RD, LDN, CNSC.

## 2022-01-15 NOTE — Progress Notes (Signed)
  Progress Note   Patient: Lorraine Ryan OIZ:[id number] DOB: 16-Nov-1955 DOA: 01/14/2022     1 DOS: the patient was seen and examined on 01/15/2022 at 10:50AM      Brief hospital course: Elanna Bert is a 66 y.o. female with medical history significant of HTN, Stroke, Dementia, homelessness, , bipolar disorder, schizoaffective disorder      Presented with  right leg pain swelling, blistering Right leg swelling for the past 2 days with some blistering, Pt is homeless, has been using a gym hot tub to soak her wound Now has been having trouble walking   Patient endorses not feeling well for past 24 hours have been having a little bit of diarrhea after she ate the primary.  Has overall chills and feeling unwell.  She does not smoke or drink Has been taking metoprolol only for her blood pressure denies history of diabetes Also reports a little bit of left-sided facial pain although there is no redness or swelling.       Assessment and Plan: * Cellulitis - Continue cefepime - Stop Levaquin and vancomycin - Obtain swab of wound to evaluate for pseudomonas, if none, will narrow to first gen cephalosporin  Diarrhea resolved  Obesity, Class III, BMI 40-49.9 (morbid obesity) ([place]) bMI 43  Homeless single person     Essential hypertension BP normal - Resume furosemide and metoprolol - Hold lisionpril  Schizoaffective psychosis ([place]) Not on active treatment. No evidence of psychosis, delusions, hallucinations, disorganized thinking.  History of stroke Not on aspirin - Continue atorvastatin          Subjective: No fever, no confusion.  No respiratory distress.     Physical Exam: BP 118/74 (BP Location: Left Arm)   Pulse 91   Temp 97.7 F (36.5 C) (Oral)   Resp 18   Ht '5\' 6"'$  (1.676 m) Comment: pt stated  Wt 122.5 kg Comment: pt stated  SpO2 99%   BMI 43.58 kg/m   Obese adult female, lying in bed, no acute distress RRR, no murmurs, no peripheral  edema Respiratory normal, clear without rales or wheezes Abdomen soft.  Patient guarding, no ascites or distention The right leg is very red and swollen and draining  Data Reviewed: Prealbumin level, HIV and TSH normal Lipids normal White blood cell count slightly improved She has hypokalemia    Family Communication: None present    Disposition: Status is: Inpatient         Author: Edwin Dada, MD 01/15/2022 2:48 PM  For on call review [email or web address].

## 2022-01-15 NOTE — Evaluation (Signed)
Physical Therapy Evaluation Patient Details Name: Lorraine Ryan MRN: [id number] DOB: [birth year] Today's Date: 01/15/2022  History of Present Illness  Lorraine Ryan is a 66 y.o. female who Presented with right leg pain swelling, blistering.  Admitted with RLE cellulitis. Past medical history significant of HTN, Stroke, Dementia, homelessness, bipolar disorder, schizoaffective disorder  Clinical Impression  Patient up in room returning from bathroom upon PT entry.  Noted one LOB with pt barefoot on R and with weeping from her leg so encouraged to use slipper socks for trips to the bathroom to avoid slipping.  Also discussed sleeping with her feet up (as reports sleeps in her car at times) to avoid dependency for prolonged periods.  She mobilized in hallway without device and no LOB while wearing slipper socks.  Feel she is close to baseline and without further skilled PT needs.  PT will sign off.        Recommendations for follow up therapy are one component of a multi-disciplinary discharge planning process, led by the attending physician.  Recommendations may be updated based on patient status, additional functional criteria and insurance authorization.  Follow Up Recommendations No PT follow up      Assistance Recommended at Discharge PRN  Patient can return home with the following       Equipment Recommendations None recommended by PT  Recommendations for Other Services       Functional Status Assessment Patient has not had a recent decline in their functional status     Precautions / Restrictions Precautions Precautions: Fall      Mobility  Bed Mobility               General bed mobility comments: up in chair    Transfers Overall transfer level: Modified independent Equipment used: None                    Ambulation/Gait Ambulation/Gait assistance: Independent Gait Distance (Feet): 200 Feet Assistive device: None Gait Pattern/deviations:  Step-through pattern, Antalgic, Decreased stance time - right       General Gait Details: more antalgic with in room ambulation, improved with hallway ambulation, pt not wearing slipper sock on R foot and walking back from bathroom to chair as PT entered, noted some LOB on R foot when leaning to get to chair and pt with weeping leg and moisture on her foot so educated to wear slipper sock for trips to bathroom for safety  Stairs            Wheelchair Mobility    Modified Rankin (Stroke Patients Only)       Balance Overall balance assessment: Needs assistance   Sitting balance-Leahy Scale: Good     Standing balance support: No upper extremity supported Standing balance-Leahy Scale: Good Standing balance comment: no LOB for hallway ambulation                             Pertinent Vitals/Pain Pain Assessment Pain Score: 8  Pain Location: R leg; but reported improved with ambulation Pain Descriptors / Indicators: Discomfort Pain Intervention(s): Monitored during session, Repositioned    Home Living Family/patient expects to be discharged to:: Shelter/Homeless Living Arrangements: Alone                 Additional Comments: Pt is guarded with information. Denied staying in shelters and nodded to tent and sleeping bag. Pt reporting, "I just stay out of people's way."  Prior Function Prior Level of Function : Independent/Modified Independent                     Hand Dominance        Extremity/Trunk Assessment   Upper Extremity Assessment Upper Extremity Assessment: Defer to OT evaluation    Lower Extremity Assessment Lower Extremity Assessment: RLE deficits/detail RLE Deficits / Details: edema and errythema and weeping on lower leg; lifts from hip with help with leg extended, able to lift with knee flexed, reported feeling heavy and numb, but both feet with decreased sensation equal per report RLE Sensation: decreased light touch        Communication   Communication: No difficulties  Cognition Arousal/Alertness: Awake/alert Behavior During Therapy: WFL for tasks assessed/performed Overall Cognitive Status: Within Functional Limits for tasks assessed                                          General Comments General comments (skin integrity, edema, etc.): Educated to avoid submersion of leg in water while trying to heal this infection and for caution in future. Reports sleeps in her car and educated to try to elevate her legs when sleeping to avoid swelling with dependency.    Exercises General Exercises - Lower Extremity Ankle Circles/Pumps: AROM, 10 reps, Seated   Assessment/Plan    PT Assessment Patient does not need any further PT services  PT Problem List         PT Treatment Interventions      PT Goals (Current goals can be found in the Care Plan section)  Acute Rehab PT Goals PT Goal Formulation: All assessment and education complete, DC therapy    Frequency       Co-evaluation               AM-PAC PT "6 Clicks" Mobility  Outcome Measure Help needed turning from your back to your side while in a flat bed without using bedrails?: None Help needed moving from lying on your back to sitting on the side of a flat bed without using bedrails?: None Help needed moving to and from a bed to a chair (including a wheelchair)?: None Help needed standing up from a chair using your arms (e.g., wheelchair or bedside chair)?: None Help needed to walk in hospital room?: None Help needed climbing 3-5 steps with a railing? : Total 6 Click Score: 21    End of Session Equipment Utilized During Treatment: Gait belt Activity Tolerance: Patient tolerated treatment well Patient left: in chair;with call bell/phone within reach   PT Visit Diagnosis: Difficulty in walking, not elsewhere classified (R26.2);Pain Pain - Right/Left: Right Pain - part of body: Leg    Time: 1140-1203 PT Time  Calculation (min) (ACUTE ONLY): 23 min   Charges:   PT Evaluation $PT Eval Low Complexity: 1 Low PT Treatments $Gait Training: 8-22 mins        Magda Kiel, PT Acute Rehabilitation Services Office:[phone] 01/15/2022   Lorraine Ryan 01/15/2022, 1:54 PM

## 2022-01-15 NOTE — Assessment & Plan Note (Signed)
BP normal - Resume furosemide and metoprolol - Hold lisionpril

## 2022-01-15 NOTE — Hospital Course (Addendum)
Lorraine Ryan is a 66 y.o. F with hx HTN, morbid obesity, schizoaffective disorder and reported stroke who presented with right leg pain, redness and swelling.  [name] is homeless, has been using a gym hot tub to soak her wound.  Symptoms progressed and she had malaise so she came to the ER.

## 2022-01-15 NOTE — Evaluation (Signed)
Occupational Therapy Evaluation Patient Details Name: Lorraine Ryan MRN: [id number] DOB: 01/29/1956 Today's Date: 01/15/2022   History of Present Illness Lorraine Ryan is a 66 y.o. female who Presented with right leg pain swelling, blistering.  Admitted with RLE cellulitis. Past medical history significant of HTN, Stroke, Dementia, homelessness, bipolar disorder, schizoaffective disorder   Clinical Impression   Patient is currently requiring assistance with ADLs including up to minimal assist with Lower body ADLs, setup assist with Upper body ADLs,  as well as Min guard assist with functional transfers to toilet.  Current level of function is below patient's typical baseline.  During this evaluation, patient was limited by generalized weakness, impaired activity tolerance, RLE and back pain and baseline cognitive impairments, all of which has the potential to impact patient's safety and independence during functional mobility, as well as performance for ADLs.  Patient reports that she is without a home and plans to return to the streets. Pt reports that she does not have a desire to go elsewhere.  Patient demonstrates fair rehab potential, and should benefit from continued skilled occupational therapy services while in acute care to maximize safety, independence and quality of life.   ?       Recommendations for follow up therapy are one component of a multi-disciplinary discharge planning process, led by the attending physician.  Recommendations may be updated based on patient status, additional functional criteria and insurance authorization.   Follow Up Recommendations  No OT follow up     Assistance Recommended at Discharge Set up Supervision/Assistance  Patient can return home with the following A little help with bathing/dressing/bathroom    Functional Status Assessment  Patient has had a recent decline in their functional status and demonstrates the ability to make significant  improvements in function in a reasonable and predictable amount of time.  Equipment Recommendations  Other (comment) (TBA. Unsure of pt's ability to manage DME due to homelessness)    Recommendations for Other Services       Precautions / Restrictions Precautions Precautions: Fall Restrictions Weight Bearing Restrictions: No      Mobility Bed Mobility Overal bed mobility: Needs Assistance Bed Mobility: Supine to Sit     Supine to sit: HOB elevated, Supervision     General bed mobility comments: Increased time/effort, pt elevating HOB to max position and heavy use of bed rails.    Transfers                          Balance Overall balance assessment: Needs assistance   Sitting balance-Leahy Scale: Good     Standing balance support: Single extremity supported Standing balance-Leahy Scale: Fair                             ADL either performed or assessed with clinical judgement   ADL Overall ADL's : Needs assistance/impaired Eating/Feeding: Independent;Sitting   Grooming: Wash/dry hands;Standing;Supervision/safety   Upper Body Bathing: Set up;Sitting   Lower Body Bathing: Minimal assistance;Sitting/lateral leans;Sit to/from stand Lower Body Bathing Details (indicate cue type and reason): Cannot fully reach distal RLE due to swelling plus body habitus Upper Body Dressing : Set up;Sitting   Lower Body Dressing: Minimal assistance;Sitting/lateral leans;Sit to/from stand Lower Body Dressing Details (indicate cue type and reason): Pt able to don LT sock but not RT sock. Min As using backwards chaining. Toilet Transfer: BSC/3in1;Min guard;Ambulation;Rolling walker (2 wheels) Toilet Transfer Details (indicate cue  type and reason): BSC placed atop toilet. Ptu sed RW to ambulate from EOB to bathroom and bathroom to sink. Pt abdondoned RW at sink and walked around bed to recliner with one hand furniture cruising. Toileting- Water quality scientist and  Hygiene: Min guard       Functional mobility during ADLs: Supervision/safety;Min guard       Vision   Vision Assessment?: No apparent visual deficits     Perception     Praxis      Pertinent Vitals/Pain Pain Assessment Pain Assessment: 0-10 Pain Score: 10-Worst pain ever Pain Location: 10/10 chronic back pain. 9/10 to RT leg Pain Intervention(s): Limited activity within patient's tolerance, Patient requesting pain meds-RN notified, RN gave pain meds during session     Hand Dominance     Extremity/Trunk Assessment Upper Extremity Assessment Upper Extremity Assessment: Overall WFL for tasks assessed   Lower Extremity Assessment Lower Extremity Assessment: Defer to PT evaluation       Communication Communication Communication: No difficulties   Cognition Arousal/Alertness: Awake/alert Behavior During Therapy: WFL for tasks assessed/performed Overall Cognitive Status: Within Functional Limits for tasks assessed                                 General Comments: A&Ox4, guarded, decreased attention to instructions-needs reminders/cues.     General Comments       Exercises     Shoulder Instructions      Home Living Family/patient expects to be discharged to:: Shelter/Homeless                                 Additional Comments: Pt is guarded with information. Denied staying in shelters and nodded to tent and sleeping bag. Pt reporting, "I just stay out of people's way."      Prior Functioning/Environment Prior Level of Function : Independent/Modified Independent             Mobility Comments: Pt denies use of any AD for ambulation.          OT Problem List: Pain;Decreased activity tolerance;Decreased safety awareness;Impaired balance (sitting and/or standing);Decreased knowledge of use of DME or AE;Decreased knowledge of precautions      OT Treatment/Interventions: Self-care/ADL training;Therapeutic activities;Cognitive  remediation/compensation;DME and/or AE instruction;Balance training;Patient/family education    OT Goals(Current goals can be found in the care plan section) Acute Rehab OT Goals OT Goal Formulation: Patient unable to participate in goal setting Time For Goal Achievement: 01/29/22 Potential to Achieve Goals: Fair ADL Goals Pt Will Perform Grooming: with modified independence;standing Pt Will Perform Lower Body Bathing: with modified independence;with adaptive equipment Pt Will Perform Lower Body Dressing: with modified independence;with adaptive equipment (lease amount of equipment able due to homelessness) Pt Will Transfer to Toilet: with modified independence;ambulating Pt Will Perform Toileting - Clothing Manipulation and hygiene: with modified independence  OT Frequency: Min 2X/week    Co-evaluation              AM-PAC OT "6 Clicks" Daily Activity     Outcome Measure Help from another person eating meals?: None Help from another person taking care of personal grooming?: A Little Help from another person toileting, which includes using toliet, bedpan, or urinal?: A Little Help from another person bathing (including washing, rinsing, drying)?: A Little Help from another person to put on and taking off regular upper body clothing?: A Little  Help from another person to put on and taking off regular lower body clothing?: A Little 6 Click Score: 19   End of Session Equipment Utilized During Treatment: Rolling walker (2 wheels) (Pt refused gait belt) Nurse Communication: Patient requests pain meds  Activity Tolerance: Patient tolerated treatment well Patient left: in chair;with call bell/phone within reach;with chair alarm set  OT Visit Diagnosis: Unsteadiness on feet (R26.81);Pain Pain - Right/Left: Right Pain - part of body: Leg (and back)                Time: 2820-6015 OT Time Calculation (min): 28 min Charges:  OT General Charges $OT Visit: 1 Visit OT Evaluation $OT  Eval Low Complexity: 1 Low OT Treatments $Self Care/Home Management : 8-22 mins  Anderson Malta, OT Acute Rehab Services Office: [phone] 01/15/2022  Julien Girt 01/15/2022, 9:39 AM

## 2022-01-15 NOTE — Assessment & Plan Note (Signed)
resolved 

## 2022-01-16 DIAGNOSIS — L03115 Cellulitis of right lower limb: Secondary | ICD-10-CM | POA: Diagnosis not present

## 2022-01-16 LAB — BASIC METABOLIC PANEL
Anion gap: 12 (ref 5–15)
BUN: 6 mg/dL — ABNORMAL LOW (ref 8–23)
CO2: 22 mmol/L (ref 22–32)
Calcium: 8.7 mg/dL — ABNORMAL LOW (ref 8.9–10.3)
Chloride: 107 mmol/L (ref 98–111)
Creatinine, Ser: 0.71 mg/dL (ref 0.44–1.00)
GFR, Estimated: >60 mL/min (ref >60)
Glucose, Bld: 89 mg/dL (ref 70–99)
Potassium: 3.1 mmol/L — ABNORMAL LOW (ref 3.5–5.1)
Sodium: 141 mmol/L (ref 135–145)

## 2022-01-16 LAB — CBC
HCT: 32.6 % — ABNORMAL LOW (ref 36.0–46.0)
Hemoglobin: 11.2 g/dL — ABNORMAL LOW (ref 12.0–15.0)
MCH: 30 pg (ref 26.0–34.0)
MCHC: 34.4 g/dL (ref 30.0–36.0)
MCV: 87.4 fL (ref 80.0–100.0)
Platelets: 231 10*3/uL (ref 150–400)
RBC: 3.73 MIL/uL — ABNORMAL LOW (ref 3.87–5.11)
RDW: 13.5 % (ref 11.5–15.5)
WBC: 9.3 10*3/uL (ref 4.0–10.5)
nRBC: 0 % (ref 0.0–0.2)

## 2022-01-16 MED ORDER — ENOXAPARIN SODIUM 60 MG/0.6ML IJ SOSY
60.0000 mg | PREFILLED_SYRINGE | INTRAMUSCULAR | Status: DC
  Administered 2022-01-16: 60 mg via SUBCUTANEOUS
  Filled 2022-01-16: qty 0.6

## 2022-01-16 MED ORDER — ENOXAPARIN SODIUM 40 MG/0.4ML IJ SOSY: 40.0000 mg | PREFILLED_SYRINGE | INTRAMUSCULAR | Status: DC

## 2022-01-16 MED ORDER — POTASSIUM CHLORIDE CRYS ER 20 MEQ PO TBCR
40.0000 meq | EXTENDED_RELEASE_TABLET | ORAL | Status: AC
  Administered 2022-01-16: 40 meq via ORAL
  Filled 2022-01-16: qty 2

## 2022-01-16 NOTE — Assessment & Plan Note (Signed)
Potassium still 3.1 despite supplementation - Continue oral potassium - Magnesium normal

## 2022-01-16 NOTE — Progress Notes (Signed)
  Progress Note   Patient: Lorraine Ryan:[id number] DOB: 26-Nov-1955 DOA: 01/14/2022     2 DOS: the patient was seen and examined on 01/16/2022 at 8:52AM      Brief hospital course: Lorraine Ryan is a 66 y.o. F with hx HTN, morbid obesity, schizoaffective disorder and reported stroke who presented with right leg pain, redness and swelling.  [name] is homeless, has been using a gym hot tub to soak her wound.  Symptoms progressed and she had malaise so she came to the ER.           Assessment and Plan: * Cellulitis - Continue cefepime - Follow surface culture for pseudomonas, if none, will narrow to first gen cephalosporin and transition to orals  Diarrhea Resolved  Obesity, Class III, BMI 40-49.9 (morbid obesity) ([facility]) BMI 43  Hypokalemia Potassium still 3.1 despite supplementation - Continue oral potassium - Magnesium normal  Homeless single person     Essential hypertension BP normal - Resume furosemide and metoprolol - Hold lisionpril  Schizoaffective psychosis ([place]) Not on active treatment. No evidence of psychosis, delusions, hallucinations, disorganized thinking.  History of stroke Not on aspirin - Continue atorvastatin          Subjective: Still with severe pain in the right leg.  No fever, confusion, respiraotry symptoms.  Still mild drainage from that leg.     Physical Exam: BP (!) 122/58 (BP Location: Right Arm)   Pulse 77   Temp 98.5 F (36.9 C) (Oral)   Resp 18   Ht '5\' 6"'$  (1.676 m) Comment: pt stated  Wt 122.5 kg Comment: pt stated  SpO2 98%   BMI 43.58 kg/m   Obese adult female, lying in bed, interactive and appropriate. RRR, no murmurs, no peripheral edema Respiratory rate normal, lungs clear without rales or wheezes Abdomen soft without tenderness palpation or guarding, no ascites or distention Attention normal, affect appropriate, judgment insight appear normal The right lower leg is red and has swelling, slightly  improved from yesterday, still marked and very tender with serous drainage.  No focal area of fluctuance.   Data Reviewed: Cultures no growth to date Patient metabolic panel shows persistent hyponatremia CBC shows mild anemia, no change from yesterday    Family Communication: None present    Disposition: Status is: Inpatient Patient was admitted with cellulitis.  White count is normalized, but we are awaiting culture data before transitioning to orals as she has risk factors for Pseudomonas        Author: Edwin Dada, MD 01/16/2022 2:02 PM  For on call review [email or web address].

## 2022-01-17 ENCOUNTER — Other Ambulatory Visit (HOSPITAL_COMMUNITY): Payer: Self-pay

## 2022-01-17 DIAGNOSIS — E876 Hypokalemia: Secondary | ICD-10-CM

## 2022-01-17 LAB — BASIC METABOLIC PANEL
Anion gap: 7 (ref 5–15)
BUN: 8 mg/dL (ref 8–23)
CO2: 25 mmol/L (ref 22–32)
Calcium: 8.6 mg/dL — ABNORMAL LOW (ref 8.9–10.3)
Chloride: 110 mmol/L (ref 98–111)
Creatinine, Ser: 0.83 mg/dL (ref 0.44–1.00)
GFR, Estimated: >60 mL/min (ref >60)
Glucose, Bld: 93 mg/dL (ref 70–99)
Potassium: 3.2 mmol/L — ABNORMAL LOW (ref 3.5–5.1)
Sodium: 142 mmol/L (ref 135–145)

## 2022-01-17 MED ORDER — POTASSIUM CHLORIDE CRYS ER 20 MEQ PO TBCR
40.0000 meq | EXTENDED_RELEASE_TABLET | Freq: Three times a day (TID) | ORAL | Status: DC
  Administered 2022-01-17: 40 meq via ORAL
  Filled 2022-01-17: qty 2

## 2022-01-17 MED ORDER — FUROSEMIDE 20 MG PO TABS
20.0000 mg | ORAL_TABLET | Freq: Every day | ORAL | 1 refills | Status: DC
  Filled 2022-01-17 – 2022-04-30 (×2): qty 90, 90d supply, fill #0

## 2022-01-17 MED ORDER — CEFADROXIL 500 MG PO CAPS
500.0000 mg | ORAL_CAPSULE | Freq: Two times a day (BID) | ORAL | 0 refills | Status: DC
  Filled 2022-01-17: qty 28, 14d supply, fill #0

## 2022-01-17 MED ORDER — POTASSIUM CHLORIDE CRYS ER 20 MEQ PO TBCR
20.0000 meq | EXTENDED_RELEASE_TABLET | Freq: Every evening | ORAL | 3 refills | Status: DC
  Filled 2022-01-17: qty 30, 30d supply, fill #0

## 2022-01-17 NOTE — Care Management Important Message (Signed)
Important Message  Patient Details  Name: Lorraine Ryan MRN: [id number] Date of Birth: 1955/08/21   Medicare Important Message Given:  Yes     Dareen Gutzwiller [address] 01/17/2022, 11:16 AM

## 2022-01-17 NOTE — Discharge Summary (Signed)
Physician Discharge Summary   Patient: Lorraine Ryan MRN: [id number] DOB: [birth year]  Admit date:     01/14/2022  Discharge date: 01/17/22  Discharge Physician: Edwin Dada   PCP: Eugenia Pancoast, FNP     Recommendations at discharge:  Follow up with PCP Tabitha Dugal in 10-14 days for cellulitis     Discharge Diagnoses: Principal Problem:   Cellulitis Active Problems:   History of stroke   Schizoaffective psychosis ([place])   Essential hypertension   Homeless single person   Hypokalemia   Obesity, Class III, BMI 40-49.9 (morbid obesity) ([place])   Diarrhea      Hospital Course: Lorraine Ryan is a 66 y.o. F with hx HTN, morbid obesity, schizoaffective disorder and reported stroke who presented with right leg pain, redness and swelling.  [name] is homeless, has been using a gym hot tub to soak her wound.  Symptoms progressed and she had malaise so she came to the ER.         * Cellulitis Admitted on Cefepime.  Doubt pseudomonas, surface culture without GNRs.  Transitioned to cefardoxil for 14 days, recommended elevation and close PCP follow up.    Patient taking orals, afebrile, heart rate < 100bpm, RR < 24.   Stable for discharge.     Obesity, Class III, BMI 40-49.9 (morbid obesity) ([facility]) BMI 43  Hypokalemia Supplemented  Homeless single person    Essential hypertension BP normal  Schizoaffective psychosis ([place]) Not on active treatment. No evidence of psychosis, delusions, hallucinations, disorganized thinking.  History of stroke Not on aspirin            The [place] was reviewed for this patient prior to discharge.  Consultants: None Procedures performed: [place] leg ruled out DVT Disposition: Homeless Diet recommendation:  Discharge Diet Orders (From admission, onward)     Start     Ordered   01/17/22 0000  Diet - low sodium heart healthy        01/17/22 1115             DISCHARGE  MEDICATION: Allergies as of 01/17/2022       Reactions   Codeine Other (See Comments)   Headache   Oxycontin [oxycodone] Nausea Only, Palpitations   Prednisone Other (See Comments)   Headache   Ultram [tramadol] Nausea Only        Medication List     STOP taking these medications    lisinopril 20 MG tablet Commonly known as: ZESTRIL       TAKE these medications    cefadroxil 500 MG capsule Commonly known as: DURICEF Take 1 capsule (500 mg total) by mouth 2 (two) times daily for 14 days.   furosemide 20 MG tablet Commonly known as: LASIX Take 1 tablet (20 mg total) by mouth daily.   metoprolol succinate 25 MG 24 hr tablet Commonly known as: Toprol XL Take 1 tablet (25 mg total) by mouth daily. What changed: when to take this   potassium chloride SA 20 MEQ tablet Commonly known as: Klor-Con M20 Take 1 tablet (20 mEq total) by mouth every evening.   rosuvastatin 40 MG tablet Commonly known as: CRESTOR TAKE 1 TABLET BY MOUTH DAILY What changed: when to take this        Follow-up Information     Eugenia Pancoast, FNP. Schedule an appointment as soon as possible for a visit in 1 week(s).   Specialty: Family Medicine Contact information: [place]  [place] [address] [phone]                 Discharge Instructions     Diet - low sodium heart healthy   Complete by: As directed    Discharge instructions   Complete by: As directed    **IMPORTANT DISCHARGE INSTRUCTIONS**   From Dr. Loleta Books: You were admitted for cellulitis You were treated here with antibiotics You had an ultrasound of the leg that ruled out blood clots You should take the antibiotic cefadroxil 500 mg twice daily for 2 weeks Go see your primary doctor in 2 weeks  Some redness and swelling is expected, this is likely to improve over the next 7-10 days  If you have increasing pain, obviously spreading redness, fever, or chills, return to the hospital  Keep  the leg elevated AS MUCH AS POSSIBLE  You may shower and wash with soap and water and pat dry, do not submerge in water   Increase activity slowly   Complete by: As directed        Discharge Exam: Filed Weights   01/14/22 1038 01/14/22 2121  Weight: 122.5 kg 122.5 kg    General: Pt is alert, awake, not in acute distress Cardiovascular: RRR, nl S1-S2, no murmurs appreciated.   Mild edema of the right leg Respiratory: Normal respiratory rate and rhythm.  CTAB without rales or wheezes. Abdominal: Abdomen soft and non-tender.  No distension or HSM.   Neuro/Psych: Strength symmetric in upper and lower extremities.  Judgment and insight appear normal.   Condition at discharge: good  The results of significant diagnostics from this hospitalization (including imaging, microbiology, ancillary and laboratory) are listed below for reference.   Imaging Studies: DG Tibia/Fibula Right  Result Date: 01/14/2022 CLINICAL DATA:  Right leg swelling for 2 days. EXAM: RIGHT TIBIA AND FIBULA - 2 VIEW COMPARISON:  Correlation with right foot radiographs 08/29/2021 FINDINGS: Mildly decreased bone mineralization. Mild-to-moderate medial compartment of the knee joint space narrowing and peripheral osteophytosis. Mild lateral compartment of the knee peripheral osteophytosis. Mild-to-moderate patellofemoral joint space narrowing. Mild superior and inferior patellar degenerative osteophytes. No knee joint effusion. No acute fracture or dislocation. Moderate enthesopathic spurring at the Achilles insertion on the calcaneus. Moderate dorsal talonavicular degenerative osteophytosis. IMPRESSION: Mild-to-moderate medial compartment and patellofemoral compartment osteoarthritis. Electronically Signed   By: Yvonne Kendall M.D.   On: 01/14/2022 18:30   VAS [place] LOWER EXTREMITY VENOUS (DVT) (7a-7p)  Result Date: 01/14/2022  Lower Venous DVT Study [name]  Date of Exam:   01/14/2022 Medical Rec #:  [id number]        Accession #:    [id number] Date of Birth: November 26, 1955        Patient Gender: F Patient Age:   23 years Exam Location:  [facility] Procedure:      VAS [place] LOWER EXTREMITY VENOUS (DVT) Referring Phys: Dorise Bullion --------------------------------------------------------------------------------  Indications: Pain.  Limitations: Body habitus and poor ultrasound/tissue interface. Comparison Study: no prior Performing Technologist: Archie Patten RVS  Examination Guidelines: A complete evaluation includes B-mode imaging, spectral Doppler, color Doppler, and power Doppler as needed of all accessible portions of each vessel. Bilateral testing is considered an integral part of a complete examination. Limited examinations for reoccurring indications may be performed as noted. The reflux portion of the exam is performed with the patient in reverse Trendelenburg.  +--------+---------------+---------+-----------+----------+--------------------+ RIGHT   CompressibilityPhasicitySpontaneityPropertiesThrombus Aging       +--------+---------------+---------+-----------+----------+--------------------+ CFV     Full  Yes      Yes                                       +--------+---------------+---------+-----------+----------+--------------------+ SFJ     Full                                                              +--------+---------------+---------+-----------+----------+--------------------+ FV Prox Full                                                              +--------+---------------+---------+-----------+----------+--------------------+ FV Mid                 Yes      Yes                  patent by color                                                           doppler              +--------+---------------+---------+-----------+----------+--------------------+ FV                     Yes      Yes                  patent by color       Distal                                               doppler              +--------+---------------+---------+-----------+----------+--------------------+ PFV     Full                                                              +--------+---------------+---------+-----------+----------+--------------------+ POP     Full           Yes      Yes                                       +--------+---------------+---------+-----------+----------+--------------------+ PTV                    Yes      Yes                  patent by color  doppler              +--------+---------------+---------+-----------+----------+--------------------+ PERO                   Yes      Yes                  patent by color                                                           doppler              +--------+---------------+---------+-----------+----------+--------------------+   +----+---------------+---------+-----------+----------+--------------+ LEFTCompressibilityPhasicitySpontaneityPropertiesThrombus Aging +----+---------------+---------+-----------+----------+--------------+ CFV Full           Yes      Yes                                 +----+---------------+---------+-----------+----------+--------------+     Summary: RIGHT: - There is no evidence of deep vein thrombosis in the lower extremity. However, portions of this examination were limited- see technologist comments above.  - No cystic structure found in the popliteal fossa.  LEFT: - No evidence of common femoral vein obstruction.  *See table(s) above for measurements and observations. Electronically signed by Monica Martinez MD on 01/14/2022 at 4:29:20 PM.    Final     Microbiology: Results for orders placed or performed during the hospital encounter of 01/14/22  Aerobic Culture w Gram Stain (superficial specimen)     Status: None (Preliminary result)    Collection Time: 01/15/22  7:32 AM   Specimen: Ulcer  Result Value Ref Range Status   Specimen Description ULCER  Final   Special Requests NONE  Final   Gram Stain NO WBC SEEN NO ORGANISMS SEEN   Final   Culture   Final    RARE NORMAL SKIN FLORA Performed at [facility], [address], [place], [place] [address]    Report Status PENDING  Incomplete    Labs: CBC: Recent Labs  Lab 01/14/22 1723 01/15/22 0220 01/16/22 0259  WBC 13.0* 11.9* 9.3  NEUTROABS 9.2*  --   --   HGB 13.3 11.1* 11.2*  HCT 40.2 31.6* 32.6*  MCV 91.4 86.3 87.4  PLT 191 217 782   Basic Metabolic Panel: Recent Labs  Lab 01/14/22 1723 01/14/22 2156 01/15/22 0220 01/16/22 0259 01/17/22 0240  NA 135  --  137 141 142  K 3.5  --  2.5* 3.1* 3.2*  CL 102  --  103 107 110  CO2 20*  --  '24 22 25  '$ GLUCOSE 93  --  106* 89 93  BUN 8  --  5* 6* 8  CREATININE 0.78  --  0.78 0.71 0.83  CALCIUM 9.0  --  8.4* 8.7* 8.6*  MG  --  2.0 1.9  --   --   PHOS  --  2.8 2.8  --   --    Liver Function Tests: Recent Labs  Lab 01/14/22 2156 01/15/22 0220  AST 64* 50*  ALT 48* 42  ALKPHOS 79 70  BILITOT 1.1 1.4*  PROT 7.2 6.3*  ALBUMIN 2.6* 2.4*   CBG: Recent Labs  Lab 01/14/22 1554  GLUCAP 80    Discharge time spent: approximately  35 minutes spent on discharge counseling, evaluation of patient on day of discharge, and coordination of discharge planning with nursing, social work, pharmacy and case management  Signed: Edwin Dada, MD [facility] 01/17/2022

## 2022-01-18 ENCOUNTER — Telehealth: Payer: Self-pay

## 2022-01-18 LAB — AEROBIC CULTURE W GRAM STAIN (SUPERFICIAL SPECIMEN)
Culture: NORMAL
Gram Stain: NONE SEEN

## 2022-01-18 NOTE — Telephone Encounter (Signed)
Transition Care Management Follow-up Telephone Call Date of discharge and from where: [facility] 01/17/2022 How have you been since you were released from the hospital? Leg still hurting Any questions or concerns? No  Items Reviewed: Did the pt receive and understand the discharge instructions provided? Yes  Medications obtained and verified? Yes  Other? No  Any new allergies since your discharge? No  Dietary orders reviewed? Yes Do you have support at home? Yes   Home Care and Equipment/Supplies: Were home health services ordered? no If so, what is the name of the agency? N/a  Has the agency set up a time to come to the patient's home? not applicable Were any new equipment or medical supplies ordered?  No What is the name of the medical supply agency? N/a Were you able to get the supplies/equipment? not applicable Do you have any questions related to the use of the equipment or supplies? No  Functional Questionnaire: (I = Independent and D = Dependent) ADLs: I  Bathing/Dressing- I  Meal Prep- I  Eating- I  Maintaining continence- I  Transferring/Ambulation- I  Managing Meds- I  Follow up appointments reviewed:  PCP Hospital f/u appt confirmed? Yes  Scheduled to see Alma Friendly on 01/24/2022 @ 10:20. [facility] f/u appt confirmed? No   Are transportation arrangements needed? No  If their condition worsens, is the pt aware to call PCP or go to the Emergency Dept.? Yes Was the patient provided with contact information for the PCP's office or ED? Yes Was to pt encouraged to call back with questions or concerns? Yes .lh

## 2022-01-20 ENCOUNTER — Emergency Department (HOSPITAL_COMMUNITY): Payer: Medicare Other

## 2022-01-20 ENCOUNTER — Other Ambulatory Visit: Payer: Self-pay

## 2022-01-20 ENCOUNTER — Emergency Department (HOSPITAL_BASED_OUTPATIENT_CLINIC_OR_DEPARTMENT_OTHER): Payer: Medicare Other

## 2022-01-20 ENCOUNTER — Observation Stay (HOSPITAL_COMMUNITY)
Admission: EM | Admit: 2022-01-20 | Discharge: 2022-01-22 | Disposition: A | Discharge status: Home / Self Care | Payer: Medicare Other | Attending: Internal Medicine | Admitting: Internal Medicine

## 2022-01-20 ENCOUNTER — Observation Stay (HOSPITAL_COMMUNITY): Payer: Medicare Other

## 2022-01-20 DIAGNOSIS — L0390 Cellulitis, unspecified: Secondary | ICD-10-CM | POA: Diagnosis not present

## 2022-01-20 DIAGNOSIS — Z87891 Personal history of nicotine dependence: Secondary | ICD-10-CM | POA: Insufficient documentation

## 2022-01-20 DIAGNOSIS — E119 Type 2 diabetes mellitus without complications: Secondary | ICD-10-CM | POA: Diagnosis not present

## 2022-01-20 DIAGNOSIS — L538 Other specified erythematous conditions: Secondary | ICD-10-CM

## 2022-01-20 DIAGNOSIS — I10 Essential (primary) hypertension: Secondary | ICD-10-CM | POA: Diagnosis present

## 2022-01-20 DIAGNOSIS — Z8673 Personal history of transient ischemic attack (TIA), and cerebral infarction without residual deficits: Secondary | ICD-10-CM | POA: Insufficient documentation

## 2022-01-20 DIAGNOSIS — I110 Hypertensive heart disease with heart failure: Secondary | ICD-10-CM | POA: Insufficient documentation

## 2022-01-20 DIAGNOSIS — M7989 Other specified soft tissue disorders: Secondary | ICD-10-CM

## 2022-01-20 DIAGNOSIS — R52 Pain, unspecified: Secondary | ICD-10-CM | POA: Diagnosis not present

## 2022-01-20 DIAGNOSIS — R600 Localized edema: Secondary | ICD-10-CM | POA: Diagnosis not present

## 2022-01-20 DIAGNOSIS — I5032 Chronic diastolic (congestive) heart failure: Secondary | ICD-10-CM | POA: Diagnosis not present

## 2022-01-20 DIAGNOSIS — L03115 Cellulitis of right lower limb: Secondary | ICD-10-CM | POA: Diagnosis not present

## 2022-01-20 DIAGNOSIS — Z79899 Other long term (current) drug therapy: Secondary | ICD-10-CM | POA: Insufficient documentation

## 2022-01-20 DIAGNOSIS — F319 Bipolar disorder, unspecified: Secondary | ICD-10-CM | POA: Diagnosis present

## 2022-01-20 DIAGNOSIS — E876 Hypokalemia: Secondary | ICD-10-CM | POA: Diagnosis not present

## 2022-01-20 DIAGNOSIS — F0390 Unspecified dementia without behavioral disturbance: Secondary | ICD-10-CM | POA: Insufficient documentation

## 2022-01-20 DIAGNOSIS — R2241 Localized swelling, mass and lump, right lower limb: Secondary | ICD-10-CM | POA: Diagnosis present

## 2022-01-20 LAB — CBC WITH DIFFERENTIAL/PLATELET
Abs Immature Granulocytes: 0.04 10*3/uL (ref 0.00–0.07)
Basophils Absolute: 0.1 10*3/uL (ref 0.0–0.1)
Basophils Relative: 1 %
Eosinophils Absolute: 0.2 10*3/uL (ref 0.0–0.5)
Eosinophils Relative: 3 %
HCT: 36 % (ref 36.0–46.0)
Hemoglobin: 11.5 g/dL — ABNORMAL LOW (ref 12.0–15.0)
Immature Granulocytes: 1 %
Lymphocytes Relative: 26 %
Lymphs Abs: 1.7 10*3/uL (ref 0.7–4.0)
MCH: 29.6 pg (ref 26.0–34.0)
MCHC: 31.9 g/dL (ref 30.0–36.0)
MCV: 92.8 fL (ref 80.0–100.0)
Monocytes Absolute: 0.5 10*3/uL (ref 0.1–1.0)
Monocytes Relative: 7 %
Neutro Abs: 4 10*3/uL (ref 1.7–7.7)
Neutrophils Relative %: 62 %
Platelets: 340 10*3/uL (ref 150–400)
RBC: 3.88 MIL/uL (ref 3.87–5.11)
RDW: 13.6 % (ref 11.5–15.5)
WBC: 6.5 10*3/uL (ref 4.0–10.5)
nRBC: 0 % (ref 0.0–0.2)

## 2022-01-20 LAB — COMPREHENSIVE METABOLIC PANEL
ALT: 19 U/L (ref 0–44)
AST: 21 U/L (ref 15–41)
Albumin: 2.7 g/dL — ABNORMAL LOW (ref 3.5–5.0)
Alkaline Phosphatase: 81 U/L (ref 38–126)
Anion gap: 7 (ref 5–15)
BUN: 9 mg/dL (ref 8–23)
CO2: 27 mmol/L (ref 22–32)
Calcium: 8.8 mg/dL — ABNORMAL LOW (ref 8.9–10.3)
Chloride: 105 mmol/L (ref 98–111)
Creatinine, Ser: 0.68 mg/dL (ref 0.44–1.00)
GFR, Estimated: >60 mL/min (ref >60)
Glucose, Bld: 97 mg/dL (ref 70–99)
Potassium: 3.4 mmol/L — ABNORMAL LOW (ref 3.5–5.1)
Sodium: 139 mmol/L (ref 135–145)
Total Bilirubin: 0.5 mg/dL (ref 0.3–1.2)
Total Protein: 7.4 g/dL (ref 6.5–8.1)

## 2022-01-20 LAB — LACTIC ACID, PLASMA: Lactic Acid, Venous: 1.6 mmol/L (ref 0.5–1.9)

## 2022-01-20 MED ORDER — POTASSIUM CHLORIDE CRYS ER 20 MEQ PO TBCR
20.0000 meq | EXTENDED_RELEASE_TABLET | Freq: Every evening | ORAL | Status: DC
  Administered 2022-01-21: 20 meq via ORAL
  Filled 2022-01-20: qty 1

## 2022-01-20 MED ORDER — SODIUM CHLORIDE 0.9 % IV SOLN
2.0000 g | INTRAVENOUS | Status: DC
  Administered 2022-01-21 – 2022-01-22 (×2): 2 g via INTRAVENOUS
  Filled 2022-01-20 (×2): qty 20

## 2022-01-20 MED ORDER — DOXYCYCLINE HYCLATE 100 MG PO TABS
100.0000 mg | ORAL_TABLET | Freq: Two times a day (BID) | ORAL | Status: DC
  Administered 2022-01-20 – 2022-01-22 (×5): 100 mg via ORAL
  Filled 2022-01-20 (×5): qty 1

## 2022-01-20 MED ORDER — SODIUM CHLORIDE 0.9 % IV SOLN: 1.0000 g | Freq: Once | INTRAVENOUS | Status: DC

## 2022-01-20 MED ORDER — ACETAMINOPHEN 325 MG PO TABS
650.0000 mg | ORAL_TABLET | ORAL | Status: DC | PRN
  Administered 2022-01-21 (×3): 650 mg via ORAL
  Filled 2022-01-20 (×3): qty 2

## 2022-01-20 MED ORDER — FUROSEMIDE 10 MG/ML IJ SOLN
40.0000 mg | Freq: Once | INTRAMUSCULAR | Status: AC
  Administered 2022-01-20: 40 mg via INTRAVENOUS
  Filled 2022-01-20: qty 4

## 2022-01-20 MED ORDER — SODIUM CHLORIDE 0.9 % IV SOLN: 250.0000 mL | INTRAVENOUS | Status: DC | PRN

## 2022-01-20 MED ORDER — ENOXAPARIN SODIUM 40 MG/0.4ML IJ SOSY
40.0000 mg | PREFILLED_SYRINGE | INTRAMUSCULAR | Status: DC
  Administered 2022-01-20 – 2022-01-21 (×2): 40 mg via SUBCUTANEOUS
  Filled 2022-01-20 (×2): qty 0.4

## 2022-01-20 MED ORDER — METOPROLOL SUCCINATE ER 25 MG PO TB24
25.0000 mg | ORAL_TABLET | Freq: Every evening | ORAL | Status: DC
  Administered 2022-01-20 – 2022-01-21 (×2): 25 mg via ORAL
  Filled 2022-01-20 (×2): qty 1

## 2022-01-20 MED ORDER — SODIUM CHLORIDE 0.9% FLUSH: 3.0000 mL | INTRAVENOUS | Status: DC | PRN

## 2022-01-20 MED ORDER — FUROSEMIDE 20 MG PO TABS
20.0000 mg | ORAL_TABLET | Freq: Every day | ORAL | Status: DC
  Administered 2022-01-21 – 2022-01-22 (×2): 20 mg via ORAL
  Filled 2022-01-20 (×2): qty 1

## 2022-01-20 MED ORDER — POTASSIUM CHLORIDE CRYS ER 20 MEQ PO TBCR
40.0000 meq | EXTENDED_RELEASE_TABLET | ORAL | Status: AC
  Administered 2022-01-20 (×2): 40 meq via ORAL
  Filled 2022-01-20 (×2): qty 2

## 2022-01-20 MED ORDER — SODIUM CHLORIDE 0.9% FLUSH
3.0000 mL | Freq: Two times a day (BID) | INTRAVENOUS | Status: DC
  Administered 2022-01-21 – 2022-01-22 (×3): 3 mL via INTRAVENOUS

## 2022-01-20 MED ORDER — SODIUM CHLORIDE 0.9 % IV SOLN: 2.0000 g | INTRAVENOUS | Status: DC

## 2022-01-20 MED ORDER — ROSUVASTATIN CALCIUM 20 MG PO TABS
40.0000 mg | ORAL_TABLET | Freq: Every day | ORAL | Status: DC
  Administered 2022-01-20 – 2022-01-22 (×3): 40 mg via ORAL
  Filled 2022-01-20 (×3): qty 2

## 2022-01-20 MED ORDER — SODIUM CHLORIDE 0.9 % IV SOLN
2.0000 g | Freq: Once | INTRAVENOUS | Status: AC
  Administered 2022-01-20: 2 g via INTRAVENOUS
  Filled 2022-01-20: qty 20

## 2022-01-20 MED ORDER — ONDANSETRON HCL 4 MG/2ML IJ SOLN: 4.0000 mg | Freq: Four times a day (QID) | INTRAMUSCULAR | Status: DC | PRN

## 2022-01-20 NOTE — ED Notes (Signed)
[facility] called to request initiation of purpleman.

## 2022-01-20 NOTE — Progress Notes (Signed)
VASCULAR LAB    Right lower extremity venous duplex has been performed.  See CV proc for preliminary results.  Messaged negative results to Dr. Oswald Hillock via secure chat   Sharion Dove, RVT 01/20/2022, 12:53 PM

## 2022-01-20 NOTE — ED Notes (Signed)
Patient ambulated to restroom per her request.

## 2022-01-20 NOTE — ED Notes (Signed)
Patient denies pain and is resting comfortably.  

## 2022-01-20 NOTE — ED Triage Notes (Signed)
Patient reports worsening right leg swelling this week , denies injury. No fever or chills/respirations unlabored .

## 2022-01-20 NOTE — H&P (Signed)
History and Physical    Lorraine Ryan BUL:[id number] DOB: 08-20-1955 DOA: 01/20/2022  PCP: Eugenia Pancoast, FNP (Confirm with patient/family/NH records and if not entered, this has to be entered at [facility] point of entry) Patient coming from: Home  I have personally briefly reviewed patient's old medical records in [place]  Chief Complaint: Worsening of leg rash and pain and swelling  HPI: Lorraine Ryan is a 66 y.o. female with medical history significant of HTN, stroke, bipolar disorder, schizoaffective disorder, morbid obesity, presented with worsening of right leg swelling pain and rash.  Patient was just hospitalized last week for similar presentation, with new onset of right lower extremity rash swelling pain below the knee for 2 days, when she was diagnosed with right leg cellulitis and patient was given IV antibiotics x 3 days and then discharged on p.o. antibiotics Cefadroxil for 14 days, patient however never went to pick up her ABX from her pharmacy, she claiming that "did not know about any PO antibiotics". She has noticed swelling rash and pain gradually getting worse for last 3 days, denies any fever chills, and she continued noticed clear discharge from right lower extremities.  ED Course: Afebrile, none tachycardia nonhypotensive, nonhypoxic.  DVT study negative in the ED.  WBC 6.5.Marland Kitchen  Patient was started on ceftriaxone in the ED.  Review of Systems: As per HPI otherwise 14 point review of systems negative.    Past Medical History:  Diagnosis Date   Anxiety    Arthritis    Back pain    Bipolar disorder ([place])    Chronic headaches    Dementia (HCC)    Depression    Diabetes mellitus without complication (HCC)    GERD (gastroesophageal reflux disease)    Hypertension    No meds prescribed; states intermittent   Neuromuscular disorder (HCC)    Schizoaffective psychosis ([place]) 06/27/2020   Shortness of breath dyspnea    Stroke ([place])    caused numbness in leg     Past Surgical History:  Procedure Laterality Date   ABDOMINAL HYSTERECTOMY     no cervix   COLONOSCOPY WITH PROPOFOL N/A 04/18/2015   HOZ:[id number] hemorrhoids/diverticulosis sigmoid colon/   ESOPHAGOGASTRODUODENOSCOPY (EGD) WITH PROPOFOL N/A 04/18/2015   SLF:web in the proximal esophagus/dilated/   GANGLION CYST EXCISION     LUMBAR LAMINECTOMY/DECOMPRESSION MICRODISCECTOMY N/A 02/08/2015   Procedure: L4-5 Decompression;  Surgeon: Marybelle Killings, MD;  Location: [facility];  Service: Orthopedics;  Laterality: N/A;   POLYPECTOMY  04/18/2015   Procedure: POLYPECTOMY;  Surgeon: Danie Binder, MD;  Location: [facility];  Service: Endoscopy;;  ascending colon polyp   TONSILLECTOMY       reports that she quit smoking about 36 years ago. Her smoking use included cigarettes. She has a 5.00 pack-year smoking history. She has never used smokeless tobacco. She reports that she does not drink alcohol and does not use drugs.  Allergies  Allergen Reactions   Codeine Other (See Comments)    Headache   Oxycontin [Oxycodone] Nausea Only and Palpitations   Prednisone Other (See Comments)    Headache   Ultram [Tramadol] Nausea Only    Family History  Problem Relation Age of Onset   Diabetes Mother    Hypertension Mother    Heart disease Other        No family history   Breast cancer Sister        diagnosed in her 63's   Anxiety disorder Sister    Depression Sister  Anxiety disorder Brother    Depression Brother    Anxiety disorder Brother    Depression Brother    Cancer Maternal Grandmother        unknown type   Colon cancer Neg Hx     Prior to Admission medications   Medication Sig Start Date End Date Taking? Authorizing Provider  cefadroxil (DURICEF) 500 MG capsule Take 1 capsule (500 mg total) by mouth 2 (two) times daily for 14 days. 01/17/22 01/31/22  Danford, Suann Larry, MD  furosemide (LASIX) 20 MG tablet Take 1 tablet (20 mg total) by mouth daily. 01/17/22   Danford,  Suann Larry, MD  metoprolol succinate (TOPROL XL) 25 MG 24 hr tablet Take 1 tablet (25 mg total) by mouth daily. Patient taking differently: Take 25 mg by mouth every evening. 06/01/21   Eugenia Pancoast, FNP  potassium chloride SA (KLOR-CON M20) 20 MEQ tablet Take 1 tablet (20 mEq total) by mouth every evening. 01/17/22   Danford, Suann Larry, MD  rosuvastatin (CRESTOR) 40 MG tablet TAKE 1 TABLET BY MOUTH DAILY Patient taking differently: Take 40 mg by mouth every evening. 10/05/21   Eugenia Pancoast, FNP    Physical Exam: Vitals:   01/20/22 1149 01/20/22 1200 01/20/22 1230 01/20/22 1309  BP: (!) 124/99 (!) 126/96 (!) 141/74   Pulse: 77 73 73   Resp: 16  16   Temp:    98 F (36.7 C)  TempSrc:    Oral  SpO2: 99% 100% 98%     Constitutional: NAD, calm, comfortable Vitals:   01/20/22 1149 01/20/22 1200 01/20/22 1230 01/20/22 1309  BP: (!) 124/99 (!) 126/96 (!) 141/74   Pulse: 77 73 73   Resp: 16  16   Temp:    98 F (36.7 C)  TempSrc:    Oral  SpO2: 99% 100% 98%    Eyes: PERRL, lids and conjunctivae normal ENMT: Mucous membranes are moist. Posterior pharynx clear of any exudate or lesions.Normal dentition.  Neck: normal, supple, no masses, no thyromegaly Respiratory: clear to auscultation bilaterally, no wheezing, no crackles. Normal respiratory effort. No accessory muscle use.  Cardiovascular: Regular rate and rhythm, no murmurs / rubs / gallops. 2+ extremity edema, R>L. 2+ pedal pulses. No carotid bruits.  Abdomen: no tenderness, no masses palpated. No hepatosplenomegaly. Bowel sounds positive.  Musculoskeletal: no clubbing / cyanosis. No joint deformity upper and lower extremities. Good ROM, no contractures. Normal muscle tone.  Skin: Shiny and rash on right shin and calf, with severe tenderness. Neurologic: CN 2-12 grossly intact. Sensation intact, DTR normal. Strength 5/5 in all 4.  Psychiatric: Normal judgment and insight. Alert and oriented x 3. Normal mood.    Labs on  Admission: I have personally reviewed following labs and imaging studies  CBC: Recent Labs  Lab 01/14/22 1723 01/15/22 0220 01/16/22 0259 01/20/22 0210  WBC 13.0* 11.9* 9.3 6.5  NEUTROABS 9.2*  --   --  4.0  HGB 13.3 11.1* 11.2* 11.5*  HCT 40.2 31.6* 32.6* 36.0  MCV 91.4 86.3 87.4 92.8  PLT 191 217 231 458   Basic Metabolic Panel: Recent Labs  Lab 01/14/22 1723 01/14/22 2156 01/15/22 0220 01/16/22 0259 01/17/22 0240 01/20/22 0210  NA 135  --  137 141 142 139  K 3.5  --  2.5* 3.1* 3.2* 3.4*  CL 102  --  103 107 110 105  CO2 20*  --  '24 22 25 27  '$ GLUCOSE 93  --  106* 89 93 97  BUN  8  --  5* 6* 8 9  CREATININE 0.78  --  0.78 0.71 0.83 0.68  CALCIUM 9.0  --  8.4* 8.7* 8.6* 8.8*  MG  --  2.0 1.9  --   --   --   PHOS  --  2.8 2.8  --   --   --    GFR: Estimated Creatinine Clearance: 92.4 mL/min (by C-G formula based on SCr of 0.68 mg/dL). Liver Function Tests: Recent Labs  Lab 01/14/22 2156 01/15/22 0220 01/20/22 0210  AST 64* 50* 21  ALT 48* 42 19  ALKPHOS 79 70 81  BILITOT 1.1 1.4* 0.5  PROT 7.2 6.3* 7.4  ALBUMIN 2.6* 2.4* 2.7*   No results for input(s): "LIPASE", "AMYLASE" in the last 168 hours. No results for input(s): "AMMONIA" in the last 168 hours. Coagulation Profile: No results for input(s): "INR", "PROTIME" in the last 168 hours. Cardiac Enzymes: Recent Labs  Lab 01/14/22 2156  CKTOTAL 67   BNP (last 3 results) Recent Labs    06/21/21 1152  PROBNP 10.0   HbA1C: No results for input(s): "HGBA1C" in the last 72 hours. CBG: Recent Labs  Lab 01/14/22 1554  GLUCAP 80   Lipid Profile: No results for input(s): "CHOL", "HDL", "LDLCALC", "TRIG", "CHOLHDL", "LDLDIRECT" in the last 72 hours. Thyroid Function Tests: No results for input(s): "TSH", "T4TOTAL", "FREET4", "T3FREE", "THYROIDAB" in the last 72 hours. Anemia Panel: No results for input(s): "VITAMINB12", "FOLATE", "FERRITIN", "TIBC", "IRON", "RETICCTPCT" in the last 72 hours. Urine  analysis:    Component Value Date/Time   COLORURINE YELLOW 01/15/2022 0410   APPEARANCEUR CLOUDY (A) 01/15/2022 0410   APPEARANCEUR Clear 06/15/2018 1000   LABSPEC 1.013 01/15/2022 0410   PHURINE 6.0 01/15/2022 0410   GLUCOSEU NEGATIVE 01/15/2022 0410   HGBUR SMALL (A) 01/15/2022 0410   BILIRUBINUR NEGATIVE 01/15/2022 0410   BILIRUBINUR Negative 06/21/2021 1152   BILIRUBINUR Negative 06/15/2018 1000   KETONESUR NEGATIVE 01/15/2022 0410   PROTEINUR 30 (A) 01/15/2022 0410   UROBILINOGEN 0.2 06/21/2021 1152   UROBILINOGEN 1 07/05/2014 1103   NITRITE NEGATIVE 01/15/2022 0410   LEUKOCYTESUR NEGATIVE 01/15/2022 0410    Radiological Exams on Admission: VAS [place] LOWER EXTREMITY VENOUS (DVT) (ONLY [facility] & [facility])  Result Date: 01/20/2022  Lower Venous DVT Study Patient Name:  TILLA WILBORN  Date of Exam:   01/20/2022 Medical Rec #: [id number]        Accession #:    [id number] Date of Birth: 1955-12-22        Patient Gender: F Patient Age:   89 years Exam Location:  [facility] Procedure:      VAS [place] LOWER EXTREMITY VENOUS (DVT) Referring Phys: Cheri Rous COUNTRYMAN --------------------------------------------------------------------------------  Indications: Pain, Swelling, Erythema, and Cellulitis.  Limitations: Body habitus. Comparison Study: Prior negative right LEV done 01/14/22 Performing Technologist: Sharion Dove RVS  Examination Guidelines: A complete evaluation includes B-mode imaging, spectral Doppler, color Doppler, and power Doppler as needed of all accessible portions of each vessel. Bilateral testing is considered an integral part of a complete examination. Limited examinations for reoccurring indications may be performed as noted. The reflux portion of the exam is performed with the patient in reverse Trendelenburg.  +---------+---------------+---------+-----------+----------+-------------------+ RIGHT    CompressibilityPhasicitySpontaneityPropertiesThrombus Aging       +---------+---------------+---------+-----------+----------+-------------------+ CFV      Full           Yes      Yes                                      +---------+---------------+---------+-----------+----------+-------------------+  SFJ      Full                                                             +---------+---------------+---------+-----------+----------+-------------------+ FV Prox  Full                                                             +---------+---------------+---------+-----------+----------+-------------------+ FV Mid                  Yes      Yes                  patent by color and                                                       Doppler             +---------+---------------+---------+-----------+----------+-------------------+ FV Distal               Yes      Yes                  patent by color and                                                       Doppler             +---------+---------------+---------+-----------+----------+-------------------+ PFV      Full                                                             +---------+---------------+---------+-----------+----------+-------------------+ POP                     Yes      Yes                  patent by color and                                                       Doppler             +---------+---------------+---------+-----------+----------+-------------------+ PTV                     Yes      Yes                  patent by color and  Doppler             +---------+---------------+---------+-----------+----------+-------------------+ PERO                    Yes      Yes                  patent by color and                                                       Doppler             +---------+---------------+---------+-----------+----------+-------------------+    +----+---------------+---------+-----------+----------+--------------+ LEFTCompressibilityPhasicitySpontaneityPropertiesThrombus Aging +----+---------------+---------+-----------+----------+--------------+ CFV Full           Yes      Yes                                 +----+---------------+---------+-----------+----------+--------------+    Summary: RIGHT: - Findings appear essentially unchanged compared to previous examination. - There is no evidence of deep vein thrombosis in the lower extremity.  - No cystic structure found in the popliteal fossa. - Ultrasound characteristics of enlarged lymph nodes are noted in the groin.  LEFT: - No evidence of common femoral vein obstruction.  *See table(s) above for measurements and observations.    Preliminary    DG Tibia/Fibula Right  Result Date: 01/20/2022 CLINICAL DATA:  Wound to right lateral lower extremity EXAM: RIGHT TIBIA AND FIBULA - 2 VIEW COMPARISON:  None Available. FINDINGS: No fracture or dislocation is seen. The joint spaces are preserved. The visualized soft tissues are unremarkable. IMPRESSION: Negative. Electronically Signed   By: Julian Hy M.D.   On: 01/20/2022 02:45   DG Ankle 2 Views Right  Result Date: 01/20/2022 CLINICAL DATA:  Wound to right lateral lower extremity EXAM: RIGHT ANKLE - 2 VIEW COMPARISON:  None Available. FINDINGS: No fracture or dislocation is seen. The joint spaces are preserved. Mild diffuse soft tissue swelling. No radiopaque foreign body is seen. IMPRESSION: Mild diffuse soft tissue swelling. No fracture, dislocation, or radiopaque foreign body is seen. Electronically Signed   By: Julian Hy M.D.   On: 01/20/2022 02:45    EKG: Pending  Assessment/Plan Principal Problem:   Cellulitis Active Problems:   Bipolar disorder with psychotic features [facility])   Essential hypertension   Bilateral lower extremity edema  (please populate well all problems here in Problem List. (For example, if  patient is on BP meds at home and you resume or decide to hold them, it is a problem that needs to be her. Same for CAD, COPD, HLD and so on)  Cellulitis, moderate-severe -Non-compliant with home ABX regimen -With worsening of overall symptoms -Resume ceftriaxone, and doxycycline -Check MRSA and Strep A PCR, expect de-escalate to p.o. antibiotics in 24 hours and discharge home. -Discussed with patient regarding importance of compliance with p.o. antibiotics, patient promised that she will go pick up and take PO ABX after discharge home this time. -DVT negative  Acute, probably on chronic HFpEF decompensation -Mainly right-sided CHF decompensation with significant fluid overload -Continue her p.o. Lasix to IV Lasix x 1 days, probably can resume her p.o. Lasix tomorrow and discharge home. -Given her significant risk of noncompliance with medical advice, will check inpatient echocardiogram. -Outpatient OSA  Hypokalemia -  P.o. replacement, recheck level tomorrow  Morbid obesity -BMI= 43, recommend outpatient bariatric evaluation. -Recommend outpatient OSA study  HTN -Continue metoprolol, change p.o. Lasix to IV for today.  Bipolar disorder -Mentation at baseline  DVT prophylaxis: Lovenox Code Status: Full code Family Communication: None at bedside Disposition Plan: Expect less than 2 midnight hospital stay Consults called: None Admission status: Medsurg obs   Lequita Halt MD [facility] Pager [phone]  01/20/2022, 1:40 PM

## 2022-01-20 NOTE — ED Provider Notes (Signed)
[facility] EMERGENCY DEPARTMENT Provider Note   CSN: [id number] Arrival date & time: 01/20/22  0055     History Chief Complaint  Patient presents with   Right Leg Swelling    HPI Lorraine Ryan is a 66 y.o. female presenting for chief complaint of right lower extremity swelling.  She was recently admitted for cellulitis.  She states she has not been taking any antibiotics at home. Leg has been progressively swelling causing severe pain. She is struggling with ambulation because of the pain..   Patient's recorded medical, surgical, social, medication list and allergies were reviewed in the Snapshot window as part of the initial history.   Review of Systems   Review of Systems  Constitutional:  Negative for chills and fever.  HENT:  Negative for ear pain and sore throat.   Eyes:  Negative for pain and visual disturbance.  Respiratory:  Negative for cough and shortness of breath.   Cardiovascular:  Positive for leg swelling. Negative for chest pain and palpitations.  Gastrointestinal:  Negative for abdominal pain and vomiting.  Genitourinary:  Negative for dysuria and hematuria.  Musculoskeletal:  Negative for arthralgias and back pain.  Skin:  Negative for color change and rash.  Neurological:  Negative for seizures and syncope.  All other systems reviewed and are negative.   Physical Exam Updated Vital Signs BP (!) 148/116 (BP Location: Right Arm)   Pulse 95   Temp 99.2 F (37.3 C) (Oral)   Resp 16   Ht '5\' 6"'$  (1.676 m)   Wt 123.8 kg   SpO2 97%   BMI 44.05 kg/m  Physical Exam Vitals and nursing note reviewed.  Constitutional:      General: She is not in acute distress.    Appearance: She is well-developed.  HENT:     Head: Normocephalic and atraumatic.  Eyes:     Conjunctiva/sclera: Conjunctivae normal.  Cardiovascular:     Rate and Rhythm: Normal rate and regular rhythm.     Heart sounds: No murmur heard. Pulmonary:     Effort:  Pulmonary effort is normal. No respiratory distress.     Breath sounds: Normal breath sounds.  Abdominal:     General: There is no distension.     Palpations: Abdomen is soft.     Tenderness: There is no abdominal tenderness. There is no right CVA tenderness or left CVA tenderness.  Musculoskeletal:        General: Tenderness and deformity present. No swelling. Normal range of motion.     Cervical back: Neck supple.     Right lower leg: Edema present.     Left lower leg: No edema.  Skin:    General: Skin is warm and dry.  Neurological:     General: No focal deficit present.     Mental Status: She is alert and oriented to person, place, and time. Mental status is at baseline.     Cranial Nerves: No cranial nerve deficit.      ED Course/ Medical Decision Making/ A&P    Procedures Procedures   Medications Ordered in ED Medications  doxycycline (VIBRA-TABS) tablet 100 mg (100 mg Oral Given 01/21/22 0940)  furosemide (LASIX) tablet 20 mg (20 mg Oral Given 01/21/22 0940)  metoprolol succinate (TOPROL-XL) 24 hr tablet 25 mg (25 mg Oral Given 01/20/22 1715)  rosuvastatin (CRESTOR) tablet 40 mg (40 mg Oral Given 01/21/22 0940)  potassium chloride SA (KLOR-CON M) CR tablet 20 mEq (has no administration in time range)  sodium chloride flush (NS) 0.9 % injection 3 mL (3 mLs Intravenous Given 01/21/22 0940)  sodium chloride flush (NS) 0.9 % injection 3 mL (has no administration in time range)  0.9 %  sodium chloride infusion (has no administration in time range)  acetaminophen (TYLENOL) tablet 650 mg (650 mg Oral Given 01/21/22 1247)  ondansetron (ZOFRAN) injection 4 mg (has no administration in time range)  enoxaparin (LOVENOX) injection 40 mg (40 mg Subcutaneous Given 01/21/22 1247)  cefTRIAXone (ROCEPHIN) 2 g in sodium chloride 0.9 % 100 mL IVPB (2 g Intravenous New Bag/Given 01/21/22 0941)  potassium chloride SA (KLOR-CON M) CR tablet 40 mEq (40 mEq Oral Given 01/20/22 1638)   furosemide (LASIX) injection 40 mg (40 mg Intravenous Given 01/20/22 1719)  cefTRIAXone (ROCEPHIN) 2 g in sodium chloride 0.9 % 100 mL IVPB (0 g Intravenous Stopped 01/20/22 1756)    Medical Decision Making:    Lorraine Ryan is a 66 y.o. female who presented to the ED today with leg pain and swelling detailed above.     Patient's presentation is complicated by their history of multiple medical problems.  Patient placed on continuous vitals and telemetry monitoring while in ED which was reviewed periodically.   Complete initial physical exam performed, notably the patient  was hemodynamically stable in no acute distress.      Reviewed and confirmed nursing documentation for past medical history, family history, social history.    Initial Assessment:   With the patient's presentation of leg swelling, most likely diagnosis is cellulitis versus DVT. Other diagnoses were considered including (but not limited to) arterial ischemia. These are considered less likely due to history of present illness and physical exam findings.   This is most consistent with an acute life/limb threatening illness complicated by underlying chronic conditions.  Initial Plan:  Screening labs including CBC and Metabolic panel to evaluate for infectious or metabolic etiology of disease.  Urinalysis with reflex culture ordered to evaluate for UTI or relevant urologic/nephrologic pathology.  Targeted x-rays lower extremity to eval for osteomyelitis or any other structural injury DVT study in the right lower extremity EKG to evaluate for cardiac pathology. Objective evaluation as below reviewed with plan for close reassessment  Initial Study Results:   Laboratory  All laboratory results reviewed without evidence of clinically relevant pathology.     EKG EKG was reviewed independently. Rate, rhythm, axis, intervals all examined and without medically relevant abnormality. ST segments without concerns for elevations.     Radiology  All images reviewed independently. Agree with radiology report at this time.   ECHOCARDIOGRAM COMPLETE  Result Date: 01/21/2022    ECHOCARDIOGRAM REPORT   Patient Name:   Lorraine Ryan Date of Exam: 01/21/2022 Medical Rec #:  [id number]       Height:       66.0 in Accession #:    [id number]      Weight:       272.9 lb Date of Birth:  May 03, 1955       BSA:          2.282 m Patient Age:    58 years        BP:           148/116 mmHg Patient Gender: F               HR:           80 bpm. Exam Location:  Inpatient Procedure: 2D Echo, Cardiac Doppler and Color Doppler Indications:  CHF-acute diastolic  History:        Patient has prior history of Echocardiogram examinations, most                 recent 02/15/2013. Risk Factors:Hypertension, Dyslipidemia and                 Former Smoker. Hx stroke.  Sonographer:    Clayton Lefort RDCS (AE) Referring Phys: [id number] Lequita Halt  Sonographer Comments: Patient is obese. IMPRESSIONS  1. Left ventricular ejection fraction, by estimation, is 55 to 60%. The left ventricle has normal function. The left ventricle has no regional wall motion abnormalities. There is mild concentric left ventricular hypertrophy. Left ventricular diastolic parameters are consistent with Grade I diastolic dysfunction (impaired relaxation).  2. Right ventricular systolic function is normal. The right ventricular size is normal. Tricuspid regurgitation signal is inadequate for assessing PA pressure.  3. The mitral valve is grossly normal. Trivial mitral valve regurgitation. No evidence of mitral stenosis.  4. The aortic valve is tricuspid. Aortic valve regurgitation is not visualized. No aortic stenosis is present.  5. The inferior vena cava is normal in size with greater than 50% respiratory variability, suggesting right atrial pressure of 3 mmHg. FINDINGS  Left Ventricle: Left ventricular ejection fraction, by estimation, is 55 to 60%. The left ventricle has normal function. The left  ventricle has no regional wall motion abnormalities. The left ventricular internal cavity size was normal in size. There is  mild concentric left ventricular hypertrophy. Left ventricular diastolic parameters are consistent with Grade I diastolic dysfunction (impaired relaxation). Right Ventricle: The right ventricular size is normal. No increase in right ventricular wall thickness. Right ventricular systolic function is normal. Tricuspid regurgitation signal is inadequate for assessing PA pressure. Left Atrium: Left atrial size was normal in size. Right Atrium: Right atrial size was normal in size. Pericardium: Trivial pericardial effusion is present. Mitral Valve: The mitral valve is grossly normal. Trivial mitral valve regurgitation. No evidence of mitral valve stenosis. Tricuspid Valve: The tricuspid valve is grossly normal. Tricuspid valve regurgitation is trivial. No evidence of tricuspid stenosis. Aortic Valve: The aortic valve is tricuspid. Aortic valve regurgitation is not visualized. No aortic stenosis is present. Aortic valve mean gradient measures 2.0 mmHg. Aortic valve peak gradient measures 4.2 mmHg. Aortic valve area, by VTI measures 2.80 cm. Pulmonic Valve: The pulmonic valve was grossly normal. Pulmonic valve regurgitation is not visualized. No evidence of pulmonic stenosis. Aorta: The aortic root and ascending aorta are structurally normal, with no evidence of dilitation. Venous: The inferior vena cava is normal in size with greater than 50% respiratory variability, suggesting right atrial pressure of 3 mmHg. IAS/Shunts: The atrial septum is grossly normal.  LEFT VENTRICLE PLAX 2D LVIDd:         4.40 cm   Diastology LVIDs:         3.10 cm   LV e' medial:    6.42 cm/s LV PW:         1.40 cm   LV E/e' medial:  10.2 LV IVS:        1.30 cm   LV e' lateral:   8.38 cm/s LVOT diam:     2.20 cm   LV E/e' lateral: 7.8 LV SV:         54 LV SV Index:   24 LVOT Area:     3.80 cm  RIGHT VENTRICLE RV Basal  diam:  3.10 cm RV S prime:  13.60 cm/s TAPSE (M-mode): 2.3 cm LEFT ATRIUM             Index        RIGHT ATRIUM           Index LA diam:        2.70 cm 1.18 cm/m   RA Area:     16.60 cm LA Vol (A2C):   32.2 ml 14.11 ml/m  RA Volume:   42.30 ml  18.53 ml/m LA Vol (A4C):   36.0 ml 15.77 ml/m LA Biplane Vol: 37.6 ml 16.47 ml/m  AORTIC VALVE AV Area (Vmax):    2.75 cm AV Area (Vmean):   2.80 cm AV Area (VTI):     2.80 cm AV Vmax:           102.00 cm/s AV Vmean:          70.600 cm/s AV VTI:            0.193 m AV Peak Grad:      4.2 mmHg AV Mean Grad:      2.0 mmHg LVOT Vmax:         73.90 cm/s LVOT Vmean:        52.000 cm/s LVOT VTI:          0.142 m LVOT/AV VTI ratio: 0.74  AORTA Ao Root diam: 3.30 cm Ao Asc diam:  3.40 cm MITRAL VALVE MV Area (PHT): 3.48 cm    SHUNTS MV Decel Time: 218 msec    Systemic VTI:  0.14 m MV E velocity: 65.70 cm/s  Systemic Diam: 2.20 cm MV A velocity: 80.70 cm/s MV E/A ratio:  0.81 Eleonore Chiquito MD Electronically signed by Eleonore Chiquito MD Signature Date/Time: 01/21/2022/11:38:29 AM    Final      Consults:  Case discussed with hospitalist.   Final Assessment and Plan:   Patient with obvious cellulitis is continued further care and management.  Patient arranged for admission with no further acute events.   Disposition:   Based on the above findings, I believe this patient is stable for admission.    Patient/family educated about specific findings on our evaluation and explained exact reasons for admission.  Patient/family educated about clinical situation and time was allowed to answer questions.   Admission team communicated with and agreed with need for admission. Patient admitted. Patient ready to move at this time.     Emergency Department Medication Summary:   Medications  doxycycline (VIBRA-TABS) tablet 100 mg (100 mg Oral Given 01/21/22 0940)  furosemide (LASIX) tablet 20 mg (20 mg Oral Given 01/21/22 0940)  metoprolol succinate (TOPROL-XL) 24 hr tablet  25 mg (25 mg Oral Given 01/20/22 1715)  rosuvastatin (CRESTOR) tablet 40 mg (40 mg Oral Given 01/21/22 0940)  potassium chloride SA (KLOR-CON M) CR tablet 20 mEq (has no administration in time range)  sodium chloride flush (NS) 0.9 % injection 3 mL (3 mLs Intravenous Given 01/21/22 0940)  sodium chloride flush (NS) 0.9 % injection 3 mL (has no administration in time range)  0.9 %  sodium chloride infusion (has no administration in time range)  acetaminophen (TYLENOL) tablet 650 mg (650 mg Oral Given 01/21/22 1247)  ondansetron (ZOFRAN) injection 4 mg (has no administration in time range)  enoxaparin (LOVENOX) injection 40 mg (40 mg Subcutaneous Given 01/21/22 1247)  cefTRIAXone (ROCEPHIN) 2 g in sodium chloride 0.9 % 100 mL IVPB (2 g Intravenous New Bag/Given 01/21/22 0941)  potassium chloride SA (KLOR-CON M) CR tablet 40 mEq (  40 mEq Oral Given 01/20/22 1638)  furosemide (LASIX) injection 40 mg (40 mg Intravenous Given 01/20/22 1719)  cefTRIAXone (ROCEPHIN) 2 g in sodium chloride 0.9 % 100 mL IVPB (0 g Intravenous Stopped 01/20/22 1756)         Clinical Impression:  1. Cellulitis, unspecified cellulitis site      Admit   Final Clinical Impression(s) / ED Diagnoses Final diagnoses:  Cellulitis, unspecified cellulitis site    Rx / DC Orders ED Discharge Orders     None         Tretha Sciara, MD 01/21/22 1512

## 2022-01-20 NOTE — ED Provider Triage Note (Addendum)
Emergency Medicine Provider Triage Evaluation Note  Lorraine Ryan , a 66 y.o. female  was evaluated in triage.  Pt complains of RLE pain and swelling. States she's not taking any abx. She was DC-ed from hospital recently d/t infection of RLE she states no changes since she was discharged from hospital -- continued pain  She has not had any fevers.  Review of Systems  Positive: Leg pain Negative: Fevr   Physical Exam  BP 138/70   Pulse 87   Temp 98 F (36.7 C)   Resp 18   SpO2 96%  Gen:   Awake, no distress   Resp:  Normal effort  MSK:   Moves extremities without difficulty  Other:  Wound RLE, cap refill in toes less than 2 seconds  Medical Decision Making  Medically screening exam initiated at 2:03 AM.  Appropriate orders placed.  Lorraine Ryan was informed that the remainder of the evaluation will be completed by another provider, this initial triage assessment does not replace that evaluation, and the importance of remaining in the ED until their evaluation is complete.  Labs, xrays   Lorraine Ryan, [place] 01/20/22 0204    Lorraine Sias, PA 01/20/22 [phone]

## 2022-01-20 NOTE — ED Notes (Signed)
Report requested for patient nurse on admission unit. Waiting on response before transport.

## 2022-01-20 NOTE — ED Notes (Signed)
IV team at bedside attempting PIV.

## 2022-01-21 ENCOUNTER — Observation Stay (HOSPITAL_BASED_OUTPATIENT_CLINIC_OR_DEPARTMENT_OTHER): Payer: Medicare Other

## 2022-01-21 DIAGNOSIS — L03115 Cellulitis of right lower limb: Secondary | ICD-10-CM | POA: Diagnosis not present

## 2022-01-21 DIAGNOSIS — I5031 Acute diastolic (congestive) heart failure: Secondary | ICD-10-CM

## 2022-01-21 LAB — BASIC METABOLIC PANEL
Anion gap: 9 (ref 5–15)
BUN: 7 mg/dL — ABNORMAL LOW (ref 8–23)
CO2: 26 mmol/L (ref 22–32)
Calcium: 8.9 mg/dL (ref 8.9–10.3)
Chloride: 105 mmol/L (ref 98–111)
Creatinine, Ser: 0.63 mg/dL (ref 0.44–1.00)
GFR, Estimated: >60 mL/min (ref >60)
Glucose, Bld: 86 mg/dL (ref 70–99)
Potassium: 3.9 mmol/L (ref 3.5–5.1)
Sodium: 140 mmol/L (ref 135–145)

## 2022-01-21 LAB — ECHOCARDIOGRAM COMPLETE
AR max vel: 2.75 cm2
AV Area VTI: 2.8 cm2
AV Area mean vel: 2.8 cm2
AV Mean grad: 2 mmHg
AV Peak grad: 4.2 mmHg
Ao pk vel: 1.02 m/s
Area-P 1/2: 3.48 cm2
Height: 66 in
S' Lateral: 3.1 cm
Weight: 4366.87 oz

## 2022-01-21 NOTE — Progress Notes (Signed)
Patient transfer from [facility] to the bed,patient made comfortable in room, bed in low position,call light in reach,vital signs obtained,tele monitor placed on patient and assessment done,will continue to monitor.

## 2022-01-21 NOTE — Evaluation (Signed)
Physical Therapy Evaluation Patient Details Name: Lorraine Ryan MRN: [id number] DOB: 05/04/1955 Today's Date: 01/21/2022  History of Present Illness  66 y.o. female with medical history significant of HTN, stroke, bipolar disorder, schizoaffective disorder, morbid obesity, presented with worsening of right leg swelling pain and rash. Patient was just hospitalized last week for similar presentation, with new onset of right lower extremity rash swelling pain below the knee for 2 days.  Clinical Impression  Pt is moving well without the need for any physical or supervision assistance. Pt currently states that she is in and out of places and would like to go to a motel when she leaves the hospital. Pt has the mobility to navigate her current living situation without significant difficulty. No recommendations for physical therapy following discharge from acute care hospital setting due to pt current functional level.        Recommendations for follow up therapy are one component of a multi-disciplinary discharge planning process, led by the attending physician.  Recommendations may be updated based on patient status, additional functional criteria and insurance authorization.  Follow Up Recommendations No PT follow up      Assistance Recommended at Discharge PRN  Patient can return home with the following       Equipment Recommendations None recommended by PT  Recommendations for Other Services       Functional Status Assessment Patient has not had a recent decline in their functional status     Precautions / Restrictions Precautions Precautions: Fall Restrictions Weight Bearing Restrictions: No      Mobility  Bed Mobility Overal bed mobility: Independent Bed Mobility: Supine to Sit     Supine to sit: Independent     General bed mobility comments: HOB flat no use of rails Patient Response: Cooperative  Transfers Overall transfer level: Independent Equipment used: None                General transfer comment: Pt demonstrates slightly slower movements but stable with no difficulty or LOB    Ambulation/Gait Ambulation/Gait assistance: Independent Gait Distance (Feet): 150 Feet Assistive device: None Gait Pattern/deviations: Step-through pattern, Antalgic, Decreased stance time - right   Gait velocity interpretation: 1.31 - 2.62 ft/sec, indicative of limited community ambulator   General Gait Details: very minimally antalgic gait  Stairs Stairs:  (Pt states that she does not have to navigate stairs.)          Wheelchair Mobility    Modified Rankin (Stroke Patients Only)       Balance Overall balance assessment: Independent   Sitting balance-Leahy Scale: Good     Standing balance support: No upper extremity supported   Standing balance comment: No LOB with functional activity           Pertinent Vitals/Pain Pain Assessment Pain Score: 8     Home Living Family/patient expects to be discharged to:: Shelter/Homeless Living Arrangements: Alone                 Additional Comments: Pt states she has family in the area but she hasn't thought to ask them for help. She states that she is going to stay in a motel when she leaves the hospital and states she has the funds to do this. She states that she goes from place to place. Sisters came to visit while this physical therapist was in the room and seemed concerned pt would be discharged too early. MD/RN and CM were notified of concerns.    Prior Function Prior  Level of Function : Independent/Modified Independent             Mobility Comments: Pt denies use of any AD for ambulation.       Hand Dominance   Dominant Hand: Left    Extremity/Trunk Assessment   Upper Extremity Assessment Upper Extremity Assessment: Overall WFL for tasks assessed    Lower Extremity Assessment Lower Extremity Assessment: RLE deficits/detail;Overall [facility] for tasks assessed RLE Deficits  / Details: pain, rubor and edema in the R lower leg.    Cervical / Trunk Assessment Cervical / Trunk Assessment: Normal  Communication   Communication: No difficulties  Cognition Arousal/Alertness: Awake/alert Behavior During Therapy: WFL for tasks assessed/performed Overall Cognitive Status: Within Functional Limits for tasks assessed           General Comments: pt requires increased time for answering questions and occasional re-direction to tasks/situation               Assessment/Plan    PT Assessment Patient does not need any further PT services         PT Goals (Current goals can be found in the Care Plan section)  Acute Rehab PT Goals Patient Stated Goal: To go to a motel PT Goal Formulation: All assessment and education complete, DC therapy Additional Goals Additional Goal #1: Pt will state understanding for the importance of mobility with swelling and express that she has someone safe she is able to stay and mobilize appropriately.    Frequency  Pt will be discharged from PT services      AM-PAC PT "6 Clicks" Mobility  Outcome Measure Help needed turning from your back to your side while in a flat bed without using bedrails?: None Help needed moving from lying on your back to sitting on the side of a flat bed without using bedrails?: None Help needed moving to and from a bed to a chair (including a wheelchair)?: None Help needed standing up from a chair using your arms (e.g., wheelchair or bedside chair)?: None Help needed to walk in hospital room?: None Help needed climbing 3-5 steps with a railing? : None (not assessed but pt demonstrates the strength and mobility to navigate stairs without significant difficulty) 6 Click Score: 24    End of Session Equipment Utilized During Treatment: Gait belt Activity Tolerance: Patient tolerated treatment well Patient left: in bed;with call bell/phone within reach;with family/visitor present Nurse Communication:  Mobility status;Other (comment) (family requesting to speak with member of the medical team) PT Visit Diagnosis: Difficulty in walking, not elsewhere classified (R26.2);Pain Pain - Right/Left: Right Pain - part of body: Leg    Time: 1050-1107 PT Time Calculation (min) (ACUTE ONLY): 17 min   Charges:   PT Evaluation $PT Eval Low Complexity: [place], DPT, CLT  Acute Rehabilitation Services Office: [phone] (Secure chat preferred)   Ander Purpura 01/21/2022, 11:23 AM

## 2022-01-21 NOTE — Progress Notes (Signed)
Mobility Specialist - Progress Note   01/21/22 1400  Mobility  Activity Ambulated independently to bathroom  Level of Assistance Independent  Assistive Device None  Distance Ambulated (ft) 15 ft  Activity Response Tolerated well  $Mobility charge 1 Mobility    Pt ambulated independently to BR. No physical assistance needed. Left sitting EOB w/ call bell in reach and all needs met.     Mobility Specialist Please contact via SecureChat or Rehab office at [phone]  

## 2022-01-21 NOTE — Progress Notes (Signed)
PROGRESS NOTE    Lorraine Ryan  QQP:[id number] DOB: 05/28/1955 DOA: 01/20/2022 PCP: Eugenia Pancoast, FNP    Brief Narrative:   Lorraine Ryan is a 66 y.o. female with past medical history significant for HTN, history of CVA, bipolar disorder, schizoaffective disorder, morbid obesity who presented to [facility] ED on 12/17 with progressive right lower extremity swelling, rash.  Recently hospitalized for similar presentation with cellulitis, was treated with IV antibiotics x 3 days and discharged home on oral antibiotics.  However patient never went to pick up her antibiotics from her pharmacy claiming that "did not know about any p.o. antibiotics".  She has noticed swelling rash and pain gradually getting worse over the last 3 days.  Denies fever.   In the ED, temperature 98.0 F, HR 87, RR 18, BP 138/70, SpO2 96% on room air.  WBC 6.5, hemoglobin 11.5, platelets 340.  Sodium 139, potassium 3.4, chloride 105, CO2 27, glucose 97, BUN 9, creatinine 0.68.  AST 21, ALT 19, total bilirubin 0.5.  Lactic acid 1.6.  Right ankle x-ray with mild diffuse soft tissue swelling, no fracture, no dislocation or radiopaque foreign body noted.  Vascular duplex ultrasound right lower extremity with no evidence of DVT.  Patient was started on ceftriaxone in the ED.  TRH consulted for admission for further evaluation and management of right lower extremity cellulitis secondary to medical noncompliance outpatient as she did not pick up her antibiotics.  Assessment & Plan:   Right lower extremity cellulitis Patient presenting to the ED with right lower extremity swelling, rash.  Recently discharged on cefadroxil but did not pick up her medications.  Patient is afebrile without leukocytosis.  Right ankle x-ray with mild diffuse soft tissue swelling otherwise unrevealing.  Vascular duplex ultrasound negative for DVT right lower extremity.   -- Ceftriaxone 2 g IV every 24 hours -- Doxycycline 100 mg p.o. every 12  hours  Chronic diastolic congestive heart failure Essential hypertension TTE with LVEF 55 to 60%, no LV regional wall motion normalities, grade 1 diastolic dysfunction, IVC normal in size. -- Metoprolol succinate 25 mg p.o. daily -- Furosemide 20 mg p.o. daily  Hypokalemia Repleted on admission.  Bipolar disorder Schizoaffective disorder Currently not on medication outpatient.  Mentation currently at baseline.  Outpatient follow-up with behavioral health.  History of CVA --Continue Crestor 40 mg p.o. daily.  Morbid obesity Body mass index is 44.05 kg/m.  Discussed with patient needs for aggressive lifestyle changes/weight loss as this complicates all facets of care.  Outpatient follow-up with PCP.    DVT prophylaxis: enoxaparin (LOVENOX) injection 40 mg Start: 01/20/22 1345    Code Status: Full Code Family Communication:   Disposition Plan:  Level of care: Med-Surg Status is: Observation The patient remains OBS appropriate and will d/c before 2 midnights.    Consultants:  None  Procedures:  TTE Vascular duplex ultrasound right lower extremity  Antimicrobials:  Doxycycline 2/17>> Ceftriaxone 2/17>>   Subjective: Patient seen examined bedside, resting comfortably.  Lying in bed.  No family present.  Complaining of right lower extremity swelling, mild pain.  Discussed with patient needs to elevate her extremity due to the swelling secondary to her infection.  Also discussed needs compliance with medications to complete treatment for her infection/cellulitis; as she failed to pick up her oral medications on discharge from previous hospitalization.  No other questions or concerns at this time.  Denies headache, no dizziness, no chest pain, no palpitations, no shortness of breath, no fever/chills/night sweats, no nausea/vomiting/diarrhea, no  focal weakness, no fatigue, no paresthesia.  No acute events overnight per nurse staff.  Objective: Vitals:   01/20/22 2008 01/21/22  0108 01/21/22 0525 01/21/22 0718  BP: 106/84 122/76 134/65 (!) 148/116  Pulse: 81 80 84 95  Resp: '17 18 19 16  '$ Temp: 97.7 F (36.5 C) 98.5 F (36.9 C) 98.9 F (37.2 C) 99.2 F (37.3 C)  TempSrc: Oral Oral Oral Oral  SpO2: 98% 100% 100% 97%  Weight:  123.8 kg    Height:  '5\' 6"'$  (1.676 m)      Intake/Output Summary (Last 24 hours) at 01/21/2022 1732 Last data filed at 01/21/2022 1719 Gross per 24 hour  Intake 220 ml  Output --  Net 220 ml   Filed Weights   01/21/22 0108  Weight: 123.8 kg    Examination:  Physical Exam: GEN: NAD, alert and oriented x 3, obese HEENT: NCAT, PERRL, EOMI, sclera clear, MMM PULM: CTAB w/o wheezes/crackles, normal respiratory effort, on room air CV: RRR w/o M/G/R GI: abd soft, NTND, NABS, no R/G/M MSK: + right lower extremity peripheral edema, muscle strength globally intact 5/5 bilateral upper/lower extremities NEURO: CN II-XII intact, no focal deficits, sensation to light touch intact PSYCH: normal mood/affect Integumentary: Erythema/cellulitic change noted to right lower extremity to mid shin with edema, otherwise no other concerning rashes/lesions/wounds noted on exposed skin surfaces.      Data Reviewed: I have personally reviewed following labs and imaging studies  CBC: Recent Labs  Lab 01/15/22 0220 01/16/22 0259 01/20/22 0210  WBC 11.9* 9.3 6.5  NEUTROABS  --   --  4.0  HGB 11.1* 11.2* 11.5*  HCT 31.6* 32.6* 36.0  MCV 86.3 87.4 92.8  PLT 217 231 417   Basic Metabolic Panel: Recent Labs  Lab 01/14/22 2156 01/15/22 0220 01/16/22 0259 01/17/22 0240 01/20/22 0210 01/21/22 0708  NA  --  137 141 142 139 140  K  --  2.5* 3.1* 3.2* 3.4* 3.9  CL  --  103 107 110 105 105  CO2  --  '24 22 25 27 26  '$ GLUCOSE  --  106* 89 93 97 86  BUN  --  5* 6* 8 9 7*  CREATININE  --  0.78 0.71 0.83 0.68 0.63  CALCIUM  --  8.4* 8.7* 8.6* 8.8* 8.9  MG 2.0 1.9  --   --   --   --   PHOS 2.8 2.8  --   --   --   --    GFR: Estimated  Creatinine Clearance: 92.9 mL/min (by C-G formula based on SCr of 0.63 mg/dL). Liver Function Tests: Recent Labs  Lab 01/14/22 2156 01/15/22 0220 01/20/22 0210  AST 64* 50* 21  ALT 48* 42 19  ALKPHOS 79 70 81  BILITOT 1.1 1.4* 0.5  PROT 7.2 6.3* 7.4  ALBUMIN 2.6* 2.4* 2.7*   No results for input(s): "LIPASE", "AMYLASE" in the last 168 hours. No results for input(s): "AMMONIA" in the last 168 hours. Coagulation Profile: No results for input(s): "INR", "PROTIME" in the last 168 hours. Cardiac Enzymes: Recent Labs  Lab 01/14/22 2156  CKTOTAL 67   BNP (last 3 results) Recent Labs    06/21/21 1152  PROBNP 10.0   HbA1C: No results for input(s): "HGBA1C" in the last 72 hours. CBG: No results for input(s): "GLUCAP" in the last 168 hours. Lipid Profile: No results for input(s): "CHOL", "HDL", "LDLCALC", "TRIG", "CHOLHDL", "LDLDIRECT" in the last 72 hours. Thyroid Function Tests: No results for input(s): "  TSH", "T4TOTAL", "FREET4", "T3FREE", "THYROIDAB" in the last 72 hours. Anemia Panel: No results for input(s): "VITAMINB12", "FOLATE", "FERRITIN", "TIBC", "IRON", "RETICCTPCT" in the last 72 hours. Sepsis Labs: Recent Labs  Lab 01/14/22 1733 01/20/22 0210  LATICACIDVEN 1.8 1.6    Recent Results (from the past 240 hour(s))  Aerobic Culture w Gram Stain (superficial specimen)     Status: None   Collection Time: 01/15/22  7:32 AM   Specimen: Ulcer  Result Value Ref Range Status   Specimen Description ULCER  Final   Special Requests NONE  Final   Gram Stain NO WBC SEEN NO ORGANISMS SEEN   Final   Culture   Final    RARE NORMAL SKIN FLORA Performed at [facility], [place] [address], [place], [place] [address]    Report Status 01/18/2022 FINAL  Final  Blood culture (routine x 2)     Status: None (Preliminary result)   Collection Time: 01/20/22  9:07 AM   Specimen: BLOOD LEFT FOREARM  Result Value Ref Range Status   Specimen Description BLOOD LEFT FOREARM  Final    Special Requests   Final    BOTTLES DRAWN AEROBIC AND ANAEROBIC Blood Culture adequate volume   Culture   Final    NO GROWTH < 24 HOURS Performed at [facility], [place] [address], [place], [place] [address]    Report Status PENDING  Incomplete         Radiology Studies: ECHOCARDIOGRAM COMPLETE  Result Date: 01/21/2022    ECHOCARDIOGRAM REPORT   Patient Name:   BROOKELYNNE DIMPERIO Date of Exam: 01/21/2022 Medical Rec #:  [id number]       Height:       66.0 in Accession #:    [id number]      Weight:       272.9 lb Date of Birth:  1955-10-01       BSA:          2.282 m Patient Age:    70 years        BP:           148/116 mmHg Patient Gender: F               HR:           80 bpm. Exam Location:  Inpatient Procedure: 2D Echo, Cardiac Doppler and Color Doppler Indications:    CHF-acute diastolic  History:        Patient has prior history of Echocardiogram examinations, most                 recent 02/15/2013. Risk Factors:Hypertension, Dyslipidemia and                 Former Smoker. Hx stroke.  Sonographer:    Clayton Lefort RDCS (AE) Referring Phys: [id number] Lequita Halt  Sonographer Comments: Patient is obese. IMPRESSIONS  1. Left ventricular ejection fraction, by estimation, is 55 to 60%. The left ventricle has normal function. The left ventricle has no regional wall motion abnormalities. There is mild concentric left ventricular hypertrophy. Left ventricular diastolic parameters are consistent with Grade I diastolic dysfunction (impaired relaxation).  2. Right ventricular systolic function is normal. The right ventricular size is normal. Tricuspid regurgitation signal is inadequate for assessing PA pressure.  3. The mitral valve is grossly normal. Trivial mitral valve regurgitation. No evidence of mitral stenosis.  4. The aortic valve is tricuspid. Aortic valve regurgitation is not visualized. No aortic stenosis is present.  5. The inferior vena cava is normal in size with greater than 50% respiratory  variability, suggesting right atrial pressure of 3 mmHg. FINDINGS  Left Ventricle: Left ventricular ejection fraction, by estimation, is 55 to 60%. The left ventricle has normal function. The left ventricle has no regional wall motion abnormalities. The left ventricular internal cavity size was normal in size. There is  mild concentric left ventricular hypertrophy. Left ventricular diastolic parameters are consistent with Grade I diastolic dysfunction (impaired relaxation). Right Ventricle: The right ventricular size is normal. No increase in right ventricular wall thickness. Right ventricular systolic function is normal. Tricuspid regurgitation signal is inadequate for assessing PA pressure. Left Atrium: Left atrial size was normal in size. Right Atrium: Right atrial size was normal in size. Pericardium: Trivial pericardial effusion is present. Mitral Valve: The mitral valve is grossly normal. Trivial mitral valve regurgitation. No evidence of mitral valve stenosis. Tricuspid Valve: The tricuspid valve is grossly normal. Tricuspid valve regurgitation is trivial. No evidence of tricuspid stenosis. Aortic Valve: The aortic valve is tricuspid. Aortic valve regurgitation is not visualized. No aortic stenosis is present. Aortic valve mean gradient measures 2.0 mmHg. Aortic valve peak gradient measures 4.2 mmHg. Aortic valve area, by VTI measures 2.80 cm. Pulmonic Valve: The pulmonic valve was grossly normal. Pulmonic valve regurgitation is not visualized. No evidence of pulmonic stenosis. Aorta: The aortic root and ascending aorta are structurally normal, with no evidence of dilitation. Venous: The inferior vena cava is normal in size with greater than 50% respiratory variability, suggesting right atrial pressure of 3 mmHg. IAS/Shunts: The atrial septum is grossly normal.  LEFT VENTRICLE PLAX 2D LVIDd:         4.40 cm   Diastology LVIDs:         3.10 cm   LV e' medial:    6.42 cm/s LV PW:         1.40 cm   LV E/e'  medial:  10.2 LV IVS:        1.30 cm   LV e' lateral:   8.38 cm/s LVOT diam:     2.20 cm   LV E/e' lateral: 7.8 LV SV:         54 LV SV Index:   24 LVOT Area:     3.80 cm  RIGHT VENTRICLE RV Basal diam:  3.10 cm RV S prime:     13.60 cm/s TAPSE (M-mode): 2.3 cm LEFT ATRIUM             Index        RIGHT ATRIUM           Index LA diam:        2.70 cm 1.18 cm/m   RA Area:     16.60 cm LA Vol (A2C):   32.2 ml 14.11 ml/m  RA Volume:   42.30 ml  18.53 ml/m LA Vol (A4C):   36.0 ml 15.77 ml/m LA Biplane Vol: 37.6 ml 16.47 ml/m  AORTIC VALVE AV Area (Vmax):    2.75 cm AV Area (Vmean):   2.80 cm AV Area (VTI):     2.80 cm AV Vmax:           102.00 cm/s AV Vmean:          70.600 cm/s AV VTI:            0.193 m AV Peak Grad:      4.2 mmHg AV Mean Grad:      2.0 mmHg LVOT  Vmax:         73.90 cm/s LVOT Vmean:        52.000 cm/s LVOT VTI:          0.142 m LVOT/AV VTI ratio: 0.74  AORTA Ao Root diam: 3.30 cm Ao Asc diam:  3.40 cm MITRAL VALVE MV Area (PHT): 3.48 cm    SHUNTS MV Decel Time: 218 msec    Systemic VTI:  0.14 m MV E velocity: 65.70 cm/s  Systemic Diam: 2.20 cm MV A velocity: 80.70 cm/s MV E/A ratio:  0.81 Eleonore Chiquito MD Electronically signed by Eleonore Chiquito MD Signature Date/Time: 01/21/2022/11:38:29 AM    Final    VAS [place] LOWER EXTREMITY VENOUS (DVT) (ONLY [facility] & [facility])  Result Date: 01/20/2022  Lower Venous DVT Study Patient Name:  BESAN KETCHEM  Date of Exam:   01/20/2022 Medical Rec #: [id number]        Accession #:    [id number] Date of Birth: 11/06/1955        Patient Gender: F Patient Age:   89 years Exam Location:  [facility] Procedure:      VAS [place] LOWER EXTREMITY VENOUS (DVT) Referring Phys: CHASE COUNTRYMAN --------------------------------------------------------------------------------  Indications: Pain, Swelling, Erythema, and Cellulitis.  Limitations: Body habitus. Comparison Study: Prior negative right LEV done 01/14/22 Performing Technologist: Sharion Dove RVS  Examination  Guidelines: A complete evaluation includes B-mode imaging, spectral Doppler, color Doppler, and power Doppler as needed of all accessible portions of each vessel. Bilateral testing is considered an integral part of a complete examination. Limited examinations for reoccurring indications may be performed as noted. The reflux portion of the exam is performed with the patient in reverse Trendelenburg.  +---------+---------------+---------+-----------+----------+-------------------+ RIGHT    CompressibilityPhasicitySpontaneityPropertiesThrombus Aging      +---------+---------------+---------+-----------+----------+-------------------+ CFV      Full           Yes      Yes                                      +---------+---------------+---------+-----------+----------+-------------------+ SFJ      Full                                                             +---------+---------------+---------+-----------+----------+-------------------+ FV Prox  Full                                                             +---------+---------------+---------+-----------+----------+-------------------+ FV Mid                  Yes      Yes                  patent by color and  Doppler             +---------+---------------+---------+-----------+----------+-------------------+ FV Distal               Yes      Yes                  patent by color and                                                       Doppler             +---------+---------------+---------+-----------+----------+-------------------+ PFV      Full                                                             +---------+---------------+---------+-----------+----------+-------------------+ POP                     Yes      Yes                  patent by color and                                                       Doppler              +---------+---------------+---------+-----------+----------+-------------------+ PTV                     Yes      Yes                  patent by color and                                                       Doppler             +---------+---------------+---------+-----------+----------+-------------------+ PERO                    Yes      Yes                  patent by color and                                                       Doppler             +---------+---------------+---------+-----------+----------+-------------------+   +----+---------------+---------+-----------+----------+--------------+ LEFTCompressibilityPhasicitySpontaneityPropertiesThrombus Aging +----+---------------+---------+-----------+----------+--------------+ CFV Full           Yes      Yes                                 +----+---------------+---------+-----------+----------+--------------+  Summary: RIGHT: - Findings appear essentially unchanged compared to previous examination. - There is no evidence of deep vein thrombosis in the lower extremity.  - No cystic structure found in the popliteal fossa. - Ultrasound characteristics of enlarged lymph nodes are noted in the groin.  LEFT: - No evidence of common femoral vein obstruction.  *See table(s) above for measurements and observations.    Preliminary    DG Tibia/Fibula Right  Result Date: 01/20/2022 CLINICAL DATA:  Wound to right lateral lower extremity EXAM: RIGHT TIBIA AND FIBULA - 2 VIEW COMPARISON:  None Available. FINDINGS: No fracture or dislocation is seen. The joint spaces are preserved. The visualized soft tissues are unremarkable. IMPRESSION: Negative. Electronically Signed   By: Julian Hy M.D.   On: 01/20/2022 02:45   DG Ankle 2 Views Right  Result Date: 01/20/2022 CLINICAL DATA:  Wound to right lateral lower extremity EXAM: RIGHT ANKLE - 2 VIEW COMPARISON:  None Available. FINDINGS: No fracture or dislocation is  seen. The joint spaces are preserved. Mild diffuse soft tissue swelling. No radiopaque foreign body is seen. IMPRESSION: Mild diffuse soft tissue swelling. No fracture, dislocation, or radiopaque foreign body is seen. Electronically Signed   By: Julian Hy M.D.   On: 01/20/2022 02:45        Scheduled Meds:  doxycycline  100 mg Oral Q12H   enoxaparin (LOVENOX) injection  40 mg Subcutaneous Q24H   furosemide  20 mg Oral Daily   metoprolol succinate  25 mg Oral QPM   potassium chloride SA  20 mEq Oral QPM   rosuvastatin  40 mg Oral Daily   sodium chloride flush  3 mL Intravenous Q12H   Continuous Infusions:  sodium chloride     cefTRIAXone (ROCEPHIN)  IV 2 g (01/21/22 0941)     LOS: 0 days    Time spent: 49 minutes spent on chart review, discussion with nursing staff, consultants, updating family and interview/physical exam; more than 50% of that time was spent in counseling and/or coordination of care.    Michiah Mudry J [place] DO [facility] Available via Epic secure chat 7am-7pm After these hours, please refer to coverage provider listed on [email or web address] 01/21/2022, 5:32 PM

## 2022-01-21 NOTE — Progress Notes (Signed)
  Echocardiogram 2D Echocardiogram has been performed.  Lorraine Ryan 01/21/2022, 10:29 AM

## 2022-01-21 NOTE — Social Work (Signed)
CSW was notified by medical team that pt is experiencing homelessness and would like to speak with CSW. CSW was also alerted that sisters wanted to speak with CSW about pt staying in the hospital "Indefinitely". CSW met with pt at bedside, Pt is alert and oriented x4. Pt states she lives "Out on the streets" and has no intention of going to low income housing or living differently. Pt states she makes more money on her own. CSW discussed some resources with pt and she is agreeable to them being added to her AVS. CSW asked if pt would like for CSW to speak with her sisters. She stated no and that her sisters only stayed long enough to start a problem. CSW to respect pt's request and decisions, medical team to be updated. Resources to be added to AVS. Please consult TOC for further needs.

## 2022-01-21 NOTE — Plan of Care (Signed)

## 2022-01-21 NOTE — Progress Notes (Signed)
Heart Failure Navigator Progress Note  Following this hospitalization to assess for HV TOC readiness.   ECHO?  Cellulitis  Earnestine Leys, BSN, Clinical cytogeneticist Only

## 2022-01-22 ENCOUNTER — Other Ambulatory Visit (HOSPITAL_COMMUNITY): Payer: Self-pay

## 2022-01-22 DIAGNOSIS — L03115 Cellulitis of right lower limb: Secondary | ICD-10-CM | POA: Diagnosis not present

## 2022-01-22 MED ORDER — DOXYCYCLINE HYCLATE 100 MG PO TABS
100.0000 mg | ORAL_TABLET | Freq: Two times a day (BID) | ORAL | 0 refills | Status: AC
  Filled 2022-01-22: qty 20, 10d supply, fill #0

## 2022-01-22 MED ORDER — TRAMADOL HCL 50 MG PO TABS
50.0000 mg | ORAL_TABLET | Freq: Four times a day (QID) | ORAL | 0 refills | Status: DC | PRN
  Filled 2022-01-22: qty 15, 4d supply, fill #0

## 2022-01-22 MED ORDER — CEFADROXIL 500 MG PO CAPS
500.0000 mg | ORAL_CAPSULE | Freq: Two times a day (BID) | ORAL | 0 refills | Status: AC
  Filled 2022-01-22 (×2): qty 20, 10d supply, fill #0

## 2022-01-22 NOTE — Progress Notes (Signed)
Heart Failure Navigator Progress Note  Assessed for Heart & Vascular TOC clinic readiness.  Patient does not meet criteria due to admission not Acute CHF, EF 50-55%.   Navigator will sign off at this time.  Earnestine Leys, BSN, Clinical cytogeneticist Only

## 2022-01-22 NOTE — Progress Notes (Signed)
Mobility Specialist - Progress Note   01/22/22 1100  Mobility  Activity Ambulated independently in hallway  Level of Assistance Independent  Assistive Device None  Distance Ambulated (ft) 200 ft  Activity Response Tolerated well  $Mobility charge 1 Mobility    Pt received sitting EOB and agreeable to mobility. C/o throbbing pain in back of LLE. Left in room w/ call bell in reach and all needs met.   [place] Specialist Please contact via SecureChat or Rehab office at [phone]

## 2022-01-22 NOTE — Discharge Summary (Signed)
Physician Discharge Summary  Kalaya Infantino WUJ:[id number] DOB: [birth year] DOA: 01/20/2022  PCP: Eugenia Pancoast, FNP  Admit date: 01/20/2022 Discharge date: 01/22/2022  Admitted From: Home/motel Disposition: Home/motel  Recommendations for Outpatient Follow-up:  Follow up with PCP in 1-2 weeks Please obtain BMP/CBC in one week Please follow up on the following pending results:  Home Health: None Equipment/Devices: None  Discharge Condition: Stable CODE STATUS: Full code Diet recommendation: Heart healthy diet  History of present illness:  Lorraine Ryan is a 66 y.o. female with past medical history significant for HTN, history of CVA, bipolar disorder, schizoaffective disorder, morbid obesity who presented to [facility] ED on 12/17 with progressive right lower extremity swelling, rash.  Recently hospitalized for similar presentation with cellulitis, was treated with IV antibiotics x 3 days and discharged home on oral antibiotics.  However patient never went to pick up her antibiotics from her pharmacy claiming that "did not know about any p.o. antibiotics".  She has noticed swelling rash and pain gradually getting worse over the last 3 days.  Denies fever.    In the ED, temperature 98.0 F, HR 87, RR 18, BP 138/70, SpO2 96% on room air.  WBC 6.5, hemoglobin 11.5, platelets 340.  Sodium 139, potassium 3.4, chloride 105, CO2 27, glucose 97, BUN 9, creatinine 0.68.  AST 21, ALT 19, total bilirubin 0.5.  Lactic acid 1.6.  Right ankle x-ray with mild diffuse soft tissue swelling, no fracture, no dislocation or radiopaque foreign body noted.  Vascular duplex ultrasound right lower extremity with no evidence of DVT.  Patient was started on ceftriaxone in the ED.  TRH consulted for admission for further evaluation and management of right lower extremity cellulitis secondary to medical noncompliance outpatient as she did not pick up her antibiotics.  Hospital course:  Right lower extremity  cellulitis Patient presenting to the ED with right lower extremity swelling, rash.  Recently discharged on cefadroxil but did not pick up her medications.  Patient is afebrile without leukocytosis.  Right ankle x-ray with mild diffuse soft tissue swelling otherwise unrevealing.  Vascular duplex ultrasound negative for DVT right lower extremity.  Patient was started on ceftriaxone and doxycycline and will transition to doxycycline 100 mg p.o. every 12 hours and cefadroxil 500 mg p.o. every 12 hours to complete course of treatment.  Instructed patient to take these medications as prescribed, elevate right lower extremity when in the supine or lying position and antibiotics were sent to [place] to ensure she has this at time of discharge.   Chronic diastolic congestive heart failure Essential hypertension TTE with LVEF 55 to 60%, no LV regional wall motion normalities, grade 1 diastolic dysfunction, IVC normal in size.  Continue metoprolol succinate 25 mg p.o. daily and furosemide 20 mg p.o. daily   Hypokalemia Repleted on admission.   Bipolar disorder Schizoaffective disorder Currently not on medication outpatient.  Mentation currently at baseline.  Outpatient follow-up with behavioral health.   History of CVA Continue Crestor 40 mg p.o. daily.   Morbid obesity Body mass index is 44.05 kg/m.  Discussed with patient needs for aggressive lifestyle changes/weight loss as this complicates all facets of care.  Outpatient follow-up with PCP.   Homelessness Patient was seen by case management/social work and declined resources.  Plan to return to motel on discharge.  Discharge Diagnoses:  Principal Problem:   Cellulitis Active Problems:   Bipolar disorder with psychotic features [facility])   Essential hypertension   Bilateral lower extremity edema    Discharge Instructions  Discharge Instructions     Call MD for:  difficulty breathing, headache or visual disturbances   Complete by: As  directed    Call MD for:  extreme fatigue   Complete by: As directed    Call MD for:  persistant dizziness or light-headedness   Complete by: As directed    Call MD for:  persistant nausea and vomiting   Complete by: As directed    Call MD for:  severe uncontrolled pain   Complete by: As directed    Call MD for:  temperature >100.4   Complete by: As directed    Diet - low sodium heart healthy   Complete by: As directed    Increase activity slowly   Complete by: As directed       Allergies as of 01/22/2022       Reactions   Codeine Other (See Comments)   Headache   Oxycontin [oxycodone] Nausea Only, Palpitations   Prednisone Other (See Comments)   Headache   Ultram [tramadol] Nausea Only        Medication List     TAKE these medications    cefadroxil 500 MG capsule Commonly known as: DURICEF Take 1 capsule (500 mg total) by mouth 2 (two) times daily for 10 days.   doxycycline 100 MG tablet Commonly known as: VIBRA-TABS Take 1 tablet (100 mg total) by mouth every 12 (twelve) hours for 10 days.   furosemide 20 MG tablet Commonly known as: LASIX Take 1 tablet (20 mg total) by mouth daily.   metoprolol succinate 25 MG 24 hr tablet Commonly known as: Toprol XL Take 1 tablet (25 mg total) by mouth daily. What changed: when to take this   potassium chloride SA 20 MEQ tablet Commonly known as: Klor-Con M20 Take 1 tablet (20 mEq total) by mouth every evening.   rosuvastatin 40 MG tablet Commonly known as: CRESTOR TAKE 1 TABLET BY MOUTH DAILY What changed: when to take this   traMADol 50 MG tablet Commonly known as: Ultram Take 1 tablet (50 mg total) by mouth every 6 (six) hours as needed for moderate pain.        Follow-up Information     Eugenia Pancoast, FNP. Schedule an appointment as soon as possible for a visit in 1 week(s).   Specialty: Family Medicine Contact information: [place] [address] [phone]                 Allergies  Allergen Reactions   Codeine Other (See Comments)    Headache   Oxycontin [Oxycodone] Nausea Only and Palpitations   Prednisone Other (See Comments)    Headache   Ultram [Tramadol] Nausea Only    Consultations: None   Procedures/Studies: VAS [place] LOWER EXTREMITY VENOUS (DVT) (ONLY [facility] & [facility])  Result Date: 01/21/2022  Lower Venous DVT Study [name]  Date of Exam:   01/20/2022 Medical Rec #: [id number]        Accession #:    [id number] Date of Birth: Oct 13, 1955        Patient Gender: F Patient Age:   52 years Exam Location:  [facility] Procedure:      VAS [place] LOWER EXTREMITY VENOUS (DVT) Referring Phys: CHASE COUNTRYMAN --------------------------------------------------------------------------------  Indications: Pain, Swelling, Erythema, and Cellulitis.  Limitations: Body habitus. Comparison Study: Prior negative right LEV done 01/14/22 Performing Technologist: Sharion Dove RVS  Examination Guidelines: A complete evaluation includes B-mode imaging, spectral Doppler, color  Doppler, and power Doppler as needed of all accessible portions of each vessel. Bilateral testing is considered an integral part of a complete examination. Limited examinations for reoccurring indications may be performed as noted. The reflux portion of the exam is performed with the patient in reverse Trendelenburg.  +---------+---------------+---------+-----------+----------+-------------------+ RIGHT    CompressibilityPhasicitySpontaneityPropertiesThrombus Aging      +---------+---------------+---------+-----------+----------+-------------------+ CFV      Full           Yes      Yes                                      +---------+---------------+---------+-----------+----------+-------------------+ SFJ      Full                                                             +---------+---------------+---------+-----------+----------+-------------------+ FV Prox   Full                                                             +---------+---------------+---------+-----------+----------+-------------------+ FV Mid                  Yes      Yes                  patent by color and                                                       Doppler             +---------+---------------+---------+-----------+----------+-------------------+ FV Distal               Yes      Yes                  patent by color and                                                       Doppler             +---------+---------------+---------+-----------+----------+-------------------+ PFV      Full                                                             +---------+---------------+---------+-----------+----------+-------------------+ POP                     Yes      Yes                  patent by  color and                                                       Doppler             +---------+---------------+---------+-----------+----------+-------------------+ PTV                     Yes      Yes                  patent by color and                                                       Doppler             +---------+---------------+---------+-----------+----------+-------------------+ PERO                    Yes      Yes                  patent by color and                                                       Doppler             +---------+---------------+---------+-----------+----------+-------------------+   +----+---------------+---------+-----------+----------+--------------+ LEFTCompressibilityPhasicitySpontaneityPropertiesThrombus Aging +----+---------------+---------+-----------+----------+--------------+ CFV Full           Yes      Yes                                 +----+---------------+---------+-----------+----------+--------------+     Summary: RIGHT: - Findings appear essentially unchanged  compared to previous examination. - There is no evidence of deep vein thrombosis in the lower extremity.  - No cystic structure found in the popliteal fossa. - Ultrasound characteristics of enlarged lymph nodes are noted in the groin.  LEFT: - No evidence of common femoral vein obstruction.  *See table(s) above for measurements and observations. Electronically signed by Orlie Pollen on 01/21/2022 at 5:33:32 PM.    Final    ECHOCARDIOGRAM COMPLETE  Result Date: 01/21/2022    ECHOCARDIOGRAM REPORT   Patient Name:   Lorraine Ryan Date of Exam: 01/21/2022 Medical Rec #:  [id number]       Height:       66.0 in Accession #:    [id number]      Weight:       272.9 lb Date of Birth:  October 26, 1955       BSA:          2.282 m Patient Age:    61 years        BP:           148/116 mmHg Patient Gender: F               HR:           80 bpm. Exam Location:  Inpatient Procedure: 2D Echo, Cardiac Doppler and  Color Doppler Indications:    CHF-acute diastolic  History:        Patient has prior history of Echocardiogram examinations, most                 recent 02/15/2013. Risk Factors:Hypertension, Dyslipidemia and                 Former Smoker. Hx stroke.  Sonographer:    Clayton Lefort RDCS (AE) Referring Phys: [id number] Lequita Halt  Sonographer Comments: Patient is obese. IMPRESSIONS  1. Left ventricular ejection fraction, by estimation, is 55 to 60%. The left ventricle has normal function. The left ventricle has no regional wall motion abnormalities. There is mild concentric left ventricular hypertrophy. Left ventricular diastolic parameters are consistent with Grade I diastolic dysfunction (impaired relaxation).  2. Right ventricular systolic function is normal. The right ventricular size is normal. Tricuspid regurgitation signal is inadequate for assessing PA pressure.  3. The mitral valve is grossly normal. Trivial mitral valve regurgitation. No evidence of mitral stenosis.  4. The aortic valve is tricuspid. Aortic valve  regurgitation is not visualized. No aortic stenosis is present.  5. The inferior vena cava is normal in size with greater than 50% respiratory variability, suggesting right atrial pressure of 3 mmHg. FINDINGS  Left Ventricle: Left ventricular ejection fraction, by estimation, is 55 to 60%. The left ventricle has normal function. The left ventricle has no regional wall motion abnormalities. The left ventricular internal cavity size was normal in size. There is  mild concentric left ventricular hypertrophy. Left ventricular diastolic parameters are consistent with Grade I diastolic dysfunction (impaired relaxation). Right Ventricle: The right ventricular size is normal. No increase in right ventricular wall thickness. Right ventricular systolic function is normal. Tricuspid regurgitation signal is inadequate for assessing PA pressure. Left Atrium: Left atrial size was normal in size. Right Atrium: Right atrial size was normal in size. Pericardium: Trivial pericardial effusion is present. Mitral Valve: The mitral valve is grossly normal. Trivial mitral valve regurgitation. No evidence of mitral valve stenosis. Tricuspid Valve: The tricuspid valve is grossly normal. Tricuspid valve regurgitation is trivial. No evidence of tricuspid stenosis. Aortic Valve: The aortic valve is tricuspid. Aortic valve regurgitation is not visualized. No aortic stenosis is present. Aortic valve mean gradient measures 2.0 mmHg. Aortic valve peak gradient measures 4.2 mmHg. Aortic valve area, by VTI measures 2.80 cm. Pulmonic Valve: The pulmonic valve was grossly normal. Pulmonic valve regurgitation is not visualized. No evidence of pulmonic stenosis. Aorta: The aortic root and ascending aorta are structurally normal, with no evidence of dilitation. Venous: The inferior vena cava is normal in size with greater than 50% respiratory variability, suggesting right atrial pressure of 3 mmHg. IAS/Shunts: The atrial septum is grossly normal.  LEFT  VENTRICLE PLAX 2D LVIDd:         4.40 cm   Diastology LVIDs:         3.10 cm   LV e' medial:    6.42 cm/s LV PW:         1.40 cm   LV E/e' medial:  10.2 LV IVS:        1.30 cm   LV e' lateral:   8.38 cm/s LVOT diam:     2.20 cm   LV E/e' lateral: 7.8 LV SV:         54 LV SV Index:   24 LVOT Area:     3.80 cm  RIGHT VENTRICLE RV Basal diam:  3.10 cm RV  S prime:     13.60 cm/s TAPSE (M-mode): 2.3 cm LEFT ATRIUM             Index        RIGHT ATRIUM           Index LA diam:        2.70 cm 1.18 cm/m   RA Area:     16.60 cm LA Vol (A2C):   32.2 ml 14.11 ml/m  RA Volume:   42.30 ml  18.53 ml/m LA Vol (A4C):   36.0 ml 15.77 ml/m LA Biplane Vol: 37.6 ml 16.47 ml/m  AORTIC VALVE AV Area (Vmax):    2.75 cm AV Area (Vmean):   2.80 cm AV Area (VTI):     2.80 cm AV Vmax:           102.00 cm/s AV Vmean:          70.600 cm/s AV VTI:            0.193 m AV Peak Grad:      4.2 mmHg AV Mean Grad:      2.0 mmHg LVOT Vmax:         73.90 cm/s LVOT Vmean:        52.000 cm/s LVOT VTI:          0.142 m LVOT/AV VTI ratio: 0.74  AORTA Ao Root diam: 3.30 cm Ao Asc diam:  3.40 cm MITRAL VALVE MV Area (PHT): 3.48 cm    SHUNTS MV Decel Time: 218 msec    Systemic VTI:  0.14 m MV E velocity: 65.70 cm/s  Systemic Diam: 2.20 cm MV A velocity: 80.70 cm/s MV E/A ratio:  0.81 Eleonore Chiquito MD Electronically signed by Eleonore Chiquito MD Signature Date/Time: 01/21/2022/11:38:29 AM    Final    DG Tibia/Fibula Right  Result Date: 01/20/2022 CLINICAL DATA:  Wound to right lateral lower extremity EXAM: RIGHT TIBIA AND FIBULA - 2 VIEW COMPARISON:  None Available. FINDINGS: No fracture or dislocation is seen. The joint spaces are preserved. The visualized soft tissues are unremarkable. IMPRESSION: Negative. Electronically Signed   By: Julian Hy M.D.   On: 01/20/2022 02:45   DG Ankle 2 Views Right  Result Date: 01/20/2022 CLINICAL DATA:  Wound to right lateral lower extremity EXAM: RIGHT ANKLE - 2 VIEW COMPARISON:  None Available.  FINDINGS: No fracture or dislocation is seen. The joint spaces are preserved. Mild diffuse soft tissue swelling. No radiopaque foreign body is seen. IMPRESSION: Mild diffuse soft tissue swelling. No fracture, dislocation, or radiopaque foreign body is seen. Electronically Signed   By: Julian Hy M.D.   On: 01/20/2022 02:45   DG Tibia/Fibula Right  Result Date: 01/14/2022 CLINICAL DATA:  Right leg swelling for 2 days. EXAM: RIGHT TIBIA AND FIBULA - 2 VIEW COMPARISON:  Correlation with right foot radiographs 08/29/2021 FINDINGS: Mildly decreased bone mineralization. Mild-to-moderate medial compartment of the knee joint space narrowing and peripheral osteophytosis. Mild lateral compartment of the knee peripheral osteophytosis. Mild-to-moderate patellofemoral joint space narrowing. Mild superior and inferior patellar degenerative osteophytes. No knee joint effusion. No acute fracture or dislocation. Moderate enthesopathic spurring at the Achilles insertion on the calcaneus. Moderate dorsal talonavicular degenerative osteophytosis. IMPRESSION: Mild-to-moderate medial compartment and patellofemoral compartment osteoarthritis. Electronically Signed   By: Yvonne Kendall M.D.   On: 01/14/2022 18:30   VAS [place] LOWER EXTREMITY VENOUS (DVT) (7a-7p)  Result Date: 01/14/2022  Lower Venous DVT Study [name]  Date of Exam:  01/14/2022 Medical Rec #: [id number]        Accession #:    [id number] Date of Birth: December 26, 1955        Patient Gender: F Patient Age:   19 years Exam Location:  [facility] Procedure:      VAS [place] LOWER EXTREMITY VENOUS (DVT) Referring Phys: Dorise Bullion --------------------------------------------------------------------------------  Indications: Pain.  Limitations: Body habitus and poor ultrasound/tissue interface. Comparison Study: no prior Performing Technologist: Archie Patten RVS  Examination Guidelines: A complete evaluation includes B-mode imaging,  spectral Doppler, color Doppler, and power Doppler as needed of all accessible portions of each vessel. Bilateral testing is considered an integral part of a complete examination. Limited examinations for reoccurring indications may be performed as noted. The reflux portion of the exam is performed with the patient in reverse Trendelenburg.  +--------+---------------+---------+-----------+----------+--------------------+ RIGHT   CompressibilityPhasicitySpontaneityPropertiesThrombus Aging       +--------+---------------+---------+-----------+----------+--------------------+ CFV     Full           Yes      Yes                                       +--------+---------------+---------+-----------+----------+--------------------+ SFJ     Full                                                              +--------+---------------+---------+-----------+----------+--------------------+ FV Prox Full                                                              +--------+---------------+---------+-----------+----------+--------------------+ FV Mid                 Yes      Yes                  patent by color                                                           doppler              +--------+---------------+---------+-----------+----------+--------------------+ FV                     Yes      Yes                  patent by color      Distal                                               doppler              +--------+---------------+---------+-----------+----------+--------------------+ PFV     Full                                                              +--------+---------------+---------+-----------+----------+--------------------+  POP     Full           Yes      Yes                                       +--------+---------------+---------+-----------+----------+--------------------+ PTV                    Yes      Yes                  patent  by color                                                           doppler              +--------+---------------+---------+-----------+----------+--------------------+ PERO                   Yes      Yes                  patent by color                                                           doppler              +--------+---------------+---------+-----------+----------+--------------------+   +----+---------------+---------+-----------+----------+--------------+ LEFTCompressibilityPhasicitySpontaneityPropertiesThrombus Aging +----+---------------+---------+-----------+----------+--------------+ CFV Full           Yes      Yes                                 +----+---------------+---------+-----------+----------+--------------+     Summary: RIGHT: - There is no evidence of deep vein thrombosis in the lower extremity. However, portions of this examination were limited- see technologist comments above.  - No cystic structure found in the popliteal fossa.  LEFT: - No evidence of common femoral vein obstruction.  *See table(s) above for measurements and observations. Electronically signed by Monica Martinez MD on 01/14/2022 at 4:29:20 PM.    Final      Subjective: Patient seen examined bedside, resting calmly.  Lying in bed.  No family present.  Continues with swelling and erythema to right lower extremity which is stable.  Discussed with patient need to comply with her antibiotics on discharge to ensure resolution of her cellulitis.  Also instructed to maintain elevation of the right lower extremity while in a lying or sitting position.  Patient was seen by social work/case management yesterday and declined any resources and plan to return to the motel on discharge.  No other questions or concerns at this time.  Denies headache, no dizziness, no chest pain, no palpitations, no shortness of breath, no fever/chills/night sweats, no nausea/vomiting/diarrhea, no focal weakness,  no fatigue, no paresthesias.  No acute events overnight per nursing staff.  Discharge Exam: Vitals:   01/22/22 0602 01/22/22 0801  BP: 134/77 134/71  Pulse: 79 68  Resp: 17 16  Temp: 98.7 F (37.1 C) 98.5 F (36.9 C)  SpO2: 97% 99%   Vitals:   01/21/22 1739 01/21/22 2055 01/22/22 0602 01/22/22 0801  BP: 122/63 117/73 134/77 134/71  Pulse:  79 79 68  Resp:   17 16  Temp:  98.3 F (36.8 C) 98.7 F (37.1 C) 98.5 F (36.9 C)  TempSrc:  Oral Oral Oral  SpO2:  96% 97% 99%  Weight:   123.5 kg   Height:        Physical Exam: GEN: NAD, alert and oriented x 3, obese HEENT: NCAT, PERRL, EOMI, sclera clear, MMM PULM: CTAB w/o wheezes/crackles, normal respiratory effort, on room air CV: RRR w/o M/G/R GI: abd soft, NTND, NABS, no R/G/M MSK: no peripheral edema, muscle strength globally intact 5/5 bilateral upper/lower extremities NEURO: CN II-XII intact, no focal deficits, sensation to light touch intact PSYCH: normal mood/affect Integumentary: Erythema/cellulitic change noted to right lower extremity to mid shin with edema, otherwise no other concerning rashes/lesions/wounds noted on exposed skin surfaces.      The results of significant diagnostics from this hospitalization (including imaging, microbiology, ancillary and laboratory) are listed below for reference.     Microbiology: Recent Results (from the past 240 hour(s))  Aerobic Culture w Gram Stain (superficial specimen)     Status: None   Collection Time: 01/15/22  7:32 AM   Specimen: Ulcer  Result Value Ref Range Status   Specimen Description ULCER  Final   Special Requests NONE  Final   Gram Stain NO WBC SEEN NO ORGANISMS SEEN   Final   Culture   Final    RARE NORMAL SKIN FLORA Performed at [facility], [address] [place], [place] [address]    Report Status 01/18/2022 FINAL  Final  Blood culture (routine x 2)     Status: None (Preliminary result)   Collection Time: 01/20/22  9:07 AM   Specimen:  BLOOD LEFT FOREARM  Result Value Ref Range Status   Specimen Description BLOOD LEFT FOREARM  Final   Special Requests   Final    BOTTLES DRAWN AEROBIC AND ANAEROBIC Blood Culture adequate volume   Culture   Final    NO GROWTH 2 DAYS Performed at [facility], [place] [address] [place], [place] [address]    Report Status PENDING  Incomplete     Labs: BNP (last 3 results) Recent Labs    04/16/21 2056 11/28/21 1315  BNP 15.4 99.8   Basic Metabolic Panel: Recent Labs  Lab 01/16/22 0259 01/17/22 0240 01/20/22 0210 01/21/22 0708  NA 141 142 139 140  K 3.1* 3.2* 3.4* 3.9  CL 107 110 105 105  CO2 '22 25 27 26  '$ GLUCOSE 89 93 97 86  BUN 6* 8 9 7*  CREATININE 0.71 0.83 0.68 0.63  CALCIUM 8.7* 8.6* 8.8* 8.9   Liver Function Tests: Recent Labs  Lab 01/20/22 0210  AST 21  ALT 19  ALKPHOS 81  BILITOT 0.5  PROT 7.4  ALBUMIN 2.7*   No results for input(s): "LIPASE", "AMYLASE" in the last 168 hours. No results for input(s): "AMMONIA" in the last 168 hours. CBC: Recent Labs  Lab 01/16/22 0259 01/20/22 0210  WBC 9.3 6.5  NEUTROABS  --  4.0  HGB 11.2* 11.5*  HCT 32.6* 36.0  MCV 87.4 92.8  PLT 231 340   Cardiac Enzymes: No results for input(s): "CKTOTAL", "CKMB", "CKMBINDEX", "TROPONINI" in the last 168 hours. BNP: Invalid input(s): "POCBNP" CBG: No results for input(s): "GLUCAP" in the last 168 hours. D-Dimer No  results for input(s): "DDIMER" in the last 72 hours. Hgb A1c No results for input(s): "HGBA1C" in the last 72 hours. Lipid Profile No results for input(s): "CHOL", "HDL", "LDLCALC", "TRIG", "CHOLHDL", "LDLDIRECT" in the last 72 hours. Thyroid function studies No results for input(s): "TSH", "T4TOTAL", "T3FREE", "THYROIDAB" in the last 72 hours.  Invalid input(s): "FREET3" Anemia work up No results for input(s): "VITAMINB12", "FOLATE", "FERRITIN", "TIBC", "IRON", "RETICCTPCT" in the last 72 hours. Urinalysis    Component Value Date/Time    COLORURINE YELLOW 01/15/2022 0410   APPEARANCEUR CLOUDY (A) 01/15/2022 0410   APPEARANCEUR Clear 06/15/2018 1000   LABSPEC 1.013 01/15/2022 0410   PHURINE 6.0 01/15/2022 0410   GLUCOSEU NEGATIVE 01/15/2022 0410   HGBUR SMALL (A) 01/15/2022 0410   BILIRUBINUR NEGATIVE 01/15/2022 0410   BILIRUBINUR Negative 06/21/2021 1152   BILIRUBINUR Negative 06/15/2018 1000   KETONESUR NEGATIVE 01/15/2022 0410   PROTEINUR 30 (A) 01/15/2022 0410   UROBILINOGEN 0.2 06/21/2021 1152   UROBILINOGEN 1 07/05/2014 1103   NITRITE NEGATIVE 01/15/2022 0410   LEUKOCYTESUR NEGATIVE 01/15/2022 0410   Sepsis Labs Recent Labs  Lab 01/16/22 0259 01/20/22 0210  WBC 9.3 6.5   Microbiology Recent Results (from the past 240 hour(s))  Aerobic Culture w Gram Stain (superficial specimen)     Status: None   Collection Time: 01/15/22  7:32 AM   Specimen: Ulcer  Result Value Ref Range Status   Specimen Description ULCER  Final   Special Requests NONE  Final   Gram Stain NO WBC SEEN NO ORGANISMS SEEN   Final   Culture   Final    RARE NORMAL SKIN FLORA Performed at [facility], [address], [place], [place] [address]    Report Status 01/18/2022 FINAL  Final  Blood culture (routine x 2)     Status: None (Preliminary result)   Collection Time: 01/20/22  9:07 AM   Specimen: BLOOD LEFT FOREARM  Result Value Ref Range Status   Specimen Description BLOOD LEFT FOREARM  Final   Special Requests   Final    BOTTLES DRAWN AEROBIC AND ANAEROBIC Blood Culture adequate volume   Culture   Final    NO GROWTH 2 DAYS Performed at [facility], [place] [address] [place], [place] [address]    Report Status PENDING  Incomplete     Time coordinating discharge: Over 30 minutes  SIGNED:   Donnamarie Poag [place] DO  [facility] 01/22/2022, 11:14 AM

## 2022-01-22 NOTE — Discharge Instructions (Signed)
Keep right lower extremity elevated while lying in bed or sitting in a chair

## 2022-01-23 ENCOUNTER — Telehealth: Payer: Self-pay

## 2022-01-23 NOTE — Telephone Encounter (Signed)
Transition Care Management Follow-up Telephone Call Date of [facility] and [facility] where[facility] How have you been since you were released from the hospital? better Any questions or concerns? No  Items Reviewed: Did the pt receive and understand the discharge instructions provided? Yes  Medications obtained and verified? Yes  Other? No  Any new allergies since your discharge? No  Dietary orders reviewed? Yes Do you have support at home? Yes   Home Care and Equipment/Supplies: Were home health services ordered? no If so, what is the name of the agency? N/a  Has the agency set up a time to come to the patient's home? no Were any new equipment or medical supplies ordered?  No What is the name of the medical supply agency? N/a Were you able to get the supplies/equipment? not applicable Do you have any questions related to the use of the equipment or supplies? No  Functional Questionnaire: (I = Independent and D = Dependent) ADLs: I  Bathing/Dressing- I  Meal Prep- I  Eating- I  Maintaining continence- I  Transferring/Ambulation- I  Managing Meds- I  Follow up appointments reviewed:  PCP Hospital f/u appt confirmed? Yes  Scheduled to see Dr Carlis Abbott on 01/24/2022 @ 10:20. [facility] f/u appt confirmed? No  Are transportation arrangements needed? No  If their condition worsens, is the pt aware to call PCP or go to the Emergency Dept.? Yes Was the patient provided with contact information for the PCP's office or ED? Yes Was to pt encouraged to call back with questions or concerns? Yes  Juanda Crumble, LPN [facility] [phone]

## 2022-01-24 ENCOUNTER — Inpatient Hospital Stay: Payer: Medicare Other | Admitting: Primary Care

## 2022-01-25 LAB — CULTURE, BLOOD (ROUTINE X 2)
Culture: NO GROWTH
Special Requests: ADEQUATE

## 2022-02-07 DIAGNOSIS — I10 Essential (primary) hypertension: Secondary | ICD-10-CM | POA: Diagnosis not present

## 2022-02-15 ENCOUNTER — Ambulatory Visit: Payer: Medicare Other | Admitting: Family

## 2022-02-19 ENCOUNTER — Other Ambulatory Visit: Payer: Self-pay | Admitting: Family

## 2022-02-19 DIAGNOSIS — I10 Essential (primary) hypertension: Secondary | ICD-10-CM

## 2022-02-22 DIAGNOSIS — I700 Atherosclerosis of aorta: Secondary | ICD-10-CM | POA: Diagnosis not present

## 2022-02-22 DIAGNOSIS — Z131 Encounter for screening for diabetes mellitus: Secondary | ICD-10-CM | POA: Diagnosis not present

## 2022-02-22 DIAGNOSIS — Z0000 Encounter for general adult medical examination without abnormal findings: Secondary | ICD-10-CM | POA: Diagnosis not present

## 2022-02-22 DIAGNOSIS — I509 Heart failure, unspecified: Secondary | ICD-10-CM | POA: Diagnosis not present

## 2022-02-22 DIAGNOSIS — I10 Essential (primary) hypertension: Secondary | ICD-10-CM | POA: Diagnosis not present

## 2022-02-22 DIAGNOSIS — Z79899 Other long term (current) drug therapy: Secondary | ICD-10-CM | POA: Diagnosis not present

## 2022-03-06 ENCOUNTER — Other Ambulatory Visit: Payer: Self-pay | Admitting: Family

## 2022-03-06 DIAGNOSIS — N3000 Acute cystitis without hematuria: Secondary | ICD-10-CM

## 2022-03-06 DIAGNOSIS — R0609 Other forms of dyspnea: Secondary | ICD-10-CM

## 2022-03-06 DIAGNOSIS — R600 Localized edema: Secondary | ICD-10-CM

## 2022-03-20 ENCOUNTER — Other Ambulatory Visit (HOSPITAL_COMMUNITY): Payer: Self-pay

## 2022-04-17 ENCOUNTER — Other Ambulatory Visit: Payer: Self-pay | Admitting: Family

## 2022-04-17 DIAGNOSIS — I10 Essential (primary) hypertension: Secondary | ICD-10-CM

## 2022-04-18 ENCOUNTER — Ambulatory Visit: Payer: Self-pay | Admitting: Internal Medicine

## 2022-04-18 ENCOUNTER — Encounter: Payer: Self-pay | Admitting: Family

## 2022-04-30 ENCOUNTER — Other Ambulatory Visit: Payer: Self-pay

## 2022-05-02 ENCOUNTER — Other Ambulatory Visit: Payer: Self-pay

## 2022-05-02 ENCOUNTER — Ambulatory Visit: Payer: Medicaid Other | Admitting: Family

## 2022-05-03 ENCOUNTER — Other Ambulatory Visit: Payer: Self-pay | Admitting: Family

## 2022-05-03 DIAGNOSIS — I10 Essential (primary) hypertension: Secondary | ICD-10-CM

## 2022-05-08 ENCOUNTER — Encounter: Payer: Self-pay | Admitting: Family

## 2022-05-08 ENCOUNTER — Telehealth: Payer: Self-pay | Admitting: Family

## 2022-05-08 NOTE — Telephone Encounter (Signed)
Mychart message was sent to have patient schedule an appt before this refill can be sent.

## 2022-05-08 NOTE — Telephone Encounter (Signed)
Prescription Request  05/08/2022  LOV: 07/03/2021  What is the name of the medication or equipment? metoprolol succinate (TOPROL XL) 25 MG 24 hr tablet    Pain medication (she wasn't sure what the med name is)  Have you contacted your pharmacy to request a refill? No   Which pharmacy would you like this sent to?   [id number] [address], [place] [address] [place] [address] Phone: [phone] Fax: [phone]   Patient notified that their request is being sent to the clinical staff for review and that they should receive a response within 2 business days.   Please advise at Mobile [phone] (mobile)

## 2022-05-16 ENCOUNTER — Other Ambulatory Visit (HOSPITAL_COMMUNITY): Payer: Self-pay

## 2022-05-16 ENCOUNTER — Other Ambulatory Visit: Payer: Self-pay

## 2022-05-27 ENCOUNTER — Other Ambulatory Visit (HOSPITAL_COMMUNITY): Payer: Self-pay

## 2022-06-13 ENCOUNTER — Other Ambulatory Visit: Payer: Self-pay | Admitting: Family

## 2022-06-13 DIAGNOSIS — I10 Essential (primary) hypertension: Secondary | ICD-10-CM

## 2022-06-19 ENCOUNTER — Other Ambulatory Visit: Payer: Self-pay | Admitting: Family

## 2022-06-19 ENCOUNTER — Other Ambulatory Visit: Payer: Self-pay | Admitting: Podiatry

## 2022-06-19 ENCOUNTER — Encounter: Payer: Self-pay | Admitting: Family

## 2022-06-19 ENCOUNTER — Ambulatory Visit (INDEPENDENT_AMBULATORY_CARE_PROVIDER_SITE_OTHER): Payer: Medicare Other | Admitting: Family

## 2022-06-19 VITALS — BP 160/90 | HR 80 | Temp 98.0°F | Ht 66.0 in | Wt 293.4 lb

## 2022-06-19 DIAGNOSIS — R0609 Other forms of dyspnea: Secondary | ICD-10-CM

## 2022-06-19 DIAGNOSIS — R7303 Prediabetes: Secondary | ICD-10-CM | POA: Diagnosis not present

## 2022-06-19 DIAGNOSIS — I10 Essential (primary) hypertension: Secondary | ICD-10-CM

## 2022-06-19 DIAGNOSIS — D649 Anemia, unspecified: Secondary | ICD-10-CM

## 2022-06-19 DIAGNOSIS — R600 Localized edema: Secondary | ICD-10-CM | POA: Diagnosis not present

## 2022-06-19 DIAGNOSIS — L309 Dermatitis, unspecified: Secondary | ICD-10-CM | POA: Diagnosis not present

## 2022-06-19 DIAGNOSIS — R7989 Other specified abnormal findings of blood chemistry: Secondary | ICD-10-CM | POA: Insufficient documentation

## 2022-06-19 DIAGNOSIS — Z91199 Patient's noncompliance with other medical treatment and regimen due to unspecified reason: Secondary | ICD-10-CM | POA: Diagnosis not present

## 2022-06-19 DIAGNOSIS — R311 Benign essential microscopic hematuria: Secondary | ICD-10-CM | POA: Diagnosis not present

## 2022-06-19 DIAGNOSIS — E785 Hyperlipidemia, unspecified: Secondary | ICD-10-CM

## 2022-06-19 DIAGNOSIS — R440 Auditory hallucinations: Secondary | ICD-10-CM

## 2022-06-19 DIAGNOSIS — E876 Hypokalemia: Secondary | ICD-10-CM

## 2022-06-19 DIAGNOSIS — I493 Ventricular premature depolarization: Secondary | ICD-10-CM | POA: Diagnosis not present

## 2022-06-19 LAB — COMPREHENSIVE METABOLIC PANEL
ALT: 23 U/L (ref 0–35)
AST: 24 U/L (ref 0–37)
Albumin: 3.9 g/dL (ref 3.5–5.2)
Alkaline Phosphatase: 92 U/L (ref 39–117)
BUN: 13 mg/dL (ref 6–23)
CO2: 31 mEq/L (ref 19–32)
Calcium: 9.6 mg/dL (ref 8.4–10.5)
Chloride: 103 mEq/L (ref 96–112)
Creatinine, Ser: 0.77 mg/dL (ref 0.40–1.20)
GFR: 79.92 mL/min (ref >60.00)
Glucose, Bld: 79 mg/dL (ref 70–99)
Potassium: 3.7 mEq/L (ref 3.5–5.1)
Sodium: 141 mEq/L (ref 135–145)
Total Bilirubin: 0.5 mg/dL (ref 0.2–1.2)
Total Protein: 7.5 g/dL (ref 6.0–8.3)

## 2022-06-19 LAB — URINALYSIS WITH CULTURE, IF INDICATED
Bilirubin Urine: NEGATIVE
Ketones, ur: NEGATIVE
Leukocytes,Ua: NEGATIVE
Nitrite: NEGATIVE
Specific Gravity, Urine: 1.015 (ref 1.000–1.030)
Total Protein, Urine: NEGATIVE
Urine Glucose: NEGATIVE
Urobilinogen, UA: 0.2 (ref 0.0–1.0)
pH: 7.5 (ref 5.0–8.0)

## 2022-06-19 LAB — CBC
HCT: 42 % (ref 36.0–46.0)
Hemoglobin: 13.8 g/dL (ref 12.0–15.0)
MCHC: 32.9 g/dL (ref 30.0–36.0)
MCV: 90.8 fl (ref 78.0–100.0)
Platelets: 241 10*3/uL (ref 150.0–400.0)
RBC: 4.63 Mil/uL (ref 3.87–5.11)
RDW: 14.1 % (ref 11.5–15.5)
WBC: 5.5 10*3/uL (ref 4.0–10.5)

## 2022-06-19 LAB — HEMOGLOBIN A1C: Hgb A1c MFr Bld: 6 % (ref 4.6–6.5)

## 2022-06-19 LAB — BRAIN NATRIURETIC PEPTIDE: Pro B Natriuretic peptide (BNP): 29 pg/mL (ref 0.0–100.0)

## 2022-06-19 MED ORDER — AMLODIPINE BESYLATE 5 MG PO TABS: 5.0000 mg | ORAL_TABLET | Freq: Every day | ORAL | 3 refills | Status: DC

## 2022-06-19 MED ORDER — METOPROLOL SUCCINATE ER 25 MG PO TB24: 25.0000 mg | ORAL_TABLET | Freq: Every day | ORAL | 0 refills | Status: DC

## 2022-06-19 MED ORDER — POTASSIUM CHLORIDE CRYS ER 20 MEQ PO TBCR: 20.0000 meq | EXTENDED_RELEASE_TABLET | Freq: Every evening | ORAL | 3 refills | Status: DC

## 2022-06-19 MED ORDER — FUROSEMIDE 20 MG PO TABS: 20.0000 mg | ORAL_TABLET | Freq: Every day | ORAL | 1 refills | Status: DC

## 2022-06-19 MED ORDER — ROSUVASTATIN CALCIUM 40 MG PO TABS: 40.0000 mg | ORAL_TABLET | Freq: Every day | ORAL | 3 refills | Status: DC

## 2022-06-19 NOTE — Assessment & Plan Note (Signed)
Worse  Start amlodipine 5 mg once daily  Continue metoprolol (restarting)

## 2022-06-19 NOTE — Assessment & Plan Note (Signed)
Restart metoprolol as pt ran out. Rx sent in to pharmacy

## 2022-06-19 NOTE — Assessment & Plan Note (Signed)
Suspected vascular dermatitis  Did advise pt to f/u with vascular surgeon for eval/treat as pedal edema worse  Decreased pulses pt states she will call to make appt.

## 2022-06-19 NOTE — Progress Notes (Signed)
Established Patient Office Visit  Subjective:      CC:  Chief Complaint  Patient presents with   Leg Swelling    Swelling in both legs for a couple months.   Headache    With a little dizziness    HPI: Lorraine Ryan is a 67 y.o. female presenting on 06/19/2022 for Leg Swelling (Swelling in both legs for a couple months.) and Headache (With a little dizziness) . Lower extremity edema, ongoing has since ran out of furosemide no longer takin potassium.   Has also ran out of metoprolol.  Still taking gabapentin for nerve pain which is helping as well as mobic for arthritis.   No longer taking tramadol.  Also ran out of the cholesterol medication, rosuvastatin.        Social history:  Relevant past medical, surgical, family and social history reviewed and updated as indicated. Interim medical history since our last visit reviewed.  Allergies and medications reviewed and updated.  DATA REVIEWED: CHART IN EPIC     ROS: Negative unless specifically indicated above in HPI.    Current Outpatient Medications:    amLODipine (NORVASC) 5 MG tablet, Take 1 tablet (5 mg total) by mouth daily., Disp: 90 tablet, Rfl: 3   gabapentin (NEURONTIN) 300 MG capsule, Take 300 mg by mouth 3 (three) times daily., Disp: , Rfl:    meloxicam (MOBIC) 15 MG tablet, Take 15 mg by mouth daily., Disp: , Rfl:    furosemide (LASIX) 20 MG tablet, Take 1 tablet (20 mg total) by mouth daily., Disp: 90 tablet, Rfl: 1   metoprolol succinate (TOPROL XL) 25 MG 24 hr tablet, Take 1 tablet (25 mg total) by mouth daily., Disp: 30 tablet, Rfl: 0   potassium chloride SA (KLOR-CON M20) 20 MEQ tablet, Take 1 tablet (20 mEq total) by mouth every evening., Disp: 30 tablet, Rfl: 3   rosuvastatin (CRESTOR) 40 MG tablet, Take 1 tablet (40 mg total) by mouth daily., Disp: 90 tablet, Rfl: 3      Objective:    BP (!) 160/90 (BP Location: Left Arm)   Pulse 80   Temp 98 F (36.7 C) (Temporal)   Ht 5\' 6"  (1.676  m)   Wt 293 lb 6.4 oz (133.1 kg)   SpO2 99%   BMI 47.36 kg/m   Wt Readings from Last 3 Encounters:  06/19/22 293 lb 6.4 oz (133.1 kg)  01/22/22 272 lb 4.3 oz (123.5 kg)  01/14/22 270 lb (122.5 kg)    Physical Exam Constitutional:      General: She is not in acute distress.    Appearance: Normal appearance. She is normal weight. She is not ill-appearing, toxic-appearing or diaphoretic.  HENT:     Head: Normocephalic.  Cardiovascular:     Rate and Rhythm: Normal rate and regular rhythm.     Pulses: Decreased pulses.     Heart sounds: Normal heart sounds.     Comments: Vascular dermatitis with scaly texture bil lower extremities  Pulmonary:     Effort: Pulmonary effort is normal.  Musculoskeletal:        General: Normal range of motion.     Right lower leg: 2+ Pitting Edema present.     Left lower leg: 2+ Pitting Edema present.  Neurological:     General: No focal deficit present.     Mental Status: She is alert and oriented to person, place, and time. Mental status is at baseline.  Psychiatric:  Attention and Perception: She perceives auditory hallucinations.        Mood and Affect: Mood normal.        Speech: Speech normal.        Behavior: Behavior normal. Behavior is cooperative.        Thought Content: Thought content normal.        Cognition and Memory: Cognition and memory normal.        Judgment: Judgment normal.           Assessment & Plan:  Hypokalemia  Essential hypertension Assessment & Plan: Worse  Start amlodipine 5 mg once daily  Continue metoprolol (restarting)  Orders: -     Metoprolol Succinate ER; Take 1 tablet (25 mg total) by mouth daily.  Dispense: 30 tablet; Refill: 0 -     amLODIPine Besylate; Take 1 tablet (5 mg total) by mouth daily.  Dispense: 90 tablet; Refill: 3  Hyperlipidemia, unspecified hyperlipidemia type -     Rosuvastatin Calcium; Take 1 tablet (40 mg total) by mouth daily.  Dispense: 90 tablet; Refill: 3  Dyspnea  on exertion -     Furosemide; Take 1 tablet (20 mg total) by mouth daily.  Dispense: 90 tablet; Refill: 1 -     Potassium Chloride Crys ER; Take 1 tablet (20 mEq total) by mouth every evening.  Dispense: 30 tablet; Refill: 3  Pedal edema -     Furosemide; Take 1 tablet (20 mg total) by mouth daily.  Dispense: 90 tablet; Refill: 1 -     Potassium Chloride Crys ER; Take 1 tablet (20 mEq total) by mouth every evening.  Dispense: 30 tablet; Refill: 3 -     CBC -     Brain natriuretic peptide  Bilateral lower extremity edema Assessment & Plan: Restart furosemide Also ordering BNP and assessing kidney function with CMP  Orders: -     Furosemide; Take 1 tablet (20 mg total) by mouth daily.  Dispense: 90 tablet; Refill: 1 -     Potassium Chloride Crys ER; Take 1 tablet (20 mEq total) by mouth every evening.  Dispense: 30 tablet; Refill: 3  Anemia, unspecified type -     CBC  Elevated LFTs -     Comprehensive metabolic panel -     Hepatitis panel, acute  DOE (dyspnea on exertion) -     Brain natriuretic peptide  Dermatitis of lower extremity Assessment & Plan: Suspected vascular dermatitis  Did advise pt to f/u with vascular surgeon for eval/treat as pedal edema worse  Decreased pulses pt states she will call to make appt.     Prediabetes Assessment & Plan: Pt advised of the following: Work on a diabetic diet, try to incorporate exercise at least 20-30 a day for 3 days a week or more.    Orders: -     Hemoglobin A1c  Benign essential microscopic hematuria -     Urinalysis with Culture, if indicated  Frequent PVCs Assessment & Plan: Restart metoprolol as pt ran out. Rx sent in to pharmacy    Verbal auditory hallucination Assessment & Plan: stable   Non-compliant patient     Return in about 1 month (around 07/20/2022) for f/u edema and blood pressure.  Mort Sawyers, MSN, APRN, FNP-C [facility]

## 2022-06-19 NOTE — Assessment & Plan Note (Signed)
stable °

## 2022-06-19 NOTE — Patient Instructions (Addendum)
Call back the vascular surgeon to get an appointment with them for your ongoing swelling.   Melvenia Beam, MD  [address]  [address]  [place], [place] [address]  Phone: [phone]   Stop by the lab prior to leaving today. I will notify you of your results once received.   RECOMMEND STARTING AMLODIPINE 5 MG ONCE DAILY  Continue metoprolol  Start furosemide again and potassium for fluid in legs.    Regards,   Mort Sawyers FNP-C

## 2022-06-19 NOTE — Assessment & Plan Note (Signed)
Restart furosemide Also ordering BNP and assessing kidney function with CMP

## 2022-06-19 NOTE — Assessment & Plan Note (Signed)
Pt advised of the following: Work on a diabetic diet, try to incorporate exercise at least 20-30 a day for 3 days a week or more.   

## 2022-06-20 LAB — HEPATITIS PANEL, ACUTE
Hep A IgM: NONREACTIVE
Hep B C IgM: NONREACTIVE
Hepatitis B Surface Ag: NONREACTIVE
Hepatitis C Ab: NONREACTIVE

## 2022-07-08 ENCOUNTER — Other Ambulatory Visit: Payer: Self-pay | Admitting: Family

## 2022-07-08 DIAGNOSIS — K219 Gastro-esophageal reflux disease without esophagitis: Secondary | ICD-10-CM

## 2022-07-08 DIAGNOSIS — I10 Essential (primary) hypertension: Secondary | ICD-10-CM

## 2022-08-11 ENCOUNTER — Other Ambulatory Visit: Payer: Self-pay | Admitting: Family

## 2022-08-11 DIAGNOSIS — R600 Localized edema: Secondary | ICD-10-CM

## 2022-08-11 DIAGNOSIS — R0609 Other forms of dyspnea: Secondary | ICD-10-CM

## 2022-08-30 ENCOUNTER — Other Ambulatory Visit: Payer: Self-pay | Admitting: Family

## 2022-09-07 ENCOUNTER — Other Ambulatory Visit: Payer: Self-pay | Admitting: Podiatry

## 2022-09-17 ENCOUNTER — Encounter: Payer: Self-pay | Admitting: Sports Medicine

## 2022-09-18 ENCOUNTER — Encounter: Payer: Self-pay | Admitting: Sports Medicine

## 2022-09-18 NOTE — Progress Notes (Signed)
This encounter was created in error - please disregard.

## 2022-09-23 ENCOUNTER — Encounter: Payer: Self-pay | Admitting: Adult Health

## 2022-10-04 ENCOUNTER — Ambulatory Visit: Payer: Medicaid Other | Admitting: Adult Health

## 2022-10-08 ENCOUNTER — Telehealth: Payer: Self-pay | Admitting: Family

## 2022-10-08 NOTE — Telephone Encounter (Signed)
Pt would like to TOC . Pt stated she have back problems and need a doctor that is in  because it is closer to her

## 2022-10-08 NOTE — Telephone Encounter (Signed)
This is fine with me.  Does she have a specific person she would like to TOC too

## 2022-10-09 NOTE — Telephone Encounter (Signed)
Attempted to contact pt. There was no answer and no option to leave a message due to her VM being full. Will try back later.

## 2022-10-10 NOTE — Telephone Encounter (Signed)
Pt has been scheduled with Jiles Prows, NP as a new patient.

## 2022-10-17 ENCOUNTER — Ambulatory Visit: Payer: Medicaid Other | Admitting: Nurse Practitioner

## 2022-10-17 ENCOUNTER — Ambulatory Visit: Payer: Self-pay

## 2022-10-17 NOTE — Telephone Encounter (Signed)
      Chief Complaint: Lower abdominal pain. Radiates to back. Symptoms: Above plus constipation. Frequency: 2017 Pertinent Negatives: Patient denies  Disposition: [] ED /[] Urgent Care (no appt availability in office) / [] Appointment(In office/virtual)/ []  [facility]/ [] Home Care/ [] Refused Recommended Disposition /[] [facility]/ [x]  Follow-up with PCP Additional Notes: Pt. Has new patient appointment today.  Reason for Disposition  [1] MODERATE pain (e.g., interferes with normal activities) AND [2] pain comes and goes (cramps) AND [3] present > 24 hours  (Exception: Pain with Vomiting or Diarrhea - see that Guideline.)  Answer Assessment - Initial Assessment Questions 1. LOCATION: "Where does it hurt?"      Lower 2. RADIATION: "Does the pain shoot anywhere else?" (e.g., chest, back)     Back 3. ONSET: "When did the pain begin?" (e.g., minutes, hours or days ago)      Few months 4. SUDDEN: "Gradual or sudden onset?"     Gradual 5. PATTERN "Does the pain come and go, or is it constant?"    - If it comes and goes: "How long does it last?" "Do you have pain now?"     (Note: Comes and goes means the pain is intermittent. It goes away completely between bouts.)    - If constant: "Is it getting better, staying the same, or getting worse?"      (Note: Constant means the pain never goes away completely; most serious pain is constant and gets worse.)      Constant 6. SEVERITY: "How bad is the pain?"  (e.g., Scale 1-10; mild, moderate, or severe)    - MILD (1-3): Doesn't interfere with normal activities, abdomen soft and not tender to touch.     - MODERATE (4-7): Interferes with normal activities or awakens from sleep, abdomen tender to touch.     - SEVERE (8-10): Excruciating pain, doubled over, unable to do any normal activities.       Now - 10 7. RECURRENT SYMPTOM: "Have you ever had this type of stomach pain before?" If Yes, ask: "When was the last time?" and "What  happened that time?"      Yes 8. CAUSE: "What do you think is causing the stomach pain?"     Unsure 9. RELIEVING/AGGRAVATING FACTORS: "What makes it better or worse?" (e.g., antacids, bending or twisting motion, bowel movement)     No 10. OTHER SYMPTOMS: "Do you have any other symptoms?" (e.g., back pain, diarrhea, fever, urination pain, vomiting)       Back pain, constipation 11. PREGNANCY: "Is there any chance you are pregnant?" "When was your last menstrual period?"       No  Protocols used: Abdominal Pain - [facility]

## 2022-10-18 ENCOUNTER — Ambulatory Visit (INDEPENDENT_AMBULATORY_CARE_PROVIDER_SITE_OTHER): Payer: Medicare HMO | Admitting: Internal Medicine

## 2022-10-18 ENCOUNTER — Encounter: Payer: Self-pay | Admitting: Internal Medicine

## 2022-10-18 VITALS — BP 120/80 | HR 82 | Temp 98.2°F | Ht 66.0 in | Wt 285.0 lb

## 2022-10-18 DIAGNOSIS — R051 Acute cough: Secondary | ICD-10-CM | POA: Diagnosis not present

## 2022-10-18 DIAGNOSIS — R600 Localized edema: Secondary | ICD-10-CM | POA: Diagnosis not present

## 2022-10-18 DIAGNOSIS — R0609 Other forms of dyspnea: Secondary | ICD-10-CM

## 2022-10-18 DIAGNOSIS — I872 Venous insufficiency (chronic) (peripheral): Secondary | ICD-10-CM | POA: Insufficient documentation

## 2022-10-18 DIAGNOSIS — U071 COVID-19: Secondary | ICD-10-CM | POA: Insufficient documentation

## 2022-10-18 DIAGNOSIS — M549 Dorsalgia, unspecified: Secondary | ICD-10-CM

## 2022-10-18 LAB — POC COVID19 BINAXNOW: SARS Coronavirus 2 Ag: POSITIVE — AB

## 2022-10-18 MED ORDER — POTASSIUM CHLORIDE CRYS ER 20 MEQ PO TBCR: 20.0000 meq | EXTENDED_RELEASE_TABLET | Freq: Every day | ORAL | 1 refills | Status: DC

## 2022-10-18 MED ORDER — FUROSEMIDE 40 MG PO TABS: 40.0000 mg | ORAL_TABLET | Freq: Every day | ORAL | 3 refills | Status: DC

## 2022-10-18 NOTE — Assessment & Plan Note (Signed)
Discussed stopping salt--can use salt substitute Refill potassium  Increase furosemide to 40mg  daily Should wear support hose daily May need to go back to Dr Wyn Quaker

## 2022-10-18 NOTE — Progress Notes (Signed)
Subjective:    Patient ID: Lorraine Ryan, female    DOB: 03-10-1955, 67 y.o.   MRN: [id number]  HPI Here with back pain and respiratory symptoms  Started 3-4 days ago with "cold" Mild symptoms No fever but some chills No SOB Some cough---dry Using cold remedy but ran out---discussed analgesics  Still having swelling in legs Burnt them in sauna--in December Hospitalized then  Takes the furosemide-- does have to void after but not helping the edema  Some chronic back pain No real change  Current Outpatient Medications on File Prior to Visit  Medication Sig Dispense Refill   amLODipine (NORVASC) 5 MG tablet Take 1 tablet (5 mg total) by mouth daily. 90 tablet 3   furosemide (LASIX) 20 MG tablet TAKE 1 TABLET BY MOUTH EVERY DAY 90 tablet 1   gabapentin (NEURONTIN) 300 MG capsule TAKE 1 CAPSULE BY MOUTH THREE TIMES A DAY 90 capsule 2   KLOR-CON M20 20 MEQ tablet TAKE 1 TABLET BY MOUTH EVERY DAY 90 tablet 1   lisinopril (ZESTRIL) 20 MG tablet TAKE 1 TABLET BY MOUTH EVERY DAY 90 tablet 3   meloxicam (MOBIC) 15 MG tablet TAKE 1 TABLET (15 MG TOTAL) BY MOUTH DAILY. 30 tablet 1   metoprolol succinate (TOPROL-XL) 25 MG 24 hr tablet TAKE 1 TABLET (25 MG TOTAL) BY MOUTH DAILY. 90 tablet 3   omeprazole (PRILOSEC) 40 MG capsule TAKE 1 CAPSULE (40 MG TOTAL) BY MOUTH DAILY. 90 capsule 3   rosuvastatin (CRESTOR) 40 MG tablet Take 1 tablet (40 mg total) by mouth daily. 90 tablet 3   No current facility-administered medications on file prior to visit.    Allergies  Allergen Reactions   Codeine Other (See Comments)    Headache   Oxycontin [Oxycodone] Nausea Only and Palpitations   Prednisone Other (See Comments)    Headache   Ultram [Tramadol] Nausea Only    Past Medical History:  Diagnosis Date   Anxiety    Arthritis    Back pain    Bipolar disorder (HCC)    Chronic headaches    Dementia (HCC)    Depression    Diabetes mellitus without complication (HCC)    GERD  (gastroesophageal reflux disease)    Hypertension    No meds prescribed; states intermittent   Neuromuscular disorder (HCC)    Schizoaffective psychosis (HCC) 06/27/2020   Shortness of breath dyspnea    Stroke (HCC)    caused numbness in leg    Past Surgical History:  Procedure Laterality Date   ABDOMINAL HYSTERECTOMY     no cervix   COLONOSCOPY WITH PROPOFOL N/A 04/18/2015   XLK:GMWNUUVO hemorrhoids/diverticulosis sigmoid colon/   ESOPHAGOGASTRODUODENOSCOPY (EGD) WITH PROPOFOL N/A 04/18/2015   SLF:web in the proximal esophagus/dilated/   GANGLION CYST EXCISION     LUMBAR LAMINECTOMY/DECOMPRESSION MICRODISCECTOMY N/A 02/08/2015   Procedure: L4-5 Decompression;  Surgeon: Eldred Manges, MD;  Location: [facility] OR;  Service: Orthopedics;  Laterality: N/A;   POLYPECTOMY  04/18/2015   Procedure: POLYPECTOMY;  Surgeon: West Bali, MD;  Location: [facility];  Service: Endoscopy;;  ascending colon polyp   TONSILLECTOMY      Family History  Problem Relation Age of Onset   Diabetes Mother    Hypertension Mother    Heart disease Other        No family history   Breast cancer Sister        diagnosed in her 38's   Anxiety disorder Sister    Depression Sister  Anxiety disorder Brother    Depression Brother    Anxiety disorder Brother    Depression Brother    Cancer Maternal Grandmother        unknown type   Colon cancer Neg Hx     Social History   Socioeconomic History   Marital status: Single    Spouse name: Not on file   Number of children: 5   Years of education: Not on file   Highest education level: Associate degree: occupational, Scientist, product/process development, or vocational program  Occupational History   Not on file  Tobacco Use   Smoking status: Former    Current packs/day: 0.00    Average packs/day: 0.5 packs/day for 10.0 years (5.0 ttl pk-yrs)    Types: Cigarettes    Start date: 03/30/1975    Quit date: 03/29/1985    Years since quitting: 37.5   Smokeless tobacco: Never  Vaping Use    Vaping status: Never Used  Substance and Sexual Activity   Alcohol use: No    Alcohol/week: 0.0 standard drinks of alcohol    Comment: Used to drink heavily, no ETOH in "30-some years"   Drug use: No    Comment: History of crack, "no drugs in 30-some years"   Sexual activity: Not Currently  Other Topics Concern   Not on file  Social History Narrative   She is no longer living with [place]- her sone >1.5 years ago. [place] got married yest and pans to go into the [employer]. She no longer has a place to live.    She is living in her truck and plans to go to [place] to be with other family    She has other children in       Exercise: gets in the pool  and showers there   Diet: eats a lot of fast food   Social Determinants of Health   Financial Resource Strain: Low Risk  (09/13/2019)   Overall Financial Resource Strain (CARDIA)    Difficulty of Paying Living Expenses: Not very hard  Food Insecurity: No Food Insecurity (01/15/2022)   Hunger Vital Sign    Worried About Running Out of Food in the Last Year: Never true    Ran Out of Food in the Last Year: Never true  Transportation Needs: No Transportation Needs (01/15/2022)   PRAPARE - Administrator, Civil Service (Medical): No    Lack of Transportation (Non-Medical): No  Physical Activity: Insufficiently Active (12/27/2017)   Exercise Vital Sign    Days of Exercise per Week: 1 day    Minutes of Exercise per Session: 60 min  Stress: No Stress Concern Present (09/13/2019)   [employer] of Occupational Health - Occupational Stress Questionnaire    Feeling of Stress : Only a little  Social Connections: Unknown (08/02/2022)   [facility] from [facility]   Social Network    Social Network: Not on file  Intimate Partner Violence: Unknown (08/02/2022)   Received from [facility]   HITS    Physically Hurt: Not on file    Insult or Talk Down To: Not on file    Threaten Physical Harm: Not on file    Scream or Curse: Not  on file   Review of Systems Does use salt daily on her food Weight is up and down some     Objective:   Physical Exam HENT:     Head:     Comments: Slight sinus tenderness    Mouth/Throat:  Pharynx: No oropharyngeal exudate or posterior oropharyngeal erythema.  Pulmonary:     Effort: Pulmonary effort is normal.     Breath sounds: Normal breath sounds. No wheezing or rales.  Musculoskeletal:     Cervical back: Neck supple.     Comments: 3+ non pitting edema--up to knees at least  Lymphadenopathy:     Cervical: No cervical adenopathy.  Neurological:     Mental Status: She is alert.            Assessment & Plan:

## 2022-10-18 NOTE — Assessment & Plan Note (Signed)
Mild She considered it a cold and not the main reason she came in Discussed tylenol--okay cold meds if they help

## 2022-10-18 NOTE — Patient Instructions (Addendum)
Please stop salt---you can use salt substitute Increase the furosemide to 40mg  daily Restart the potassium  Wear the support hose every day--take them off at night Take tylenol 650mg  at least 3-4 times a day for your back and respiratory symptoms Set up to see Tabitha in the next few weeks

## 2022-10-18 NOTE — Assessment & Plan Note (Signed)
I am unsure about this----asks for more gabapentin but not sure why she is on it Discussed regular tyelnol and will set back up with [place]

## 2022-10-26 ENCOUNTER — Encounter: Payer: Self-pay | Admitting: Family Medicine

## 2022-10-31 ENCOUNTER — Ambulatory Visit: Payer: Medicare HMO | Admitting: Family

## 2022-11-14 ENCOUNTER — Ambulatory Visit: Payer: Medicaid Other | Admitting: Family Medicine

## 2022-11-18 ENCOUNTER — Ambulatory Visit: Payer: Medicare HMO | Admitting: Family

## 2022-11-19 ENCOUNTER — Encounter: Payer: Self-pay | Admitting: Family

## 2022-12-04 ENCOUNTER — Ambulatory Visit: Payer: Medicare HMO | Admitting: Internal Medicine

## 2022-12-05 ENCOUNTER — Ambulatory Visit: Payer: Medicare HMO | Admitting: Nurse Practitioner

## 2022-12-06 ENCOUNTER — Ambulatory Visit (INDEPENDENT_AMBULATORY_CARE_PROVIDER_SITE_OTHER): Payer: Medicare HMO | Admitting: Nurse Practitioner

## 2022-12-06 VITALS — BP 126/78 | HR 100 | Temp 98.5°F | Ht 66.0 in | Wt 288.0 lb

## 2022-12-06 DIAGNOSIS — Z23 Encounter for immunization: Secondary | ICD-10-CM

## 2022-12-06 DIAGNOSIS — R600 Localized edema: Secondary | ICD-10-CM

## 2022-12-06 DIAGNOSIS — I10 Essential (primary) hypertension: Secondary | ICD-10-CM | POA: Diagnosis not present

## 2022-12-06 MED ORDER — LISINOPRIL 40 MG PO TABS: 40.0000 mg | ORAL_TABLET | Freq: Every day | ORAL | 0 refills | Status: DC

## 2022-12-06 NOTE — Patient Instructions (Addendum)
I am going to check some labs today I am going to discontinue the amlodipine blood pressure medication altogether. I am going to increase the lisinopril form 20mg  to 40mg  a day.  Follow up with tabitha in 2-4 weeks  Check your blood pressure daily for the next 2 weeks and write it down

## 2022-12-06 NOTE — Assessment & Plan Note (Signed)
Patient currently on amlodipine and lisinopril 20 mg.  Patient had a period of time when she is taken off amlodipine coronary lower extremity edema will discontinue amlodipine 5 mg altogether increase lisinopril to 40 mg.  Patient can check blood pressure at home she do this daily for the next 2 weeks and follow-up with Tabitha within the month.

## 2022-12-06 NOTE — Progress Notes (Signed)
Acute Office Visit  Subjective:     Patient ID: Lorraine Ryan, female    DOB: [birth year], 67 y.o.   MRN: [id number]  Chief Complaint  Patient presents with   Leg Swelling    B/l into feet x 2-3 months.     HPI Patient is in today for leg swelling with a history of hypertension, stroke, diabetes.  Acute heart failure  Patient was seen on 10/18/2022 by Dr. Tillman Abide for dyspnea on exertion, pedal edema, bilateral lower extremity swelling.  Patient was instructed to stop using salt and Lasix was increased to 40 mg daily.  And potassium supplementation started was also instructed to wear support hose through the day and take them off at night. Patient's most recent BMP was Jun 19, 2022 which was negative Last echocardiogram was 01/21/2022 that showed an EF of 55 to 60% with grade 1 systolic dysfunction  States that she has been having it for approx 8 months. States that it has stayed about the same. States that she did lasix and it did not help. States that she can check her blood pressure daily.   States that she cannot sleep flat. She has to sleep sitting up.  States that she has  Review of Systems  Constitutional:  Negative for chills and fever.  Respiratory:  Positive for shortness of breath (with exterion).   Cardiovascular:  Negative for chest pain.  Genitourinary:  Negative for dysuria and hematuria.  Neurological:  Negative for headaches.        Objective:    BP 126/78   Pulse 100   Temp 98.5 F (36.9 C) (Oral)   Ht 5\' 6"  (1.676 m)   Wt 288 lb (130.6 kg)   SpO2 95%   BMI 46.48 kg/m  BP Readings from Last 3 Encounters:  12/06/22 126/78  10/18/22 120/80  06/19/22 (!) 160/90   Wt Readings from Last 3 Encounters:  12/06/22 288 lb (130.6 kg)  10/18/22 285 lb (129.3 kg)  06/19/22 293 lb 6.4 oz (133.1 kg)   SpO2 Readings from Last 3 Encounters:  12/06/22 95%  10/18/22 95%  06/19/22 99%      Physical Exam Vitals and nursing note reviewed.   Constitutional:      Appearance: Normal appearance.  Cardiovascular:     Rate and Rhythm: Normal rate and regular rhythm.     Heart sounds: Normal heart sounds.  Pulmonary:     Effort: Pulmonary effort is normal.     Breath sounds: Normal breath sounds.  Musculoskeletal:     Right lower leg: Edema present.     Left lower leg: Edema present.  Neurological:     Mental Status: She is alert.     No results found for any visits on 12/06/22.      Assessment & Plan:   Problem List Items Addressed This Visit       Cardiovascular and Mediastinum   Primary hypertension    Patient currently on amlodipine and lisinopril 20 mg.  Patient had a period of time when she is taken off amlodipine coronary lower extremity edema will discontinue amlodipine 5 mg altogether increase lisinopril to 40 mg.  Patient can check blood pressure at home she do this daily for the next 2 weeks and follow-up with Tabitha within the month.      Relevant Medications   lisinopril (ZESTRIL) 40 MG tablet   Other Relevant Orders   CBC   Comprehensive metabolic panel   TSH  Other   Bilateral lower extremity edema - Primary    Longstanding issue did have echo done in December 2023.  Showed EF of 55 to 60%.  Will also show grade 1 systolic dysfunction.  Patient has been on Lasix in the past without great benefit.  Does not go down with elevation.  He does have compression socks but does not wear them all the time.  Will check basic labs to make sure she is not in acute heart failure which I doubt and switch antihypertensives from calcium channel blocker just to the ACE      Relevant Orders   Brain natriuretic peptide   TSH   Other Visit Diagnoses     Encounter for immunization       Relevant Orders   Flu Vaccine Trivalent High Dose (Fluad) (Completed)       Meds ordered this encounter  Medications   lisinopril (ZESTRIL) 40 MG tablet    Sig: Take 1 tablet (40 mg total) by mouth daily.    Dispense:   90 tablet    Refill:  0    Order Specific Question:   Supervising Provider    Answer:   Roxy Manns A [[id number]]    Return in about 3 weeks (around 12/27/2022) for BP recheck, edema, with Angelene Giovanni, NP

## 2022-12-06 NOTE — Assessment & Plan Note (Signed)
Longstanding issue did have echo done in December 2023.  Showed EF of 55 to 60%.  Will also show grade 1 systolic dysfunction.  Patient has been on Lasix in the past without great benefit.  Does not go down with elevation.  He does have compression socks but does not wear them all the time.  Will check basic labs to make sure she is not in acute heart failure which I doubt and switch antihypertensives from calcium channel blocker just to the ACE

## 2022-12-07 LAB — COMPREHENSIVE METABOLIC PANEL
AG Ratio: 1.4 (calc) (ref 1.0–2.5)
ALT: 23 U/L (ref 6–29)
AST: 24 U/L (ref 10–35)
Albumin: 4.1 g/dL (ref 3.6–5.1)
Alkaline phosphatase (APISO): 113 U/L (ref 37–153)
BUN: 9 mg/dL (ref 7–25)
CO2: 26 mmol/L (ref 20–32)
Calcium: 9.7 mg/dL (ref 8.6–10.4)
Chloride: 106 mmol/L (ref 98–110)
Creat: 0.76 mg/dL (ref 0.50–1.05)
Globulin: 2.9 g/dL (ref 1.9–3.7)
Glucose, Bld: 91 mg/dL (ref 65–99)
Potassium: 4 mmol/L (ref 3.5–5.3)
Sodium: 142 mmol/L (ref 135–146)
Total Bilirubin: 0.6 mg/dL (ref 0.2–1.2)
Total Protein: 7 g/dL (ref 6.1–8.1)

## 2022-12-07 LAB — CBC
HCT: 43.2 % (ref 35.0–45.0)
Hemoglobin: 14.3 g/dL (ref 11.7–15.5)
MCH: 30.5 pg (ref 27.0–33.0)
MCHC: 33.1 g/dL (ref 32.0–36.0)
MCV: 92.1 fL (ref 80.0–100.0)
MPV: 11.6 fL (ref 7.5–12.5)
Platelets: 247 10*3/uL (ref 140–400)
RBC: 4.69 10*6/uL (ref 3.80–5.10)
RDW: 12.6 % (ref 11.0–15.0)
WBC: 5.2 10*3/uL (ref 3.8–10.8)

## 2022-12-07 LAB — BRAIN NATRIURETIC PEPTIDE: Brain Natriuretic Peptide: 22 pg/mL (ref <100)

## 2022-12-07 LAB — TSH: TSH: 1.12 m[IU]/L (ref 0.40–4.50)

## 2022-12-10 NOTE — Progress Notes (Signed)
Noted. Thank you for seeing her. 

## 2022-12-18 ENCOUNTER — Encounter: Payer: Self-pay | Admitting: Family

## 2022-12-19 ENCOUNTER — Ambulatory Visit: Payer: Medicaid Other | Admitting: Nurse Practitioner

## 2022-12-19 ENCOUNTER — Ambulatory Visit: Payer: Medicare HMO | Admitting: Nurse Practitioner

## 2022-12-24 ENCOUNTER — Ambulatory Visit (INDEPENDENT_AMBULATORY_CARE_PROVIDER_SITE_OTHER): Payer: Self-pay | Admitting: Podiatry

## 2022-12-24 DIAGNOSIS — Z91199 Patient's noncompliance with other medical treatment and regimen due to unspecified reason: Secondary | ICD-10-CM

## 2022-12-24 NOTE — Progress Notes (Signed)
No show

## 2022-12-26 ENCOUNTER — Telehealth: Payer: Self-pay | Admitting: Family

## 2022-12-26 ENCOUNTER — Ambulatory Visit (INDEPENDENT_AMBULATORY_CARE_PROVIDER_SITE_OTHER)
Admission: RE | Admit: 2022-12-26 | Discharge: 2022-12-26 | Disposition: A | Discharge status: Home / Self Care | Payer: Medicare HMO | Source: Ambulatory Visit | Attending: Family | Admitting: Family

## 2022-12-26 ENCOUNTER — Ambulatory Visit (INDEPENDENT_AMBULATORY_CARE_PROVIDER_SITE_OTHER): Payer: Medicare HMO | Admitting: Family

## 2022-12-26 VITALS — BP 130/78 | HR 71 | Temp 98.6°F | Ht 66.0 in | Wt 286.0 lb

## 2022-12-26 DIAGNOSIS — I10 Essential (primary) hypertension: Secondary | ICD-10-CM | POA: Diagnosis not present

## 2022-12-26 DIAGNOSIS — K219 Gastro-esophageal reflux disease without esophagitis: Secondary | ICD-10-CM

## 2022-12-26 DIAGNOSIS — R0609 Other forms of dyspnea: Secondary | ICD-10-CM | POA: Diagnosis not present

## 2022-12-26 DIAGNOSIS — G8929 Other chronic pain: Secondary | ICD-10-CM

## 2022-12-26 DIAGNOSIS — Z91199 Patient's noncompliance with other medical treatment and regimen due to unspecified reason: Secondary | ICD-10-CM

## 2022-12-26 DIAGNOSIS — E785 Hyperlipidemia, unspecified: Secondary | ICD-10-CM

## 2022-12-26 DIAGNOSIS — R600 Localized edema: Secondary | ICD-10-CM | POA: Diagnosis not present

## 2022-12-26 DIAGNOSIS — R062 Wheezing: Secondary | ICD-10-CM | POA: Diagnosis not present

## 2022-12-26 DIAGNOSIS — L03116 Cellulitis of left lower limb: Secondary | ICD-10-CM

## 2022-12-26 DIAGNOSIS — L03115 Cellulitis of right lower limb: Secondary | ICD-10-CM

## 2022-12-26 DIAGNOSIS — M545 Low back pain, unspecified: Secondary | ICD-10-CM

## 2022-12-26 DIAGNOSIS — I872 Venous insufficiency (chronic) (peripheral): Secondary | ICD-10-CM

## 2022-12-26 MED ORDER — FUROSEMIDE 40 MG PO TABS: 40.0000 mg | ORAL_TABLET | Freq: Every day | ORAL | 0 refills | Status: DC

## 2022-12-26 MED ORDER — POTASSIUM CHLORIDE CRYS ER 20 MEQ PO TBCR: 20.0000 meq | EXTENDED_RELEASE_TABLET | Freq: Every day | ORAL | 3 refills | Status: DC

## 2022-12-26 MED ORDER — OMEPRAZOLE 40 MG PO CPDR: 40.0000 mg | DELAYED_RELEASE_CAPSULE | Freq: Every day | ORAL | 3 refills | Status: DC

## 2022-12-26 MED ORDER — LISINOPRIL 40 MG PO TABS: 40.0000 mg | ORAL_TABLET | Freq: Every day | ORAL | 3 refills | Status: DC

## 2022-12-26 MED ORDER — ROSUVASTATIN CALCIUM 40 MG PO TABS: 40.0000 mg | ORAL_TABLET | Freq: Every day | ORAL | 3 refills | Status: DC

## 2022-12-26 MED ORDER — METOPROLOL SUCCINATE ER 25 MG PO TB24: 25.0000 mg | ORAL_TABLET | Freq: Every day | ORAL | 3 refills | Status: DC

## 2022-12-26 MED ORDER — GABAPENTIN 300 MG PO CAPS: 300.0000 mg | ORAL_CAPSULE | Freq: Every day | ORAL | 3 refills | Status: DC

## 2022-12-26 MED ORDER — CEPHALEXIN 500 MG PO CAPS: 500.0000 mg | ORAL_CAPSULE | Freq: Three times a day (TID) | ORAL | 0 refills | Status: AC

## 2022-12-26 NOTE — Assessment & Plan Note (Signed)
Pt non compliant and ran out of furosemide  Refill sent to pharmacy discussed compliance.  Again placed referral to vascular surgeon for ongoing pedal edema with stasis dermatitis.  Advised pt to monitor weight, let me know if > 2 pounds in one day.  Elevate legs and work on compression sock compliance as well.  Reviewed vas u/s  Could consider ABI however will just refer to vascular due to pt non compliance with imaging

## 2022-12-26 NOTE — Telephone Encounter (Signed)
Can you please print out handicap permit and its for orthopedist conditoin, I can sign.

## 2022-12-26 NOTE — Progress Notes (Signed)
Established Patient Office Visit  Subjective:   Patient ID: Lorraine Ryan, female    DOB: 08/30/1955  Age: 67 y.o. MRN: [id number]  CC:  Chief Complaint  Patient presents with   Back Pain   Foot Swelling    B/l let and foot swelling    Medical Management of Chronic Issues    Patient has not had any medications for over a week.     HPI: Lorraine Ryan is a 67 y.o. female presenting on 12/26/2022 for Back Pain, Foot Swelling (B/l let and foot swelling ), and Medical Management of Chronic Issues (Patient has not had any medications for over a week. )  Ongoing lower extremity swelling , on lasix 40 mg once daily but pt states not helping so much at all.  Recently saw podiatrist 12/24/22  Still with ongoing shortness of breath and DOE.  Pain with legs when getting out of the pool but otherwise able to walk around without pain.   Echo 01/21/22 with 55-60% EF  States has not taken a pill in the last two weeks because she states that pharmacy said she didn't have any refills.         ROS: Negative unless specifically indicated above in HPI.   Relevant past medical history reviewed and updated as indicated.   Allergies and medications reviewed and updated.   Current Outpatient Medications:    cephALEXin (KEFLEX) 500 MG capsule, Take 1 capsule (500 mg total) by mouth 3 (three) times daily for 10 days., Disp: 30 capsule, Rfl: 0   furosemide (LASIX) 40 MG tablet, Take 1 tablet (40 mg total) by mouth daily., Disp: 90 tablet, Rfl: 0   gabapentin (NEURONTIN) 300 MG capsule, Take 1 capsule (300 mg total) by mouth daily., Disp: 90 capsule, Rfl: 3   lisinopril (ZESTRIL) 40 MG tablet, Take 1 tablet (40 mg total) by mouth daily., Disp: 90 tablet, Rfl: 3   metoprolol succinate (TOPROL-XL) 25 MG 24 hr tablet, Take 1 tablet (25 mg total) by mouth daily., Disp: 90 tablet, Rfl: 3   omeprazole (PRILOSEC) 40 MG capsule, Take 1 capsule (40 mg total) by mouth daily., Disp: 90 capsule, Rfl: 3    potassium chloride SA (KLOR-CON M20) 20 MEQ tablet, Take 1 tablet (20 mEq total) by mouth daily., Disp: 90 tablet, Rfl: 3   rosuvastatin (CRESTOR) 40 MG tablet, Take 1 tablet (40 mg total) by mouth daily., Disp: 90 tablet, Rfl: 3  Allergies  Allergen Reactions   Codeine Other (See Comments)    Headache   Oxycontin [Oxycodone] Nausea Only and Palpitations   Prednisone Other (See Comments)    Headache   Ultram [Tramadol] Nausea Only    Objective:   BP 130/78   Pulse 71   Temp 98.6 F (37 C) (Oral)   Ht 5\' 6"  (1.676 m)   Wt 286 lb (129.7 kg)   SpO2 96%   BMI 46.16 kg/m    Physical Exam Constitutional:      General: She is not in acute distress.    Appearance: Normal appearance. She is obese. She is not ill-appearing, toxic-appearing or diaphoretic.  HENT:     Head: Normocephalic.  Cardiovascular:     Rate and Rhythm: Normal rate and regular rhythm.     Comments: Venous dermatitis with erythema bil from mid shin to base of foot Pulmonary:     Effort: Pulmonary effort is normal.     Breath sounds: Decreased air movement present. Examination of the left-upper field reveals  wheezing. Wheezing present.  Musculoskeletal:        General: Normal range of motion.     Right lower leg: 3+ Edema present.     Left lower leg: 3+ Edema present.  Neurological:     General: No focal deficit present.     Mental Status: She is alert and oriented to person, place, and time. Mental status is at baseline.  Psychiatric:        Mood and Affect: Mood normal.        Behavior: Behavior normal.        Thought Content: Thought content normal.        Judgment: Judgment normal.     Assessment & Plan:  Pedal edema -     Ambulatory referral to Vascular Surgery -     Potassium Chloride Crys ER; Take 1 tablet (20 mEq total) by mouth daily.  Dispense: 90 tablet; Refill: 3  Primary hypertension -     Lisinopril; Take 1 tablet (40 mg total) by mouth daily.  Dispense: 90 tablet; Refill:  3  Essential hypertension -     Metoprolol Succinate ER; Take 1 tablet (25 mg total) by mouth daily.  Dispense: 90 tablet; Refill: 3  Hyperlipidemia, unspecified hyperlipidemia type -     Rosuvastatin Calcium; Take 1 tablet (40 mg total) by mouth daily.  Dispense: 90 tablet; Refill: 3  Dyspnea on exertion -     Potassium Chloride Crys ER; Take 1 tablet (20 mEq total) by mouth daily.  Dispense: 90 tablet; Refill: 3  Bilateral lower extremity edema Assessment & Plan: Pt non compliant and ran out of furosemide  Refill sent to pharmacy discussed compliance.  Again placed referral to vascular surgeon for ongoing pedal edema with stasis dermatitis.  Advised pt to monitor weight, let me know if > 2 pounds in one day.  Elevate legs and work on compression sock compliance as well.  Reviewed vas u/s  Could consider ABI however will just refer to vascular due to pt non compliance with imaging    Orders: -     Potassium Chloride Crys ER; Take 1 tablet (20 mEq total) by mouth daily.  Dispense: 90 tablet; Refill: 3  Gastroesophageal reflux disease without esophagitis -     Omeprazole; Take 1 capsule (40 mg total) by mouth daily.  Dispense: 90 capsule; Refill: 3  Chronic midline low back pain without sciatica -     Gabapentin; Take 1 capsule (300 mg total) by mouth daily.  Dispense: 90 capsule; Refill: 3  Cellulitis of both lower extremities Assessment & Plan: Rx cephalexin 500 mg po tid x 10 days  Please monitor site for worsening signs/symptoms of infection to include: increasing redness, increasing tenderness, increase in size, and or pustulant drainage from site. If this is to occur please let me know immediately.     Orders: -     Cephalexin; Take 1 capsule (500 mg total) by mouth 3 (three) times daily for 10 days.  Dispense: 30 capsule; Refill: 0  Stasis dermatitis of both legs -     Ambulatory referral to Vascular Surgery  Expiratory wheezing on left side of chest Assessment &  Plan: Worry for pleural effusion due to being off of furosemide and worsening SOB Reviewed echo 01/31/22 recent BNP negative.  Advised pt if any worsening sob and or doe to seek emergent help  Orders: -     DG Chest 2 View; Future  Non-compliant patient Assessment & Plan: Pt again without  medications, unclear reason why as refills have been sent that should be covered in this time period. Resending all medications to pharmacy and advised pt again of compliance.    Other orders -     Furosemide; Take 1 tablet (40 mg total) by mouth daily.  Dispense: 90 tablet; Refill: 0     Follow up plan: Return in about 1 week (around 01/02/2023) for follow up shortness of breath .  Mort Sawyers, FNP

## 2022-12-26 NOTE — Telephone Encounter (Signed)
Patient would like to know if she could have a handicapped placard application filled out. She said if so she can come by tomorrow and pick it up to take to the [facility].

## 2022-12-26 NOTE — Assessment & Plan Note (Signed)
Pt again without medications, unclear reason why as refills have been sent that should be covered in this time period. Resending all medications to pharmacy and advised pt again of compliance.

## 2022-12-26 NOTE — Assessment & Plan Note (Signed)
Rx cephalexin 500 mg po tid x 10 days  Please monitor site for worsening signs/symptoms of infection to include: increasing redness, increasing tenderness, increase in size, and or pustulant drainage from site. If this is to occur please let me know immediately.

## 2022-12-26 NOTE — Assessment & Plan Note (Signed)
Worry for pleural effusion due to being off of furosemide and worsening SOB Reviewed echo 01/31/22 recent BNP negative.  Advised pt if any worsening sob and or doe to seek emergent help

## 2022-12-27 NOTE — Telephone Encounter (Signed)
Placard has been placed in Tabitha's in box.

## 2022-12-27 NOTE — Telephone Encounter (Signed)
Relayed message to patient that form was ready, patient stated she would try to stop by before 5pm today to pick up

## 2022-12-27 NOTE — Telephone Encounter (Signed)
Handicap placard has been signed and returned to me. LM for pt to return call.  Form has been placed up front for pick up.

## 2022-12-27 NOTE — Telephone Encounter (Signed)
Signed and in outbox.

## 2022-12-31 ENCOUNTER — Ambulatory Visit: Payer: Medicare HMO | Admitting: Family

## 2023-01-06 ENCOUNTER — Other Ambulatory Visit: Payer: Self-pay | Admitting: *Deleted

## 2023-01-06 DIAGNOSIS — I872 Venous insufficiency (chronic) (peripheral): Secondary | ICD-10-CM

## 2023-01-08 ENCOUNTER — Telehealth: Payer: Self-pay | Admitting: Family

## 2023-01-08 ENCOUNTER — Other Ambulatory Visit: Payer: Self-pay | Admitting: Family

## 2023-01-08 ENCOUNTER — Other Ambulatory Visit: Payer: Self-pay | Admitting: Podiatry

## 2023-01-08 NOTE — Telephone Encounter (Signed)
Patient called in and stated that the gabapentin (NEURONTIN) 300 MG capsule isn't helping much with the pain. She was wanting to know if the dosage could be increased or if she could get a narcotic for the pain. Please advise. Thank you!

## 2023-01-10 ENCOUNTER — Ambulatory Visit (HOSPITAL_COMMUNITY): Payer: Medicare HMO | Attending: Vascular Surgery

## 2023-01-10 NOTE — Telephone Encounter (Signed)
She can either increase to gabapentin 300 mg twice daily and OR gabapentin 600 mg at night time (two of the 300 mg tablets) if no relief still have her schedule follow up.

## 2023-01-10 NOTE — Telephone Encounter (Signed)
 Attempted to contact pt, there was no answer and her VM was full.

## 2023-01-13 NOTE — Telephone Encounter (Signed)
 Attempted to contact pt, there was no answer and her VM was full.

## 2023-01-14 NOTE — Telephone Encounter (Signed)
Attempted to contact pt but there was no answer and her VM was full.

## 2023-02-03 ENCOUNTER — Ambulatory Visit: Payer: Self-pay | Admitting: *Deleted

## 2023-02-03 ENCOUNTER — Ambulatory Visit: Payer: Medicare HMO | Admitting: Podiatry

## 2023-02-03 NOTE — Telephone Encounter (Signed)
Reason for Disposition  [1] MILD swelling of both ankles (i.e., pedal edema) AND [2] is a chronic symptom (recurrent or ongoing AND present > 4 weeks)  Answer Assessment - Initial Assessment Questions 1. ONSET: "When did the swelling start?" (e.g., minutes, hours, days)     Bilateral leg swelling up to knees.   This has been going on intermittently for over a year.    Pt is seen at [facility].   Requesting to transfer care to [facility].   She was last seen at [facility] 2022.   Last seen at [facility] for bilateral leg swelling on 12/26/2022. I confirmed she wanted to transfer care and she confirmed that she did. 2. LOCATION: "What part of the leg is swollen?"  "Are both legs swollen or just one leg?"     Both legs swollen up to knees. 3. SEVERITY: "How bad is the swelling?" (e.g., localized; mild, moderate, severe)   - Localized: Small area of swelling localized to one leg.   - MILD pedal edema: Swelling limited to foot and ankle, pitting edema < 1/4 inch (6 mm) deep, rest and elevation eliminate most or all swelling.   - MODERATE edema: Swelling of lower leg to knee, pitting edema > 1/4 inch (6 mm) deep, rest and elevation only partially reduce swelling.   - SEVERE edema: Swelling extends above knee, facial or hand swelling present.      Moderate 4. REDNESS: "Does the swelling look red or infected?"     Yes when I asked her about if they looked red. 5. PAIN: "Is the swelling painful to touch?" If Yes, ask: "How painful is it?"   (Scale 1-10; mild, moderate or severe)     Yes both legs 6. FEVER: "Do you have a fever?" If Yes, ask: "What is it, how was it measured, and when did it start?"      Not asked 7. CAUSE: "What do you think is causing the leg swelling?"     "I've had this problem on and off for over a year".   8. MEDICAL HISTORY: "Do you have a history of blood clots (e.g., DVT), cancer, heart failure, kidney disease, or liver failure?"      She didn't know what the cause was for the swelling.    "I've been in the hospital for it before".  9. RECURRENT SYMPTOM: "Have you had leg swelling before?" If Yes, ask: "When was the last time?" "What happened that time?"     Yes  see above 10. OTHER SYMPTOMS: "Do you have any other symptoms?" (e.g., chest pain, difficulty breathing)       No 11. PREGNANCY: "Is there any chance you are pregnant?" "When was your last menstrual period?"       Not asked due to age  Protocols used: Leg Swelling and Edema-A-AH  Chief Complaint: Bilateral leg swelling.   Wanting to transfer care from [facility] to [facility] Symptoms: above Frequency: intermittently for over a year Pertinent Negatives: Patient denies knowing the cause Disposition: [] ED /[] Urgent Care (no appt availability in office) / [x] Appointment(In office/virtual)/ []  [facility]/ [] Home Care/ [] Refused Recommended Disposition /[] [facility]/ []  Follow-up with PCP Additional Notes: First available appt made for 02/26/2023 at 2:30 with [facility] PA-A.
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
## 2023-02-21 ENCOUNTER — Ambulatory Visit: Payer: Medicare HMO

## 2023-02-26 ENCOUNTER — Encounter: Payer: Self-pay | Admitting: Physician Assistant

## 2023-02-26 ENCOUNTER — Ambulatory Visit: Payer: Medicare HMO | Attending: Physician Assistant | Admitting: Physician Assistant

## 2023-02-26 ENCOUNTER — Other Ambulatory Visit: Payer: Self-pay

## 2023-02-26 DIAGNOSIS — K219 Gastro-esophageal reflux disease without esophagitis: Secondary | ICD-10-CM

## 2023-02-26 DIAGNOSIS — E785 Hyperlipidemia, unspecified: Secondary | ICD-10-CM

## 2023-02-26 DIAGNOSIS — I10 Essential (primary) hypertension: Secondary | ICD-10-CM | POA: Diagnosis not present

## 2023-02-26 DIAGNOSIS — R600 Localized edema: Secondary | ICD-10-CM

## 2023-02-26 DIAGNOSIS — R0609 Other forms of dyspnea: Secondary | ICD-10-CM | POA: Diagnosis not present

## 2023-02-26 DIAGNOSIS — G8929 Other chronic pain: Secondary | ICD-10-CM

## 2023-02-26 DIAGNOSIS — M545 Low back pain, unspecified: Secondary | ICD-10-CM

## 2023-02-26 MED ORDER — LISINOPRIL 40 MG PO TABS
40.0000 mg | ORAL_TABLET | Freq: Every day | ORAL | 3 refills | Status: DC
  Filled 2023-02-26: qty 90, 90d supply, fill #0
  Filled 2023-06-06 – 2023-08-27 (×5): qty 90, 90d supply, fill #1

## 2023-02-26 MED ORDER — ROSUVASTATIN CALCIUM 40 MG PO TABS
40.0000 mg | ORAL_TABLET | Freq: Every day | ORAL | 3 refills | Status: DC
  Filled 2023-02-26 – 2023-06-23 (×5): qty 90, 90d supply, fill #0

## 2023-02-26 MED ORDER — METOPROLOL SUCCINATE ER 25 MG PO TB24
25.0000 mg | ORAL_TABLET | Freq: Every day | ORAL | 3 refills | Status: DC
  Filled 2023-02-26 – 2023-08-27 (×9): qty 90, 90d supply, fill #0

## 2023-02-26 MED ORDER — GABAPENTIN 300 MG PO CAPS
ORAL_CAPSULE | ORAL | 3 refills | Status: DC
  Filled 2023-02-26: qty 120, 30d supply, fill #0
  Filled 2023-04-26 – 2023-08-27 (×8): qty 120, 30d supply, fill #1

## 2023-02-26 MED ORDER — OMEPRAZOLE 40 MG PO CPDR
40.0000 mg | DELAYED_RELEASE_CAPSULE | Freq: Every day | ORAL | 3 refills | Status: DC
  Filled 2023-02-26 – 2023-06-23 (×5): qty 90, 90d supply, fill #0

## 2023-02-26 MED ORDER — POTASSIUM CHLORIDE CRYS ER 20 MEQ PO TBCR
20.0000 meq | EXTENDED_RELEASE_TABLET | Freq: Every day | ORAL | 3 refills | Status: DC
  Filled 2023-02-26 – 2023-06-23 (×5): qty 90, 90d supply, fill #0

## 2023-02-26 MED ORDER — FUROSEMIDE 40 MG PO TABS
40.0000 mg | ORAL_TABLET | Freq: Every day | ORAL | 0 refills | Status: DC
  Filled 2023-02-26 – 2023-06-23 (×5): qty 90, 90d supply, fill #0

## 2023-02-26 NOTE — Progress Notes (Signed)
Patient ID: Lorraine Ryan, female   DOB: [birth year], 68 y.o.   MRN: [id number]   Lorraine Ryan, is a 68 y.o. female  LKG:[id number]  GUY:[id number]  DOB - 1955-09-28  Chief Complaint  Patient presents with   Establish Care       Subjective:   Lorraine Ryan is a 68 y.o. female here today fto be assigned to a new PCP.  She had moved her care to NP Tabitah Dugal due to insurance but now able to come back here.  She has been out of all of her meds for about 2 weeks and has noticed legs swelling more than usual.  She denies any new issues or concerns.  She has had her flu shot.  No CP/SOB/dizziness.    No problems updated.  ALLERGIES: Allergies  Allergen Reactions   Codeine Other (See Comments)    Headache   Oxycontin [Oxycodone] Nausea Only and Palpitations   Prednisone Other (See Comments)    Headache   Ultram [Tramadol] Nausea Only    PAST MEDICAL HISTORY: Past Medical History:  Diagnosis Date   Anxiety    Arthritis    Back pain    Bipolar disorder (HCC)    Chronic headaches    Dementia (HCC)    Depression    Diabetes mellitus without complication (HCC)    GERD (gastroesophageal reflux disease)    Hypertension    No meds prescribed; states intermittent   Neuromuscular disorder (HCC)    Schizoaffective psychosis (HCC) 06/27/2020   Shortness of breath dyspnea    Stroke (HCC)    caused numbness in leg    MEDICATIONS AT HOME: Prior to Admission medications   Medication Sig Start Date End Date Taking? Authorizing Provider  furosemide (LASIX) 40 MG tablet Take 1 tablet (40 mg total) by mouth daily. 02/26/23   Anders Simmonds, PA-C  gabapentin (NEURONTIN) 300 MG capsule 1 in the morning, 1 at noon and 2 capsules at bedtime 02/26/23   Georgian Co M, PA-C  lisinopril (ZESTRIL) 40 MG tablet Take 1 tablet (40 mg total) by mouth daily. 02/26/23   Anders Simmonds, PA-C  metoprolol succinate (TOPROL-XL) 25 MG 24 hr tablet Take 1 tablet (25 mg total) by mouth daily.  02/26/23   Anders Simmonds, PA-C  omeprazole (PRILOSEC) 40 MG capsule Take 1 capsule (40 mg total) by mouth daily. 02/26/23   Anders Simmonds, PA-C  potassium chloride SA (KLOR-CON M20) 20 MEQ tablet Take 1 tablet (20 mEq total) by mouth daily. 02/26/23   Anders Simmonds, PA-C  rosuvastatin (CRESTOR) 40 MG tablet Take 1 tablet (40 mg total) by mouth daily. 02/26/23   Graesyn Schreifels, Marzella Schlein, PA-C    ROS: Neg HEENT Neg resp Neg cardiac Neg GI Neg GU Neg MS Neg psych Neg neuro  Objective:   Vitals:   02/26/23 1355  BP: (!) 162/105  Pulse: 65  SpO2: 97%  Weight: 284 lb 9.6 oz (129.1 kg)   Exam General appearance : Awake, alert, not in any distress. Speech Clear. Not toxic looking HEENT: Atraumatic and Normocephalic, pupils equally reactive to light and accomodation Neck: Supple, no JVD. No cervical lymphadenopathy.  Chest: Good air entry bilaterally, CTAB.  No rales/rhonchi/wheezing CVS: S1 S2 regular, no murmurs.  Extremities: B/L Lower Ext shows 1+ edema, both legs are warm to touch Neurology: Awake alert, and oriented X 3, CN II-XII intact, Non focal Skin: No Rash  Data Review Lab Results  Component Value Date   HGBA1C 6.0  06/19/2022   HGBA1C 5.7 (A) 07/03/2021   HGBA1C 5.6 06/28/2019    Assessment & Plan   1. Hyperlipidemia, unspecified hyperlipidemia type - rosuvastatin (CRESTOR) 40 MG tablet; Take 1 tablet (40 mg total) by mouth daily.  Dispense: 90 tablet; Refill: 3  2. Pedal edema Can try compression stockings - furosemide (LASIX) 40 MG tablet; Take 1 tablet (40 mg total) by mouth daily.  Dispense: 90 tablet; Refill: 0 - potassium chloride SA (KLOR-CON M20) 20 MEQ tablet; Take 1 tablet (20 mEq total) by mouth daily.  Dispense: 90 tablet; Refill: 3  3. Dyspnea on exertion - furosemide (LASIX) 40 MG tablet; Take 1 tablet (40 mg total) by mouth daily.  Dispense: 90 tablet; Refill: 0 - potassium chloride SA (KLOR-CON M20) 20 MEQ tablet; Take 1 tablet (20 mEq total)  by mouth daily.  Dispense: 90 tablet; Refill: 3  4. Bilateral lower extremity edema - furosemide (LASIX) 40 MG tablet; Take 1 tablet (40 mg total) by mouth daily.  Dispense: 90 tablet; Refill: 0 - potassium chloride SA (KLOR-CON M20) 20 MEQ tablet; Take 1 tablet (20 mEq total) by mouth daily.  Dispense: 90 tablet; Refill: 3  5. Gastroesophageal reflux disease without esophagitis stable - omeprazole (PRILOSEC) 40 MG capsule; Take 1 capsule (40 mg total) by mouth daily.  Dispense: 90 capsule; Refill: 3  6. Essential hypertension Uncontrolled but off meds X 2 weeks - metoprolol succinate (TOPROL-XL) 25 MG 24 hr tablet; Take 1 tablet (25 mg total) by mouth daily.  Dispense: 90 tablet; Refill: 3 - lisinopril (ZESTRIL) 40 MG tablet; Take 1 tablet (40 mg total) by mouth daily.  Dispense: 90 tablet; Refill: 3  7. Chronic midline low back pain without sciatica Not controlled-will increase bedtime dose - gabapentin (NEURONTIN) 300 MG capsule; 1 in the morning, 1 at noon and 2 capsules at bedtime  Dispense: 120 capsule; Refill: 3    Return in about 3 months (around 05/27/2023) for PCP for chronic conditions-please assign to one of our providers that serves as PCP.  The patient was given clear instructions to go to ER or return to medical center if symptoms don't improve, worsen or new problems develop. The patient verbalized understanding. The patient was told to call to get lab results if they haven't heard anything in the next week.      Georgian Co, PA-C [facility] [place], [place] [phone]   02/26/2023, 2:18 PM

## 2023-02-26 NOTE — Patient Instructions (Signed)
How to Use Compression Stockings  Compression stockings are elastic socks that help increase blood flow (circulation) to the legs, decrease swelling in the legs, and reduce the chance of developing blood clots in the lower legs. Compression stockings squeeze or apply pressure to the legs. The stockings are graduated, meaning the highest amount of pressure occurs at the toes and it decreases going toward the upper part of the leg. This helps ensure proper circulation through the veins. Compression stockings are often used by people who: Are recovering from surgery. The stockings help prevent blood clots after surgery. Have poor circulation or swelling in their legs because of a medical condition, such as chronic venous insufficiency, venous stasis, or lymphedema. Have a history of getting blood clots in their legs. Have bulging (varicose) veins. Sit or stay in bed for long periods of time (immobilization). Stand for long periods of time and experience leg pain or fatigue. Follow instructions from your health care provider about how and when to wear your compression stockings. What are the risks? Generally, compression stockings are safe to wear. However, problems may occur for some people, such as: The stockings being ineffective at increasing the circulation to the legs, decreasing swelling in the legs, or reducing the chance of developing blood clots in the lower legs. Skin complications, including breaks in the skin, open wounds, blisters, or dermatitis. How to wear compression stockings Before you put on your compression stockings: Make sure that they are the correct size and degree of compression. If you do not know your size or required grade of compression, ask your health care provider and follow the manufacturer's instructions that come with the stockings. Be sure they are the appropriate length for your medical needs. Compression stockings come in different lengths, including knee high,  thigh high, and even up to the waist. Make sure that the stockings are clean, dry, and in good condition. Check the stockings for rips and tears. Do not put them on if they are ripped or torn. Put your stockings on first thing in the morning, before you get out of bed. Keep them on for as long as your health care provider advises. Most people are told to remove their compression stockings at the end of the day before bed. When you are wearing your stockings: Keep them as smooth as possible. Do not allow them to bunch up. It is especially important to prevent the stockings from bunching up around your toes or behind your knees. Make sure that the toe holes are underneath the toes and the heel patches are positioned at the heels. Do not roll the stockings downward and leave them rolled down. This can decrease blood flow to your legs. Change the stockings right away if they become wet or dirty or if they have a bad smell. If you have chronic leg wounds, make sure the wounds are properly covered or dressed before putting on your compression stockings. When you take off your stockings, check your legs and feet for: Open sores. Red spots or other areas of discoloration. Swelling. General tips Do not stop wearing compression stockings. Talk to your health care provider if your stockings feel too tight. Wash your stockings often with mild detergent in cold or warm water. Also wash them whenever they get dirty or have a bad smell. Do not use bleach. Air-dry your stockings or dry them in a clothes dryer on low heat. It may be helpful to have two pairs so that you have a pair to  wear while the other is being washed. Replace your stockings every 3-6 months. If skin moisturizing is part of your treatment plan, apply lotion or cream at night so that your skin will be dry when you put on the stockings in the morning. It is harder to put the stockings on when you have lotion on your legs or feet. Wear nonskid  shoes or slip-resistant socks when walking while wearing compression stockings. If you have difficulty putting on or taking off the compression stockings, ask your health care provider about devices that may help make this easier. Contact a health care provider and remove your stockings if: You have a prickling or tingling feeling in your feet or legs. You have new open sores, red spots, or other skin changes on your feet or legs. You have swelling or pain that gets worse. Get help right away if: You have shortness of breath or chest pain. Your heartbeat is fast or irregular. You have new swelling, pain, or warmth in your leg. You have numbness or tingling in your lower legs that does not get better after you take the stockings off. Your toes or feet are unusually cold or turn a bluish color. You feel light-headed or dizzy. These symptoms may represent a serious problem that is an emergency. Do not wait to see if the symptoms will go away. Get medical help right away. Call your local emergency services (911 in the [place] Do not drive yourself to the hospital. Summary Compression stockings are elastic socks that are worn to treat a variety of symptoms and medical conditions such as venous insufficiency, venous stasis, or lymphedema. Compression stockings help increase blood flow (circulation) to the legs, decrease swelling in the legs, and reduce the chance of developing blood clots in the lower legs. Follow instructions from your health care provider about how and when to wear your compression stockings. Do not stop wearing your compression stockings without talking to your health care provider first. This information is not intended to replace advice given to you by your health care provider. Make sure you discuss any questions you have with your health care provider. Document Revised: 07/13/2020 Document Reviewed: 07/13/2020 Elsevier Patient Education  2024 [employer].

## 2023-02-27 NOTE — Progress Notes (Signed)
Noted  

## 2023-03-03 ENCOUNTER — Other Ambulatory Visit: Payer: Self-pay

## 2023-03-10 ENCOUNTER — Other Ambulatory Visit: Payer: Self-pay

## 2023-03-10 DIAGNOSIS — I872 Venous insufficiency (chronic) (peripheral): Secondary | ICD-10-CM

## 2023-03-11 ENCOUNTER — Ambulatory Visit: Payer: Medicare HMO | Admitting: Orthopaedic Surgery

## 2023-03-20 ENCOUNTER — Ambulatory Visit: Payer: Medicare HMO | Admitting: Orthopedic Surgery

## 2023-03-21 ENCOUNTER — Encounter: Payer: Medicare HMO | Admitting: Vascular Surgery

## 2023-03-21 ENCOUNTER — Ambulatory Visit (HOSPITAL_COMMUNITY): Payer: Medicare Other

## 2023-03-31 ENCOUNTER — Ambulatory Visit (INDEPENDENT_AMBULATORY_CARE_PROVIDER_SITE_OTHER): Payer: Medicare Other | Admitting: Podiatry

## 2023-03-31 ENCOUNTER — Encounter: Payer: Self-pay | Admitting: Podiatry

## 2023-03-31 DIAGNOSIS — M79675 Pain in left toe(s): Secondary | ICD-10-CM

## 2023-03-31 DIAGNOSIS — M79674 Pain in right toe(s): Secondary | ICD-10-CM

## 2023-03-31 DIAGNOSIS — B351 Tinea unguium: Secondary | ICD-10-CM | POA: Diagnosis not present

## 2023-03-31 NOTE — Progress Notes (Signed)
   Chief Complaint  Patient presents with   Nail Problem    "My toenails need to be trimmed down."    SUBJECTIVE Patient with a history of diabetes mellitus presents to office today complaining of elongated, thickened nails that cause pain while ambulating in shoes.  Patient is unable to trim their own nails. Patient is here for further evaluation and treatment.  Past Medical History:  Diagnosis Date   Anxiety    Arthritis    Back pain    Bipolar disorder (HCC)    Chronic headaches    Dementia (HCC)    Depression    Diabetes mellitus without complication (HCC)    GERD (gastroesophageal reflux disease)    Hypertension    No meds prescribed; states intermittent   Neuromuscular disorder (HCC)    Schizoaffective psychosis (HCC) 06/27/2020   Shortness of breath dyspnea    Stroke (HCC)    caused numbness in leg    Allergies  Allergen Reactions   Codeine Other (See Comments)    Headache   Oxycontin [Oxycodone] Nausea Only and Palpitations   Prednisone Other (See Comments)    Headache   Ultram [Tramadol] Nausea Only     OBJECTIVE General Patient is awake, alert, and oriented x 3 and in no acute distress. Derm Skin is dry and supple bilateral. Negative open lesions or macerations. Remaining integument unremarkable. Nails are tender, long, thickened and dystrophic with subungual debris, consistent with onychomycosis, 1-5 bilateral. No signs of infection noted. Vasc  DP and PT pedal pulses palpable bilaterally. Temperature gradient within normal limits.  Neuro Epicritic and protective threshold sensation diminished bilaterally.  Musculoskeletal Exam No symptomatic pedal deformities noted bilateral. Muscular strength within normal limits.  ASSESSMENT 1. Diabetes Mellitus w/ peripheral neuropathy 2.  Pain due to onychomycosis of toenails bilateral  PLAN OF CARE 1. Patient evaluated today. 2. Instructed to maintain good pedal hygiene and foot care. Stressed importance of  controlling blood sugar.  3. Mechanical debridement of nails 1-5 bilaterally performed using a nail nipper. Filed with dremel without incident.  4. Return to clinic in 3 mos.     Felecia Shelling, DPM [facility]  Dr. Felecia Shelling, DPM    [address] [place], [place] [address]                Office [phone]  Fax [phone]

## 2023-04-05 ENCOUNTER — Other Ambulatory Visit: Payer: Self-pay | Admitting: Family

## 2023-04-05 DIAGNOSIS — I10 Essential (primary) hypertension: Secondary | ICD-10-CM

## 2023-04-28 ENCOUNTER — Other Ambulatory Visit (HOSPITAL_COMMUNITY): Payer: Self-pay

## 2023-04-29 ENCOUNTER — Other Ambulatory Visit (HOSPITAL_COMMUNITY): Payer: Self-pay

## 2023-04-29 ENCOUNTER — Other Ambulatory Visit: Payer: Self-pay

## 2023-05-05 DIAGNOSIS — S46011A Strain of muscle(s) and tendon(s) of the rotator cuff of right shoulder, initial encounter: Secondary | ICD-10-CM | POA: Diagnosis not present

## 2023-05-07 ENCOUNTER — Other Ambulatory Visit (HOSPITAL_COMMUNITY): Payer: Self-pay

## 2023-05-12 ENCOUNTER — Other Ambulatory Visit (HOSPITAL_COMMUNITY): Payer: Self-pay

## 2023-05-15 ENCOUNTER — Other Ambulatory Visit: Payer: Self-pay

## 2023-05-15 ENCOUNTER — Ambulatory Visit: Admitting: Orthopedic Surgery

## 2023-05-23 ENCOUNTER — Ambulatory Visit: Payer: Self-pay

## 2023-05-23 NOTE — Telephone Encounter (Signed)
 Will forward to provider
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
## 2023-05-26 ENCOUNTER — Other Ambulatory Visit: Payer: Self-pay

## 2023-05-27 ENCOUNTER — Ambulatory Visit: Payer: Medicare HMO | Admitting: Internal Medicine

## 2023-06-06 ENCOUNTER — Other Ambulatory Visit: Payer: Self-pay

## 2023-06-17 ENCOUNTER — Other Ambulatory Visit: Payer: Self-pay

## 2023-06-24 ENCOUNTER — Encounter: Payer: Self-pay | Admitting: Pharmacist

## 2023-06-24 ENCOUNTER — Other Ambulatory Visit: Payer: Self-pay

## 2023-06-24 ENCOUNTER — Other Ambulatory Visit (HOSPITAL_COMMUNITY): Payer: Self-pay

## 2023-06-27 ENCOUNTER — Other Ambulatory Visit: Payer: Self-pay

## 2023-07-01 ENCOUNTER — Other Ambulatory Visit (HOSPITAL_COMMUNITY): Payer: Self-pay

## 2023-07-01 ENCOUNTER — Other Ambulatory Visit: Payer: Self-pay

## 2023-07-02 ENCOUNTER — Other Ambulatory Visit: Payer: Self-pay

## 2023-07-04 ENCOUNTER — Other Ambulatory Visit: Payer: Self-pay

## 2023-07-04 ENCOUNTER — Other Ambulatory Visit (HOSPITAL_COMMUNITY): Payer: Self-pay

## 2023-07-14 ENCOUNTER — Ambulatory Visit: Admitting: Podiatry

## 2023-07-15 DIAGNOSIS — I739 Peripheral vascular disease, unspecified: Secondary | ICD-10-CM | POA: Diagnosis not present

## 2023-07-15 DIAGNOSIS — M2011 Hallux valgus (acquired), right foot: Secondary | ICD-10-CM | POA: Diagnosis not present

## 2023-07-15 DIAGNOSIS — B351 Tinea unguium: Secondary | ICD-10-CM | POA: Diagnosis not present

## 2023-07-15 DIAGNOSIS — E1151 Type 2 diabetes mellitus with diabetic peripheral angiopathy without gangrene: Secondary | ICD-10-CM | POA: Diagnosis not present

## 2023-07-15 DIAGNOSIS — I872 Venous insufficiency (chronic) (peripheral): Secondary | ICD-10-CM | POA: Diagnosis not present

## 2023-07-15 DIAGNOSIS — M2042 Other hammer toe(s) (acquired), left foot: Secondary | ICD-10-CM | POA: Diagnosis not present

## 2023-07-15 DIAGNOSIS — M2012 Hallux valgus (acquired), left foot: Secondary | ICD-10-CM | POA: Diagnosis not present

## 2023-07-15 DIAGNOSIS — L84 Corns and callosities: Secondary | ICD-10-CM | POA: Diagnosis not present

## 2023-07-15 DIAGNOSIS — M2041 Other hammer toe(s) (acquired), right foot: Secondary | ICD-10-CM | POA: Diagnosis not present

## 2023-07-16 ENCOUNTER — Other Ambulatory Visit: Payer: Self-pay

## 2023-07-31 DIAGNOSIS — B351 Tinea unguium: Secondary | ICD-10-CM | POA: Diagnosis not present

## 2023-08-04 ENCOUNTER — Other Ambulatory Visit (HOSPITAL_COMMUNITY): Payer: Self-pay

## 2023-08-05 ENCOUNTER — Other Ambulatory Visit (HOSPITAL_BASED_OUTPATIENT_CLINIC_OR_DEPARTMENT_OTHER): Payer: Self-pay

## 2023-08-05 ENCOUNTER — Other Ambulatory Visit: Payer: Self-pay

## 2023-08-05 ENCOUNTER — Other Ambulatory Visit (HOSPITAL_COMMUNITY): Payer: Self-pay

## 2023-08-07 ENCOUNTER — Other Ambulatory Visit: Payer: Self-pay

## 2023-08-27 ENCOUNTER — Other Ambulatory Visit: Payer: Self-pay

## 2023-08-27 ENCOUNTER — Other Ambulatory Visit (HOSPITAL_COMMUNITY): Payer: Self-pay

## 2023-09-03 ENCOUNTER — Ambulatory Visit (INDEPENDENT_AMBULATORY_CARE_PROVIDER_SITE_OTHER)

## 2023-09-03 ENCOUNTER — Ambulatory Visit (INDEPENDENT_AMBULATORY_CARE_PROVIDER_SITE_OTHER): Admitting: Podiatry

## 2023-09-03 ENCOUNTER — Encounter: Payer: Self-pay | Admitting: Podiatry

## 2023-09-03 VITALS — Ht 66.0 in | Wt 284.6 lb

## 2023-09-03 DIAGNOSIS — S93601A Unspecified sprain of right foot, initial encounter: Secondary | ICD-10-CM | POA: Diagnosis not present

## 2023-09-03 DIAGNOSIS — M7751 Other enthesopathy of right foot: Secondary | ICD-10-CM

## 2023-09-03 DIAGNOSIS — I82461 Acute embolism and thrombosis of right calf muscular vein: Secondary | ICD-10-CM

## 2023-09-03 NOTE — Progress Notes (Signed)
 Chief Complaint  Patient presents with   Foot Pain    Pt is here due to right foot swelling she states the foot has been swollen for a while, states she fell on 7/16 or 7/17 states no pain just concern with the swelling, pt is a diabetic.    HPI: 68 y.o. female PMHx diabetes mellitus presenting today for acute onset of lower extremity edema to the right lower extremity compared to contralateral limb.  She states that she sustained a fall about 2 weeks ago.  Currently she has no pain in the foot.  She has noted significant swelling to the right leg compared to the left  Past Medical History:  Diagnosis Date   Anxiety    Arthritis    Back pain    Bipolar disorder (HCC)    Chronic headaches    Dementia (HCC)    Depression    Diabetes mellitus without complication (HCC)    GERD (gastroesophageal reflux disease)    Hypertension    No meds prescribed; states intermittent   Neuromuscular disorder (HCC)    Schizoaffective psychosis (HCC) 06/27/2020   Shortness of breath dyspnea    Stroke (HCC)    caused numbness in leg    Past Surgical History:  Procedure Laterality Date   ABDOMINAL HYSTERECTOMY     no cervix   COLONOSCOPY WITH PROPOFOL  N/A 04/18/2015   DOQ:zkuzmjwo hemorrhoids/diverticulosis sigmoid colon/   ESOPHAGOGASTRODUODENOSCOPY (EGD) WITH PROPOFOL  N/A 04/18/2015   SLF:web in the proximal esophagus/dilated/   GANGLION CYST EXCISION     LUMBAR LAMINECTOMY/DECOMPRESSION MICRODISCECTOMY N/A 02/08/2015   Procedure: L4-5 Decompression;  Surgeon: Oneil JAYSON Herald, MD;  Location: [facility] OR;  Service: Orthopedics;  Laterality: N/A;   POLYPECTOMY  04/18/2015   Procedure: POLYPECTOMY;  Surgeon: Margo LITTIE Haddock, MD;  Location: [facility];  Service: Endoscopy;;  ascending colon polyp   TONSILLECTOMY      Allergies  Allergen Reactions   Codeine  Other (See Comments)    Headache   Oxycontin  [Oxycodone ] Nausea Only and Palpitations   Prednisone  Other (See Comments)    Headache   Ultram   [Tramadol ] Nausea Only     Physical Exam: General: The patient is alert and oriented x3 in no acute distress.  Dermatology: Skin is warm, dry and supple bilateral lower extremities.  Skin is frail and waxy/shiny in appearance bilateral lower extremities  Vascular: Palpable pedal pulses bilaterally. Capillary refill within normal limits.  Heavy edema noted bilateral, right significantly greater than the left  Neurological: Light touch and protective threshold diminished  Musculoskeletal Exam: Significant tenderness with calf compression of the right lower extremity compared to the left.  Concerning for possible acute DVT  Radiographic Exam RT foot 09/03/2023:  No osseous irregularities.  Flatfoot deformity noted with moderate arthritic degenerative changes.  Impression: Negative for fracture  Assessment/Plan of Care: 1.  Diabetes mellitus with peripheral polyneuropathy 2.  Heavy edema bilateral lower extremities.  RT greater than LT 3.  Concern for possible DVT right lower extremity  -Patient evaluated.  X-rays reviewed -Order placed for venous Doppler; stat -Recommend follow-up with PCP for possible diuretic management to reduce the edema -Patient has compression stockings at home.  Recommend wearing daily.  Currently she states that she does not wear them -Return to clinic with any PRN      Thresa EMERSON Sar, DPM [facility]  Dr. Thresa EMERSON Sar, DPM    [address]  [place], [place] [address]                Office [phone]  Fax [phone]

## 2023-09-05 ENCOUNTER — Telehealth (HOSPITAL_COMMUNITY): Payer: Self-pay

## 2023-09-05 NOTE — Telephone Encounter (Signed)
 Attempted to contact the patient to schedule VAS US .  No answer.  Left message.  Third Attempt. Provided  direct contact number for scheduling: [phone].   Attempted to contact the following patient three times over 3 days attempting to schedule their STAT VAS US  order. I have been unable to reach patient for scheduling purposes, will inform referring office. If I do not hear back from the patient the order is to be canceled per protocol.  09/05/23 lvm /dd 09/04/23 LVM TO CB TO [facility] /FC 09/03/23 LVM TO CB TO [facility] /FC No pa required per auth status note

## 2023-09-12 DIAGNOSIS — M25511 Pain in right shoulder: Secondary | ICD-10-CM | POA: Diagnosis not present

## 2023-09-16 ENCOUNTER — Ambulatory Visit: Admitting: Nurse Practitioner

## 2023-09-23 ENCOUNTER — Other Ambulatory Visit (HOSPITAL_BASED_OUTPATIENT_CLINIC_OR_DEPARTMENT_OTHER): Payer: Self-pay

## 2023-09-23 ENCOUNTER — Other Ambulatory Visit: Payer: Self-pay

## 2023-09-23 ENCOUNTER — Other Ambulatory Visit (HOSPITAL_COMMUNITY): Payer: Self-pay

## 2023-09-24 ENCOUNTER — Telehealth: Payer: Self-pay | Admitting: Internal Medicine

## 2023-09-24 DIAGNOSIS — I10 Essential (primary) hypertension: Secondary | ICD-10-CM

## 2023-09-24 DIAGNOSIS — G8929 Other chronic pain: Secondary | ICD-10-CM

## 2023-09-24 MED ORDER — LISINOPRIL 40 MG PO TABS
40.0000 mg | ORAL_TABLET | Freq: Every day | ORAL | 0 refills | Status: DC
Start: 2023-09-24 — End: 2023-12-16

## 2023-09-24 MED ORDER — METOPROLOL SUCCINATE ER 25 MG PO TB24: 25.0000 mg | ORAL_TABLET | Freq: Every day | ORAL | 0 refills | Status: DC

## 2023-09-24 MED ORDER — GABAPENTIN 300 MG PO CAPS: ORAL_CAPSULE | ORAL | 0 refills | Status: DC

## 2023-09-24 NOTE — Telephone Encounter (Signed)
 Let patient know that I received a request from [facility] pharmacy for refills on medications.  I have not seen her in several years. 1 mth refill sent.  She needs an appointment for any additional refills.

## 2023-09-24 NOTE — Telephone Encounter (Signed)
 Called but no answer. LVM informing that a one month refill for requested medications has been sent to the pharmacy. Advised to call back to schedule a follow-up appointment for additional refills.

## 2023-09-25 NOTE — Telephone Encounter (Signed)
 Noted! Thank you

## 2023-09-25 NOTE — Telephone Encounter (Signed)
 Called patient verified name and date of birth. Informed patient that 1 month refill has been sent to the pharmacy. Patient follow-up appointment has been scheduled.

## 2023-09-26 DIAGNOSIS — S46011D Strain of muscle(s) and tendon(s) of the rotator cuff of right shoulder, subsequent encounter: Secondary | ICD-10-CM | POA: Diagnosis not present

## 2023-10-08 ENCOUNTER — Ambulatory Visit: Admitting: Podiatry

## 2023-10-09 ENCOUNTER — Ambulatory Visit: Payer: Self-pay
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
## 2023-10-12 ENCOUNTER — Other Ambulatory Visit: Payer: Self-pay | Admitting: Medical Genetics

## 2023-10-14 ENCOUNTER — Other Ambulatory Visit (HOSPITAL_COMMUNITY): Payer: Self-pay

## 2023-10-15 ENCOUNTER — Other Ambulatory Visit (HOSPITAL_COMMUNITY): Payer: Self-pay

## 2023-10-17 ENCOUNTER — Other Ambulatory Visit: Payer: Self-pay | Admitting: Internal Medicine

## 2023-10-17 DIAGNOSIS — G8929 Other chronic pain: Secondary | ICD-10-CM

## 2023-10-28 ENCOUNTER — Ambulatory Visit: Payer: Self-pay

## 2023-10-28 NOTE — Telephone Encounter (Signed)
 Noted
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
## 2023-10-29 ENCOUNTER — Ambulatory Visit: Admitting: Nurse Practitioner

## 2023-11-12 ENCOUNTER — Ambulatory Visit: Admitting: Podiatry

## 2023-11-12 ENCOUNTER — Other Ambulatory Visit

## 2023-11-13 ENCOUNTER — Ambulatory Visit: Admitting: Internal Medicine

## 2023-11-18 ENCOUNTER — Other Ambulatory Visit: Payer: Self-pay | Admitting: Internal Medicine

## 2023-11-18 DIAGNOSIS — G8929 Other chronic pain: Secondary | ICD-10-CM

## 2023-11-18 DIAGNOSIS — I10 Essential (primary) hypertension: Secondary | ICD-10-CM

## 2023-11-21 ENCOUNTER — Other Ambulatory Visit: Payer: Self-pay | Admitting: Internal Medicine

## 2023-11-21 DIAGNOSIS — I10 Essential (primary) hypertension: Secondary | ICD-10-CM

## 2023-11-21 DIAGNOSIS — M545 Low back pain, unspecified: Secondary | ICD-10-CM

## 2023-11-21 NOTE — Telephone Encounter (Signed)
 Copied from CRM #[id number]. Topic: Clinical - Medication Refill >> Nov 21, 2023  3:16 PM Pinkey ORN wrote: Medication: lisinopril  (ZESTRIL ) 40 MG tablet, gabapentin  (NEURONTIN ) 300 MG capsule,  metoprolol  succinate (TOPROL -XL) 25 MG 24 hr tablet  Has the patient contacted their pharmacy? Yes (Agent: If no, request that the patient contact the pharmacy for the refill. If patient does not wish to contact the pharmacy document the reason why and proceed with request.) (Agent: If yes, when and what did the pharmacy advise?)  This is the patient's preferred pharmacy:   [facility] [place], [place] - [address] [place] [address] Phone: [phone] Fax: [phone]  Is this the correct pharmacy for this prescription? Yes If no, delete pharmacy and type the correct one.   Has the prescription been filled recently? No  Is the patient out of the medication? Yes  Has the patient been seen for an appointment in the last year OR does the patient have an upcoming appointment? Yes  Can we respond through MyChart? Yes  Agent: Please be advised that Rx refills may take up to 3 business days. We ask that you follow-up with your pharmacy.

## 2023-11-24 NOTE — Telephone Encounter (Signed)
 Duplicate request, OV needed.  Requested Prescriptions  Pending Prescriptions Disp Refills   gabapentin  (NEURONTIN ) 300 MG capsule 120 capsule 0     Neurology: Anticonvulsants - gabapentin  Passed - 11/24/2023  4:08 PM      Passed - Cr in normal range and within 360 days    Creat  Date Value Ref Range Status  12/06/2022 0.76 0.50 - 1.05 mg/dL Final         Passed - Completed PHQ-2 or PHQ-9 in the last 360 days      Passed - Valid encounter within last 12 months    Recent Outpatient Visits           9 months ago Hyperlipidemia, unspecified hyperlipidemia type   [facility] Dept Of [place]. [facility] [place], [place], [place]   3 years ago Pedal edema   [facility] [place] - A Dept Of [place]. [facility], [place], [place]   3 years ago Essential hypertension   [facility] [place] - A Dept Of [place]. [facility] Delbert Clam, MD   4 years ago Pain and swelling of right lower leg   [facility] [place] - A Dept Of [place]. [facility] Alec House, MD   4 years ago No-show for appointment   [facility] [place] - A Dept Of [place]. [facility] Vicci Sober B, MD               lisinopril  (ZESTRIL ) 40 MG tablet 30 tablet 0    Sig: Take 1 tablet (40 mg total) by mouth daily.     Cardiovascular:  ACE Inhibitors Failed - 11/24/2023  4:08 PM      Failed - Cr in normal range and within 180 days    Creat  Date Value Ref Range Status  12/06/2022 0.76 0.50 - 1.05 mg/dL Final         Failed - K in normal range and within 180 days    Potassium  Date Value Ref Range Status  12/06/2022 4.0 3.5 - 5.3 mmol/L Final         Failed - Last BP in normal range    BP Readings from Last 1 Encounters:  02/26/23 (!) 159/97         Failed - Valid encounter within last 6 months    Recent Outpatient Visits           9 months ago Hyperlipidemia,  unspecified hyperlipidemia type   [facility] - [facility] Dept Of [place]. [facility] [place], [place] HER[place], [place]   3 years ago Pedal edema   [facility] [place] - A Dept Of [place]. [facility], [place] HER[place], [place]   3 years ago Essential hypertension    [facility] - A Dept Of [place]. [facility] Delbert Clam, MD   4 years ago Pain and swelling of right lower leg    [facility] - A Dept Of [place]. [facility] Alec House, MD   4 years ago No-show for appointment   [facility] - A Dept Of [place]. [facility] Vicci Sober NOVAK, MD              Passed - Patient is not pregnant       metoprolol   succinate (TOPROL -XL) 25 MG 24 hr tablet 30 tablet 0    Sig: Take 1 tablet (25 mg total) by mouth daily.     Cardiovascular:  Beta Blockers Failed - 11/24/2023  4:08 PM      Failed - Last BP in normal range    BP Readings from Last 1 Encounters:  02/26/23 (!) 159/97         Failed - Valid encounter within last 6 months    Recent Outpatient Visits           9 months ago Hyperlipidemia, unspecified hyperlipidemia type   [place] [facility] [facility] [place] - A Dept Of [place]. [facility] [place], [place] HER[place], [place]   3 years ago Pedal edema   [place] [facility] [place] - A Dept Of [place]. [facility], [place], [place]   3 years ago Essential hypertension   [place] [facility] [place] - A Dept Of [place]. [facility] Delbert Clam, MD   4 years ago Pain and swelling of right lower leg   [place] [facility] [place] - A Dept Of [place]. [facility] Alec House, MD   4 years ago No-show for appointment   [facility] - A Dept Of [place]. [facility] Vicci Barnie NOVAK, MD              Passed - Last Heart Rate in normal range    Pulse Readings from Last 1  Encounters:  02/26/23 65

## 2023-11-26 ENCOUNTER — Other Ambulatory Visit: Payer: Self-pay | Admitting: Internal Medicine

## 2023-11-26 DIAGNOSIS — M545 Low back pain, unspecified: Secondary | ICD-10-CM

## 2023-11-26 DIAGNOSIS — I10 Essential (primary) hypertension: Secondary | ICD-10-CM

## 2023-11-26 NOTE — Telephone Encounter (Unsigned)
 Copied from CRM [id number]. Topic: Clinical - Medication Refill >> Nov 26, 2023  3:22 PM Hadassah PARAS wrote: Medication: gabapentin  (NEURONTIN ) 300 MG capsule  lisinopril  (ZESTRIL ) 40 MG tablet  metoprolol  succinate (TOPROL -XL) 25 MG 24 hr tablet   Has the patient contacted their pharmacy? Yes (Agent: If no, request that the patient contact the pharmacy for the refill. If patient does not wish to contact the pharmacy document the reason why and proceed with request.) (Agent: If yes, when and what did the pharmacy advise?)  This is the patient's preferred pharmacy:  [facility] [address] [place] [address] Phone: [phone] Fax: [phone]  [facility] - [facility] [address] [place] [address] Phone: [phone] Fax: [phone]  [facility] - [place], [place] - [address] [place] [address] Phone: [phone] Fax: [phone]  Is this the correct pharmacy for this prescription? Yes If no, delete pharmacy and type the correct one.   Has the prescription been filled recently? No  Is the patient out of the medication? Yes  Has the patient been seen for an appointment in the last year OR does the patient have an upcoming appointment? Yes  Can we respond through MyChart? No, please send escript  Agent: Please be advised that Rx refills may take up to 3 business days. We ask that you follow-up with your pharmacy.

## 2023-11-28 NOTE — Telephone Encounter (Signed)
 Requested medications are due for refill today.  yes  Requested medications are on the active medications list.  yes  Last refill. varied  Future visit scheduled.   no  Notes to clinic.  Pt already given courtesy refills. Pt has missed scheduled appts.    Requested Prescriptions  Pending Prescriptions Disp Refills   gabapentin  (NEURONTIN ) 300 MG capsule 120 capsule 0     Neurology: Anticonvulsants - gabapentin  Passed - 11/28/2023 12:00 PM      Passed - Cr in normal range and within 360 days    Creat  Date Value Ref Range Status  12/06/2022 0.76 0.50 - 1.05 mg/dL Final         Passed - Completed PHQ-2 or PHQ-9 in the last 360 days      Passed - Valid encounter within last 12 months    Recent Outpatient Visits           9 months ago Hyperlipidemia, unspecified hyperlipidemia type   [facility] - [facility] Dept Of [place]. [facility] [employer], [place] HERO, [place]   3 years ago Pedal edema   [facility] [place] - A Dept Of [place]. [facility], [place] HERO, [place]   3 years ago Essential hypertension   [place] [facility] [place] - A Dept Of [place]. [facility] Delbert Clam, MD   4 years ago Pain and swelling of right lower leg   [place] [facility] - A Dept Of [place]. [facility] Alec House, MD   4 years ago No-show for appointment   [facility] - A Dept Of [place]. [facility] Vicci Sober B, MD               lisinopril  (ZESTRIL ) 40 MG tablet 30 tablet 0    Sig: Take 1 tablet (40 mg total) by mouth daily.     Cardiovascular:  ACE Inhibitors Failed - 11/28/2023 12:00 PM      Failed - Cr in normal range and within 180 days    Creat  Date Value Ref Range Status  12/06/2022 0.76 0.50 - 1.05 mg/dL Final         Failed - K in normal range and within 180 days    Potassium  Date Value Ref Range Status  12/06/2022 4.0 3.5 - 5.3 mmol/L Final          Failed - Last BP in normal range    BP Readings from Last 1 Encounters:  02/26/23 (!) 159/97         Failed - Valid encounter within last 6 months    Recent Outpatient Visits           9 months ago Hyperlipidemia, unspecified hyperlipidemia type   [facility] [place] - A Dept Of [place]. [facility], [place] HERO, [place]   3 years ago Pedal edema   [facility] [facility] [place] - A Dept Of [place]. [facility], [place] HER[place], [place]   3 years ago Essential hypertension   [facility] [facility] [place] - A Dept Of [place]. [facility] Delbert Clam, MD   4 years ago Pain and swelling of right lower leg   [facility] [place] - A Dept Of . [facility] Alec House, MD   4 years ago No-show for appointment   [facility] - A Dept Of [place]. [facility] Vicci Barnie NOVAK, MD              Passed - Patient is not pregnant       metoprolol  succinate (TOPROL -XL) 25 MG 24 hr tablet 30 tablet 0    Sig: Take 1 tablet (25 mg total) by mouth daily.     Cardiovascular:  Beta Blockers Failed - 11/28/2023 12:00 PM      Failed - Last BP in normal range    BP Readings from Last 1 Encounters:  02/26/23 (!) 159/97         Failed - Valid encounter within last 6 months    Recent Outpatient Visits           9 months ago Hyperlipidemia, unspecified hyperlipidemia type   [facility] [place] - A Dept Of [place]. [facility] [place], [place], [place]   3 years ago Pedal edema   [place] - A Dept Of [place]. [facility], [place] HER[place], [place]   3 years ago Essential hypertension   [facility] [place] - A Dept Of [place]. [facility] Delbert Clam, MD   4 years ago Pain and swelling of right lower leg   [facility] [facility] [place] [place] - A Dept Of [place]. [facility] Alec House, MD    4 years ago No-show for appointment   [facility] - A Dept Of [place]. [facility] Vicci Barnie NOVAK, MD              Passed - Last Heart Rate in normal range    Pulse Readings from Last 1 Encounters:  02/26/23 65

## 2023-12-01 ENCOUNTER — Encounter: Payer: Self-pay | Admitting: Podiatry

## 2023-12-01 ENCOUNTER — Ambulatory Visit (HOSPITAL_COMMUNITY)
Admission: RE | Admit: 2023-12-01 | Discharge: 2023-12-01 | Disposition: A | Discharge status: Home / Self Care | Source: Ambulatory Visit | Attending: Podiatry | Admitting: Podiatry

## 2023-12-01 ENCOUNTER — Ambulatory Visit (INDEPENDENT_AMBULATORY_CARE_PROVIDER_SITE_OTHER): Admitting: Podiatry

## 2023-12-01 VITALS — Ht 66.0 in | Wt 284.6 lb

## 2023-12-01 DIAGNOSIS — I82461 Acute embolism and thrombosis of right calf muscular vein: Secondary | ICD-10-CM | POA: Diagnosis not present

## 2023-12-01 NOTE — Progress Notes (Signed)
 No chief complaint on file.   HPI: 68 y.o. female PMHx diabetes mellitus, HTN, chronic lower extremity edema presenting for follow-up evaluation of right lower extremity increased swelling with calf pain.  Last seen in the office 09/03/2023.  At that time stat venous Doppler was ordered to rule out DVT however she was not able to be contacted.  She continues to have right leg swelling with leg pain.  Past Medical History:  Diagnosis Date   Anxiety    Arthritis    Back pain    Bipolar disorder (HCC)    Chronic headaches    Dementia (HCC)    Depression    Diabetes mellitus without complication (HCC)    GERD (gastroesophageal reflux disease)    Hypertension    No meds prescribed; states intermittent   Neuromuscular disorder (HCC)    Schizoaffective psychosis (HCC) 06/27/2020   Shortness of breath dyspnea    Stroke (HCC)    caused numbness in leg    Past Surgical History:  Procedure Laterality Date   ABDOMINAL HYSTERECTOMY     no cervix   COLONOSCOPY WITH PROPOFOL  N/A 04/18/2015   DOQ:zkuzmjwo hemorrhoids/diverticulosis sigmoid colon/   ESOPHAGOGASTRODUODENOSCOPY (EGD) WITH PROPOFOL  N/A 04/18/2015   SLF:web in the proximal esophagus/dilated/   GANGLION CYST EXCISION     LUMBAR LAMINECTOMY/DECOMPRESSION MICRODISCECTOMY N/A 02/08/2015   Procedure: L4-5 Decompression;  Surgeon: Oneil JAYSON Herald, MD;  Location: [facility] OR;  Service: Orthopedics;  Laterality: N/A;   POLYPECTOMY  04/18/2015   Procedure: POLYPECTOMY;  Surgeon: Margo LITTIE Haddock, MD;  [facility];  Service: Endoscopy;;  ascending colon polyp   TONSILLECTOMY      Allergies  Allergen Reactions   Codeine  Other (See Comments)    Headache   Oxycontin  [Oxycodone ] Nausea Only and Palpitations   Prednisone  Other (See Comments)    Headache   Ultram  [Tramadol ] Nausea Only     Physical Exam: General: The patient is alert and oriented x3 in no acute distress.  Dermatology: Unchanged.  Skin is warm, dry and supple bilateral  lower extremities.  Skin is frail and waxy/shiny in appearance bilateral lower extremities  Vascular: Palpable pedal pulses bilaterally. Capillary refill within normal limits.  Heavy edema noted bilateral, right significantly greater than the left  Neurological: Light touch and protective threshold diminished  Musculoskeletal Exam: Unchanged.  Significant tenderness with calf compression of the right lower extremity compared to the left.  Concerning for possible DVT  Radiographic Exam RT foot 09/03/2023:  No osseous irregularities.  Flatfoot deformity noted with moderate arthritic degenerative changes.  Impression: Negative for fracture  Assessment/Plan of Care: 1.  Diabetes mellitus with peripheral polyneuropathy 2.  Heavy edema bilateral lower extremities.  RT greater than LT 3.  Concern for possible DVT right lower extremity  -Patient evaluated.  -Heart and vascular was contacted today.  They made an immediate appointment for her and she can go directly to the heart and vascular Center.  Instructions and directions were provided for the patient to go for the venous Doppler at heart and vascular.  Greatly appreciated -Patient has compression stockings at home.  Recommend wearing daily.  Currently she states that she does not wear them due to pain -Return to clinic with any PRN.  Will plan to contact the patient after Doppler results are available to review and discuss further treatment      Thresa EMERSON Sar, DPM [facility]  Dr. Thresa EMERSON Sar, DPM    [address] [place], [place] [address]                Office [phone]  Fax [phone]

## 2023-12-03 ENCOUNTER — Other Ambulatory Visit: Payer: Self-pay | Admitting: Internal Medicine

## 2023-12-03 DIAGNOSIS — G8929 Other chronic pain: Secondary | ICD-10-CM

## 2023-12-03 DIAGNOSIS — I10 Essential (primary) hypertension: Secondary | ICD-10-CM

## 2023-12-03 NOTE — Telephone Encounter (Unsigned)
 Copied from CRM #[id number]. Topic: Clinical - Medication Refill >> Dec 03, 2023 12:59 PM Zebedee SAUNDERS wrote: Medication: lisinopril  (ZESTRIL ) 40 MG tablet, metoprolol  succinate (TOPROL -XL) 25 MG 24 hr tablet, gabapentin  (NEURONTIN ) 300 MG capsule  Has the patient contacted their pharmacy? Yes (Agent: If no, request that the patient contact the pharmacy for the refill. If patient does not wish to contact the pharmacy document the reason why and proceed with request.) (Agent: If yes, when and what did the pharmacy advise?)  This is the patient's preferred pharmacy:  [facility] [address] [place] [address] Phone: [phone] Fax: [phone]  [facility] - [facility] [address] [place] [address] Phone: [phone] Fax: [phone]  [facility] [place] - [address] [place] [address] Phone: [phone] Fax: [phone]  Is this the correct pharmacy for this prescription? Yes If no, delete pharmacy and type the correct one.   Has the prescription been filled recently? Yes  Is the patient out of the medication? Yes  Has the patient been seen for an appointment in the last year OR does the patient have an upcoming appointment? Yes  Can we respond through MyChart? Yes  Agent: Please be advised that Rx refills may take up to 3 business days. We ask that you follow-up with your pharmacy.

## 2023-12-05 ENCOUNTER — Other Ambulatory Visit: Payer: Self-pay | Admitting: Internal Medicine

## 2023-12-05 DIAGNOSIS — I10 Essential (primary) hypertension: Secondary | ICD-10-CM

## 2023-12-05 NOTE — Telephone Encounter (Signed)
 Copied from CRM [id number]. Topic: Clinical - Medication Refill >> Dec 05, 2023 12:28 PM Zebedee SAUNDERS wrote: Medication: lisinopril  (ZESTRIL ) 40 MG tablet  Has the patient contacted their pharmacy? Yes (Agent: If no, request that the patient contact the pharmacy for the refill. If patient does not wish to contact the pharmacy document the reason why and proceed with request.) (Agent: If yes, when and what did the pharmacy advise?)  This is the patient's preferred pharmacy:  [facility] [address] [place] [address] Phone: [phone] Fax: [phone]  [facility] [place] - [address] [place] [address] Phone: [phone] Fax: [phone]  Is this the correct pharmacy for this prescription? Yes If no, delete pharmacy and type the correct one.   Has the prescription been filled recently? Yes  Is the patient out of the medication? Yes  Has the patient been seen for an appointment in the last year OR does the patient have an upcoming appointment? Yes  Can we respond through MyChart? Yes  Agent: Please be advised that Rx refills may take up to 3 business days. We ask that you follow-up with your pharmacy.

## 2023-12-05 NOTE — Telephone Encounter (Signed)
 Duplicate request, OV needed.  Requested Prescriptions  Pending Prescriptions Disp Refills   gabapentin  (NEURONTIN ) 300 MG capsule 120 capsule 0     Neurology: Anticonvulsants - gabapentin  Failed - 12/05/2023  9:04 AM      Failed - Cr in normal range and within 360 days    Creat  Date Value Ref Range Status  12/06/2022 0.76 0.50 - 1.05 mg/dL Final         Passed - Completed PHQ-2 or PHQ-9 in the last 360 days      Passed - Valid encounter within last 12 months    Recent Outpatient Visits           9 months ago Hyperlipidemia, unspecified hyperlipidemia type   [facility] [place]. [facility], [place] HERO, [place]   3 years ago Pedal edema   [facility] - [facility] Dept Of [place]. [facility], Jon HERO, [place]   3 years ago Essential hypertension   [facility] - [facility] Dept Of [place]. [facility] Delbert Clam, [place]   4 years ago Pain and swelling of right lower leg   [facility] [place] - [facility] Dept Of [place]. [facility] Alec House, [place]   4 years ago No-show for appointment   [facility] - A Dept Of [place]. [facility] Vicci Sober B, MD               lisinopril  (ZESTRIL ) 40 MG tablet 30 tablet 0    Sig: Take 1 tablet (40 mg total) by mouth daily.     Cardiovascular:  ACE Inhibitors Failed - 12/05/2023  9:04 AM      Failed - Cr in normal range and within 180 days    Creat  Date Value Ref Range Status  12/06/2022 0.76 0.50 - 1.05 mg/dL Final         Failed - K in normal range and within 180 days    Potassium  Date Value Ref Range Status  12/06/2022 4.0 3.5 - 5.3 mmol/L Final         Failed - Last BP in normal range    BP Readings from Last 1 Encounters:  02/26/23 (!) 159/97         Failed - Valid encounter within last 6 months    Recent Outpatient Visits           9 months ago Hyperlipidemia,  unspecified hyperlipidemia type   [facility] [place] [place]. [facility] [place], Jon HERO, [place]   3 years ago Pedal edema   [facility] [place] - [facility] Dept Of [place]. [facility], Jon HERO, [place]   3 years ago Essential hypertension   [facility] - [facility] Dept Of [place]. [facility] Delbert Clam, [place]   4 years ago Pain and swelling of right lower leg    [facility] [place] - [facility] Dept Of [place]. [facility] Alec House, MD   4 years ago No-show for appointment   [facility] [place] - A Dept Of . [facility] Vicci Sober NOVAK, MD              Passed - Patient is not pregnant       metoprolol   succinate (TOPROL -XL) 25 MG 24 hr tablet 30 tablet 0    Sig: Take 1 tablet (25 mg total) by mouth daily.     Cardiovascular:  Beta Blockers Failed - 12/05/2023  9:04 AM      Failed - Last BP in normal range    BP Readings from Last 1 Encounters:  02/26/23 (!) 159/97         Failed - Valid encounter within last 6 months    Recent Outpatient Visits           9 months ago Hyperlipidemia, unspecified hyperlipidemia type   [facility] [place] [place]. [facility], [place], [place]   3 years ago Pedal edema   [facility] [place] Of [place]. [facility], [place] HER[place], [place]   3 years ago Essential hypertension   [facility] [place] - [facility] Dept Of [place]. [facility] Delbert Clam, MD   4 years ago Pain and swelling of right lower leg   [facility] [place] - [facility] Dept Of [place]. [facility] Alec House, MD   4 years ago No-show for appointment   [facility] [place] - A Dept Of [place]. [facility] Vicci Barnie NOVAK, MD              Passed - Last Heart Rate in normal range    Pulse Readings from Last 1  Encounters:  02/26/23 65

## 2023-12-06 NOTE — Telephone Encounter (Signed)
 Requested medication (s) are due for refill today: Yes  Requested medication (s) are on the active medication list: Yes  Last refill:  09/24/23  Future visit scheduled: No  Notes to clinic:  Unable to refill per protocol, appointment needed.      Requested Prescriptions  Pending Prescriptions Disp Refills   lisinopril  (ZESTRIL ) 40 MG tablet 30 tablet 0    Sig: Take 1 tablet (40 mg total) by mouth daily.     Cardiovascular:  ACE Inhibitors Failed - 12/06/2023  5:25 PM      Failed - Cr in normal range and within 180 days    Creat  Date Value Ref Range Status  12/06/2022 0.76 0.50 - 1.05 mg/dL Final         Failed - K in normal range and within 180 days    Potassium  Date Value Ref Range Status  12/06/2022 4.0 3.5 - 5.3 mmol/L Final         Failed - Last BP in normal range    BP Readings from Last 1 Encounters:  02/26/23 (!) 159/97         Failed - Valid encounter within last 6 months    Recent Outpatient Visits           9 months ago Hyperlipidemia, unspecified hyperlipidemia type   [facility] [place] [place]. [facility] [place], [place], [place]   3 years ago Pedal edema   [facility] [place] - [facility] Dept Of [place]. [place], [place], [place]   3 years ago Essential hypertension   [facility] [place] - [facility] Dept Of [place]. [facility] Delbert Clam, [place]   4 years ago Pain and swelling of right lower leg   [facility] [place] - A Dept Of [place]. [facility] Alec House, MD   4 years ago No-show for appointment   [facility] [place] - A Dept Of . [facility] Vicci Barnie NOVAK, [place]              Passed - Patient is not pregnant

## 2023-12-08 ENCOUNTER — Encounter: Payer: Self-pay | Admitting: Radiology

## 2023-12-10 ENCOUNTER — Other Ambulatory Visit: Payer: Self-pay | Admitting: Internal Medicine

## 2023-12-10 ENCOUNTER — Other Ambulatory Visit

## 2023-12-10 DIAGNOSIS — I10 Essential (primary) hypertension: Secondary | ICD-10-CM

## 2023-12-10 DIAGNOSIS — G8929 Other chronic pain: Secondary | ICD-10-CM

## 2023-12-16 ENCOUNTER — Other Ambulatory Visit: Payer: Self-pay | Admitting: Internal Medicine

## 2023-12-16 DIAGNOSIS — G8929 Other chronic pain: Secondary | ICD-10-CM

## 2023-12-16 DIAGNOSIS — I10 Essential (primary) hypertension: Secondary | ICD-10-CM

## 2023-12-16 MED ORDER — METOPROLOL SUCCINATE ER 25 MG PO TB24: 25.0000 mg | ORAL_TABLET | Freq: Every day | ORAL | 0 refills | Status: DC

## 2023-12-16 MED ORDER — LISINOPRIL 40 MG PO TABS: 40.0000 mg | ORAL_TABLET | Freq: Every day | ORAL | 0 refills | Status: DC

## 2023-12-16 NOTE — Telephone Encounter (Signed)
 Copied from CRM [id number]. Topic: Clinical - Medication Refill >> Dec 16, 2023 10:42 AM Wess RAMAN wrote: Medication: gabapentin  (NEURONTIN ) 300 MG capsule  lisinopril  (ZESTRIL ) 40 MG tablet  metoprolol  succinate (TOPROL -XL) 25 MG 24 hr tablet    Has the patient contacted their pharmacy? Yes (Agent: If no, request that the patient contact the pharmacy for the refill. If patient does not wish to contact the pharmacy document the reason why and proceed with request.) (Agent: If yes, when and what did the pharmacy advise?) Pharmacy called  This is the patient's preferred pharmacy:  [facility] [place], [place] - [address] [place] [address] Phone: [phone] Fax: [phone]  Is this the correct pharmacy for this prescription? Yes If no, delete pharmacy and type the correct one.   Has the prescription been filled recently? Yes  Is the patient out of the medication? No  Has the patient been seen for an appointment in the last year OR does the patient have an upcoming appointment? Yes  Can we respond through MyChart? Yes  Agent: Please be advised that Rx refills may take up to 3 business days. We ask that you follow-up with your pharmacy.

## 2024-01-05 ENCOUNTER — Ambulatory Visit: Admitting: Podiatry

## 2024-01-05 ENCOUNTER — Ambulatory Visit: Payer: Self-pay

## 2024-01-05 NOTE — Telephone Encounter (Signed)
 FYI Only or Action Required?: FYI only for provider: appointment scheduled on tomorrow morning.  Patient was last seen in primary care on 02/26/2023 by Danton [place] HERO, PA-C.  Called Nurse Triage reporting Foot Swelling. HA  Symptoms began a year ago.  Interventions attempted: Nothing.  Symptoms are: unchanged.  Triage Disposition: See Physician Within 24 Hours  Patient/caregiver understands and will follow disposition?: Yes                  Copied from CRM #[id number]. Topic: Clinical - Red Word Triage >> Jan 05, 2024  4:14 PM Amy B wrote: Red Word that prompted transfer to Nurse Triage: Bilateral feet swelling with pain, unsteadiness, headache Reason for Disposition  [1] MODERATE leg swelling (e.g., swelling extends up to knees) AND [2] new-onset or getting worse  Answer Assessment - Initial Assessment Questions 1. ONSET: When did the swelling start? (e.g., minutes, hours, days)     Burned last year in December - last year 2. LOCATION: What part of the leg is swollen?  Are both legs swollen or just one leg?     Lower legs 3. SEVERITY: How bad is the swelling? (e.g., localized; mild, moderate, severe)     yes 4. REDNESS: Is there redness or signs of infection?     Turning dark 5. PAIN: Is the swelling painful to touch? If Yes, ask: How painful is it?   (Scale 1-10; mild, moderate or severe)     yes 6. FEVER: Do you have a fever? If Yes, ask: What is it, how was it measured, and when did it start?      no 7. CAUSE: What do you think is causing the leg swelling?     Unsure 8. MEDICAL HISTORY: Do you have a history of blood clots (e.g., DVT), cancer, heart failure, kidney disease, or liver failure?     no 9. RECURRENT SYMPTOM: Have you had leg swelling before? If Yes, ask: When was the last time? What happened that time?     The whole year 10. OTHER SYMPTOMS: Do you have any other symptoms? (e.g., chest pain, difficulty breathing)        Unsteadiness - legs  Protocols used: Leg Swelling and Edema-A-AH

## 2024-01-06 ENCOUNTER — Ambulatory Visit: Payer: Self-pay | Admitting: Nurse Practitioner

## 2024-01-08 ENCOUNTER — Other Ambulatory Visit: Payer: Self-pay | Admitting: Nurse Practitioner

## 2024-01-08 DIAGNOSIS — Z1231 Encounter for screening mammogram for malignant neoplasm of breast: Secondary | ICD-10-CM

## 2024-01-09 ENCOUNTER — Other Ambulatory Visit: Payer: Self-pay | Admitting: Internal Medicine

## 2024-01-09 DIAGNOSIS — I10 Essential (primary) hypertension: Secondary | ICD-10-CM

## 2024-01-09 DIAGNOSIS — G8929 Other chronic pain: Secondary | ICD-10-CM

## 2024-01-11 ENCOUNTER — Telehealth: Payer: Self-pay | Admitting: Internal Medicine

## 2024-01-11 NOTE — Telephone Encounter (Signed)
 Let pt know that I received request for refills on her medications which I have denied. I had refill several weeks ago with the understanding that she would keep appt that was scheduled in October but she no-showed. I have not seen her since 2019. If she is seeing some other primary care provider, she should reach out to them for refills. Otherwise, she needs to be seen for refills.

## 2024-01-12 NOTE — Telephone Encounter (Signed)
Called but no answer. Unable to LVM due to no VM set up.

## 2024-01-13 ENCOUNTER — Other Ambulatory Visit (HOSPITAL_BASED_OUTPATIENT_CLINIC_OR_DEPARTMENT_OTHER): Payer: Self-pay | Admitting: Nurse Practitioner

## 2024-01-13 DIAGNOSIS — Z780 Asymptomatic menopausal state: Secondary | ICD-10-CM

## 2024-01-13 NOTE — Telephone Encounter (Signed)
Called but no answer. Unable to LVM due to no VM set up.

## 2024-01-14 ENCOUNTER — Ambulatory Visit: Payer: Self-pay

## 2024-01-14 NOTE — Telephone Encounter (Signed)
Called but no answer. Unable to LVM due to no VM set up.

## 2024-01-14 NOTE — Telephone Encounter (Signed)
 Patient spoke to NT, see other triage note.

## 2024-01-14 NOTE — Telephone Encounter (Signed)
 Call disconnected prior to warm transfer. Nurse will attempt to contact patient.

## 2024-01-14 NOTE — Telephone Encounter (Signed)
°  FYI Only or Action Required?: FYI only for provider: urgent care advised.  Patient was last seen in primary care on 02/26/2023 by Danton Jon HERO, PA-C.  Called Nurse Triage reporting Foot Swelling and Back Pain.  Symptoms began several months ago. Worsening/increasing for several days  Interventions attempted: OTC medications: BC Powder and Rest, hydration, or home remedies.  Symptoms are: gradually worsening.  Triage Disposition: See Physician Within 24 Hours urgent care  Patient/caregiver understands and will follow disposition?: Yes   Copied from CRM #[id number]. Topic: Clinical - Red Word Triage >> Jan 14, 2024 12:13 PM Avram MATSU wrote: Red Word that prompted transfer to Nurse Triage: swollen feet, back hurts due to fall a few months ago.   ----------------------------------------------------------------------- From previous Reason for Contact - Scheduling: Patient/patient representative is calling to schedule an appointment. Refer to attachments for appointment information. Reason for Disposition  SEVERE ankle swelling (e.g., can't move swollen ankle at all)  Answer Assessment - Initial Assessment Questions Additional info: Patient shares she has chronic back pain current 8/10, uses BC powder with some relief. She is also experiencing severe ankle swelling which started about one year ago after burning them in a hot tub. Calling today because ankle swelling in increasing. She denies all other symptoms other than chronic back pain.  No appointments are available in clinic or regional clinics. Patient will proceed to urgent care for evaluation.    1. LOCATION: Which ankle is swollen? Where is the swelling?     Bilateral  2. ONSET: When did the swelling start?     One year ago-burned in hot tub 3. SWELLING: How bad is the swelling? Or, How large is it? (e.g., mild, moderate, severe; size of localized swelling)      moderate 4. PAIN: Is there any pain? If Yes,  ask: How bad is it? (Scale 0-10; or none, mild, moderate, severe)     8/10 Back pain 5. CAUSE: What do you think caused the ankle swelling?     Burned in hot tub one year ago, swelling never went down 6. OTHER SYMPTOMS: Do you have any other symptoms? (e.g., fever, chest pain, difficulty breathing, calf pain)     Denies chest pain, breathing difficulty,  7. PREGNANCY: Is there any chance you are pregnant? When was your last menstrual period?  Protocols used: Ankle Swelling-A-AH

## 2024-01-14 NOTE — Telephone Encounter (Signed)
 Copied from CRM #[id number]. Topic: Clinical - Red Word Triage >> Jan 14, 2024 12:07 PM Alfonso ORN wrote: Red Word that prompted transfer to Nurse Triage: pt feet are swollen.

## 2024-01-14 NOTE — Telephone Encounter (Signed)
 This RN attempted to contact patient, no answer, left voicemail message. Will place in Call Back folder.

## 2024-01-14 NOTE — Telephone Encounter (Signed)
 Lorraine Ryan but please also give her a future appt with me if she plans to re-establish with me as a PCP or she may wish to establish with the NP she has been scheduled with for later this mth. I have not seen her since 2019.

## 2024-01-15 NOTE — Telephone Encounter (Signed)
 Noted! Thank you

## 2024-01-16 ENCOUNTER — Ambulatory Visit: Admitting: Nurse Practitioner

## 2024-01-19 ENCOUNTER — Other Ambulatory Visit: Payer: Self-pay | Admitting: Internal Medicine

## 2024-01-19 DIAGNOSIS — I10 Essential (primary) hypertension: Secondary | ICD-10-CM

## 2024-01-21 ENCOUNTER — Other Ambulatory Visit: Payer: Self-pay

## 2024-01-21 ENCOUNTER — Ambulatory Visit: Admitting: Nurse Practitioner

## 2024-01-21 ENCOUNTER — Encounter: Payer: Self-pay | Admitting: Nurse Practitioner

## 2024-01-21 VITALS — BP 119/80 | HR 89 | Ht 66.0 in | Wt 256.0 lb

## 2024-01-21 DIAGNOSIS — I10 Essential (primary) hypertension: Secondary | ICD-10-CM

## 2024-01-21 DIAGNOSIS — R600 Localized edema: Secondary | ICD-10-CM

## 2024-01-21 DIAGNOSIS — Z23 Encounter for immunization: Secondary | ICD-10-CM | POA: Diagnosis not present

## 2024-01-21 DIAGNOSIS — G8929 Other chronic pain: Secondary | ICD-10-CM

## 2024-01-21 DIAGNOSIS — M545 Low back pain, unspecified: Secondary | ICD-10-CM

## 2024-01-21 MED ORDER — MELOXICAM 7.5 MG PO TABS: 7.5000 mg | ORAL_TABLET | Freq: Every day | ORAL | 0 refills | Status: AC

## 2024-01-21 MED ORDER — FUROSEMIDE 20 MG PO TABS: 20.0000 mg | ORAL_TABLET | Freq: Every day | ORAL | 0 refills | Status: AC

## 2024-01-21 NOTE — Progress Notes (Signed)
 Assessment & Plan:  Lorraine Ryan was seen today for leg swelling.  Diagnoses and all orders for this visit:  Pedal edema -     furosemide  (LASIX ) 20 MG tablet; Take 1 tablet (20 mg total) by mouth daily. -     Basic metabolic panel with GFR Likely due to fluid retention. Not compliant with compression socks and furosemide . - Resumed furosemide . - Instructed daily use of compression socks. - Sent furosemide  prescription via mail order.  Chronic midline low back pain without sciatica -     meloxicam  (MOBIC ) 7.5 MG tablet; Take 1 tablet (7.5 mg total) by mouth daily. Gabapentin  trial considered. Prefers meloxicam  over increasing gabapentin  dose. - Prescribed meloxicam  once daily with food. - Advised physical therapy or back specialist consultation if pain persists.   Primary hypertension Continue all antihypertensives as prescribed.  Reminded to bring in blood pressure log for follow  up appointment.  RECOMMENDATIONS: DASH/Mediterranean Diets are healthier choices for HTN.     Need for influenza vaccination -     Flu vaccine HIGH DOSE PF(Fluzone Trivalent)    Patient has been counseled on age-appropriate routine health concerns for screening and prevention. These are reviewed and up-to-date. Referrals have been placed accordingly. Immunizations are up-to-date or declined.    Subjective:   Chief Complaint  Patient presents with   Leg Swelling    Swelling in both legs for almost a year.    Lorraine Ryan 68 y.o. female presents to office today with concerns of BLE swelling in her feet and ankles.  She has a past medical history of Anxiety, Arthritis, Back pain, Bipolar disorder, Chronic headaches, Dementia, Depression, DM2, GERD, Hypertension, Neuromuscular disorder, Shortness of breath dyspnea, and Stroke     Essential Hypertension Blood pressure is well controlled.  Current medications include lisinopril -hydrochlorothiazide  20-25 mg daily, metoprolol  succinate 25 mg daily.   She ran out of furosemide . Denies chest pain,palpitations, lightheadedness, dizziness, headaches or visual disturbances. BP Readings from Last 3 Encounters:  01/21/24 119/80  02/26/23 (!) 159/97  12/26/22 130/78      Edema: Patient complains of bilateral lower extremity edema. The edema has been mild and moderate.  Onset of symptoms was several years ago, unchanged since that time. The edema is present all day. She is not currently taking lasix  and can not recall the reason.  The swelling has been aggravated by :Unknown as patient reports she has significantly reduced her salt intake.  Relieved by lasix  when taken and associated with shortness of breath and weight gain. Cardiac risk factors include dyslipidemia, hypertension, obesity (BMI >= 30 kg/m2) and sedentary lifestyle.  Will refill lasix  today.  She does not have any socks or compression stockings on today.  She is requesting a higher dose of gabapentin  for her back pain. States it is not strong enough to fully relieve her pain.  She has chronic low back pain with BMI 41    Review of Systems  Constitutional:  Negative for fever, malaise/fatigue and weight loss.  Respiratory: Negative.  Negative for cough and shortness of breath.   Cardiovascular:  Positive for leg swelling. Negative for chest pain and palpitations.  Musculoskeletal:  Positive for back pain. Negative for myalgias.  Neurological: Negative.  Negative for dizziness, focal weakness, seizures and headaches.  Psychiatric/Behavioral:  Positive for hallucinations (auditory). Negative for suicidal ideas.     Past Medical History:  Diagnosis Date   Anxiety    Arthritis    Back pain    Bipolar disorder (HCC)  Chronic headaches    Dementia (HCC)    Depression    Diabetes mellitus without complication (HCC)    GERD (gastroesophageal reflux disease)    Hypertension    No meds prescribed; states intermittent   Neuromuscular disorder (HCC)    Schizoaffective psychosis  (HCC) 06/27/2020   Shortness of breath dyspnea    Stroke [facility])    caused numbness in leg    Past Surgical History:  Procedure Laterality Date   ABDOMINAL HYSTERECTOMY     no cervix   COLONOSCOPY WITH PROPOFOL  N/A 04/18/2015   DOQ:zkuzmjwo hemorrhoids/diverticulosis sigmoid colon/   ESOPHAGOGASTRODUODENOSCOPY (EGD) WITH PROPOFOL  N/A 04/18/2015   SLF:web in the proximal esophagus/dilated/   GANGLION CYST EXCISION     LUMBAR LAMINECTOMY/DECOMPRESSION MICRODISCECTOMY N/A 02/08/2015   Procedure: L4-5 Decompression;  Surgeon: Oneil JAYSON Herald, MD;  Location: [facility] OR;  Service: Orthopedics;  Laterality: N/A;   POLYPECTOMY  04/18/2015   Procedure: POLYPECTOMY;  Surgeon: Margo LITTIE Haddock, MD;  Location[facility];  Service: Endoscopy;;  ascending colon polyp   TONSILLECTOMY      Family History  Problem Relation Age of Onset   Diabetes Mother    Hypertension Mother    Heart disease Other        No family history   Breast cancer Sister        diagnosed in her 34's   Anxiety disorder Sister    Depression Sister    Anxiety disorder Brother    Depression Brother    Anxiety disorder Brother    Depression Brother    Cancer Maternal Grandmother        unknown type   Colon cancer Neg Hx     Social History Reviewed with no changes to be made today.   Outpatient Medications Prior to Visit  Medication Sig Dispense Refill   gabapentin  (NEURONTIN ) 300 MG capsule TAKE ONE CAPSULE (300 MG TOTAL) BY MOUTH TWICE DAILY AT 9AM & 12PM IN THE MORNING AND AT NOON and TAKE TWO CAPSULES (600 MG TOTAL) AT BEDTIME DAILY AT 9 PM 120 capsule 0   lisinopril  (ZESTRIL ) 40 MG tablet TAKE ONE TABLET (40 MG TOTAL) BY MOUTH DAILY AT 9 AM MUST HAVE OFFICE VISIT FOR REFILLS (Patient not taking: Reported on 01/21/2024) 30 tablet 1   metoprolol  succinate (TOPROL -XL) 25 MG 24 hr tablet Take 1 tablet (25 mg total) by mouth daily. Must have office visit for refills (Patient not taking: Reported on 01/21/2024) 30 tablet 0   No  facility-administered medications prior to visit.    Allergies[1]     Objective:    BP 119/80 (BP Location: Left Arm, Patient Position: Sitting, Cuff Size: Large)   Pulse 89   Ht 5' 6 (1.676 m)   Wt 256 lb (116.1 kg)   SpO2 98%   BMI 41.32 kg/m  Wt Readings from Last 3 Encounters:  01/21/24 256 lb (116.1 kg)  12/01/23 284 lb 9.6 oz (129.1 kg)  09/03/23 284 lb 9.6 oz (129.1 kg)    Physical Exam Vitals and nursing note reviewed.  Constitutional:      Appearance: She is well-developed.  HENT:     Head: Normocephalic and atraumatic.  Cardiovascular:     Rate and Rhythm: Normal rate and regular rhythm.     Heart sounds: Normal heart sounds. No murmur heard.    No friction rub. No gallop.  Pulmonary:     Effort: Pulmonary effort is normal. No tachypnea or respiratory distress.  Breath sounds: Normal breath sounds. No decreased breath sounds, wheezing, rhonchi or rales.  Chest:     Chest wall: No tenderness.  Musculoskeletal:        General: Normal range of motion.     Cervical back: Normal range of motion.     Right lower leg: Swelling present. No edema.     Left lower leg: Swelling present. No edema.     Right ankle: Swelling present.     Left ankle: Swelling present.     Right foot: Swelling present.     Left foot: Swelling present.  Skin:    General: Skin is warm and dry.  Neurological:     Mental Status: She is alert and oriented to person, place, and time.     Coordination: Coordination normal.  Psychiatric:        Behavior: Behavior normal. Behavior is cooperative.        Thought Content: Thought content normal.        Judgment: Judgment normal.          Patient has been counseled extensively about nutrition and exercise as well as the importance of adherence with medications and regular follow-up. The patient was given clear instructions to go to ER or return to medical center if symptoms don't improve, worsen or new problems develop. The patient  verbalized understanding.   Follow-up: Return for f/U WITH PCP IN 6 weeks for BLE edema .   Haze LELON Servant, FNP-BC [facility] and [facility] [place], [place] [phone]   01/21/2024, 4:05 PM     [1]  Allergies Allergen Reactions   Codeine  Other (See Comments)    Headache   Oxycontin  [Oxycodone ] Nausea Only and Palpitations   Prednisone  Other (See Comments)    Headache   Ultram  [Tramadol ] Nausea Only

## 2024-01-22 ENCOUNTER — Ambulatory Visit: Admitting: Primary Care

## 2024-01-22 LAB — BASIC METABOLIC PANEL WITH GFR
BUN/Creatinine Ratio: 10 — ABNORMAL LOW (ref 12–28)
BUN: 8 mg/dL (ref 8–27)
CO2: 26 mmol/L (ref 20–29)
Calcium: 10.1 mg/dL (ref 8.7–10.3)
Chloride: 103 mmol/L (ref 96–106)
Creatinine, Ser: 0.82 mg/dL (ref 0.57–1.00)
Glucose: 88 mg/dL (ref 70–99)
Potassium: 3.8 mmol/L (ref 3.5–5.2)
Sodium: 144 mmol/L (ref 134–144)
eGFR: 78 mL/min/1.73 (ref >59)

## 2024-01-23 ENCOUNTER — Other Ambulatory Visit: Payer: Self-pay | Admitting: Internal Medicine

## 2024-01-23 DIAGNOSIS — I10 Essential (primary) hypertension: Secondary | ICD-10-CM

## 2024-01-23 NOTE — Telephone Encounter (Unsigned)
 Copied from CRM #[id number]. Topic: Clinical - Medication Refill >> Jan 23, 2024  9:38 AM Jayma L wrote: Medication: lisinopril  (ZESTRIL ) 40 MG tablet   Has the patient contacted their pharmacy? Yes (Agent: If no, request that the patient contact the pharmacy for the refill. If patient does not wish to contact the pharmacy document the reason why and proceed with request.) (Agent: If yes, when and what did the pharmacy advise?)  This is the patient's preferred pharmacy:  [facility] - [place], [place] - [address] [place] [address] Phone: [phone] Fax: [phone]  Is this the correct pharmacy for this prescription? Yes If no, delete pharmacy and type the correct one.   Has the prescription been filled recently? No  Is the patient out of the medication? No  Has the patient been seen for an appointment in the last year OR does the patient have an upcoming appointment? Yes  Can we respond through MyChart? Yes  Agent: Please be advised that Rx refills may take up to 3 business days. We ask that you follow-up with your pharmacy.

## 2024-01-24 ENCOUNTER — Ambulatory Visit: Payer: Self-pay | Admitting: Nurse Practitioner

## 2024-01-27 NOTE — Telephone Encounter (Signed)
 Rx 01/19/24 #30 1RF- duplicate request Requested Prescriptions  Pending Prescriptions Disp Refills   lisinopril  (ZESTRIL ) 40 MG tablet 30 tablet 1     Cardiovascular:  ACE Inhibitors Failed - 01/27/2024 12:50 PM      Failed - Valid encounter within last 6 months    Recent Outpatient Visits           6 days ago Pedal edema   [facility] - [facility] Dept Of [place]. [facility] Theotis Haze ORN, NP   11 months ago Hyperlipidemia, unspecified hyperlipidemia type   [facility] [facility] - [facility] Dept Of [place]. [facility] [place], Jon HERO, [place]   3 years ago Pedal edema   [facility] [facility] [place] - [facility] Dept Of [place]. [facility], Jon HERO, [place]   4 years ago Essential hypertension   [facility] [place] - [facility] Dept Of [place]. [facility] Delbert Clam, MD   4 years ago Pain and swelling of right lower leg   [place] [facility] [facility] [place] - [facility] Dept Of [place]. [facility] [place], [place], MD              Passed - Cr in normal range and within 180 days    Creat  Date Value Ref Range Status  12/06/2022 0.76 0.50 - 1.05 mg/dL Final   Creatinine, Ser  Date Value Ref Range Status  01/21/2024 0.82 0.57 - 1.00 mg/dL Final         Passed - K in normal range and within 180 days    Potassium  Date Value Ref Range Status  01/21/2024 3.8 3.5 - 5.2 mmol/L Final         Passed - Patient is not pregnant      Passed - Last BP in normal range    BP Readings from Last 1 Encounters:  01/21/24 119/80

## 2024-01-27 NOTE — Telephone Encounter (Signed)
 Noted

## 2024-02-09 DIAGNOSIS — M545 Low back pain, unspecified: Secondary | ICD-10-CM

## 2024-02-09 DIAGNOSIS — I10 Essential (primary) hypertension: Secondary | ICD-10-CM

## 2024-02-10 NOTE — Telephone Encounter (Signed)
 Requested Prescriptions  Pending Prescriptions Disp Refills   lisinopril  (ZESTRIL ) 40 MG tablet [Pharmacy Med Name: lisinopril  40 mg tablet] 90 tablet 0    Sig: TAKE ONE TABLET (40 MG TOTAL) BY MOUTH DAILY AT 9 AM     Cardiovascular:  ACE Inhibitors Failed - 02/10/2024  4:09 PM      Failed - Valid encounter within last 6 months    Recent Outpatient Visits           2 weeks ago Pedal edema   [facility] [facility] Dept Of [place]. [facility] Theotis Haze ORN, NP   11 months ago Hyperlipidemia, unspecified hyperlipidemia type   [facility] - A Dept Of [place]. [facility], [place] HERO, [place]   3 years ago Pedal edema   [place] [place] - A Dept Of [place]. [facility], [place] HERO, [place]   4 years ago Essential hypertension   [place] [facility] [place] - A Dept Of [place]. [facility] Delbert Clam, MD   4 years ago Pain and swelling of right lower leg   [place] [place] [facility] [place] - A Dept Of [place]. [facility] Fulp, Max [place], MD              Passed - Cr in normal range and within 180 days    Creat  Date Value Ref Range Status  12/06/2022 0.76 0.50 - 1.05 mg/dL Final   Creatinine, Ser  Date Value Ref Range Status  01/21/2024 0.82 0.57 - 1.00 mg/dL Final         Passed - K in normal range and within 180 days    Potassium  Date Value Ref Range Status  01/21/2024 3.8 3.5 - 5.2 mmol/L Final         Passed - Patient is not pregnant      Passed - Last BP in normal range    BP Readings from Last 1 Encounters:  01/21/24 119/80          metoprolol  succinate (TOPROL -XL) 25 MG 24 hr tablet [Pharmacy Med Name: metoprolol  succinate ER 25 mg tablet,extended release 24 hr] 90 tablet 0    Sig: TAKE ONE TABLET (25 MG TOTAL) BY MOUTH DAILY AT 9 AM     Cardiovascular:  Beta Blockers Failed - 02/10/2024  4:09 PM      Failed - Valid encounter within last 6 months     Recent Outpatient Visits           2 weeks ago Pedal edema   [place] [facility] [place] [facility] [place]. [facility] Theotis Haze ORN, NP   11 months ago Hyperlipidemia, unspecified hyperlipidemia type   [place] [facility] - [facility] Dept Of [place]. [facility] [place], Jon HERO, [place]   3 years ago Pedal edema   [place] [facility] [place] - A Dept Of [place]. [facility], Jon HERO, [place]   4 years ago Essential hypertension   [place] [facility] [place] - A Dept Of . [facility] Delbert Clam, MD   4 years ago Pain and swelling of right lower leg   [place] [facility] [place] - A Dept Of . [facility] Alec House, MD              Passed - Last  BP in normal range    BP Readings from Last 1 Encounters:  01/21/24 119/80         Passed - Last Heart Rate in normal range    Pulse Readings from Last 1 Encounters:  01/21/24 89          gabapentin  (NEURONTIN ) 300 MG capsule [Pharmacy Med Name: gabapentin  300 mg capsule] 360 capsule 0    Sig: TAKE ONE CAPSULE (300 MG TOTAL) BY MOUTH TWICE DAILY AT 9AM & 12PM IN THE MORNING AND AT NOON and TAKE TWO CAPSULES (600 MG TOTAL) BY MOUTH AT BEDTIME DAILY AT 9 PM     Neurology: Anticonvulsants - gabapentin  Passed - 02/10/2024  4:09 PM      Passed - Cr in normal range and within 360 days    Creat  Date Value Ref Range Status  12/06/2022 0.76 0.50 - 1.05 mg/dL Final   Creatinine, Ser  Date Value Ref Range Status  01/21/2024 0.82 0.57 - 1.00 mg/dL Final         Passed - Completed PHQ-2 or PHQ-9 in the last 360 days      Passed - Valid encounter within last 12 months    Recent Outpatient Visits           2 weeks ago Pedal edema   [place] [facility] [place] - [facility] Dept Of [place]. [facility] Theotis Haze ORN, NP   11 months ago Hyperlipidemia, unspecified hyperlipidemia type   [facility] [facility] - A Dept  Of [place]. [facility] [place], Jon HERO, [place]   3 years ago Pedal edema   [place] [facility] [place] - A Dept Of [place]. [facility], Jon HERO, [place]   4 years ago Essential hypertension   [place] [facility] [place] - [facility] Dept Of [place]. [facility] Delbert Clam, MD   4 years ago Pain and swelling of right lower leg   [place] [facility] [place] - A Dept Of [place]. [facility] Alec House, MD

## 2024-02-11 ENCOUNTER — Ambulatory Visit: Payer: Self-pay

## 2024-02-11 ENCOUNTER — Ambulatory Visit

## 2024-02-11 NOTE — Telephone Encounter (Signed)
 FYI Only or Action Required?: FYI only for provider: appointment scheduled on 02/13/24.  Patient was last seen in primary care on 01/21/2024 by Theotis Haze ORN, NP.  Called Nurse Triage reporting Leg Pain.  Symptoms began several weeks ago.  Interventions attempted: Other: elevation; compression socks.  Symptoms are: gradually worsening.  Triage Disposition: See HCP Within 4 Hours (Or PCP Triage)  Patient/caregiver understands and will follow disposition?: Yes    Copied from CRM #[id number]. Topic: Clinical - Red Word Triage >> Feb 11, 2024 10:44 AM Lorraine Ryan wrote: [facility] that prompted transfer to Nurse Triage: Worsening leg swelling, pain now in right leg.   Reason for Disposition  [1] SEVERE pain (e.g., excruciating, unable to do any normal activities) AND [2] not improved after 2 hours of pain medicine  Answer Assessment - Initial Assessment Questions Pt called to reschedule appt as she cannot come to clinic. Pt states she was being seen for bilateral leg swelling and pain but she lives on the second floor and can't get down her stairs. Pt states she is able to walk and is elevating her feet, wearing compression socks. Pt uses mail delivery pharmacy and has not received furosemide  at this time so no medication on board for swelling. Pt reports taking Bcs for pain. Discussed with red flag symptoms of discoloration with legs, coolness, inability to feel toes/feet, swelling causing weeping or pooling of fluid in the legs would all warrant higher level of care and pt should call EMS d/t living on second floor. Pt voiced understanding.     1. ONSET: When did the pain start?      Ongoing; pt seen 01/21/24 for pedal edema   2. LOCATION: Where is the pain located?      Bilateral lower legs and ankles   3. PAIN: How bad is the pain?    (Scale 1-10; or mild, moderate, severe)     10   4. WORK OR EXERCISE: Has there been any recent work or exercise that involved this part of  the body?      No; pt reports living on second floor and walking up steps   5. CAUSE: What do you think is causing the leg pain?     Unsure   6. OTHER SYMPTOMS: Do you have any other symptoms? (e.g., chest pain, back pain, breathing difficulty, swelling, rash, fever, numbness, weakness)     Back pain  Protocols used: Leg Pain-A-AH

## 2024-02-13 ENCOUNTER — Ambulatory Visit: Admitting: Internal Medicine

## 2024-02-14 ENCOUNTER — Telehealth: Payer: Self-pay | Admitting: Internal Medicine

## 2024-02-14 NOTE — Telephone Encounter (Signed)
 Please let this pt know that she is not under my care. I last saw her 2019 then she requested to change provider to [facility] whom she then saw several times. I have been receiving refill requests on medications which I discontinued doing unless she was seen by me to re-establish care as a new pt. She no-showed 3 appts with me since 11/2023.  She recently came in and saw [employer]. So if she wants to establish with Theotis, please do so.

## 2024-02-16 NOTE — Telephone Encounter (Signed)
 Noted! Thank you

## 2024-02-16 NOTE — Telephone Encounter (Signed)
 New appt with whom? She needs to est with new provider. She left me in 2019 and had est with Zelda. Then left for a few years then came back to us . I think she remembers who I am and does not wish to re-est with me. So please establish her with someone else.

## 2024-02-17 ENCOUNTER — Ambulatory Visit: Payer: Self-pay | Admitting: Nurse Practitioner

## 2024-02-17 ENCOUNTER — Ambulatory Visit: Payer: Self-pay

## 2024-02-17 NOTE — Telephone Encounter (Signed)
 FYI Only or Action Required?: FYI only for provider: appointment scheduled on 02/17/24.  Patient was last seen in primary care on 01/21/2024 by Theotis Haze ORN, NP.  Called Nurse Triage reporting Appointment.  Symptoms began several months ago.  Interventions attempted: Nothing.  Symptoms are: gradually worsening.  Triage Disposition: See PCP Within 2 Weeks  Patient/caregiver understands and will follow disposition?: Yes     Copied from CRM [id number]. Topic: Clinical - Red Word Triage >> Feb 17, 2024 11:03 AM Mesmerise C wrote: [facility] that prompted transfer to Nurse Triage: Patient's feet have been swelling and back pain along with headaches states been ongoing awhile   Reason for Disposition  Requesting regular office appointment  Answer Assessment - Initial Assessment Questions 1. REASON FOR CALL: What is the main reason for your call? or How can I best help you?     Pt calling in report back pain and swelling in bilateral ankles and feet. Pt has been triaged multiple times for concerns then scheduled with Dr. Vicci, pt has left Dr. Vicci and re-established with Z. Theotis per my chart communications. Pt was advised to contact clinic to scheduled. Appt cancellation today and pt agreeable to come into clinic. Appointment scheduled for evaluation. Patient agrees with plan of care, and will call back if anything changes, or if symptoms worsen.  Protocols used: Information Only Call - No Triage-A-AH

## 2024-02-17 NOTE — Telephone Encounter (Signed)
 Noted. Pt has appointment today to discuss.

## 2024-02-27 ENCOUNTER — Ambulatory Visit: Admitting: Internal Medicine

## 2024-03-10 ENCOUNTER — Ambulatory Visit (INDEPENDENT_AMBULATORY_CARE_PROVIDER_SITE_OTHER)

## 2024-03-10 ENCOUNTER — Ambulatory Visit: Admitting: Podiatry

## 2024-03-10 ENCOUNTER — Encounter: Payer: Self-pay | Admitting: Podiatry

## 2024-03-10 VITALS — Ht 66.0 in | Wt 256.0 lb

## 2024-03-10 DIAGNOSIS — M7751 Other enthesopathy of right foot: Secondary | ICD-10-CM | POA: Diagnosis not present

## 2024-03-10 DIAGNOSIS — M7752 Other enthesopathy of left foot: Secondary | ICD-10-CM

## 2024-03-10 DIAGNOSIS — Q6651 Congenital pes planus, right foot: Secondary | ICD-10-CM

## 2024-03-10 DIAGNOSIS — B351 Tinea unguium: Secondary | ICD-10-CM

## 2024-03-10 DIAGNOSIS — Q6652 Congenital pes planus, left foot: Secondary | ICD-10-CM

## 2024-03-10 NOTE — Progress Notes (Signed)
 "  Chief Complaint  Patient presents with   Nail Problem    Pt is here for [facility], And bilateral foot pain and swelling.    HPI: 69 y.o. female PMHx diabetes mellitus, HTN, chronic lower extremity edema presenting for routine footcare as well as follow-up evaluation of bilateral foot pain with swelling.  Past Medical History:  Diagnosis Date   Anxiety    Arthritis    Back pain    Bipolar disorder (HCC)    Chronic headaches    Dementia (HCC)    Depression    Diabetes mellitus without complication (HCC)    GERD (gastroesophageal reflux disease)    Hypertension    No meds prescribed; states intermittent   Neuromuscular disorder (HCC)    Schizoaffective psychosis (HCC) 06/27/2020   Shortness of breath dyspnea    Stroke (HCC)    caused numbness in leg    Past Surgical History:  Procedure Laterality Date   ABDOMINAL HYSTERECTOMY     no cervix   COLONOSCOPY WITH PROPOFOL  N/A 04/18/2015   DOQ:zkuzmjwo hemorrhoids/diverticulosis sigmoid colon/   ESOPHAGOGASTRODUODENOSCOPY (EGD) WITH PROPOFOL  N/A 04/18/2015   SLF:web in the proximal esophagus/dilated/   GANGLION CYST EXCISION     LUMBAR LAMINECTOMY/DECOMPRESSION MICRODISCECTOMY N/A 02/08/2015   Procedure: L4-5 Decompression;  Surgeon: Oneil JAYSON Herald, MD;  [facility];  Service: Orthopedics;  Laterality: N/A;   POLYPECTOMY  04/18/2015   Procedure: POLYPECTOMY;  Surgeon: Margo LITTIE Haddock, MD;  Location: [facility] SUITE;  Service: Endoscopy;;  ascending colon polyp   TONSILLECTOMY      Allergies  Allergen Reactions   Codeine  Other (See Comments)    Headache   Oxycontin  [Oxycodone ] Nausea Only and Palpitations   Prednisone  Other (See Comments)    Headache   Ultram  [Tramadol ] Nausea Only     Physical Exam: General: The patient is alert and oriented x3 in no acute distress.  Dermatology: Hyperkeratotic dystrophic nails noted 1-5 bilateral  Vascular: Palpable pedal pulses bilaterally. Capillary refill within normal limits.  Heavy  edema noted bilateral, right significantly greater than the left  VAS US  LOWER EXTREMITY VENOUS (DVT) (Accession [id number]) (Order [id number]) Vascular Ultrasound Date: 12/01/2023 Department: [facility]ound at [facility] A Dept. of [place]. [facility]  Summary:  RIGHT: There is no evidence of deep vein thrombosis in the lower extremity. No cystic structure found in the popliteal fossa.  LEFT: No evidence of common femoral vein obstruction.   Neurological: Light touch and protective threshold diminished  Musculoskeletal Exam: With weightbearing there is collapse of the medial longitudinal arch of the foot and degenerative changes noted consistent with an adult acquired flatfoot deformity bilateral.  Associated generalized tenderness throughout palpation as well  Radiographic Exam B/L feet 03/10/2024:  No osseous irregularities.  Flatfoot deformity noted with moderate arthritic degenerative changes.  Impression: Pes planus bilateral  Assessment/Plan of Care: 1.  Diabetes mellitus with peripheral polyneuropathy 2.  Chronic lower extremity edema bilateral 3.  Pain due to onychomycosis of toenails bilateral 4.  Pes planovalgus deformity with DJD bilateral  -Patient evaluated.  X-rays reviewed - Mechanical debridement of nails 1-5 bilateral was performed using a nail nipper without incident or bleeding -Due to the flatfoot deformity and generalized pain throughout the feet with walking and ambulation I do believe the patient would benefit from custom molded orthotics to support the medial longitudinal arch of the foot and potentially alleviate a lot of the patient's pain and achiness.  Today the patient was scanned  for custom molded orthotics -Return to clinic orthotics pickup      Thresa EMERSON Sar, DPM [facility]  Dr. Thresa EMERSON Sar, DPM    [address] [place], [place] [address]                Office [phone]  Fax  [phone]     "

## 2024-03-17 ENCOUNTER — Ambulatory Visit
